# Patient Record
Sex: Male | Born: 1940
Health system: Southern US, Community
[De-identification: ages and names within clinical notes are randomized; demographics above are authoritative.]

## PROBLEM LIST (undated history)

## (undated) DIAGNOSIS — N2889 Other specified disorders of kidney and ureter: Secondary | ICD-10-CM

## (undated) DIAGNOSIS — E669 Obesity, unspecified: Secondary | ICD-10-CM

## (undated) DIAGNOSIS — N289 Disorder of kidney and ureter, unspecified: Secondary | ICD-10-CM

## (undated) DIAGNOSIS — M1990 Unspecified osteoarthritis, unspecified site: Secondary | ICD-10-CM

## (undated) DIAGNOSIS — I10 Essential (primary) hypertension: Secondary | ICD-10-CM

## (undated) DIAGNOSIS — R519 Headache, unspecified: Secondary | ICD-10-CM

## (undated) DIAGNOSIS — M359 Systemic involvement of connective tissue, unspecified: Secondary | ICD-10-CM

## (undated) DIAGNOSIS — R972 Elevated prostate specific antigen [PSA]: Secondary | ICD-10-CM

## (undated) DIAGNOSIS — G629 Polyneuropathy, unspecified: Secondary | ICD-10-CM

## (undated) DIAGNOSIS — C649 Malignant neoplasm of unspecified kidney, except renal pelvis: Secondary | ICD-10-CM

## (undated) DIAGNOSIS — E119 Type 2 diabetes mellitus without complications: Secondary | ICD-10-CM

## (undated) DIAGNOSIS — Z9889 Other specified postprocedural states: Secondary | ICD-10-CM

## (undated) DIAGNOSIS — E785 Hyperlipidemia, unspecified: Secondary | ICD-10-CM

## (undated) DIAGNOSIS — R319 Hematuria, unspecified: Secondary | ICD-10-CM

## (undated) DIAGNOSIS — R112 Nausea with vomiting, unspecified: Secondary | ICD-10-CM

## (undated) DIAGNOSIS — R51 Headache: Secondary | ICD-10-CM

## (undated) HISTORY — DX: Essential (primary) hypertension: I10

## (undated) HISTORY — PX: APPENDECTOMY: SHX54

## (undated) HISTORY — DX: Obesity, unspecified: E66.9

## (undated) HISTORY — DX: Unspecified osteoarthritis, unspecified site: M19.90

## (undated) HISTORY — DX: Elevated prostate specific antigen (PSA): R97.20

## (undated) HISTORY — DX: Malignant neoplasm of unspecified kidney, except renal pelvis: C64.9

## (undated) HISTORY — DX: Hematuria, unspecified: R31.9

## (undated) HISTORY — DX: Hyperlipidemia, unspecified: E78.5

## (undated) HISTORY — DX: Type 2 diabetes mellitus without complications: E11.9

---

## 2008-04-20 ENCOUNTER — Ambulatory Visit: Payer: Self-pay | Admitting: Gastroenterology

## 2013-12-20 ENCOUNTER — Ambulatory Visit: Payer: Self-pay | Admitting: Obstetrics and Gynecology

## 2014-06-23 ENCOUNTER — Ambulatory Visit: Payer: Self-pay | Admitting: Urology

## 2014-06-23 DIAGNOSIS — N2889 Other specified disorders of kidney and ureter: Secondary | ICD-10-CM | POA: Diagnosis not present

## 2014-06-23 DIAGNOSIS — K5730 Diverticulosis of large intestine without perforation or abscess without bleeding: Secondary | ICD-10-CM | POA: Diagnosis not present

## 2014-06-23 DIAGNOSIS — I700 Atherosclerosis of aorta: Secondary | ICD-10-CM | POA: Diagnosis not present

## 2014-06-28 DIAGNOSIS — N400 Enlarged prostate without lower urinary tract symptoms: Secondary | ICD-10-CM | POA: Diagnosis not present

## 2014-06-28 DIAGNOSIS — R972 Elevated prostate specific antigen [PSA]: Secondary | ICD-10-CM | POA: Diagnosis not present

## 2014-06-28 DIAGNOSIS — E669 Obesity, unspecified: Secondary | ICD-10-CM | POA: Diagnosis not present

## 2014-06-28 DIAGNOSIS — N281 Cyst of kidney, acquired: Secondary | ICD-10-CM | POA: Diagnosis not present

## 2014-08-18 DIAGNOSIS — I10 Essential (primary) hypertension: Secondary | ICD-10-CM | POA: Diagnosis not present

## 2014-08-18 DIAGNOSIS — E785 Hyperlipidemia, unspecified: Secondary | ICD-10-CM | POA: Diagnosis not present

## 2014-08-18 DIAGNOSIS — E1140 Type 2 diabetes mellitus with diabetic neuropathy, unspecified: Secondary | ICD-10-CM | POA: Diagnosis not present

## 2014-10-24 ENCOUNTER — Other Ambulatory Visit: Payer: Self-pay | Admitting: Family Medicine

## 2014-10-24 DIAGNOSIS — N2889 Other specified disorders of kidney and ureter: Secondary | ICD-10-CM

## 2014-11-18 DIAGNOSIS — E1159 Type 2 diabetes mellitus with other circulatory complications: Secondary | ICD-10-CM | POA: Insufficient documentation

## 2014-11-18 DIAGNOSIS — E0821 Diabetes mellitus due to underlying condition with diabetic nephropathy: Secondary | ICD-10-CM | POA: Insufficient documentation

## 2014-11-18 DIAGNOSIS — E1140 Type 2 diabetes mellitus with diabetic neuropathy, unspecified: Secondary | ICD-10-CM | POA: Insufficient documentation

## 2014-11-18 DIAGNOSIS — E1169 Type 2 diabetes mellitus with other specified complication: Secondary | ICD-10-CM | POA: Insufficient documentation

## 2014-11-18 DIAGNOSIS — E785 Hyperlipidemia, unspecified: Secondary | ICD-10-CM | POA: Insufficient documentation

## 2014-11-18 DIAGNOSIS — I10 Essential (primary) hypertension: Secondary | ICD-10-CM | POA: Insufficient documentation

## 2014-11-21 ENCOUNTER — Encounter: Payer: Self-pay | Admitting: Family Medicine

## 2014-11-21 ENCOUNTER — Ambulatory Visit (INDEPENDENT_AMBULATORY_CARE_PROVIDER_SITE_OTHER): Payer: Medicare Other | Admitting: Family Medicine

## 2014-11-21 VITALS — BP 157/77 | HR 64 | Temp 98.6°F | Ht 68.0 in | Wt 230.0 lb

## 2014-11-21 DIAGNOSIS — E119 Type 2 diabetes mellitus without complications: Secondary | ICD-10-CM

## 2014-11-21 DIAGNOSIS — I10 Essential (primary) hypertension: Secondary | ICD-10-CM

## 2014-11-21 DIAGNOSIS — E785 Hyperlipidemia, unspecified: Secondary | ICD-10-CM | POA: Diagnosis not present

## 2014-11-21 DIAGNOSIS — Z0000 Encounter for general adult medical examination without abnormal findings: Secondary | ICD-10-CM | POA: Diagnosis not present

## 2014-11-21 MED ORDER — AMLODIPINE BESYLATE 5 MG PO TABS: 5.0000 mg | ORAL_TABLET | Freq: Every day | ORAL | Status: DC

## 2014-11-21 NOTE — Assessment & Plan Note (Signed)
The current medical regimen is effective;  continue present plan and medications.  

## 2014-11-21 NOTE — Progress Notes (Signed)
BP 157/77 mmHg  Pulse 64  Temp(Src) 98.6 F (37 C)  Ht 5\' 8"  (1.727 m)  Wt 230 lb (104.327 kg)  BMI 34.98 kg/m2  SpO2 97%   Subjective:    Patient ID: Jason Contreras, male    DOB: 01-Apr-1941, 74 y.o.   MRN: [id number]  HPI: Jason Contreras is a 74 y.o. male  Chief Complaint  Patient presents with  . Annual Exam   Patient taking medications daily with no side effects and low blood sugar spells. Lipitor with no side effects or problems. Has been battling with poison ivy on his legs for the last 3 weeks may be getting better. Been taking blood pressure medicine every day on has not been checking at home. Gave up soft drinks and is feeling much better with more energy. Diabetic peripheral neuropathy well controlled with gabapentin.  Relevant past medical, surgical, family and social history reviewed and updated as indicated. Interim medical history since our last visit reviewed. Allergies and medications reviewed and updated.  Review of Systems  Constitutional: Negative.   HENT: Negative.   Eyes: Negative.   Respiratory: Negative.   Cardiovascular: Negative.   Endocrine: Negative.   Musculoskeletal: Negative.   Skin: Negative.   Allergic/Immunologic: Negative.   Neurological: Negative.   Hematological: Negative.   Psychiatric/Behavioral: Negative.     Per HPI unless specifically indicated above     Objective:    BP 157/77 mmHg  Pulse 64  Temp(Src) 98.6 F (37 C)  Ht 5\' 8"  (1.727 m)  Wt 230 lb (104.327 kg)  BMI 34.98 kg/m2  SpO2 97%  Wt Readings from Last 3 Encounters:  11/21/14 230 lb (104.327 kg)  08/18/14 236 lb (107.049 kg)    Physical Exam  Constitutional: He is oriented to person, place, and time. He appears well-developed and well-nourished.  HENT:  Head: Normocephalic.  Right Ear: External ear normal.  Left Ear: External ear normal.  Nose: Nose normal.  Eyes: Conjunctivae and EOM are normal. Pupils are equal, round, and reactive to light.  Neck:  Normal range of motion. Neck supple. No thyromegaly present.  Cardiovascular: Normal rate, regular rhythm, normal heart sounds and intact distal pulses.   Pulmonary/Chest: Effort normal and breath sounds normal.  Abdominal: Soft. Bowel sounds are normal. There is no splenomegaly or hepatomegaly.  Genitourinary:  Patient going to urology and has had urology workup. Still has ongoing workup for kidney tumor.  Musculoskeletal: Normal range of motion.  Lymphadenopathy:    He has no cervical adenopathy.  Neurological: He is alert and oriented to person, place, and time. He has normal reflexes.  Skin: Skin is warm and dry.  Changes on legs chest arms from poison ivy  Psychiatric: He has a normal mood and affect. His behavior is normal. Judgment and thought content normal.    No results found for this or any previous visit.    Assessment & Plan:   Problem List Items Addressed This Visit      Cardiovascular and Mediastinum   Hypertension - Primary    Discuss elevated blood pressure for control both here and at home. Will add amlodipine 5 mg discussed potential side effects with leg edema recheck blood pressure 1 month or so      Relevant Medications   amLODipine (NORVASC) 5 MG tablet   Other Relevant Orders   Basic metabolic panel     Endocrine   Diabetes mellitus without complication    The current medical regimen is effective;  continue present plan and medications.       Relevant Orders   Hemoglobin A1c   Bayer DCA Hb A1c Waived     Other   Hyperlipidemia    ...........Marland KitchenMarland KitchenThe current medical regimen is effective;  continue present plan and medications.       Relevant Medications   amLODipine (NORVASC) 5 MG tablet    Other Visit Diagnoses    PE (physical exam), annual        Relevant Orders    Comprehensive metabolic panel    CBC with Differential/Platelet    Urinalysis, Routine w reflex microscopic (not at [facility])    TSH    Lipid panel        Follow up  plan: Return in about 3 months (around 02/21/2015), or if symptoms worsen or fail to improve, for med check.

## 2014-11-21 NOTE — Assessment & Plan Note (Signed)
Discuss elevated blood pressure for control both here and at home. Will add amlodipine 5 mg discussed potential side effects with leg edema recheck blood pressure 1 month or so

## 2014-11-21 NOTE — Addendum Note (Signed)
Addended byGolden Pop on: 11/21/2014 04:39 PM   Modules accepted: Level of Service, SmartSet

## 2014-11-22 LAB — CBC WITH DIFFERENTIAL/PLATELET
BASOS ABS: 0 10*3/uL (ref 0.0–0.2)
Basos: 0 %
EOS (ABSOLUTE): 0.5 10*3/uL — ABNORMAL HIGH (ref 0.0–0.4)
Eos: 7 %
Hematocrit: 45.2 % (ref 37.5–51.0)
Hemoglobin: 14.9 g/dL (ref 12.6–17.7)
Immature Grans (Abs): 0 10*3/uL (ref 0.0–0.1)
Immature Granulocytes: 0 %
LYMPHS ABS: 1.9 10*3/uL (ref 0.7–3.1)
Lymphs: 28 %
MCH: 31.3 pg (ref 26.6–33.0)
MCHC: 33 g/dL (ref 31.5–35.7)
MCV: 95 fL (ref 79–97)
MONOCYTES: 9 %
Monocytes Absolute: 0.6 10*3/uL (ref 0.1–0.9)
Neutrophils Absolute: 3.8 10*3/uL (ref 1.4–7.0)
Neutrophils: 56 %
Platelets: 247 10*3/uL (ref 150–379)
RBC: 4.76 x10E6/uL (ref 4.14–5.80)
RDW: 13.8 % (ref 12.3–15.4)
WBC: 6.8 10*3/uL (ref 3.4–10.8)

## 2014-11-22 LAB — COMPREHENSIVE METABOLIC PANEL
ALK PHOS: 63 IU/L (ref 39–117)
ALT: 11 IU/L (ref 0–44)
AST: 15 IU/L (ref 0–40)
Albumin/Globulin Ratio: 1.7 (ref 1.1–2.5)
Albumin: 4.1 g/dL (ref 3.5–4.8)
BUN/Creatinine Ratio: 18 (ref 10–22)
BUN: 16 mg/dL (ref 8–27)
Bilirubin Total: 0.6 mg/dL (ref 0.0–1.2)
CALCIUM: 9.3 mg/dL (ref 8.6–10.2)
CO2: 23 mmol/L (ref 18–29)
Chloride: 100 mmol/L (ref 97–108)
Creatinine, Ser: 0.89 mg/dL (ref 0.76–1.27)
GFR calc Af Amer: 99 mL/min/{1.73_m2} (ref >59)
GFR calc non Af Amer: 86 mL/min/{1.73_m2} (ref >59)
Globulin, Total: 2.4 g/dL (ref 1.5–4.5)
Glucose: 100 mg/dL — ABNORMAL HIGH (ref 65–99)
POTASSIUM: 4.7 mmol/L (ref 3.5–5.2)
SODIUM: 144 mmol/L (ref 134–144)
TOTAL PROTEIN: 6.5 g/dL (ref 6.0–8.5)

## 2014-11-22 LAB — MICROSCOPIC EXAMINATION: RBC MICROSCOPIC, UA: NONE SEEN /HPF (ref 0–?)

## 2014-11-22 LAB — LIPID PANEL
Chol/HDL Ratio: 3.6 ratio units (ref 0.0–5.0)
Cholesterol, Total: 130 mg/dL (ref 100–199)
HDL: 36 mg/dL — AB (ref >39)
LDL Calculated: 57 mg/dL (ref 0–99)
Triglycerides: 183 mg/dL — ABNORMAL HIGH (ref 0–149)
VLDL Cholesterol Cal: 37 mg/dL (ref 5–40)

## 2014-11-22 LAB — URINALYSIS, ROUTINE W REFLEX MICROSCOPIC
BILIRUBIN UA: NEGATIVE
GLUCOSE, UA: NEGATIVE
Ketones, UA: NEGATIVE
NITRITE UA: NEGATIVE
Protein, UA: NEGATIVE
RBC, UA: NEGATIVE
Specific Gravity, UA: 1.015 (ref 1.005–1.030)
Urobilinogen, Ur: 1 mg/dL (ref 0.2–1.0)
pH, UA: 5.5 (ref 5.0–7.5)

## 2014-11-22 LAB — TSH: TSH: 3.79 u[IU]/mL (ref 0.450–4.500)

## 2014-11-22 LAB — BAYER DCA HB A1C WAIVED: HB A1C (BAYER DCA - WAIVED): 6.2 % (ref <7.0)

## 2014-12-21 ENCOUNTER — Encounter: Payer: Self-pay | Admitting: *Deleted

## 2014-12-26 ENCOUNTER — Ambulatory Visit
Admission: RE | Admit: 2014-12-26 | Discharge: 2014-12-26 | Disposition: A | Discharge status: Home / Self Care | Payer: Medicare Other | Source: Ambulatory Visit | Attending: Urology | Admitting: Urology

## 2014-12-26 DIAGNOSIS — N400 Enlarged prostate without lower urinary tract symptoms: Secondary | ICD-10-CM | POA: Insufficient documentation

## 2014-12-26 DIAGNOSIS — N2889 Other specified disorders of kidney and ureter: Secondary | ICD-10-CM

## 2014-12-26 DIAGNOSIS — N289 Disorder of kidney and ureter, unspecified: Secondary | ICD-10-CM | POA: Diagnosis not present

## 2014-12-26 MED ORDER — IOHEXOL 350 MG/ML SOLN
100.0000 mL | Freq: Once | INTRAVENOUS | Status: AC | PRN
  Administered 2014-12-26: 100 mL via INTRAVENOUS

## 2014-12-29 ENCOUNTER — Encounter: Payer: Self-pay | Admitting: Urology

## 2014-12-29 ENCOUNTER — Ambulatory Visit (INDEPENDENT_AMBULATORY_CARE_PROVIDER_SITE_OTHER): Payer: Medicare Other | Admitting: Urology

## 2014-12-29 VITALS — BP 144/75 | HR 60 | Ht 69.0 in | Wt 230.5 lb

## 2014-12-29 DIAGNOSIS — R972 Elevated prostate specific antigen [PSA]: Secondary | ICD-10-CM

## 2014-12-29 DIAGNOSIS — N401 Enlarged prostate with lower urinary tract symptoms: Secondary | ICD-10-CM | POA: Diagnosis not present

## 2014-12-29 DIAGNOSIS — N2889 Other specified disorders of kidney and ureter: Secondary | ICD-10-CM

## 2014-12-29 DIAGNOSIS — N138 Other obstructive and reflux uropathy: Secondary | ICD-10-CM

## 2014-12-29 DIAGNOSIS — N400 Enlarged prostate without lower urinary tract symptoms: Secondary | ICD-10-CM | POA: Diagnosis not present

## 2014-12-29 LAB — BLADDER SCAN AMB NON-IMAGING: Scan Result: 82

## 2014-12-29 NOTE — Progress Notes (Signed)
12/29/2014 4:28 PM   Jason Contreras 10-05-1940 [id number]  Referring provider: [name], MD [address] [place], [place] [address]  Chief Complaint  Patient presents with  . Elevated PSA     month follow up and CT results  . Benign Prostatic Hypertrophy    HPI: Jason Contreras is a 74 year old white male with a history of elevated PSA, a right renal mass and BPH with LUTS who presents today for a follow up visit.     Elevated PSA Patient was found to have a PSA of 4.5 ng/mL on 10/19/2013. A 4K score conducted on 12/15/2013 noted a 22% probability of having a Gleason's 7 or higher prostate cancer. His most current PSA was 3.9 ng/mL on 06/28/2014.  He is found to have an enlarged prostate on DRE, but no nodules have been palpated.  Right renal mass Patient has a slow growing right anterior interpolar lesion that most likely is representing an indolent growth of renal cell carcinoma. It is less than 7 cm in size.  He has not had any flank pain or gross hematuria. He has not had any weight loss or appetite loss.    BPH WITH LUTS His IPSS score today is 4, which is mild lower urinary tract symptomatology. He is mostly satisfied with his quality life due to his urinary symptoms. His PVR is 82 mL.  His previous PVR is 21 mL.    His major complaint today frequent urination and nocturia 1.  He has had these symptoms for many years.  He denies any dysuria, hematuria or suprapubic pain.      He also denies any recent fevers, chills, nausea or vomiting.  He has a family history of PCa, with father having been diagnosed with PCa.       IPSS      12/29/14 1600       International Prostate Symptom Score   How often have you had the sensation of not emptying your bladder? Not at All     How often have you had to urinate less than every two hours? Less than 1 in 5 times     How often have you found you stopped and started again several times when you urinated? Not at All     How  often have you found it difficult to postpone urination? Not at All     How often have you had a weak urinary stream? Less than 1 in 5 times     How often have you had to strain to start urination? Not at All     How many times did you typically get up at night to urinate? 2 Times     Total IPSS Score 4     Quality of Life due to urinary symptoms   If you were to spend the rest of your life with your urinary condition just the way it is now how would you feel about that? Mostly Satisfied        Score:  1-7 Mild 8-19 Moderate 20-35 Severe     PMH: Past Medical History  Diagnosis Date  . Hypertension   . Hyperlipidemia   . Adenocarcinoma, renal cell   . Arthritis   . Obesity   . BPH (benign prostatic hyperplasia)   . Elevated PSA   . Hematuria   . Diabetes mellitus without complication     Takes Metformin.    Surgical History: Past Surgical History  Procedure Laterality  Date  . Appendectomy      Home Medications:    Medication List       This list is accurate as of: 12/29/14  4:28 PM.  Always use your most recent med list.               amLODipine 5 MG tablet  Commonly known as:  NORVASC  Take 1 tablet (5 mg total) by mouth daily.     aspirin EC 81 MG tablet  Take 81 mg by mouth daily.     atorvastatin 20 MG tablet  Commonly known as:  LIPITOR  Take 20 mg by mouth daily.     carvedilol 12.5 MG tablet  Commonly known as:  COREG  Take 12.5 mg by mouth 2 (two) times daily with a meal.     gabapentin 300 MG capsule  Commonly known as:  NEURONTIN  Take 300 mg by mouth 4 (four) times daily.     glimepiride 4 MG tablet  Commonly known as:  AMARYL  Take 2 mg by mouth daily with breakfast.     losartan 100 MG tablet  Commonly known as:  COZAAR  Take 100 mg by mouth daily.     metFORMIN 500 MG tablet  Commonly known as:  GLUCOPHAGE  Take 1,000 mg by mouth 2 (two) times daily with a meal.     mometasone 0.1 % cream  Commonly known as:  ELOCON    Apply 1 application topically daily.     pioglitazone 45 MG tablet  Commonly known as:  ACTOS  Take 45 mg by mouth daily.        Allergies: No Known Allergies  Family History: Family History  Problem Relation Age of Onset  . Cancer Mother     BONE  . Cancer Father     PROSTATE  . Stroke Brother   . Kidney disease Neg Hx     Social History:  reports that he has never smoked. He has never used smokeless tobacco. He reports that he does not drink alcohol or use illicit drugs.  ROS: UROLOGY Frequent Urination?: Yes Hard to postpone urination?: No Burning/pain with urination?: No Get up at night to urinate?: Yes Leakage of urine?: No Urine stream starts and stops?: No Trouble starting stream?: No Do you have to strain to urinate?: No Blood in urine?: No Urinary tract infection?: No Sexually transmitted disease?: No Injury to kidneys or bladder?: No Painful intercourse?: No Weak stream?: No Erection problems?: Yes Penile pain?: No  Gastrointestinal Nausea?: No Vomiting?: No Indigestion/heartburn?: No Diarrhea?: No Constipation?: No  Constitutional Fever: No Night sweats?: No Weight loss?: No Fatigue?: No  Skin Skin rash/lesions?: Yes Itching?: No  Eyes Blurred vision?: No Double vision?: No  Ears/Nose/Throat Sore throat?: No Sinus problems?: No  Hematologic/Lymphatic Swollen glands?: No Easy bruising?: Yes  Cardiovascular Leg swelling?: No Chest pain?: No  Respiratory Cough?: No Shortness of breath?: No  Endocrine Excessive thirst?: No  Musculoskeletal Back pain?: Yes Joint pain?: No  Neurological Headaches?: No Dizziness?: No  Psychologic Depression?: No Anxiety?: No  Physical Exam: BP 144/75 mmHg  Pulse 60  Ht 5\' 9"  (1.753 m)  Wt 230 lb 8 oz (104.554 kg)  BMI 34.02 kg/m2  GU: Patient with uncircumcised phallus.  Foreskin easily retracted  Urethral meatus is patent.  No penile discharge. No penile lesions or rashes.  Scrotum without lesions, cysts, rashes and/or edema.  Testicles are located scrotally bilaterally. No masses are appreciated in the testicles.  Left and right epididymis are normal. Rectal: Patient with  normal sphincter tone. Perineum without scarring or rashes. No rectal masses are appreciated. Prostate is approximately 70 grams, no nodules are appreciated. Seminal vesicles are normal.   Laboratory Data: Lab Results  Component Value Date   WBC 6.8 11/21/2014   HCT 45.2 11/21/2014    Lab Results  Component Value Date   CREATININE 0.89 11/21/2014    PSA history:      4.5 ng/dL on 10/19/2013.      4K score of 22% on 12/15/2013      3.9 ng/mL on 06/28/2014  Urinalysis    Component Value Date/Time   GLUCOSEU Negative 11/21/2014 1523   BILIRUBINUR Negative 11/21/2014 1523   NITRITE Negative 11/21/2014 1523   LEUKOCYTESUR Trace* 11/21/2014 1523    Pertinent Imaging: CLINICAL DATA: Intermittent right flank discomfort for 6 months. Follow-up right renal mass.  EXAM: CT ABDOMEN AND PELVIS WITHOUT AND WITH CONTRAST  TECHNIQUE: Multidetector CT imaging of the abdomen and pelvis was performed following the standard protocol before and following the bolus administration of intravenous contrast.  CONTRAST: 133mL OMNIPAQUE IOHEXOL 350 MG/ML SOLN  COMPARISON: 06/23/2014 and 12/20/2013.  FINDINGS: Lower chest: Lung bases show no acute findings. Heart size normal. Coronary artery calcification. No pericardial or pleural effusion.  Hepatobiliary: Liver and gallbladder are unremarkable. No biliary ductal dilatation.  Pancreas: Negative.  Spleen: Splenule, negative.  Adrenals/Urinary Tract: Adrenal glands are unremarkable. No urinary stones. An enhancing lesion in the anterior interpolar right kidney measures approximately 1.8 cm (previously 1.6 x 1.6 cm on 12/20/2013). Kidneys are otherwise unremarkable. Ureters are decompressed. Bladder is  unremarkable.  Stomach/Bowel: Stomach, small bowel, appendix and colon are unremarkable.  Vascular/Lymphatic: Atherosclerotic calcification of the arterial vasculature without abdominal aortic aneurysm. No pathologically enlarged lymph nodes.  Reproductive: Prostate is mildly enlarged.  Other: No free fluid. Mesenteries and peritoneum are unremarkable.  Musculoskeletal: No worrisome lytic or sclerotic lesions. Probable dominant in the right iliac wing. Degenerative changes are seen in the spine.  IMPRESSION: 1. No findings to explain the patient's right flank discomfort. 2. Likely indolent growth of an enhancing right renal mass, most indicative of renal cell carcinoma. 3. Mild prostate enlargement.   Electronically Signed  By: Lorin Picket M.D.  On: 12/26/2014 11:18   Assessment & Plan:    1. Right renal mass:    Patient was found to have a 1.8 cm enhancing lesion in the anterior interpolar right kidney on the CT scan completed on 12/26/2014. It had previously been 1.61.6 cm on the CT scan completed 12/20/2013.   CT scan reviewed with patient today in clinic.  I reminded him of his options concerning the mass, as discussed with Dr. Erlene Quan at his visit August 2015,  that generally a lesion of this size with these characteristics has ~80% chance of representing a renal cell carcinoma.   He could continue surveillance of small lesion with serial imaging or undergo biopsy vs. surgical resection. vs. RFA/cyro. Generally these tumors due grow slowly and rarely metastasize when <3 cm, however, this cannot be guaranteed.  Given his multiple medical issues as well as person preference, he would prefer to repeat CT abd/ pelvis in 6 months to assess for growth/ change.  We will repeat his CT scan of the abdomen and pelvis with and without to assess for growth and change in 6 months.  2. Elevated PSA:    Patient was found to have a PSA of 4.5 ng/mL on 10/19/2013. A  4K  score conducted on 12/15/2013 noted a 22% probability of having a Gleason's 7 or higher prostate cancer. His most current PSA was 3.9 ng/mL on 06/28/2014.  He is found to have an enlarged prostate on DRE, but no nodules have been palpated.    - PSA  3. BPH (benign prostatic hyperplasia) with LUTS:   IPSS 4/2.  PVR is 82 mL.  We will continue to monitor the patient. He will return in 6 months for IPSS score and symptom recheck.   No Follow-up on file. Zara Council, [facility] [address] [place], [place] [address] [phone]

## 2014-12-30 LAB — PSA: Prostate Specific Ag, Serum: 3.9 ng/mL (ref 0.0–4.0)

## 2015-01-07 DIAGNOSIS — N138 Other obstructive and reflux uropathy: Secondary | ICD-10-CM | POA: Insufficient documentation

## 2015-01-07 DIAGNOSIS — N401 Enlarged prostate with lower urinary tract symptoms: Secondary | ICD-10-CM

## 2015-01-07 DIAGNOSIS — N2889 Other specified disorders of kidney and ureter: Secondary | ICD-10-CM | POA: Insufficient documentation

## 2015-01-07 DIAGNOSIS — R972 Elevated prostate specific antigen [PSA]: Secondary | ICD-10-CM | POA: Insufficient documentation

## 2015-01-10 ENCOUNTER — Telehealth: Payer: Self-pay | Admitting: *Deleted

## 2015-01-10 NOTE — Telephone Encounter (Signed)
-----   Message from Nori Riis, PA-C sent at 01/09/2015  7:41 PM EDT ----- Patient's PSA is stable.  We will see him in 6 months.  PSA to be drawn before his next appointment.

## 2015-01-10 NOTE — Telephone Encounter (Signed)
I spoke w/the patient and relayed their lab results.  The pt indicated understanding and had no questions. 

## 2015-02-22 ENCOUNTER — Ambulatory Visit: Payer: Medicare Other | Admitting: Family Medicine

## 2015-03-13 ENCOUNTER — Encounter: Payer: Self-pay | Admitting: Family Medicine

## 2015-03-13 ENCOUNTER — Ambulatory Visit (INDEPENDENT_AMBULATORY_CARE_PROVIDER_SITE_OTHER): Payer: Medicare Other | Admitting: Family Medicine

## 2015-03-13 VITALS — BP 116/72 | HR 60 | Temp 98.6°F | Ht 69.0 in | Wt 232.0 lb

## 2015-03-13 DIAGNOSIS — Z23 Encounter for immunization: Secondary | ICD-10-CM | POA: Diagnosis not present

## 2015-03-13 DIAGNOSIS — E119 Type 2 diabetes mellitus without complications: Secondary | ICD-10-CM

## 2015-03-13 DIAGNOSIS — I10 Essential (primary) hypertension: Secondary | ICD-10-CM

## 2015-03-13 DIAGNOSIS — E785 Hyperlipidemia, unspecified: Secondary | ICD-10-CM | POA: Diagnosis not present

## 2015-03-13 LAB — MICROALBUMIN, URINE WAIVED
Creatinine, Urine Waived: 100 mg/dL (ref 10–300)
Microalb, Ur Waived: 30 mg/L — ABNORMAL HIGH (ref 0–19)
Microalb/Creat Ratio: <30 mg/g (ref <30)

## 2015-03-13 LAB — BAYER DCA HB A1C WAIVED: HB A1C (BAYER DCA - WAIVED): 6.6 % (ref <7.0)

## 2015-03-13 NOTE — Progress Notes (Signed)
BP 116/72 mmHg  Pulse 60  Temp(Src) 98.6 F (37 C)  Ht 5\' 9"  (1.753 m)  Wt 232 lb (105.235 kg)  BMI 34.24 kg/m2  SpO2 99%   Subjective:    Patient ID: Jason Contreras, male    DOB: Jan 08, 1941, 74 y.o.   MRN: [id number]  HPI: Jason Contreras is a 74 y.o. male  Chief Complaint  Patient presents with  . Diabetes  . Hypertension   patient for follow-up diabetes hypertension hypercholesterol doing well with medications no side effects noted low blood sugar spells taking medications faithfully has gained a few pounds over this last summer and fall. 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 Tylenol helps some. Also reviewed renal mass and plan from nephrology and urology to repeat imaging in February   Relevant past medical, surgical, family and social history reviewed and updated as indicated. Interim medical history since our last visit reviewed. Allergies and medications reviewed and updated.  Review of Systems  Constitutional: Negative.   Respiratory: Negative.   Cardiovascular: Negative.     Per HPI unless specifically indicated above     Objective:    BP 116/72 mmHg  Pulse 60  Temp(Src) 98.6 F (37 C)  Ht 5\' 9"  (1.753 m)  Wt 232 lb (105.235 kg)  BMI 34.24 kg/m2  SpO2 99%  Wt Readings from Last 3 Encounters:  03/13/15 232 lb (105.235 kg)  12/29/14 230 lb 8 oz (104.554 kg)  11/21/14 230 lb (104.327 kg)    Physical Exam  Constitutional: He is oriented to person, place, and time. He appears well-developed and well-nourished. No distress.  HENT:  Head: Normocephalic and atraumatic.  Right Ear: Hearing normal.  Left Ear: Hearing normal.  Nose: Nose normal.  Eyes: Conjunctivae and lids are normal. Right eye exhibits no discharge. Left eye exhibits no discharge. No scleral icterus.  Cardiovascular: Normal rate, regular rhythm and normal  heart sounds.   Pulmonary/Chest: Effort normal and breath sounds normal. No respiratory distress.  Musculoskeletal: Normal range of motion.  Neurological: He is alert and oriented to person, place, and time.  Skin: Skin is intact. No rash noted.  Psychiatric: He has a normal mood and affect. His speech is normal and behavior is normal. Judgment and thought content normal. Cognition and memory are normal.    Results for orders placed or performed in visit on 12/29/14  PSA  Result Value Ref Range   Prostate Specific Ag, Serum 3.9 0.0 - 4.0 ng/mL  BLADDER SCAN AMB NON-IMAGING  Result Value Ref Range   Scan Result 82       Assessment & Plan:   Problem List Items Addressed This Visit      Cardiovascular and Mediastinum   Hypertension    The current medical regimen is effective;  continue present plan and medications.       Relevant Orders   Bayer DCA Hb A1c Waived   Microalbumin, Urine Waived     Endocrine   Diabetes mellitus without complication ([place]) - Primary    The current medical regimen is effective;  continue present plan and medications.       Relevant Orders   Bayer DCA Hb A1c Waived   Microalbumin, Urine Waived     Other   Hyperlipidemia    The current medical regimen is effective;  continue present plan and medications.  Other Visit Diagnoses    Immunization due        Relevant Orders    Flu Vaccine QUAD 36+ mos PF IM (Fluarix & Fluzone Quad PF) (Completed)        Follow up plan: Return in about 3 months (around 06/13/2015), or if symptoms worsen or fail to improve, for a1c, lipids, alt, ast, bmp.

## 2015-03-13 NOTE — Assessment & Plan Note (Signed)
The current medical regimen is effective;  continue present plan and medications.  

## 2015-03-14 ENCOUNTER — Encounter: Payer: Self-pay | Admitting: Family Medicine

## 2015-03-14 LAB — BASIC METABOLIC PANEL
BUN / CREAT RATIO: 20 (ref 10–22)
BUN: 17 mg/dL (ref 8–27)
CALCIUM: 9.1 mg/dL (ref 8.6–10.2)
CO2: 25 mmol/L (ref 18–29)
Chloride: 102 mmol/L (ref 97–106)
Creatinine, Ser: 0.87 mg/dL (ref 0.76–1.27)
GFR, EST AFRICAN AMERICAN: 100 mL/min/{1.73_m2} (ref >59)
GFR, EST NON AFRICAN AMERICAN: 86 mL/min/{1.73_m2} (ref >59)
Glucose: 140 mg/dL — ABNORMAL HIGH (ref 65–99)
POTASSIUM: 4.9 mmol/L (ref 3.5–5.2)
Sodium: 141 mmol/L (ref 136–144)

## 2015-04-23 ENCOUNTER — Other Ambulatory Visit: Payer: Self-pay | Admitting: Family Medicine

## 2015-05-16 ENCOUNTER — Other Ambulatory Visit: Payer: Self-pay | Admitting: Family Medicine

## 2015-05-28 ENCOUNTER — Other Ambulatory Visit: Payer: Self-pay | Admitting: Family Medicine

## 2015-05-28 NOTE — Telephone Encounter (Signed)
Last LDL and ALT November 21, 2014 reviewed; Rx approved

## 2015-06-22 ENCOUNTER — Ambulatory Visit
Admission: RE | Admit: 2015-06-22 | Discharge: 2015-06-22 | Disposition: A | Discharge status: Home / Self Care | Payer: Medicare Other | Source: Ambulatory Visit | Attending: Urology | Admitting: Urology

## 2015-06-22 DIAGNOSIS — K5730 Diverticulosis of large intestine without perforation or abscess without bleeding: Secondary | ICD-10-CM | POA: Diagnosis not present

## 2015-06-22 DIAGNOSIS — K760 Fatty (change of) liver, not elsewhere classified: Secondary | ICD-10-CM | POA: Diagnosis not present

## 2015-06-22 DIAGNOSIS — N400 Enlarged prostate without lower urinary tract symptoms: Secondary | ICD-10-CM | POA: Insufficient documentation

## 2015-06-22 DIAGNOSIS — N2889 Other specified disorders of kidney and ureter: Secondary | ICD-10-CM | POA: Insufficient documentation

## 2015-06-22 LAB — POCT I-STAT CREATININE: Creatinine, Ser: 0.9 mg/dL (ref 0.61–1.24)

## 2015-06-22 MED ORDER — IOHEXOL 350 MG/ML SOLN
100.0000 mL | Freq: Once | INTRAVENOUS | Status: AC | PRN
Start: 2015-06-22 — End: 2015-06-22
  Administered 2015-06-22: 100 mL via INTRAVENOUS

## 2015-06-26 ENCOUNTER — Encounter: Payer: Self-pay | Admitting: Urology

## 2015-06-26 ENCOUNTER — Ambulatory Visit (INDEPENDENT_AMBULATORY_CARE_PROVIDER_SITE_OTHER): Payer: Medicare Other | Admitting: Urology

## 2015-06-26 VITALS — BP 178/78 | HR 61 | Ht 69.5 in | Wt 231.5 lb

## 2015-06-26 DIAGNOSIS — N138 Other obstructive and reflux uropathy: Secondary | ICD-10-CM

## 2015-06-26 DIAGNOSIS — R972 Elevated prostate specific antigen [PSA]: Secondary | ICD-10-CM

## 2015-06-26 DIAGNOSIS — N2889 Other specified disorders of kidney and ureter: Secondary | ICD-10-CM | POA: Diagnosis not present

## 2015-06-26 DIAGNOSIS — N400 Enlarged prostate without lower urinary tract symptoms: Secondary | ICD-10-CM | POA: Diagnosis not present

## 2015-06-26 DIAGNOSIS — N401 Enlarged prostate with lower urinary tract symptoms: Secondary | ICD-10-CM | POA: Diagnosis not present

## 2015-06-26 LAB — URINALYSIS, COMPLETE
Bilirubin, UA: NEGATIVE
Glucose, UA: NEGATIVE
Ketones, UA: NEGATIVE
LEUKOCYTES UA: NEGATIVE
Nitrite, UA: NEGATIVE
PH UA: 6 (ref 5.0–7.5)
PROTEIN UA: NEGATIVE
RBC UA: NEGATIVE
Specific Gravity, UA: 1.025 (ref 1.005–1.030)
UUROB: 2 mg/dL — AB (ref 0.2–1.0)

## 2015-06-26 LAB — MICROSCOPIC EXAMINATION: RBC, UA: NONE SEEN /hpf (ref 0–?)

## 2015-06-26 NOTE — Progress Notes (Signed)
12:28 PM   Jason Contreras [birth year] [id number]  Referring provider: [name], MD [address] [place], [place] [address]  Chief Complaint  Patient presents with  . Renal mass    Follow up CT results    HPI: Jason Contreras is a 75 year old white male with a history of elevated PSA, a right renal mass and BPH with LUTS who presents today for a follow up visit.     Elevated PSA Patient was found to have a PSA of 4.5 ng/mL on 10/19/2013. A 4K score conducted on 12/15/2013 noted a 22% probability of having a Gleason's 7 or higher prostate cancer. His most current PSA was 3.9 ng/mL on 12/29/2014.  He is found to have an enlarged prostate on DRE, but no nodules have been palpated.  Right renal mass Patient has a slow growing right anterior interpolar lesion that most likely is representing an indolent growth of renal cell carcinoma. It has now increased from 1.6 cm x 1.6 cm to 2.2 cm x 2.1 cm in size.     He has not had any flank pain or gross hematuria. He has not had any weight loss or appetite loss.  We had been following the renal mass due to patient's medical issues and preference.    BPH WITH LUTS His IPSS score today is 7, which is mild lower urinary tract symptomatology. He is mostly satisfied with his quality life due to his urinary symptoms.  His major complaint today frequent urination and nocturia 1.  He has had these symptoms for many years.  He denies any dysuria, hematuria or suprapubic pain.  He also denies any recent fevers, chills, nausea or vomiting.  He has a family history of PCa, with father having been diagnosed with PCa.       IPSS      06/26/15 1300       International Prostate Symptom Score   How often have you had the sensation of not emptying your bladder? Not at All     How often have you had to urinate less than every two hours? Less than half the time     How often have you found you stopped and started again several times when you urinated? Less  than half the time     How often have you found it difficult to postpone urination? Not at All     How often have you had a weak urinary stream? Less than 1 in 5 times     How often have you had to strain to start urination? Not at All     How many times did you typically get up at night to urinate? 2 Times     Total IPSS Score 7     Quality of Life due to urinary symptoms   If you were to spend the rest of your life with your urinary condition just the way it is now how would you feel about that? Mostly Satisfied        Score:  1-7 Mild 8-19 Moderate 20-35 Severe     PMH: Past Medical History  Diagnosis Date  . Hypertension   . Hyperlipidemia   . Arthritis   . Obesity   . Elevated PSA   . Hematuria   . Adenocarcinoma, renal cell [facility])     Surgical History: Past Surgical History  Procedure Laterality Date  . Appendectomy      Home Medications:    Medication  List       This list is accurate as of: 06/26/15 11:59 PM.  Always use your most recent med list.               amLODipine 5 MG tablet  Commonly known as:  NORVASC  Take 1 tablet (5 mg total) by mouth daily.     atorvastatin 20 MG tablet  Commonly known as:  LIPITOR  TAKE ONE TABLET BY MOUTH ONCE DAILY     carvedilol 12.5 MG tablet  Commonly known as:  COREG  TAKE ONE TABLET BY MOUTH TWICE DAILY     gabapentin 300 MG capsule  Commonly known as:  NEURONTIN  Take 300 mg by mouth 4 (four) times daily.     glimepiride 4 MG tablet  Commonly known as:  AMARYL  TAKE ONE-HALF TABLET BY MOUTH ONCE DAILY     losartan 100 MG tablet  Commonly known as:  COZAAR  TAKE ONE TABLET BY MOUTH ONCE DAILY     metFORMIN 500 MG tablet  Commonly known as:  GLUCOPHAGE  TAKE TWO TABLETS BY MOUTH TWICE DAILY     mometasone 0.1 % cream  Commonly known as:  ELOCON  Apply 1 application topically daily.     pioglitazone 45 MG tablet  Commonly known as:  ACTOS  Take 45 mg by mouth daily.        Allergies: No  Known Allergies  Family History: Family History  Problem Relation Age of Onset  . Cancer Mother     BONE  . Cancer Father     PROSTATE  . Stroke Brother   . Kidney disease Neg Hx     Social History:  reports that he has never smoked. He has never used smokeless tobacco. He reports that he does not drink alcohol or use illicit drugs.  ROS: UROLOGY Frequent Urination?: No Hard to postpone urination?: No Burning/pain with urination?: No Get up at night to urinate?: No Leakage of urine?: No Urine stream starts and stops?: No Trouble starting stream?: No Do you have to strain to urinate?: No Blood in urine?: No Urinary tract infection?: No Sexually transmitted disease?: No Injury to kidneys or bladder?: No Painful intercourse?: No Weak stream?: No Erection problems?: No Penile pain?: No  Gastrointestinal Nausea?: No Vomiting?: No Indigestion/heartburn?: No Diarrhea?: No Constipation?: No  Constitutional Fever: No Night sweats?: No Weight loss?: No Fatigue?: No  Skin Skin rash/lesions?: Yes Itching?: No  Eyes Blurred vision?: No Double vision?: No  Ears/Nose/Throat Sore throat?: No Sinus problems?: No  Hematologic/Lymphatic Swollen glands?: No Easy bruising?: No  Cardiovascular Leg swelling?: No Chest pain?: No  Respiratory Cough?: No Shortness of breath?: No  Endocrine Excessive thirst?: No  Musculoskeletal Back pain?: Yes Joint pain?: No  Neurological Headaches?: No Dizziness?: No  Psychologic Depression?: No Anxiety?: No  Physical Exam: BP 178/78 mmHg  Pulse 61  Ht 5' 9.5" (1.765 m)  Wt 231 lb 8 oz (105.008 kg)  BMI 33.71 kg/m2  GU: Patient with uncircumcised phallus.  Foreskin easily retracted  Urethral meatus is patent.  No penile discharge. No penile lesions or rashes. Scrotum without lesions, cysts, rashes and/or edema.  Testicles are located scrotally bilaterally. No masses are appreciated in the testicles. Left and right  epididymis are normal. Rectal: Patient with  normal sphincter tone. Perineum without scarring or rashes. No rectal masses are appreciated. Prostate is approximately 70 grams, no nodules are appreciated. Seminal vesicles are normal.   Laboratory Data: Lab Results  Component Value Date   WBC 6.8 11/21/2014   HCT 45.2 11/21/2014   MCV 95 11/21/2014   PLT 247 11/21/2014    Lab Results  Component Value Date   CREATININE 0.91 06/27/2015    PSA history:      4.5 ng/dL on 10/19/2013.      4K score of 22% on 12/15/2013      3.9 ng/mL on 06/28/2014      3.9 ng/mL on 12/29/2014  Pertinent imaging CLINICAL DATA: Followup right renal mass.  EXAM: CT ABDOMEN AND PELVIS WITHOUT AND WITH CONTRAST  TECHNIQUE: Multidetector CT imaging of the abdomen and pelvis was performed following the standard protocol before and following the bolus administration of intravenous contrast.  CONTRAST: 170mL OMNIPAQUE IOHEXOL 350 MG/ML SOLN  COMPARISON: 12/26/2014 and 06/23/2014  FINDINGS: Lower chest: No acute findings.  Hepatobiliary: Mild diffuse hepatic steatosis noted. No liver masses are identified. Gallbladder is unremarkable.  Pancreas: No mass, inflammatory changes, or other significant abnormality.  Spleen: Within normal limits in size and appearance.  Adrenals/Urinary Tract: No adrenal masses identified. Small mass in the anterior midpole the right kidney which shows evidence of contrast enhancement is seen on image 71/series 5. This shows mild increase in size, currently measuring 2.2 x 2.1 cm compared to 1.6 x 1.6 cm previously. This is suspicious for a small renal cell carcinoma. No other renal masses identified. No evidence hydronephrosis. Lower urinary tract including urinary bladder are unremarkable in appearance.  Stomach/Bowel: No evidence of obstruction, inflammatory process, or abnormal fluid collections. Colonic diverticulosis is noted, however there is  no evidence of diverticulitis.  Vascular/Lymphatic: No pathologically enlarged lymph nodes. No evidence of abdominal aortic aneurysm.  Reproductive: Mildly enlarged prostate remains stable.  Other: None.  Musculoskeletal: No suspicious bone lesions identified.  IMPRESSION: Mild enlargement of 2.2 cm enhancing mass in anterior midpole of right kidney, highly suspicious for renal cell carcinoma. Consider abdomen MRI without and with contrast for further characterization.  No evidence of abdominal or pelvic metastatic disease.  Incidental findings including mildly enlarged prostate, colonic diverticulosis, and mild hepatic steatosis.   Electronically Signed  By: Earle Gell M.D.  On: 06/22/2015 16:13   Urinalysis Results for orders placed or performed in visit on 06/26/15  Microscopic Examination  Result Value Ref Range   WBC, UA 0-5 0 -  5 /hpf   RBC, UA None seen 0 -  2 /hpf   Epithelial Cells (non renal) 0-10 0 - 10 /hpf   Mucus, UA Present (A) Not Estab.   Bacteria, UA Few (A) None seen/Few  Urinalysis, Complete  Result Value Ref Range   Specific Gravity, UA 1.025 1.005 - 1.030   pH, UA 6.0 5.0 - 7.5   Color, UA Yellow Yellow   Appearance Ur Clear Clear   Leukocytes, UA Negative Negative   Protein, UA Negative Negative/Trace   Glucose, UA Negative Negative   Ketones, UA Negative Negative   RBC, UA Negative Negative   Bilirubin, UA Negative Negative   Urobilinogen, Ur 2.0 (H) 0.2 - 1.0 mg/dL   Nitrite, UA Negative Negative   Microscopic Examination See below:      Assessment & Plan:    1. Right renal mass:    Patient's renal mass has increased from 1.61.6 cm to 2.22.1 cm in the last 6 months.  This is a significant increase in size.  He will have an appointment with Dr. Erlene Quan to discuss further management of the renal mass.  2. Elevated PSA:  Patient was found to have a PSA of 4.5 ng/mL on 10/19/2013. A 4K score conducted on 12/15/2013  noted a 22% probability of having a Gleason's 7 or higher prostate cancer. His most current PSA was 3.9 ng/mL on 12/29/2014.  He is found to have an enlarged prostate on DRE, but no nodules have been palpated.  As long as patient's PSA remains at baseline, he would like to continue close monitoring on a six-month basis.  - PSA  3. BPH (benign prostatic hyperplasia) with LUTS:   IPSS 7/2.   We will continue to monitor the patient. He will return in 6 months for IPSS score and symptom recheck.   Return for appointment with Dr. Erlene Quan to discuss his renal mass. Royden Purl  [facility] [address], [place], [place] [address] [phone]  Addendum:   PSA has risen from 3.9 six months ago to 5.8 on 06/26/2015.  He will need his PSA repeated when he returns for his office visit with Dr. Erlene Quan to confirm this is a true elevation.

## 2015-06-27 ENCOUNTER — Encounter: Payer: Self-pay | Admitting: Family Medicine

## 2015-06-27 ENCOUNTER — Ambulatory Visit (INDEPENDENT_AMBULATORY_CARE_PROVIDER_SITE_OTHER): Payer: Medicare Other | Admitting: Family Medicine

## 2015-06-27 VITALS — BP 138/77 | HR 54 | Temp 97.6°F | Ht 68.6 in | Wt 228.0 lb

## 2015-06-27 DIAGNOSIS — E119 Type 2 diabetes mellitus without complications: Secondary | ICD-10-CM

## 2015-06-27 DIAGNOSIS — I10 Essential (primary) hypertension: Secondary | ICD-10-CM

## 2015-06-27 DIAGNOSIS — E785 Hyperlipidemia, unspecified: Secondary | ICD-10-CM

## 2015-06-27 LAB — LP+ALT+AST PICCOLO, WAIVED
ALT (SGPT) Piccolo, Waived: 19 U/L (ref 10–47)
AST (SGOT) Piccolo, Waived: 21 U/L (ref 11–38)
CHOLESTEROL PICCOLO, WAIVED: 185 mg/dL (ref <200)
Chol/HDL Ratio Piccolo,Waive: 3.6 mg/dL
HDL CHOL PICCOLO, WAIVED: 52 mg/dL — AB (ref >59)
LDL Chol Calc Piccolo Waived: 101 mg/dL — ABNORMAL HIGH (ref <100)
TRIGLYCERIDES PICCOLO,WAIVED: 163 mg/dL — AB (ref <150)
VLDL Chol Calc Piccolo,Waive: 33 mg/dL — ABNORMAL HIGH (ref <30)

## 2015-06-27 LAB — BAYER DCA HB A1C WAIVED: HB A1C: 6.4 % (ref <7.0)

## 2015-06-27 LAB — PSA: Prostate Specific Ag, Serum: 5.8 ng/mL — ABNORMAL HIGH (ref 0.0–4.0)

## 2015-06-27 MED ORDER — GLIMEPIRIDE 4 MG PO TABS: 2.0000 mg | ORAL_TABLET | Freq: Every day | ORAL | Status: DC

## 2015-06-27 MED ORDER — CARVEDILOL 12.5 MG PO TABS
12.5000 mg | ORAL_TABLET | Freq: Two times a day (BID) | ORAL | Status: DC
Start: 2015-06-27 — End: 2015-09-07

## 2015-06-27 MED ORDER — GABAPENTIN 300 MG PO CAPS: 300.0000 mg | ORAL_CAPSULE | Freq: Four times a day (QID) | ORAL | Status: DC

## 2015-06-27 MED ORDER — ATORVASTATIN CALCIUM 40 MG PO TABS: 40.0000 mg | ORAL_TABLET | Freq: Every day | ORAL | Status: DC

## 2015-06-27 MED ORDER — LOSARTAN POTASSIUM 100 MG PO TABS: 100.0000 mg | ORAL_TABLET | Freq: Every day | ORAL | Status: DC

## 2015-06-27 MED ORDER — PIOGLITAZONE HCL 45 MG PO TABS: 45.0000 mg | ORAL_TABLET | Freq: Every day | ORAL | Status: DC

## 2015-06-27 MED ORDER — METFORMIN HCL 500 MG PO TABS: 1000.0000 mg | ORAL_TABLET | Freq: Two times a day (BID) | ORAL | Status: DC

## 2015-06-27 NOTE — Assessment & Plan Note (Signed)
Discussed cholesterol poor control will increase Lipitor

## 2015-06-27 NOTE — Assessment & Plan Note (Signed)
Good control patient will stop double Actos and continue other medications

## 2015-06-27 NOTE — Assessment & Plan Note (Signed)
Doing stable off of amlodipine will continue off amlodipine

## 2015-06-27 NOTE — Progress Notes (Signed)
BP 138/77 mmHg  Pulse 54  Temp(Src) 97.6 F (36.4 C)  Ht 5' 8.6" (1.742 m)  Wt 228 lb (103.42 kg)  BMI 34.08 kg/m2  SpO2 98%   Subjective:    Patient ID: Jason Contreras, male    DOB: 01/20/1941, 75 y.o.   MRN: [id number]  HPI: Jason Contreras is a 75 y.o. male  Chief Complaint  Patient presents with  . Diabetes    patient has been taking double Actos  . Hypertension    patient has not been taking Amlodipine (does not remember it)  . Hyperlipidemia   patient follow-up doing well taking double Actos due to mistakes but not having any side effects fluid retention etc. no low blood sugar spells Blood pressures been doing well not taking amlodipine Otherwise taking medications without side effects  Relevant past medical, surgical, family and social history reviewed and updated as indicated. Interim medical history since our last visit reviewed. Allergies and medications reviewed and updated.  Review of Systems  Constitutional: Negative.   Respiratory: Negative.   Cardiovascular: Negative.     Per HPI unless specifically indicated above     Objective:    BP 138/77 mmHg  Pulse 54  Temp(Src) 97.6 F (36.4 C)  Ht 5' 8.6" (1.742 m)  Wt 228 lb (103.42 kg)  BMI 34.08 kg/m2  SpO2 98%  Wt Readings from Last 3 Encounters:  06/27/15 228 lb (103.42 kg)  06/26/15 231 lb 8 oz (105.008 kg)  03/13/15 232 lb (105.235 kg)    Physical Exam  Constitutional: He is oriented to person, place, and time. He appears well-developed and well-nourished. No distress.  HENT:  Head: Normocephalic and atraumatic.  Right Ear: Hearing normal.  Left Ear: Hearing normal.  Nose: Nose normal.  Eyes: Conjunctivae and lids are normal. Right eye exhibits no discharge. Left eye exhibits no discharge. No scleral icterus.  Cardiovascular: Normal rate, regular rhythm and normal heart sounds.   Pulmonary/Chest: Effort normal and breath sounds normal. No respiratory distress.  Musculoskeletal: Normal  range of motion.  Neurological: He is alert and oriented to person, place, and time.  Skin: Skin is intact. No rash noted.  Psychiatric: He has a normal mood and affect. His speech is normal and behavior is normal. Judgment and thought content normal. Cognition and memory are normal.    Results for orders placed or performed in visit on 06/26/15  Microscopic Examination  Result Value Ref Range   WBC, UA 0-5 0 -  5 /hpf   RBC, UA None seen 0 -  2 /hpf   Epithelial Cells (non renal) 0-10 0 - 10 /hpf   Mucus, UA Present (A) Not Estab.   Bacteria, UA Few (A) None seen/Few  Urinalysis, Complete  Result Value Ref Range   Specific Gravity, UA 1.025 1.005 - 1.030   pH, UA 6.0 5.0 - 7.5   Color, UA Yellow Yellow   Appearance Ur Clear Clear   Leukocytes, UA Negative Negative   Protein, UA Negative Negative/Trace   Glucose, UA Negative Negative   Ketones, UA Negative Negative   RBC, UA Negative Negative   Bilirubin, UA Negative Negative   Urobilinogen, Ur 2.0 (H) 0.2 - 1.0 mg/dL   Nitrite, UA Negative Negative   Microscopic Examination See below:   PSA  Result Value Ref Range   Prostate Specific Ag, Serum 5.8 (H) 0.0 - 4.0 ng/mL      Assessment & Plan:   Problem List Items Addressed This  Visit      Cardiovascular and Mediastinum   Hypertension    Doing stable off of amlodipine will continue off amlodipine      Relevant Medications   losartan (COZAAR) 100 MG tablet   carvedilol (COREG) 12.5 MG tablet   atorvastatin (LIPITOR) 40 MG tablet   Other Relevant Orders   Bayer DCA Hb A1c Waived   LP+ALT+AST Piccolo, Waived   Basic metabolic panel     Endocrine   Diabetes mellitus without complication (HCC) - Primary    Good control patient will stop double Actos and continue other medications      Relevant Medications   metFORMIN (GLUCOPHAGE) 500 MG tablet   pioglitazone (ACTOS) 45 MG tablet   losartan (COZAAR) 100 MG tablet   glimepiride (AMARYL) 4 MG tablet   atorvastatin  (LIPITOR) 40 MG tablet   Other Relevant Orders   Bayer DCA Hb A1c Waived   LP+ALT+AST Piccolo, Waived   Basic metabolic panel     Other   Hyperlipidemia    Discussed cholesterol poor control will increase Lipitor      Relevant Medications   losartan (COZAAR) 100 MG tablet   carvedilol (COREG) 12.5 MG tablet   atorvastatin (LIPITOR) 40 MG tablet   Other Relevant Orders   Bayer DCA Hb A1c Waived   LP+ALT+AST Piccolo, Waived   Basic metabolic panel       Follow up plan: Return in about 3 months (around 09/24/2015) for a1c lipids alt, ast.

## 2015-06-28 ENCOUNTER — Encounter: Payer: Self-pay | Admitting: Family Medicine

## 2015-06-28 ENCOUNTER — Telehealth: Payer: Self-pay

## 2015-06-28 DIAGNOSIS — R972 Elevated prostate specific antigen [PSA]: Secondary | ICD-10-CM

## 2015-06-28 LAB — BASIC METABOLIC PANEL
BUN / CREAT RATIO: 14 (ref 10–22)
BUN: 13 mg/dL (ref 8–27)
CO2: 25 mmol/L (ref 18–29)
CREATININE: 0.91 mg/dL (ref 0.76–1.27)
Calcium: 9.4 mg/dL (ref 8.6–10.2)
Chloride: 100 mmol/L (ref 96–106)
GFR calc non Af Amer: 84 mL/min/{1.73_m2} (ref >59)
GFR, EST AFRICAN AMERICAN: 97 mL/min/{1.73_m2} (ref >59)
GLUCOSE: 133 mg/dL — AB (ref 65–99)
Potassium: 4.5 mmol/L (ref 3.5–5.2)
SODIUM: 140 mmol/L (ref 134–144)

## 2015-06-28 NOTE — Telephone Encounter (Signed)
-----   Message from Nori Riis, PA-C sent at 06/28/2015  3:59 PM EST ----- Patient's PSA has risen significantly.  He is having an upcoming appointment with Dr. Erlene Quan to discuss his renal mass. He should have his PSA repeated at that appointment to ensure this is a true elevation and not laboratory error.

## 2015-06-28 NOTE — Telephone Encounter (Signed)
Spoke with pt in reference to PSA. Pt voiced understanding. Orders placed.

## 2015-06-30 ENCOUNTER — Encounter: Payer: Self-pay | Admitting: Urology

## 2015-06-30 ENCOUNTER — Telehealth: Payer: Self-pay | Admitting: Family Medicine

## 2015-06-30 ENCOUNTER — Ambulatory Visit (INDEPENDENT_AMBULATORY_CARE_PROVIDER_SITE_OTHER): Payer: Medicare Other | Admitting: Urology

## 2015-06-30 VITALS — BP 149/89 | HR 74 | Ht 68.0 in | Wt 226.9 lb

## 2015-06-30 DIAGNOSIS — N2889 Other specified disorders of kidney and ureter: Secondary | ICD-10-CM | POA: Diagnosis not present

## 2015-06-30 DIAGNOSIS — R972 Elevated prostate specific antigen [PSA]: Secondary | ICD-10-CM

## 2015-06-30 DIAGNOSIS — N138 Other obstructive and reflux uropathy: Secondary | ICD-10-CM

## 2015-06-30 DIAGNOSIS — N401 Enlarged prostate with lower urinary tract symptoms: Secondary | ICD-10-CM | POA: Diagnosis not present

## 2015-06-30 NOTE — Telephone Encounter (Signed)
Patient daughter called and stated that patient got bad news about his kidneys and is concern about his reaction and wants to known if Dr. Jeananne Rama can call him in something to help him with anxiety, nerviness and scared. Pharmacy is [facility] on [address].

## 2015-07-01 LAB — PSA: Prostate Specific Ag, Serum: 6 ng/mL — ABNORMAL HIGH (ref 0.0–4.0)

## 2015-07-02 ENCOUNTER — Encounter: Payer: Self-pay | Admitting: Urology

## 2015-07-02 ENCOUNTER — Telehealth: Payer: Self-pay | Admitting: Urology

## 2015-07-02 NOTE — Progress Notes (Signed)
3:25 PM  06/20/15  Jason Contreras [birth year] [id number]  Referring provider: [name], MD [address] [place], [place] [address]  Chief Complaint  Patient presents with  . Mass    Renal mass, follow up    HPI: Mr. Speyrer is a 75 year old male with a history of elevated PSA, a right renal mass and BPH with LUTS who presents to review renal mass and discuss options.  He presents to the office today accompanied by his wife.    Elevated PSA Patient was found to have a PSA of 4.5 ng/mL on 10/19/2013. A 4K score conducted on 12/15/2013 noted a 22% probability of having a Gleason's 7 or higher prostate cancer. PSA was 3.9 ng/mL on 12/29/2014, 5.8 on 06/26/15 and repeat today pending .  He is found to have an enlarged prostate on DRE, but no nodules have been palpated.    He has a family history of PCa, with father having been diagnosed with PCa.   Right renal mass Patient has a slow growing right anterior interpolar lesion that most likely is representing an indolent growth of renal cell carcinoma. It has now increased from 1.6 cm x 1.6 cm to 2.2 cm x 2.1 cm in size on the anterior right midpole, enhancing on CT scan.    He has not had any flank pain or gross hematuria. He has not had any weight loss or appetite loss.  Previous abdominal incision include open appendectomy.  BPH WITH LUTS Most recent IPSS score 7, which is mild lower urinary tract symptomatology. He is mostly satisfied with his quality life due to his urinary symptoms.  His major complaint today frequent urination and nocturia 1.  He has had these symptoms for many years.  He denies any dysuria, hematuria or suprapubic pain.  He also denies any recent fevers, chills, nausea or vomiting.   PMH: Past Medical History  Diagnosis Date  . Hypertension   . Hyperlipidemia   . Arthritis   . Obesity   . Elevated PSA   . Hematuria   . Adenocarcinoma, renal cell [facility])     Surgical History: Past Surgical History    Procedure Laterality Date  . Appendectomy      Home Medications:    Medication List       This list is accurate as of: 06/30/15 11:59 PM.  Always use your most recent med list.               amLODipine 5 MG tablet  Commonly known as:  NORVASC  Take 1 tablet (5 mg total) by mouth daily.     atorvastatin 40 MG tablet  Commonly known as:  LIPITOR  Take 1 tablet (40 mg total) by mouth daily.     carvedilol 12.5 MG tablet  Commonly known as:  COREG  Take 1 tablet (12.5 mg total) by mouth 2 (two) times daily.     gabapentin 300 MG capsule  Commonly known as:  NEURONTIN  Take 1 capsule (300 mg total) by mouth 4 (four) times daily.     glimepiride 4 MG tablet  Commonly known as:  AMARYL  Take 0.5 tablets (2 mg total) by mouth daily.     losartan 100 MG tablet  Commonly known as:  COZAAR  Take 1 tablet (100 mg total) by mouth daily.     metFORMIN 500 MG tablet  Commonly known as:  GLUCOPHAGE  Take 2 tablets (1,000 mg total) by mouth  2 (two) times daily.     mometasone 0.1 % cream  Commonly known as:  ELOCON  Apply 1 application topically daily.     pioglitazone 45 MG tablet  Commonly known as:  ACTOS  Take 1 tablet (45 mg total) by mouth daily.        Allergies: No Known Allergies  Family History: Family History  Problem Relation Age of Onset  . Cancer Mother     BONE  . Cancer Father     PROSTATE  . Stroke Brother   . Kidney disease Neg Hx     Social History:  reports that he has never smoked. He has never used smokeless tobacco. He reports that he does not drink alcohol or use illicit drugs.   Physical Exam: BP 149/89 mmHg  Pulse 74  Ht 5\' 8"  (1.727 m)  Wt 226 lb 14.4 oz (102.921 kg)  BMI 34.51 kg/m2  NAD.  A&O x 3.  Anxious.  Wife present today.  Abd soft, ND, NT.  Well healed scar in RLQ.  Obese.     Laboratory Data: Lab Results  Component Value Date   WBC 6.8 11/21/2014   HCT 45.2 11/21/2014   MCV 95 11/21/2014   PLT 247 11/21/2014     Lab Results  Component Value Date   CREATININE 0.91 06/27/2015    PSA history:      4.5 ng/dL on 10/19/2013.      4K score of 22% on 12/15/2013      3.9 ng/mL on 06/28/2014      3.9 ng/mL on 12/29/2014      4.8 ng/Dl on 06/26/15        Pertinent imaging CLINICAL DATA: Followup right renal mass.  EXAM: CT ABDOMEN AND PELVIS WITHOUT AND WITH CONTRAST  TECHNIQUE: Multidetector CT imaging of the abdomen and pelvis was performed following the standard protocol before and following the bolus administration of intravenous contrast.  CONTRAST: 122mL OMNIPAQUE IOHEXOL 350 MG/ML SOLN  COMPARISON: 12/26/2014 and 06/23/2014  FINDINGS: Lower chest: No acute findings.  Hepatobiliary: Mild diffuse hepatic steatosis noted. No liver masses are identified. Gallbladder is unremarkable.  Pancreas: No mass, inflammatory changes, or other significant abnormality.  Spleen: Within normal limits in size and appearance.  Adrenals/Urinary Tract: No adrenal masses identified. Small mass in the anterior midpole the right kidney which shows evidence of contrast enhancement is seen on image 71/series 5. This shows mild increase in size, currently measuring 2.2 x 2.1 cm compared to 1.6 x 1.6 cm previously. This is suspicious for a small renal cell carcinoma. No other renal masses identified. No evidence hydronephrosis. Lower urinary tract including urinary bladder are unremarkable in appearance.  Stomach/Bowel: No evidence of obstruction, inflammatory process, or abnormal fluid collections. Colonic diverticulosis is noted, however there is no evidence of diverticulitis.  Vascular/Lymphatic: No pathologically enlarged lymph nodes. No evidence of abdominal aortic aneurysm.  Reproductive: Mildly enlarged prostate remains stable.  Other: None.  Musculoskeletal: No suspicious bone lesions identified.  IMPRESSION: Mild enlargement of 2.2 cm enhancing mass in anterior  midpole of right kidney, highly suspicious for renal cell carcinoma. Consider abdomen MRI without and with contrast for further characterization.  No evidence of abdominal or pelvic metastatic disease.  Incidental findings including mildly enlarged prostate, colonic diverticulosis, and mild hepatic steatosis.   Electronically Signed  By: Earle Gell M.D.  On: 06/22/2015 16:13   CT scan personally reviewed today and with the patient.   Assessment & Plan:    1.  Right renal mass:    Patient's renal mass has increased from 1.61.6 cm to 2.22.1 cm, enhancing right anterior midpole mass concerning for renal cell carcinoma. Given the 0.5 cm increase in size of the past 6 months, I do feel that its now prudent to proceed with some sort of intervention. We discussed various options today including right robotic partial nephrectomy, open partial nephrectomy, radical nephrectomy, RFA versus cryotherapy of the lesion along with continued surveillance. We also briefly discussed biopsy of the renal mass today. Given his history of diabetes, I would recommend a nephron sparing surgery in order to maximize his renal function. I do feel that this mass is amenable to robotic partial nephrectomy. We also briefly discussed ablative therapy stay as well infeasibility is to be determined. We discussed the preoperative, operative, and postoperative experience. Risks of partial nephrectomy were discussed in detail including risk of significant and potentially life-threatening bleeding, infection, damage to surrounding structures, AV malformation with delayed bleeding, and urinary leak. We also discussed injury to surrounding structures as well.  He is most likely interested in proceeding with right robotic partial nephrectomy. He was offered a referral to interventional radiology to discuss ablative therapy but declined. He is quite anxious about the whole situation and recorded our conversation today to  discuss with his daughter who lives out of town. He is very anxious because he has booked [facility] which has been planned for more than 10 years and also have a graduation in May that like to attend out of town.    He would like to consider his options further over the course of the next few weeks. He would like to return in 2 weeks to continue this discussion after reading a bit more and discussing with his daughter.  2. Elevated PSA:    History of elevated PSA. PSA repeated today. We'll discuss his results at his next follow-up visit in 2 weeks. - PSA  3. BPH (benign prostatic hyperplasia) with LUTS:   IPSS 7/2.   We will continue to monitor the patient. He will return in 6 months for IPSS score and symptom recheck.   Return in about 2 weeks (around 07/14/2015) for see Dr. Erlene Quan. [name], MD  [facility] [address], [place], [place] [address] [phone]  I spent 30 min with this patient of which greater than 50% was spent in counseling and coordination of care with the patient.

## 2015-07-03 ENCOUNTER — Telehealth: Payer: Self-pay

## 2015-07-03 NOTE — Telephone Encounter (Signed)
Call pt 

## 2015-07-03 NOTE — Telephone Encounter (Signed)
-----   Message from [name], MD sent at 07/02/2015  3:59 PM EST ----- Regarding: PSA Please let Jason Contreras know that his most recent PSA was 6.0 which is slightly more elevated than before.  He is very anxious so please reassure him and tell him that we will discuss this further at his next visit.  More than likely, I will recommend either repeating this in 3 months or proceeding with prostate biopsy.  [name], MD

## 2015-07-03 NOTE — Telephone Encounter (Signed)
Spoke with pt in reference to PSA results. Reassured pt Dr. Erlene Quan will discuss further at f/u visit. Pt voiced understanding.

## 2015-07-04 ENCOUNTER — Telehealth: Payer: Self-pay

## 2015-07-04 NOTE — Telephone Encounter (Signed)
See previous note

## 2015-07-04 NOTE — Telephone Encounter (Signed)
-----   Message from Nori Riis, PA-C sent at 07/02/2015 10:56 PM EST ----- Patient is returning in 2 weeks for follow-up appointment with Dr. Erlene Quan to discuss his renal mass and elevated PSA.

## 2015-07-19 ENCOUNTER — Ambulatory Visit (INDEPENDENT_AMBULATORY_CARE_PROVIDER_SITE_OTHER): Payer: Medicare Other | Admitting: Urology

## 2015-07-19 VITALS — BP 152/74 | HR 66 | Ht 70.0 in | Wt 221.0 lb

## 2015-07-19 DIAGNOSIS — R972 Elevated prostate specific antigen [PSA]: Secondary | ICD-10-CM

## 2015-07-19 DIAGNOSIS — N2889 Other specified disorders of kidney and ureter: Secondary | ICD-10-CM | POA: Diagnosis not present

## 2015-07-19 DIAGNOSIS — N401 Enlarged prostate with lower urinary tract symptoms: Secondary | ICD-10-CM

## 2015-07-19 DIAGNOSIS — N138 Other obstructive and reflux uropathy: Secondary | ICD-10-CM

## 2015-07-24 ENCOUNTER — Encounter: Payer: Self-pay | Admitting: Urology

## 2015-07-24 NOTE — Progress Notes (Signed)
07/20/15  Jason Contreras [birth year] [id number]  Referring provider: [name], MD [address] [place], [place] [address]  Chief Complaint  Patient presents with  . Elevated PSA    HPI: Jason Contreras is a 75 year old male with a history of elevated PSA, a right renal mass and BPH with LUTS.  He returns to the office today to revisit how to proceed with his renal mass. This has been previously discussed but the patient was unable to decide how to proceed and became overwhelmed.  Elevated PSA Patient was found to have a PSA of 4.5 ng/mL on 10/19/2013. A 4K score conducted on 12/15/2013 noted a 22% probability of having a Gleason's 7 or higher prostate cancer. PSA was 3.9 ng/mL on 12/29/2014, 5.8 on 06/26/15 and 6.0 on 06/30/15.  He is found to have an enlarged prostate on DRE, but no nodules have been palpated.    He has a family history of PCa, with father having been diagnosed with PCa.   Right renal mass Patient has a slow growing right anterior interpolar lesion that most likely is representing an indolent growth of renal cell carcinoma. It has now increased from 1.6 cm x 1.6 cm to 2.2 cm x 2.1 cm in size on the anterior right midpole, enhancing on CT scan.    He has not had any flank pain or gross hematuria. He has not had any weight loss or appetite loss.  Previous abdominal incision include open appendectomy.  BPH WITH LUTS Most recent IPSS score 7, which is mild lower urinary tract symptomatology. He is mostly satisfied with his quality life due to his urinary symptoms.  His major complaint today frequent urination and nocturia 1.  He has had these symptoms for many years.  He denies any dysuria, hematuria or suprapubic pain.  He also denies any recent fevers, chills, nausea or vomiting.   PMH: Past Medical History  Diagnosis Date  . Hypertension   . Hyperlipidemia   . Arthritis   . Obesity   . Elevated PSA   . Hematuria   . Adenocarcinoma, renal cell [facility])      Surgical History: Past Surgical History  Procedure Laterality Date  . Appendectomy      Home Medications:    Medication List       This list is accurate as of: 07/19/15 11:59 PM.  Always use your most recent med list.               amLODipine 5 MG tablet  Commonly known as:  NORVASC  Take 1 tablet (5 mg total) by mouth daily.     atorvastatin 20 MG tablet  Commonly known as:  LIPITOR     carvedilol 12.5 MG tablet  Commonly known as:  COREG  Take 1 tablet (12.5 mg total) by mouth 2 (two) times daily.     gabapentin 300 MG capsule  Commonly known as:  NEURONTIN  Take 1 capsule (300 mg total) by mouth 4 (four) times daily.     glimepiride 4 MG tablet  Commonly known as:  AMARYL  Take 0.5 tablets (2 mg total) by mouth daily.     losartan 100 MG tablet  Commonly known as:  COZAAR  Take 1 tablet (100 mg total) by mouth daily.     metFORMIN 500 MG tablet  Commonly known as:  GLUCOPHAGE  Take 2 tablets (1,000 mg total) by mouth 2 (two) times daily.     mometasone  0.1 % cream  Commonly known as:  ELOCON  Apply 1 application topically daily.     pioglitazone 45 MG tablet  Commonly known as:  ACTOS  Take 1 tablet (45 mg total) by mouth daily.        Allergies: No Known Allergies  Family History: Family History  Problem Relation Age of Onset  . Cancer Mother     BONE  . Cancer Father     PROSTATE  . Stroke Brother   . Kidney disease Neg Hx     Social History:  reports that he has never smoked. He has never used smokeless tobacco. He reports that he does not drink alcohol or use illicit drugs.   Physical Exam: BP 152/74 mmHg  Pulse 66  Ht 5\' 10"  (1.778 m)  Wt 221 lb (100.245 kg)  BMI 31.71 kg/m2  NAD.  A&O x 3.  Anxious.  Wife present today.  Abd soft, ND, NT.  Obese.     Laboratory Data: Lab Results  Component Value Date   WBC 6.8 11/21/2014   HCT 45.2 11/21/2014   MCV 95 11/21/2014   PLT 247 11/21/2014    Lab Results  Component  Value Date   CREATININE 0.91 06/27/2015    PSA history:      4.5 ng/dL on 10/19/2013.      4K score of 22% on 12/15/2013      3.9 ng/mL on 06/28/2014      3.9 ng/mL on 12/29/2014      5.8 ng/Dl on 06/26/15       6.0 ng/dL on 06/30/15        Pertinent imaging CLINICAL DATA: Followup right renal mass.  EXAM: CT ABDOMEN AND PELVIS WITHOUT AND WITH CONTRAST  TECHNIQUE: Multidetector CT imaging of the abdomen and pelvis was performed following the standard protocol before and following the bolus administration of intravenous contrast.  CONTRAST: 132mL OMNIPAQUE IOHEXOL 350 MG/ML SOLN  COMPARISON: 12/26/2014 and 06/23/2014  FINDINGS: Lower chest: No acute findings.  Hepatobiliary: Mild diffuse hepatic steatosis noted. No liver masses are identified. Gallbladder is unremarkable.  Pancreas: No mass, inflammatory changes, or other significant abnormality.  Spleen: Within normal limits in size and appearance.  Adrenals/Urinary Tract: No adrenal masses identified. Small mass in the anterior midpole the right kidney which shows evidence of contrast enhancement is seen on image 71/series 5. This shows mild increase in size, currently measuring 2.2 x 2.1 cm compared to 1.6 x 1.6 cm previously. This is suspicious for a small renal cell carcinoma. No other renal masses identified. No evidence hydronephrosis. Lower urinary tract including urinary bladder are unremarkable in appearance.  Stomach/Bowel: No evidence of obstruction, inflammatory process, or abnormal fluid collections. Colonic diverticulosis is noted, however there is no evidence of diverticulitis.  Vascular/Lymphatic: No pathologically enlarged lymph nodes. No evidence of abdominal aortic aneurysm.  Reproductive: Mildly enlarged prostate remains stable.  Other: None.  Musculoskeletal: No suspicious bone lesions identified.  IMPRESSION: Mild enlargement of 2.2 cm enhancing mass in anterior  midpole of right kidney, highly suspicious for renal cell carcinoma. Consider abdomen MRI without and with contrast for further characterization.  No evidence of abdominal or pelvic metastatic disease.  Incidental findings including mildly enlarged prostate, colonic diverticulosis, and mild hepatic steatosis.   Electronically Signed  By: Earle Gell M.D.  On: 06/22/2015 16:13   CT scan personally reviewed today and with the patient.   Assessment & Plan:    1. Right renal mass:    Patient's  renal mass has increased from 1.61.6 cm to 2.22.1 cm, enhancing right anterior midpole mass concerning for renal cell carcinoma. we revisit his options today including continued surveillance, ablative therapy, and robotic partial nephrectomy. We again reviewed the risks and benefits of each modality. He has decided to proceed with robotic partial nephrectomy but will return in approximately 2 months to look his surgery. He would like to take his trip to [place] first which seems reasonable given the relatively slow growth rate. Importance of close follow-up was discussed.   2. Elevated PSA:     Most recent PSA 6.0. Overall, over the past few years since been steadily rising. I do recommend proceeding with repeat prostate biopsy.  Again, he does not want to do anything until he returns from his trip which is implanted for over a year. Plan to repeat his PSA in May and if remain stably elevated or higher, we will proceed with prostate biopsy prior to treating right renal mass. Patient is agreeable with this plan.  - PSA  3. BPH (benign prostatic hyperplasia) with LUTS:   IPSS 7/2.   We will continue to monitor the patient. He will return in 6 months for IPSS score and symptom recheck.   Return in about 2 months (around 09/18/2015) for PSA, discuss renal mass. [name], MD  [facility] [address] [place], [place] [address] [phone]  I spent 15  min with this patient of which greater than 50% was spent in counseling and coordination of care with the patient.

## 2015-07-27 NOTE — Telephone Encounter (Signed)
Note opened in error.

## 2015-09-07 ENCOUNTER — Ambulatory Visit (INDEPENDENT_AMBULATORY_CARE_PROVIDER_SITE_OTHER): Payer: Medicare Other | Admitting: Family Medicine

## 2015-09-07 ENCOUNTER — Encounter: Payer: Self-pay | Admitting: Family Medicine

## 2015-09-07 VITALS — BP 131/78 | HR 58 | Temp 98.1°F | Ht 70.0 in | Wt 225.0 lb

## 2015-09-07 DIAGNOSIS — I10 Essential (primary) hypertension: Secondary | ICD-10-CM

## 2015-09-07 DIAGNOSIS — E119 Type 2 diabetes mellitus without complications: Secondary | ICD-10-CM | POA: Diagnosis not present

## 2015-09-07 DIAGNOSIS — E785 Hyperlipidemia, unspecified: Secondary | ICD-10-CM | POA: Diagnosis not present

## 2015-09-07 LAB — LP+ALT+AST PICCOLO, WAIVED
ALT (SGPT) PICCOLO, WAIVED: 14 U/L (ref 10–47)
AST (SGOT) PICCOLO, WAIVED: 19 U/L (ref 11–38)
Chol/HDL Ratio Piccolo,Waive: 4.4 mg/dL
Cholesterol Piccolo, Waived: 185 mg/dL (ref <200)
HDL Chol Piccolo, Waived: 42 mg/dL — ABNORMAL LOW (ref >59)
LDL Chol Calc Piccolo Waived: 107 mg/dL — ABNORMAL HIGH (ref <100)
Triglycerides Piccolo,Waived: 181 mg/dL — ABNORMAL HIGH (ref <150)
VLDL Chol Calc Piccolo,Waive: 36 mg/dL — ABNORMAL HIGH (ref <30)

## 2015-09-07 LAB — BAYER DCA HB A1C WAIVED: HB A1C (BAYER DCA - WAIVED): 6 % (ref <7.0)

## 2015-09-07 MED ORDER — GABAPENTIN 300 MG PO CAPS: 300.0000 mg | ORAL_CAPSULE | Freq: Four times a day (QID) | ORAL | Status: DC

## 2015-09-07 MED ORDER — GLIMEPIRIDE 4 MG PO TABS: 2.0000 mg | ORAL_TABLET | Freq: Every day | ORAL | Status: DC

## 2015-09-07 MED ORDER — ATORVASTATIN CALCIUM 20 MG PO TABS: 20.0000 mg | ORAL_TABLET | Freq: Every day | ORAL | Status: DC

## 2015-09-07 MED ORDER — CARVEDILOL 12.5 MG PO TABS: 12.5000 mg | ORAL_TABLET | Freq: Two times a day (BID) | ORAL | Status: DC

## 2015-09-07 MED ORDER — PIOGLITAZONE HCL 45 MG PO TABS: 45.0000 mg | ORAL_TABLET | Freq: Every day | ORAL | Status: DC

## 2015-09-07 MED ORDER — AMLODIPINE BESYLATE 5 MG PO TABS: 5.0000 mg | ORAL_TABLET | Freq: Every day | ORAL | Status: DC

## 2015-09-07 MED ORDER — LOSARTAN POTASSIUM 100 MG PO TABS: 100.0000 mg | ORAL_TABLET | Freq: Every day | ORAL | Status: DC

## 2015-09-07 MED ORDER — METFORMIN HCL 500 MG PO TABS: 1000.0000 mg | ORAL_TABLET | Freq: Two times a day (BID) | ORAL | Status: DC

## 2015-09-07 NOTE — Progress Notes (Signed)
BP 131/78 mmHg  Pulse 58  Temp(Src) 98.1 F (36.7 C)  Ht 5\' 10"  (1.778 m)  Wt 225 lb (102.059 kg)  BMI 32.28 kg/m2  SpO2 99%   Subjective:    Patient ID: Jason Contreras, male    DOB: 12-13-1940, 75 y.o.   MRN: [id number]  HPI: Jason Contreras is a 75 y.o. male  Chief Complaint  Patient presents with  . Diabetes  . Hyperlipidemia  Recheck diabetes doing well noted low blood sugar spells no issues with medications taken faithfully. Cholesterol not sure is taking medications doesn't remember atorvastatin Otherwise doing well with medications blood pressure good control  Relevant past medical, surgical, family and social history reviewed and updated as indicated. Interim medical history since our last visit reviewed. Allergies and medications reviewed and updated.  Review of Systems  Constitutional: Negative.   Respiratory: Negative.   Cardiovascular: Negative.     Per HPI unless specifically indicated above     Objective:    BP 131/78 mmHg  Pulse 58  Temp(Src) 98.1 F (36.7 C)  Ht 5\' 10"  (1.778 m)  Wt 225 lb (102.059 kg)  BMI 32.28 kg/m2  SpO2 99%  Wt Readings from Last 3 Encounters:  09/07/15 225 lb (102.059 kg)  07/19/15 221 lb (100.245 kg)  06/30/15 226 lb 14.4 oz (102.921 kg)    Physical Exam  Constitutional: He is oriented to person, place, and time. He appears well-developed and well-nourished. No distress.  HENT:  Head: Normocephalic and atraumatic.  Right Ear: Hearing normal.  Left Ear: Hearing normal.  Nose: Nose normal.  Eyes: Conjunctivae and lids are normal. Right eye exhibits no discharge. Left eye exhibits no discharge. No scleral icterus.  Cardiovascular: Normal rate, regular rhythm and normal heart sounds.   Pulmonary/Chest: Effort normal and breath sounds normal. No respiratory distress.  Musculoskeletal: Normal range of motion.  Neurological: He is alert and oriented to person, place, and time.  Skin: Skin is intact. No rash noted.   Psychiatric: He has a normal mood and affect. His speech is normal and behavior is normal. Judgment and thought content normal. Cognition and memory are normal.    Results for orders placed or performed in visit on 06/30/15  PSA  Result Value Ref Range   Prostate Specific Ag, Serum 6.0 (H) 0.0 - 4.0 ng/mL      Assessment & Plan:   Problem List Items Addressed This Visit      Cardiovascular and Mediastinum   Hypertension    The current medical regimen is effective;  continue present plan and medications.       Relevant Medications   atorvastatin (LIPITOR) 20 MG tablet   amLODipine (NORVASC) 5 MG tablet   carvedilol (COREG) 12.5 MG tablet   losartan (COZAAR) 100 MG tablet     Endocrine   Diabetes mellitus without complication ([place]) - Primary    The current medical regimen is effective;  continue present plan and medications.       Relevant Medications   atorvastatin (LIPITOR) 20 MG tablet   glimepiride (AMARYL) 4 MG tablet   losartan (COZAAR) 100 MG tablet   metFORMIN (GLUCOPHAGE) 500 MG tablet   pioglitazone (ACTOS) 45 MG tablet   Other Relevant Orders   Bayer DCA Hb A1c Waived     Other   Hyperlipidemia    Discussed cholesterol patient's not taking atorvastatin thinks the prescription ran out we'll restart atorvastatin recheck lipids next visit.      Relevant Medications  atorvastatin (LIPITOR) 20 MG tablet   amLODipine (NORVASC) 5 MG tablet   carvedilol (COREG) 12.5 MG tablet   losartan (COZAAR) 100 MG tablet   Other Relevant Orders   LP+ALT+AST [place], Waived       Follow up plan: Return in about 3 months (around 12/08/2015) for Physical Exam.

## 2015-09-07 NOTE — Assessment & Plan Note (Signed)
The current medical regimen is effective;  continue present plan and medications.  

## 2015-09-07 NOTE — Assessment & Plan Note (Signed)
Discussed cholesterol patient's not taking atorvastatin thinks the prescription ran out we'll restart atorvastatin recheck lipids next visit.

## 2015-09-29 ENCOUNTER — Other Ambulatory Visit: Payer: Medicare Other

## 2015-09-29 DIAGNOSIS — R972 Elevated prostate specific antigen [PSA]: Secondary | ICD-10-CM

## 2015-09-30 LAB — PSA: PROSTATE SPECIFIC AG, SERUM: 5.6 ng/mL — AB (ref 0.0–4.0)

## 2015-10-03 ENCOUNTER — Ambulatory Visit: Payer: Medicare Other | Admitting: Urology

## 2015-10-04 ENCOUNTER — Ambulatory Visit (INDEPENDENT_AMBULATORY_CARE_PROVIDER_SITE_OTHER): Payer: Medicare Other | Admitting: Urology

## 2015-10-04 ENCOUNTER — Encounter: Payer: Self-pay | Admitting: Urology

## 2015-10-04 VITALS — BP 158/70 | HR 65 | Ht 69.0 in | Wt 223.0 lb

## 2015-10-04 DIAGNOSIS — N2889 Other specified disorders of kidney and ureter: Secondary | ICD-10-CM

## 2015-10-04 DIAGNOSIS — N401 Enlarged prostate with lower urinary tract symptoms: Secondary | ICD-10-CM | POA: Diagnosis not present

## 2015-10-04 DIAGNOSIS — R972 Elevated prostate specific antigen [PSA]: Secondary | ICD-10-CM

## 2015-10-04 DIAGNOSIS — N138 Other obstructive and reflux uropathy: Secondary | ICD-10-CM

## 2015-10-04 NOTE — Patient Instructions (Signed)

## 2015-10-04 NOTE — Progress Notes (Signed)
10/04/2015   Jason Contreras [birth year] [id number]  Referring provider: [name], MD [address] [place], [place] [address]  Chief Complaint  Patient presents with  . Renal Mass    43month    HPI: Jason Contreras is a 75 year old male with a history of elevated PSA, a right renal mass and BPH with LUTS.  He returns to the office today to revisit how to proceed with his renal mass and address elevated PSA now that he has returned from his [place].    Elevated PSA Patient was found to have a PSA of 4.5 ng/mL on 10/19/2013. A 4K score conducted on 12/15/2013 noted a 22% probability of having a Gleason's 7 or higher prostate cancer. PSA was 3.9 ng/mL on 12/29/2014, 5.8 on 06/26/15 and 6.0 on 06/30/15.  Repeat PSA on 09/29/15 5.6. He is found to have an enlarged prostate on DRE, but no nodules have been palpated.    He has a family history of PCa, with father having been diagnosed with PCa with metastatic diease.    Right renal mass Patient has a slow growing right anterior interpolar lesion that most likely is representing an indolent growth of renal cell carcinoma. It has now increased from 1.6 cm x 1.6 cm to 2.2 cm x 2.1 cm in size on the anterior right midpole, enhancing on CT scan.    He has not had any flank pain or gross hematuria. He has not had any weight loss or appetite loss.  Previous abdominal incision include open appendectomy.  He is now ready to have robotic partial nephrectomy.    BPH WITH LUTS Most recent IPSS score 7, which is mild lower urinary tract symptomatology. He is mostly satisfied with his quality life due to his urinary symptoms.  His major complaint today frequent urination and nocturia 1.  He has had these symptoms for many years.  He denies any dysuria, hematuria or suprapubic pain.  He also denies any recent fevers, chills, nausea or vomiting.  No change today.     PMH: Past Medical History  Diagnosis Date  . Hypertension   . Hyperlipidemia   .  Arthritis   . Obesity   . Elevated PSA   . Hematuria   . Adenocarcinoma, renal cell [facility])     Surgical History: Past Surgical History  Procedure Laterality Date  . Appendectomy      Home Medications:    Medication List       This list is accurate as of: 10/04/15  4:26 PM.  Always use your most recent med list.               amLODipine 5 MG tablet  Commonly known as:  NORVASC  Take 1 tablet (5 mg total) by mouth daily.     atorvastatin 20 MG tablet  Commonly known as:  LIPITOR  Take 1 tablet (20 mg total) by mouth daily at 6 PM.     carvedilol 12.5 MG tablet  Commonly known as:  COREG  Take 1 tablet (12.5 mg total) by mouth 2 (two) times daily.     gabapentin 300 MG capsule  Commonly known as:  NEURONTIN  Take 1 capsule (300 mg total) by mouth 4 (four) times daily.     glimepiride 4 MG tablet  Commonly known as:  AMARYL  Take 0.5 tablets (2 mg total) by mouth daily.     losartan 100 MG tablet  Commonly known as:  COZAAR  Take 1 tablet (100 mg total) by mouth daily.     metFORMIN 500 MG tablet  Commonly known as:  GLUCOPHAGE  Take 2 tablets (1,000 mg total) by mouth 2 (two) times daily.     mometasone 0.1 % cream  Commonly known as:  ELOCON  Apply 1 application topically daily.     pioglitazone 45 MG tablet  Commonly known as:  ACTOS  Take 1 tablet (45 mg total) by mouth daily.     vitamin B-12 100 MCG tablet  Commonly known as:  CYANOCOBALAMIN  Take 100 mcg by mouth daily.        Allergies: No Known Allergies  Family History: Family History  Problem Relation Age of Onset  . Cancer Mother     BONE  . Cancer Father     PROSTATE  . Stroke Brother   . Kidney disease Neg Hx     Social History:  reports that he has never smoked. He has never used smokeless tobacco. He reports that he does not drink alcohol or use illicit drugs.   Physical Exam: BP 158/70 mmHg  Pulse 65  Ht 5\' 9"  (1.753 m)  Wt 223 lb (101.152 kg)  BMI 32.92 kg/m2  NAD.   A&O x 3.  Anxious.  Wife present today.  Abd soft, ND, NT.  Obese.     Laboratory Data: Lab Results  Component Value Date   WBC 6.8 11/21/2014   HCT 45.2 11/21/2014   MCV 95 11/21/2014   PLT 247 11/21/2014    Lab Results  Component Value Date   CREATININE 0.91 06/27/2015    PSA history:      4.5 ng/dL on 10/19/2013.      4K score of 22% on 12/15/2013      3.9 ng/mL on 06/28/2014      3.9 ng/mL on 12/29/2014      5.8 ng/Dl on 06/26/15       6.0 ng/dL on 06/30/15       5.6 ng/dL on 09/29/15        Pertinent imaging CLINICAL DATA: Followup right renal mass.  EXAM: CT ABDOMEN AND PELVIS WITHOUT AND WITH CONTRAST  TECHNIQUE: Multidetector CT imaging of the abdomen and pelvis was performed following the standard protocol before and following the bolus administration of intravenous contrast.  CONTRAST: 146mL OMNIPAQUE IOHEXOL 350 MG/ML SOLN  COMPARISON: 12/26/2014 and 06/23/2014  FINDINGS: Lower chest: No acute findings.  Hepatobiliary: Mild diffuse hepatic steatosis noted. No liver masses are identified. Gallbladder is unremarkable.  Pancreas: No mass, inflammatory changes, or other significant abnormality.  Spleen: Within normal limits in size and appearance.  Adrenals/Urinary Tract: No adrenal masses identified. Small mass in the anterior midpole the right kidney which shows evidence of contrast enhancement is seen on image 71/series 5. This shows mild increase in size, currently measuring 2.2 x 2.1 cm compared to 1.6 x 1.6 cm previously. This is suspicious for a small renal cell carcinoma. No other renal masses identified. No evidence hydronephrosis. Lower urinary tract including urinary bladder are unremarkable in appearance.  Stomach/Bowel: No evidence of obstruction, inflammatory process, or abnormal fluid collections. Colonic diverticulosis is noted, however there is no evidence of diverticulitis.  Vascular/Lymphatic: No pathologically  enlarged lymph nodes. No evidence of abdominal aortic aneurysm.  Reproductive: Mildly enlarged prostate remains stable.  Other: None.  Musculoskeletal: No suspicious bone lesions identified.  IMPRESSION: Mild enlargement of 2.2 cm enhancing mass in anterior midpole of right kidney, highly suspicious for  renal cell carcinoma. Consider abdomen MRI without and with contrast for further characterization.  No evidence of abdominal or pelvic metastatic disease.  Incidental findings including mildly enlarged prostate, colonic diverticulosis, and mild hepatic steatosis.   Electronically Signed  By: Earle Gell M.D.  On: 06/22/2015 16:13    Assessment & Plan:    1. Right renal mass:    Patient's renal mass has increased from 1.61.6 cm to 2.22.1 cm, enhancing right anterior midpole mass concerning for renal cell carcinoma. we revisit his options today including continued surveillance, ablative therapy, and robotic partial nephrectomy. We again reviewed the risks and benefits of each modality. He has decided to proceed with robotic partial nephrectomy.  We again today reviewed the preop, intraop, and post op course.  All risks reviewed including risk of bleeding, urine leak, infection damage to surrounding structure, renal dysfunction, need for conversion to radical vs. Open nephrectomy, pain, and other known surgical complications for major surgical procedures.    2. Elevated PSA:     Most recent PSA 5.6. Overall, over the past few years since been steadily rising. I do recommend proceeding with repeat prostate biopsy, especially given his family history.  We discussed prostate biopsy in detail including the procedure itself, the risks of blood in the urine, stool, and ejaculate, serious infection, and discomfort. He is willing to proceed with this as discussed.  Will schedule for next week  3. BPH (benign prostatic hyperplasia) with LUTS:   IPSS 7/2. Stable.    Return in  about 2 weeks (around 10/18/2015) for prostate biopsy.   [name], MD  [facility] [address], [place], [place] [address] [phone]  I spent 25 min with this patient of which greater than 50% was spent in counseling and coordination of care with the patient.

## 2015-10-09 ENCOUNTER — Ambulatory Visit (INDEPENDENT_AMBULATORY_CARE_PROVIDER_SITE_OTHER): Payer: Medicare Other | Admitting: Urology

## 2015-10-09 ENCOUNTER — Other Ambulatory Visit: Payer: Self-pay | Admitting: Urology

## 2015-10-09 ENCOUNTER — Encounter: Payer: Self-pay | Admitting: Urology

## 2015-10-09 VITALS — BP 146/80 | HR 63 | Ht 70.0 in | Wt 222.0 lb

## 2015-10-09 DIAGNOSIS — N4231 Prostatic intraepithelial neoplasia: Secondary | ICD-10-CM | POA: Diagnosis not present

## 2015-10-09 DIAGNOSIS — R972 Elevated prostate specific antigen [PSA]: Secondary | ICD-10-CM | POA: Diagnosis not present

## 2015-10-09 MED ORDER — LEVOFLOXACIN 500 MG PO TABS
500.0000 mg | ORAL_TABLET | Freq: Once | ORAL | Status: AC
  Administered 2015-10-09: 500 mg via ORAL

## 2015-10-09 MED ORDER — GENTAMICIN SULFATE 40 MG/ML IJ SOLN
80.0000 mg | Freq: Once | INTRAMUSCULAR | Status: AC
  Administered 2015-10-09: 80 mg via INTRAMUSCULAR

## 2015-10-09 NOTE — Progress Notes (Signed)
10/09/2015 2:03 PM   Jason Contreras 28-Oct-1940 [id number]  Referring provider: [name], MD [address] [place],  [address]  Chief Complaint  Patient presents with  . Prostate Biopsy    HPI: Jason Contreras is a 75 year old male with a history of elevated PSA, a right renal mass and BPH with LUTS. He returns to the office today for prostate biopsy  Elevated PSA Patient was found to have a PSA of 4.5 ng/mL on 10/19/2013. A 4K score conducted on 12/15/2013 noted a 22% probability of having a Gleason's 7 or higher prostate cancer. PSA was 3.9 ng/mL on 12/29/2014, 5.8 on 06/26/15 and 6.0 on 06/30/15. Repeat PSA on 09/29/15 5.6. He is found to have an enlarged prostate on DRE, but no nodules have been palpated. He has a family history of PCa, with father having been diagnosed with PCa with metastatic diease.    PMH: Past Medical History  Diagnosis Date  . Hypertension   . Hyperlipidemia   . Arthritis   . Obesity   . Elevated PSA   . Hematuria   . Adenocarcinoma, renal cell [facility])     Surgical History: Past Surgical History  Procedure Laterality Date  . Appendectomy      Home Medications:    Medication List       This list is accurate as of: 10/09/15  2:03 PM.  Always use your most recent med list.               amLODipine 5 MG tablet  Commonly known as:  NORVASC  Take 1 tablet (5 mg total) by mouth daily.     atorvastatin 20 MG tablet  Commonly known as:  LIPITOR  Take 1 tablet (20 mg total) by mouth daily at 6 PM.     carvedilol 12.5 MG tablet  Commonly known as:  COREG  Take 1 tablet (12.5 mg total) by mouth 2 (two) times daily.     gabapentin 300 MG capsule  Commonly known as:  NEURONTIN  Take 1 capsule (300 mg total) by mouth 4 (four) times daily.     glimepiride 4 MG tablet  Commonly known as:  AMARYL  Take 0.5 tablets (2 mg total) by mouth daily.     losartan 100 MG tablet  Commonly known as:  COZAAR  Take 1 tablet (100 mg total) by  mouth daily.     metFORMIN 500 MG tablet  Commonly known as:  GLUCOPHAGE  Take 2 tablets (1,000 mg total) by mouth 2 (two) times daily.     mometasone 0.1 % cream  Commonly known as:  ELOCON  Apply 1 application topically daily.     pioglitazone 45 MG tablet  Commonly known as:  ACTOS  Take 1 tablet (45 mg total) by mouth daily.     vitamin B-12 100 MCG tablet  Commonly known as:  CYANOCOBALAMIN  Take 100 mcg by mouth daily.        Allergies: No Known Allergies  Family History: Family History  Problem Relation Age of Onset  . Cancer Mother     BONE  . Cancer Father     PROSTATE  . Stroke Brother   . Kidney disease Neg Hx     Social History:  reports that he has never smoked. He has never used smokeless tobacco. He reports that he does not drink alcohol or use illicit drugs.  Physical Exam: BP 146/80 mmHg  Pulse 63  Ht 5'  10" (1.778 m)  Wt 222 lb (100.699 kg)  BMI 31.85 kg/m2  Constitutional:  Alert and oriented, No acute distress. HEENT: [place] AT, moist mucus membranes.  Trachea midline, no masses. Cardiovascular: No clubbing, cyanosis, or edema. Respiratory: Normal respiratory effort, no increased work of breathing. Neurologic: Grossly intact, no focal deficits, moving all 4 extremities. Psychiatric: Normal mood and affect.  Laboratory Data: Lab Results  Component Value Date   WBC 6.8 11/21/2014   HCT 45.2 11/21/2014   MCV 95 11/21/2014   PLT 247 11/21/2014    Lab Results  Component Value Date   CREATININE 0.91 06/27/2015    Prostate Biopsy Procedure   Informed consent was obtained after discussing risks/benefits of the procedure.  A time out was performed to ensure correct patient identity.  Pre-Procedure: - Last PSA Level: 6.0 - Gentamicin given prophylactically - Levaquin 500 mg administered PO -Transrectal Ultrasound performed revealing a 41 gm prostate -No significant hypoechoic.   -Very small intravesical median lobe present -Small  well-circumscribed nodule at the right mid gland noted  Procedure: - Prostate block performed using 10 cc 1% lidocaine and biopsies taken from sextant areas, a total of 12 under ultrasound guidance.  Post-Procedure: - Patient tolerated the procedure well - He was counseled to seek immediate medical attention if experiences any severe pain, significant bleeding, or fevers - Will call with biopsy results, arrange appt if positive  Assessment & Plan:    1. Elevated PSA S/p uncomplicated biopsy today Will call with results (he will be out of town until June 13th) Warning symptoms reviewed - levofloxacin (LEVAQUIN) tablet 500 mg; Take 1 tablet (500 mg total) by mouth once. - gentamicin (GARAMYCIN) injection 80 mg; Inject 2 mLs (80 mg total) into the muscle once.  [name], MD  [facility] [address], [place], [place] [address] [phone]

## 2015-10-10 ENCOUNTER — Telehealth: Payer: Self-pay | Admitting: Radiology

## 2015-10-10 NOTE — Telephone Encounter (Signed)
LMOM. Need to notify pt of surgery information. 

## 2015-10-10 NOTE — Telephone Encounter (Signed)
Notified pt of surgery scheduled 11/21/15, pre-admit appt on 11/06/15 @8 :00 and to call day prior to surgery for arrival time to [facility]. Pt voices understanding.

## 2015-10-13 LAB — PATHOLOGY REPORT

## 2015-10-16 ENCOUNTER — Telehealth: Payer: Self-pay | Admitting: Urology

## 2015-10-16 ENCOUNTER — Other Ambulatory Visit: Payer: Self-pay | Admitting: Urology

## 2015-10-16 NOTE — Telephone Encounter (Signed)
Yes you can.  I called him on Friday but they did not answer.  He is scheduled for renal surgery so no need for office visit prior to surgery.    [name], MD

## 2015-10-16 NOTE — Telephone Encounter (Signed)
Patient notified per Dr. Erlene Quan that prostate biopsy results were negative.

## 2015-10-16 NOTE — Telephone Encounter (Signed)
Patient and daughter are calling to see if prostate biopsy results are back.  I sent them to your inbox to review.   Can we give him the results?  Do you want him to follow up?

## 2015-10-31 ENCOUNTER — Other Ambulatory Visit: Payer: Self-pay

## 2015-11-06 ENCOUNTER — Encounter
Admission: RE | Admit: 2015-11-06 | Discharge: 2015-11-06 | Disposition: A | Discharge status: Home / Self Care | Payer: Medicare Other | Source: Ambulatory Visit | Attending: Urology | Admitting: Urology

## 2015-11-06 DIAGNOSIS — Z01810 Encounter for preprocedural cardiovascular examination: Secondary | ICD-10-CM | POA: Diagnosis not present

## 2015-11-06 DIAGNOSIS — I10 Essential (primary) hypertension: Secondary | ICD-10-CM | POA: Diagnosis not present

## 2015-11-06 DIAGNOSIS — Z01812 Encounter for preprocedural laboratory examination: Secondary | ICD-10-CM | POA: Insufficient documentation

## 2015-11-06 HISTORY — DX: Headache: R51

## 2015-11-06 HISTORY — DX: Headache, unspecified: R51.9

## 2015-11-06 HISTORY — DX: Polyneuropathy, unspecified: G62.9

## 2015-11-06 LAB — TYPE AND SCREEN
ABO/RH(D): A POS
ANTIBODY SCREEN: NEGATIVE

## 2015-11-06 LAB — URINALYSIS COMPLETE WITH MICROSCOPIC (ARMC ONLY)
BILIRUBIN URINE: NEGATIVE
GLUCOSE, UA: NEGATIVE mg/dL
Hgb urine dipstick: NEGATIVE
KETONES UR: NEGATIVE mg/dL
Leukocytes, UA: NEGATIVE
NITRITE: NEGATIVE
Protein, ur: NEGATIVE mg/dL
Specific Gravity, Urine: 1.016 (ref 1.005–1.030)
Squamous Epithelial / LPF: NONE SEEN
pH: 6 (ref 5.0–8.0)

## 2015-11-06 LAB — BASIC METABOLIC PANEL
ANION GAP: 6 (ref 5–15)
BUN: 14 mg/dL (ref 6–20)
CHLORIDE: 105 mmol/L (ref 101–111)
CO2: 29 mmol/L (ref 22–32)
Calcium: 8.5 mg/dL — ABNORMAL LOW (ref 8.9–10.3)
Creatinine, Ser: 0.83 mg/dL (ref 0.61–1.24)
GFR calc Af Amer: >60 mL/min (ref >60)
Glucose, Bld: 107 mg/dL — ABNORMAL HIGH (ref 65–99)
POTASSIUM: 4 mmol/L (ref 3.5–5.1)
SODIUM: 140 mmol/L (ref 135–145)

## 2015-11-06 LAB — CBC
HCT: 39.2 % — ABNORMAL LOW (ref 40.0–52.0)
HEMOGLOBIN: 13.7 g/dL (ref 13.0–18.0)
MCH: 33 pg (ref 26.0–34.0)
MCHC: 35.1 g/dL (ref 32.0–36.0)
MCV: 93.9 fL (ref 80.0–100.0)
PLATELETS: 200 10*3/uL (ref 150–440)
RBC: 4.17 MIL/uL — AB (ref 4.40–5.90)
RDW: 13 % (ref 11.5–14.5)
WBC: 6 10*3/uL (ref 3.8–10.6)

## 2015-11-06 LAB — PROTIME-INR
INR: 1.1
PROTHROMBIN TIME: 14.4 s (ref 11.4–15.0)

## 2015-11-06 LAB — APTT: APTT: 33 s (ref 24–36)

## 2015-11-06 NOTE — Patient Instructions (Signed)
  Your procedure is scheduled [id number] 18, 2017 (Tuesday) Report [facility]nd [facility] To find out your arrival time please call [phone] between 1PM - 3PM on November 20, 2015 (Monday)  Remember: Instructions that are not followed completely may result in serious medical risk, up to and including death, or upon the discretion of your surgeon and anesthesiologist your surgery may need to be rescheduled.    _x___ 1. Do not eat food or drink liquids after midnight. No gum chewing or hard candies.     __x__ 2. No Alcohol for 24 hours before or after surgery.   __x__3. No Smoking for 24 prior to surgery.   ____  4. Bring all medications with you on the day of surgery if instructed.    __x__ 5. Notify your doctor if there is any change in your medical condition     (cold, fever, infections).     Do not wear jewelry, make-up, hairpins, clips or nail polish.  Do not wear lotions, powders, or perfumes. You may wear deodorant.  Do not shave 48 hours prior to surgery. Men may shave face and neck.  Do not bring valuables to the hospital.    [facility] is not responsible for any belongings or valuables.               Contacts, dentures or bridgework may not be worn into surgery.  Leave your suitcase in the car. After surgery it may be brought to your room.  For patients admitted to the hospital, discharge time is determined by your treatment team.   Patients discharged the day of surgery will not be allowed to drive home.    Please read over the following fact sheets that you were given:   [facility] Preparing for Surgery and or MRSA Information   _x___ Take these medicines the morning of surgery with A SIP OF WATER:    1. Carvedilol  2. Gabapentin  3. Losartan  4.  5.  6.  ____ Fleet Enema (as directed)   ___ Use CHG Soap or sage wipes as directed on instruction sheet   ____ Use inhalers on the day of surgery and bring to hospital day of surgery  _x___  Stop metformin 2 days prior to surgery (STOP METFORMIN ON JULY 16)  ____ Take 1/2 of usual insulin dose the night before surgery and none on the morning of surgery           _x___ Stop aspirin or coumadin, or plavix (NO ASPIRIN)  _x__ Stop Anti-inflammatories such as Advil, Aleve, Ibuprofen, Motrin, Naproxen,          Naprosyn, Goodies powders or aspirin products. Ok to take Tylenol.   __x __ Stop supplements until after surgery. (STOP VITAMIN B-12 NOW)  ____ Bring C-Pap to the hospital.

## 2015-11-07 LAB — URINE CULTURE: CULTURE: NO GROWTH

## 2015-11-27 NOTE — Telephone Encounter (Signed)
Notified pt of surgery r/s to 12/12/15, to go to pre-admit testing office to repeat labs any day week of 12/04/15 & to call day prior to surgery for arrival time to [facility]. Pt voices understanding.

## 2015-11-29 ENCOUNTER — Encounter (INDEPENDENT_AMBULATORY_CARE_PROVIDER_SITE_OTHER): Payer: Self-pay

## 2015-12-06 ENCOUNTER — Ambulatory Visit
Admission: RE | Admit: 2015-12-06 | Discharge: 2015-12-06 | Disposition: A | Discharge status: Home / Self Care | Payer: Medicare Other | Source: Ambulatory Visit | Attending: Urology | Admitting: Urology

## 2015-12-06 DIAGNOSIS — Z01812 Encounter for preprocedural laboratory examination: Secondary | ICD-10-CM | POA: Insufficient documentation

## 2015-12-06 DIAGNOSIS — Z8042 Family history of malignant neoplasm of prostate: Secondary | ICD-10-CM | POA: Diagnosis not present

## 2015-12-06 DIAGNOSIS — Z823 Family history of stroke: Secondary | ICD-10-CM | POA: Diagnosis not present

## 2015-12-06 DIAGNOSIS — E119 Type 2 diabetes mellitus without complications: Secondary | ICD-10-CM | POA: Diagnosis present

## 2015-12-06 DIAGNOSIS — N289 Disorder of kidney and ureter, unspecified: Secondary | ICD-10-CM | POA: Diagnosis not present

## 2015-12-06 DIAGNOSIS — Z7984 Long term (current) use of oral hypoglycemic drugs: Secondary | ICD-10-CM | POA: Diagnosis not present

## 2015-12-06 DIAGNOSIS — Z79899 Other long term (current) drug therapy: Secondary | ICD-10-CM | POA: Diagnosis not present

## 2015-12-06 DIAGNOSIS — E785 Hyperlipidemia, unspecified: Secondary | ICD-10-CM | POA: Diagnosis not present

## 2015-12-06 DIAGNOSIS — N2889 Other specified disorders of kidney and ureter: Secondary | ICD-10-CM | POA: Diagnosis not present

## 2015-12-06 DIAGNOSIS — N401 Enlarged prostate with lower urinary tract symptoms: Secondary | ICD-10-CM | POA: Diagnosis not present

## 2015-12-06 DIAGNOSIS — Z6832 Body mass index (BMI) 32.0-32.9, adult: Secondary | ICD-10-CM | POA: Diagnosis not present

## 2015-12-06 DIAGNOSIS — I10 Essential (primary) hypertension: Secondary | ICD-10-CM | POA: Diagnosis present

## 2015-12-06 DIAGNOSIS — C641 Malignant neoplasm of right kidney, except renal pelvis: Secondary | ICD-10-CM | POA: Diagnosis not present

## 2015-12-06 DIAGNOSIS — E669 Obesity, unspecified: Secondary | ICD-10-CM | POA: Diagnosis present

## 2015-12-07 LAB — URINE CULTURE
CULTURE: NO GROWTH
Special Requests: NORMAL

## 2015-12-12 ENCOUNTER — Inpatient Hospital Stay: Payer: Medicare Other | Admitting: Anesthesiology

## 2015-12-12 ENCOUNTER — Inpatient Hospital Stay
Admission: RE | Admit: 2015-12-12 | Discharge: 2015-12-14 | DRG: 658 | Disposition: A | Discharge status: Home / Self Care | Payer: Medicare Other | Source: Ambulatory Visit | Attending: Urology | Admitting: Urology

## 2015-12-12 ENCOUNTER — Encounter: Payer: Self-pay | Admitting: *Deleted

## 2015-12-12 ENCOUNTER — Encounter
Admission: RE | Disposition: A | Discharge status: Home / Self Care | Payer: Self-pay | Source: Ambulatory Visit | Attending: Urology

## 2015-12-12 DIAGNOSIS — I10 Essential (primary) hypertension: Secondary | ICD-10-CM | POA: Diagnosis present

## 2015-12-12 DIAGNOSIS — Z79899 Other long term (current) drug therapy: Secondary | ICD-10-CM

## 2015-12-12 DIAGNOSIS — E785 Hyperlipidemia, unspecified: Secondary | ICD-10-CM | POA: Diagnosis present

## 2015-12-12 DIAGNOSIS — Z8042 Family history of malignant neoplasm of prostate: Secondary | ICD-10-CM | POA: Diagnosis not present

## 2015-12-12 DIAGNOSIS — E119 Type 2 diabetes mellitus without complications: Secondary | ICD-10-CM | POA: Diagnosis present

## 2015-12-12 DIAGNOSIS — Z823 Family history of stroke: Secondary | ICD-10-CM | POA: Diagnosis not present

## 2015-12-12 DIAGNOSIS — C641 Malignant neoplasm of right kidney, except renal pelvis: Principal | ICD-10-CM | POA: Diagnosis present

## 2015-12-12 DIAGNOSIS — Z7984 Long term (current) use of oral hypoglycemic drugs: Secondary | ICD-10-CM

## 2015-12-12 DIAGNOSIS — E669 Obesity, unspecified: Secondary | ICD-10-CM | POA: Diagnosis present

## 2015-12-12 DIAGNOSIS — N401 Enlarged prostate with lower urinary tract symptoms: Secondary | ICD-10-CM | POA: Diagnosis present

## 2015-12-12 DIAGNOSIS — N2889 Other specified disorders of kidney and ureter: Secondary | ICD-10-CM | POA: Diagnosis present

## 2015-12-12 DIAGNOSIS — Z6832 Body mass index (BMI) 32.0-32.9, adult: Secondary | ICD-10-CM

## 2015-12-12 HISTORY — PX: ROBOTIC ASSITED PARTIAL NEPHRECTOMY: SHX6087

## 2015-12-12 LAB — CBC
HCT: 41.3 % (ref 40.0–52.0)
HEMOGLOBIN: 14.5 g/dL (ref 13.0–18.0)
MCH: 33 pg (ref 26.0–34.0)
MCHC: 35 g/dL (ref 32.0–36.0)
MCV: 94.3 fL (ref 80.0–100.0)
Platelets: 174 10*3/uL (ref 150–440)
RBC: 4.38 MIL/uL — ABNORMAL LOW (ref 4.40–5.90)
RDW: 13.2 % (ref 11.5–14.5)
WBC: 9.4 10*3/uL (ref 3.8–10.6)

## 2015-12-12 LAB — BASIC METABOLIC PANEL
Anion gap: 6 (ref 5–15)
BUN: 13 mg/dL (ref 6–20)
CHLORIDE: 102 mmol/L (ref 101–111)
CO2: 28 mmol/L (ref 22–32)
CREATININE: 0.88 mg/dL (ref 0.61–1.24)
Calcium: 8.4 mg/dL — ABNORMAL LOW (ref 8.9–10.3)
GFR calc Af Amer: >60 mL/min (ref >60)
GFR calc non Af Amer: >60 mL/min (ref >60)
Glucose, Bld: 250 mg/dL — ABNORMAL HIGH (ref 65–99)
Potassium: 4 mmol/L (ref 3.5–5.1)
SODIUM: 136 mmol/L (ref 135–145)

## 2015-12-12 LAB — GLUCOSE, CAPILLARY
GLUCOSE-CAPILLARY: 198 mg/dL — AB (ref 65–99)
GLUCOSE-CAPILLARY: 272 mg/dL — AB (ref 65–99)
GLUCOSE-CAPILLARY: 290 mg/dL — AB (ref 65–99)
GLUCOSE-CAPILLARY: 217 mg/dL — AB (ref 65–99)
Glucose-Capillary: 266 mg/dL — ABNORMAL HIGH (ref 65–99)
Glucose-Capillary: 154 mg/dL — ABNORMAL HIGH (ref 65–99)

## 2015-12-12 SURGERY — ROBOTIC ASSITED PARTIAL NEPHRECTOMY
Anesthesia: General | Laterality: Right | Wound class: Clean Contaminated

## 2015-12-12 MED ORDER — CEFAZOLIN IN D5W 1 GM/50ML IV SOLN
1.0000 g | Freq: Three times a day (TID) | INTRAVENOUS | Status: AC
  Administered 2015-12-12 – 2015-12-13 (×2): 1 g via INTRAVENOUS
  Filled 2015-12-12 (×2): qty 50

## 2015-12-12 MED ORDER — INSULIN ASPART 100 UNIT/ML ~~LOC~~ SOLN
SUBCUTANEOUS | Status: AC
  Filled 2015-12-12: qty 6

## 2015-12-12 MED ORDER — INSULIN ASPART 100 UNIT/ML ~~LOC~~ SOLN: 0.0000 [IU] | Freq: Every day | SUBCUTANEOUS | Status: DC

## 2015-12-12 MED ORDER — MORPHINE SULFATE (PF) 2 MG/ML IV SOLN
2.0000 mg | INTRAVENOUS | Status: DC | PRN
  Administered 2015-12-12 – 2015-12-13 (×2): 2 mg via INTRAVENOUS
  Filled 2015-12-12 (×4): qty 1

## 2015-12-12 MED ORDER — ONDANSETRON HCL 4 MG/2ML IJ SOLN
4.0000 mg | Freq: Once | INTRAMUSCULAR | Status: AC | PRN
  Administered 2015-12-12: 4 mg via INTRAVENOUS

## 2015-12-12 MED ORDER — PROMETHAZINE HCL 25 MG/ML IJ SOLN
INTRAMUSCULAR | Status: AC
  Administered 2015-12-12: 6.25 mg via INTRAVENOUS
  Filled 2015-12-12: qty 1

## 2015-12-12 MED ORDER — DOCUSATE SODIUM 100 MG PO CAPS
100.0000 mg | ORAL_CAPSULE | Freq: Two times a day (BID) | ORAL | Status: DC
  Administered 2015-12-12 – 2015-12-14 (×3): 100 mg via ORAL
  Filled 2015-12-12 (×4): qty 1

## 2015-12-12 MED ORDER — BELLADONNA ALKALOIDS-OPIUM 16.2-60 MG RE SUPP: 1.0000 | Freq: Four times a day (QID) | RECTAL | Status: DC | PRN

## 2015-12-12 MED ORDER — OXYBUTYNIN CHLORIDE 5 MG PO TABS: 5.0000 mg | ORAL_TABLET | Freq: Three times a day (TID) | ORAL | Status: DC | PRN

## 2015-12-12 MED ORDER — FENTANYL CITRATE (PF) 100 MCG/2ML IJ SOLN
INTRAMUSCULAR | Status: DC | PRN
  Administered 2015-12-12: 75 ug via INTRAVENOUS
  Administered 2015-12-12: 250 ug via INTRAVENOUS
  Administered 2015-12-12: 75 ug via INTRAVENOUS
  Administered 2015-12-12: 100 ug via INTRAVENOUS
  Administered 2015-12-12: 250 ug via INTRAVENOUS

## 2015-12-12 MED ORDER — INSULIN ASPART 100 UNIT/ML ~~LOC~~ SOLN
5.0000 [IU] | Freq: Once | SUBCUTANEOUS | Status: AC
  Administered 2015-12-12: 5 [IU] via SUBCUTANEOUS

## 2015-12-12 MED ORDER — ONDANSETRON HCL 4 MG/2ML IJ SOLN
INTRAMUSCULAR | Status: DC | PRN
Start: 2015-12-12 — End: 2015-12-12
  Administered 2015-12-12: 4 mg via INTRAVENOUS

## 2015-12-12 MED ORDER — ATORVASTATIN CALCIUM 20 MG PO TABS
20.0000 mg | ORAL_TABLET | Freq: Every day | ORAL | Status: DC
  Administered 2015-12-12: 20 mg via ORAL
  Filled 2015-12-12: qty 1

## 2015-12-12 MED ORDER — SODIUM CHLORIDE 0.9 % IV SOLN
INTRAVENOUS | Status: DC
  Administered 2015-12-12 – 2015-12-14 (×5): via INTRAVENOUS

## 2015-12-12 MED ORDER — CARVEDILOL 12.5 MG PO TABS
12.5000 mg | ORAL_TABLET | Freq: Two times a day (BID) | ORAL | Status: DC
  Administered 2015-12-12 – 2015-12-14 (×3): 12.5 mg via ORAL
  Filled 2015-12-12 (×4): qty 1

## 2015-12-12 MED ORDER — AMLODIPINE BESYLATE 5 MG PO TABS
5.0000 mg | ORAL_TABLET | Freq: Every day | ORAL | Status: DC
  Administered 2015-12-14: 5 mg via ORAL
  Filled 2015-12-12 (×2): qty 1

## 2015-12-12 MED ORDER — LACTATED RINGERS IV SOLN
INTRAVENOUS | Status: DC | PRN
  Administered 2015-12-12 (×2): via INTRAVENOUS

## 2015-12-12 MED ORDER — CEFAZOLIN SODIUM-DEXTROSE 2-4 GM/100ML-% IV SOLN
INTRAVENOUS | Status: AC
  Filled 2015-12-12: qty 100

## 2015-12-12 MED ORDER — OXYCODONE-ACETAMINOPHEN 5-325 MG PO TABS
1.0000 | ORAL_TABLET | ORAL | Status: DC | PRN
  Administered 2015-12-13 – 2015-12-14 (×4): 2 via ORAL
  Filled 2015-12-12 (×4): qty 2

## 2015-12-12 MED ORDER — ACETAMINOPHEN 325 MG PO TABS
650.0000 mg | ORAL_TABLET | ORAL | Status: DC | PRN
Start: 2015-12-12 — End: 2015-12-14

## 2015-12-12 MED ORDER — FENTANYL CITRATE (PF) 100 MCG/2ML IJ SOLN
25.0000 ug | INTRAMUSCULAR | Status: DC | PRN
  Administered 2015-12-12 (×5): 25 ug via INTRAVENOUS

## 2015-12-12 MED ORDER — FENTANYL CITRATE (PF) 100 MCG/2ML IJ SOLN
INTRAMUSCULAR | Status: AC
  Filled 2015-12-12: qty 2

## 2015-12-12 MED ORDER — DEXMEDETOMIDINE HCL IN NACL 200 MCG/50ML IV SOLN
INTRAVENOUS | Status: DC | PRN
  Administered 2015-12-12: .45 ug/kg/h via INTRAVENOUS

## 2015-12-12 MED ORDER — HYDROMORPHONE HCL 1 MG/ML IJ SOLN
INTRAMUSCULAR | Status: DC | PRN
  Administered 2015-12-12: 2 mg via INTRAVENOUS

## 2015-12-12 MED ORDER — GABAPENTIN 300 MG PO CAPS
300.0000 mg | ORAL_CAPSULE | Freq: Three times a day (TID) | ORAL | Status: DC
  Administered 2015-12-12 – 2015-12-14 (×3): 300 mg via ORAL
  Filled 2015-12-12 (×4): qty 1

## 2015-12-12 MED ORDER — CEFAZOLIN SODIUM-DEXTROSE 2-4 GM/100ML-% IV SOLN
2.0000 g | Freq: Once | INTRAVENOUS | Status: AC
  Administered 2015-12-12: 2 g via INTRAVENOUS

## 2015-12-12 MED ORDER — BUPIVACAINE HCL (PF) 0.5 % IJ SOLN
INTRAMUSCULAR | Status: AC
  Filled 2015-12-12: qty 30

## 2015-12-12 MED ORDER — PROPOFOL 10 MG/ML IV BOLUS
INTRAVENOUS | Status: DC | PRN
  Administered 2015-12-12: 50 mg via INTRAVENOUS
  Administered 2015-12-12: 150 mg via INTRAVENOUS

## 2015-12-12 MED ORDER — MANNITOL 25 % IV SOLN
INTRAVENOUS | Status: AC
  Filled 2015-12-12: qty 100

## 2015-12-12 MED ORDER — HYDRALAZINE HCL 20 MG/ML IJ SOLN
INTRAMUSCULAR | Status: DC | PRN
  Administered 2015-12-12: 10 mg via INTRAVENOUS

## 2015-12-12 MED ORDER — SODIUM CHLORIDE 0.9 % IV SOLN
INTRAVENOUS | Status: DC
  Administered 2015-12-12: 08:00:00 via INTRAVENOUS

## 2015-12-12 MED ORDER — LIDOCAINE HCL (CARDIAC) 20 MG/ML IV SOLN
INTRAVENOUS | Status: DC | PRN
  Administered 2015-12-12: 100 mg via INTRAVENOUS

## 2015-12-12 MED ORDER — INSULIN ASPART 100 UNIT/ML ~~LOC~~ SOLN
6.0000 [IU] | Freq: Once | SUBCUTANEOUS | Status: AC
  Administered 2015-12-12: 6 [IU] via SUBCUTANEOUS

## 2015-12-12 MED ORDER — PHENYLEPHRINE HCL 10 MG/ML IJ SOLN
INTRAVENOUS | Status: DC | PRN
  Administered 2015-12-12: 30 ug/min via INTRAVENOUS

## 2015-12-12 MED ORDER — INSULIN ASPART 100 UNIT/ML ~~LOC~~ SOLN
4.0000 [IU] | Freq: Three times a day (TID) | SUBCUTANEOUS | Status: DC
  Administered 2015-12-12 – 2015-12-14 (×6): 4 [IU] via SUBCUTANEOUS
  Filled 2015-12-12 (×6): qty 4

## 2015-12-12 MED ORDER — LOSARTAN POTASSIUM 50 MG PO TABS
100.0000 mg | ORAL_TABLET | Freq: Every day | ORAL | Status: DC
  Administered 2015-12-14: 100 mg via ORAL
  Filled 2015-12-12 (×2): qty 2

## 2015-12-12 MED ORDER — PHENYLEPHRINE HCL 10 MG/ML IJ SOLN
INTRAMUSCULAR | Status: DC | PRN
  Administered 2015-12-12: 100 ug via INTRAVENOUS
  Administered 2015-12-12: 200 ug via INTRAVENOUS

## 2015-12-12 MED ORDER — FAMOTIDINE 20 MG PO TABS
20.0000 mg | ORAL_TABLET | Freq: Once | ORAL | Status: AC
  Administered 2015-12-12: 20 mg via ORAL

## 2015-12-12 MED ORDER — BUPIVACAINE HCL 0.5 % IJ SOLN
INTRAMUSCULAR | Status: DC | PRN
  Administered 2015-12-12: 20 mL

## 2015-12-12 MED ORDER — FENTANYL CITRATE (PF) 100 MCG/2ML IJ SOLN
INTRAMUSCULAR | Status: AC
  Administered 2015-12-12: 25 ug via INTRAVENOUS
  Filled 2015-12-12: qty 2

## 2015-12-12 MED ORDER — THROMBIN 5000 UNITS EX SOLR
CUTANEOUS | Status: AC
  Filled 2015-12-12: qty 5000

## 2015-12-12 MED ORDER — DIPHENHYDRAMINE HCL 12.5 MG/5ML PO ELIX
12.5000 mg | ORAL_SOLUTION | Freq: Four times a day (QID) | ORAL | Status: DC | PRN
  Filled 2015-12-12: qty 5

## 2015-12-12 MED ORDER — DIPHENHYDRAMINE HCL 50 MG/ML IJ SOLN: 12.5000 mg | Freq: Four times a day (QID) | INTRAMUSCULAR | Status: DC | PRN

## 2015-12-12 MED ORDER — SODIUM CHLORIDE 0.9 % IJ SOLN
INTRAMUSCULAR | Status: AC
  Filled 2015-12-12: qty 10

## 2015-12-12 MED ORDER — ACETAMINOPHEN 10 MG/ML IV SOLN
INTRAVENOUS | Status: AC
  Filled 2015-12-12: qty 100

## 2015-12-12 MED ORDER — EVICEL 5 ML EX KIT
PACK | CUTANEOUS | Status: AC
  Filled 2015-12-12: qty 1

## 2015-12-12 MED ORDER — INSULIN ASPART 100 UNIT/ML ~~LOC~~ SOLN
SUBCUTANEOUS | Status: AC
  Administered 2015-12-12: 5 [IU] via SUBCUTANEOUS
  Filled 2015-12-12: qty 5

## 2015-12-12 MED ORDER — SUGAMMADEX SODIUM 200 MG/2ML IV SOLN
INTRAVENOUS | Status: DC | PRN
  Administered 2015-12-12: 200 mg via INTRAVENOUS

## 2015-12-12 MED ORDER — ROCURONIUM BROMIDE 100 MG/10ML IV SOLN
INTRAVENOUS | Status: DC | PRN
  Administered 2015-12-12: 10 mg via INTRAVENOUS
  Administered 2015-12-12 (×2): 50 mg via INTRAVENOUS
  Administered 2015-12-12: 20 mg via INTRAVENOUS

## 2015-12-12 MED ORDER — PROMETHAZINE HCL 25 MG/ML IJ SOLN
6.2500 mg | Freq: Once | INTRAMUSCULAR | Status: AC
  Administered 2015-12-12: 6.25 mg via INTRAVENOUS

## 2015-12-12 MED ORDER — EPHEDRINE SULFATE 50 MG/ML IJ SOLN
INTRAMUSCULAR | Status: DC | PRN
  Administered 2015-12-12: 15 mg via INTRAVENOUS

## 2015-12-12 MED ORDER — MANNITOL 25 % IV SOLN
INTRAVENOUS | Status: DC | PRN
  Administered 2015-12-12 (×2): 12.5 g via INTRAVENOUS

## 2015-12-12 MED ORDER — INSULIN ASPART 100 UNIT/ML ~~LOC~~ SOLN
0.0000 [IU] | Freq: Three times a day (TID) | SUBCUTANEOUS | Status: DC
  Administered 2015-12-12: 5 [IU] via SUBCUTANEOUS
  Administered 2015-12-13: 3 [IU] via SUBCUTANEOUS
  Administered 2015-12-13 (×2): 2 [IU] via SUBCUTANEOUS
  Administered 2015-12-14 (×2): 3 [IU] via SUBCUTANEOUS
  Filled 2015-12-12: qty 3
  Filled 2015-12-12: qty 2
  Filled 2015-12-12: qty 5
  Filled 2015-12-12: qty 2
  Filled 2015-12-12 (×2): qty 3

## 2015-12-12 MED ORDER — ONDANSETRON HCL 4 MG/2ML IJ SOLN
INTRAMUSCULAR | Status: AC
  Administered 2015-12-12: 4 mg via INTRAVENOUS
  Filled 2015-12-12: qty 2

## 2015-12-12 MED ORDER — EVICEL 5 ML EX KIT
PACK | CUTANEOUS | Status: DC | PRN
  Administered 2015-12-12: 5 mL

## 2015-12-12 MED ORDER — ACETAMINOPHEN 10 MG/ML IV SOLN
INTRAVENOUS | Status: DC | PRN
  Administered 2015-12-12: 1000 mg via INTRAVENOUS

## 2015-12-12 MED ORDER — THROMBIN 5000 UNITS EX SOLR
CUTANEOUS | Status: DC | PRN
  Administered 2015-12-12: 5000 [IU] via TOPICAL

## 2015-12-12 MED ORDER — ONDANSETRON HCL 4 MG/2ML IJ SOLN
4.0000 mg | INTRAMUSCULAR | Status: DC | PRN
Start: 2015-12-12 — End: 2015-12-14
  Administered 2015-12-12 – 2015-12-13 (×4): 4 mg via INTRAVENOUS
  Filled 2015-12-12 (×4): qty 2

## 2015-12-12 MED ORDER — MIDAZOLAM HCL 2 MG/2ML IJ SOLN
INTRAMUSCULAR | Status: DC | PRN
  Administered 2015-12-12: 2 mg via INTRAVENOUS

## 2015-12-12 MED ORDER — FUROSEMIDE 10 MG/ML IJ SOLN
INTRAMUSCULAR | Status: DC | PRN
  Administered 2015-12-12: 10 mg via INTRAMUSCULAR

## 2015-12-12 SURGICAL SUPPLY — 92 items
ANCHOR TIS RET SYS 235ML (MISCELLANEOUS) ×3 IMPLANT
APPLICATOR SURGIFLO (MISCELLANEOUS) ×3 IMPLANT
BULB RESERV EVAC DRAIN JP 100C (MISCELLANEOUS) ×3 IMPLANT
CANISTER SUCT 1200ML W/VALVE (MISCELLANEOUS) ×3 IMPLANT
CANNULA SEAL DVNC (CANNULA) ×1 IMPLANT
CANNULA SEALS DA VINCI (CANNULA) ×2
CHLORAPREP W/TINT 26ML (MISCELLANEOUS) ×3 IMPLANT
CLIP LIGATING HEM O LOK PURPLE (MISCELLANEOUS) ×9 IMPLANT
CLIP LIGATING HEMO O LOK GREEN (MISCELLANEOUS) IMPLANT
CLIP SUT LAPRA TY ABSORB (SUTURE) ×12 IMPLANT
CORD BIP STRL DISP 12FT (MISCELLANEOUS) ×3 IMPLANT
COVER TIP SHEARS 8 DVNC (MISCELLANEOUS) ×1 IMPLANT
COVER TIP SHEARS 8MM DA VINCI (MISCELLANEOUS) ×2
DEFOGGER SCOPE WARMER CLEARIFY (MISCELLANEOUS) ×3 IMPLANT
DEVICE PMI PUNCTURE CLOSURE (MISCELLANEOUS) ×3 IMPLANT
DRAIN CHANNEL JP 19F (MISCELLANEOUS) IMPLANT
DRAPE SHEET LG 3/4 BI-LAMINATE (DRAPES) ×3 IMPLANT
DRAPE SURG 17X11 SM STRL (DRAPES) ×12 IMPLANT
DRIVER LRG NEEDLE DA VINCI (INSTRUMENTS) ×2
DRIVER NDLE LRG DVNC (INSTRUMENTS) ×1 IMPLANT
DRSG TEGADERM 4X4.75 (GAUZE/BANDAGES/DRESSINGS) ×3 IMPLANT
ELECT PAD DSPR THERM+ ADLT (MISCELLANEOUS) IMPLANT
ELECT REM PT RETURN 9FT ADLT (ELECTROSURGICAL) ×3
ELECTRODE REM PT RTRN 9FT ADLT (ELECTROSURGICAL) ×1 IMPLANT
FILTER LAP SMOKE EVAC STRL (MISCELLANEOUS) ×3 IMPLANT
GLOVE BIO SURGEON STRL SZ 6.5 (GLOVE) ×6 IMPLANT
GLOVE BIO SURGEONS STRL SZ 6.5 (GLOVE) ×3
GLOVE BIOGEL PI IND STRL 6.5 (GLOVE) ×2 IMPLANT
GLOVE BIOGEL PI INDICATOR 6.5 (GLOVE) ×4
GOWN STRL REUS W/ TWL LRG LVL3 (GOWN DISPOSABLE) ×6 IMPLANT
GOWN STRL REUS W/TWL LRG LVL3 (GOWN DISPOSABLE) ×12
GRASPER DBL FENESTRATED (INSTRUMENTS) ×2
GRASPER DBL FENESTRATED DVNC (INSTRUMENTS) ×1 IMPLANT
HEMOSTAT SURGICEL 2X14 (HEMOSTASIS) ×6 IMPLANT
IRRIGATION STRYKERFLOW (MISCELLANEOUS) ×1 IMPLANT
IRRIGATOR STRYKERFLOW (MISCELLANEOUS) ×3
IV LACTATED RINGERS 1000ML (IV SOLUTION) ×3 IMPLANT
IV NS 1000ML (IV SOLUTION) ×2
IV NS 1000ML BAXH (IV SOLUTION) ×1 IMPLANT
KIT ACCESSORY DA VINCI DISP (KITS) ×2
KIT ACCESSORY DVNC DISP (KITS) ×1 IMPLANT
KIT PINK PAD W/HEAD ARE REST (MISCELLANEOUS) ×6
KIT PINK PAD W/HEAD ARM REST (MISCELLANEOUS) ×2 IMPLANT
KIT RM TURNOVER STRD PROC AR (KITS) ×3 IMPLANT
LABEL OR SOLS (LABEL) ×3 IMPLANT
LIQUID BAND (GAUZE/BANDAGES/DRESSINGS) ×3 IMPLANT
LOOP RED MAXI  1X406MM (MISCELLANEOUS) ×2
LOOP VESSEL MAXI 1X406 RED (MISCELLANEOUS) ×1 IMPLANT
NDL SAFETY 18GX1.5 (NEEDLE) ×3 IMPLANT
NEEDLE HYPO 25X1 1.5 SAFETY (NEEDLE) ×3 IMPLANT
NEEDLE INSUFFLATION 14GA 120MM (NEEDLE) ×3 IMPLANT
NS IRRIG 500ML POUR BTL (IV SOLUTION) ×3 IMPLANT
PACK LAP CHOLECYSTECTOMY (MISCELLANEOUS) ×3 IMPLANT
PENCIL ELECTRO HAND CTR (MISCELLANEOUS) ×3 IMPLANT
PROBE ULTRASOUND PROART (MISCELLANEOUS) ×3 IMPLANT
PROGRASP ENDOWRIST DA VINCI (INSTRUMENTS) ×2
PROGRASP ENDOWRIST DVNC (INSTRUMENTS) ×1 IMPLANT
SCISSORS METZENBAUM CVD 33 (INSTRUMENTS) IMPLANT
SLEEVE ENDOPATH XCEL 5M (ENDOMECHANICALS) ×6 IMPLANT
SOLUTION ELECTROLUBE (MISCELLANEOUS) ×3 IMPLANT
SPOGE SURGIFLO 8M (HEMOSTASIS) ×2
SPONGE EXCIL AMD DRAIN 4X4 6P (MISCELLANEOUS) ×3 IMPLANT
SPONGE SURGIFLO 8M (HEMOSTASIS) ×1 IMPLANT
STAPLE RELOAD 2.5MM WHITE (STAPLE) IMPLANT
STAPLER SKIN PROX 35W (STAPLE) ×3 IMPLANT
STAPLER VASCULAR ECHELON 35 (CUTTER) IMPLANT
STRAP SAFETY BODY (MISCELLANEOUS) ×9 IMPLANT
SUT DVC VLOC 90 3-0 CV23 UNDY (SUTURE) ×6 IMPLANT
SUT ETHILON 3-0 FS-10 30 BLK (SUTURE) ×3
SUT MNCRL AB 4-0 PS2 18 (SUTURE) ×6 IMPLANT
SUT PDS PLUS 0 (SUTURE)
SUT PDS PLUS AB 0 CT-2 (SUTURE) IMPLANT
SUT PROLENE 4 0 RB 1 (SUTURE) ×4
SUT PROLENE 4-0 RB1 .5 CRCL 36 (SUTURE) ×2 IMPLANT
SUT VIC AB 0 CT1 36 (SUTURE) ×6 IMPLANT
SUT VIC AB 0 CT2 27 (SUTURE) ×3 IMPLANT
SUT VIC AB 2-0 SH 27 (SUTURE) ×12
SUT VIC AB 2-0 SH 27XBRD (SUTURE) ×6 IMPLANT
SUT VICRYL 0 AB UR-6 (SUTURE) IMPLANT
SUT VICRYL PLUS ABS 0 54 (SUTURE) IMPLANT
SUTURE EHLN 3-0 FS-10 30 BLK (SUTURE) ×1 IMPLANT
SYR 3ML LL SCALE MARK (SYRINGE) ×3 IMPLANT
TAPE CLOTH 10X20 WHT NS LF (TAPE) ×2 IMPLANT
TAPE CLOTH 2X10 WHT NS LF (TAPE) ×4
TRAY FOLEY W/METER SILVER 16FR (SET/KITS/TRAYS/PACK) ×3 IMPLANT
TROCAR 12M 150ML BLUNT (TROCAR) ×3 IMPLANT
TROCAR DISP BLADELESS 8 DVNC (TROCAR) ×1 IMPLANT
TROCAR DISP BLADELESS 8MM (TROCAR) ×2
TROCAR ENDOPATH XCEL 12X100 BL (ENDOMECHANICALS) ×3 IMPLANT
TROCAR XCEL 12X100 BLDLESS (ENDOMECHANICALS) ×3 IMPLANT
TROCAR XCEL NON-BLD 5MMX100MML (ENDOMECHANICALS) ×3 IMPLANT
TUBING INSUFFLATOR HI FLOW (MISCELLANEOUS) ×3 IMPLANT

## 2015-12-12 NOTE — H&P (Signed)
12/12/15   Jason Contreras 07-Aug-1940 [id number]  Referring provider: [name], MD [address] [place], [place] [address]      Chief Complaint  Patient presents with  . Renal Mass    69month    HPI: Jason Contreras is a 74 year old male with a history of elevated PSA, a right renal mass and BPH with LUTS.    Elevated PSA Patient was found to have a PSA of 4.5 ng/mL on 10/19/2013. A 4K score conducted on 12/15/2013 noted a 22% probability of having a Gleason's 7 or higher prostate cancer. PSA was 3.9 ng/mL on 12/29/2014, 5.8 on 06/26/15 and 6.0 on 06/30/15.  Repeat PSA on 09/29/15 5.6. He is found to have an enlarged prostate on DRE, but no nodules have been palpated. Prostate biopsy negative on 10/09/15.   He has a family history of PCa, with father having been diagnosed with PCa with metastatic diease.    Right renal mass Patient has a slow growing right anterior interpolar lesion that most likely is representing an indolent growth of renal cell carcinoma. It has now increased from 1.6 cm x 1.6 cm to 2.2 cm x 2.1 cm in size on the anterior right midpole, enhancing on CT scan.    He has not had any flank pain or gross hematuria. He has not had any weight loss or appetite loss.  Previous abdominal incision include open appendectomy.  He is now ready to have robotic partial nephrectomy.    BPH WITH LUTS Most recent IPSS score 7, which is mild lower urinary tract symptomatology. He is mostly satisfied with his quality life due to his urinary symptoms.  His major complaint today frequent urination and nocturia 1.  He has had these symptoms for many years.  He denies any dysuria, hematuria or suprapubic pain.  He also denies any recent fevers, chills, nausea or vomiting.  No change today.     PMH:     Past Medical History  Diagnosis Date  . Hypertension   . Hyperlipidemia   . Arthritis   . Obesity   . Elevated PSA   . Hematuria   . Adenocarcinoma, renal cell  [facility])     Surgical History:      Past Surgical History  Procedure Laterality Date  . Appendectomy      Home Medications:        Medication List       This list is accurate as of: 10/04/15  4:26 PM.  Always use your most recent med list.                amLODipine 5 MG tablet  Commonly known as:  NORVASC  Take 1 tablet (5 mg total) by mouth daily.     atorvastatin 20 MG tablet  Commonly known as:  LIPITOR  Take 1 tablet (20 mg total) by mouth daily at 6 PM.     carvedilol 12.5 MG tablet  Commonly known as:  COREG  Take 1 tablet (12.5 mg total) by mouth 2 (two) times daily.     gabapentin 300 MG capsule  Commonly known as:  NEURONTIN  Take 1 capsule (300 mg total) by mouth 4 (four) times daily.     glimepiride 4 MG tablet  Commonly known as:  AMARYL  Take 0.5 tablets (2 mg total) by mouth daily.     losartan 100 MG tablet  Commonly known as:  COZAAR  Take 1 tablet (100  mg total) by mouth daily.     metFORMIN 500 MG tablet  Commonly known as:  GLUCOPHAGE  Take 2 tablets (1,000 mg total) by mouth 2 (two) times daily.     mometasone 0.1 % cream  Commonly known as:  ELOCON  Apply 1 application topically daily.     pioglitazone 45 MG tablet  Commonly known as:  ACTOS  Take 1 tablet (45 mg total) by mouth daily.     vitamin B-12 100 MCG tablet  Commonly known as:  CYANOCOBALAMIN  Take 100 mcg by mouth daily.        Allergies: No Known Allergies  Family History:       Family History  Problem Relation Age of Onset  . Cancer Mother     BONE  . Cancer Father     PROSTATE  . Stroke Brother   . Kidney disease Neg Hx     Social History:  reports that he has never smoked. He has never used smokeless tobacco. He reports that he does not drink alcohol or use illicit drugs.   Physical Exam: BP 158/70 mmHg  Pulse 65  Ht 5\' 9"  (1.753 m)  Wt 223 lb (101.152 kg)  BMI 32.92 kg/m2  NAD.  A&O x 3.   CTAB RRR Anxious.  Wife present today.  Abd soft, ND, NT.  Obese.     Laboratory Data: RecentLabs       Lab Results  Component Value Date   WBC 6.8 11/21/2014   HCT 45.2 11/21/2014   MCV 95 11/21/2014   PLT 247 11/21/2014      RecentLabs       Lab Results  Component Value Date   CREATININE 0.91 06/27/2015      PSA history:      4.5 ng/dL on 10/19/2013.      4K score of 22% on 12/15/2013      3.9 ng/mL on 06/28/2014      3.9 ng/mL on 12/29/2014      5.8 ng/Dl on 06/26/15       6.0 ng/dL on 06/30/15       5.6 ng/dL on 09/29/15        Pertinent imaging CLINICAL DATA: Followup right renal mass.  EXAM: CT ABDOMEN AND PELVIS WITHOUT AND WITH CONTRAST  TECHNIQUE: Multidetector CT imaging of the abdomen and pelvis was performed following the standard protocol before and following the bolus administration of intravenous contrast.  CONTRAST: 168mL OMNIPAQUE IOHEXOL 350 MG/ML SOLN  COMPARISON: 12/26/2014 and 06/23/2014  FINDINGS: Lower chest: No acute findings.  Hepatobiliary: Mild diffuse hepatic steatosis noted. No liver masses are identified. Gallbladder is unremarkable.  Pancreas: No mass, inflammatory changes, or other significant abnormality.  Spleen: Within normal limits in size and appearance.  Adrenals/Urinary Tract: No adrenal masses identified. Small mass in the anterior midpole the right kidney which shows evidence of contrast enhancement is seen on image 71/series 5. This shows mild increase in size, currently measuring 2.2 x 2.1 cm compared to 1.6 x 1.6 cm previously. This is suspicious for a small renal cell carcinoma. No other renal masses identified. No evidence hydronephrosis. Lower urinary tract including urinary bladder are unremarkable in appearance.  Stomach/Bowel: No evidence of obstruction, inflammatory process, or abnormal fluid collections. Colonic diverticulosis is noted, however there is no evidence  of diverticulitis.  Vascular/Lymphatic: No pathologically enlarged lymph nodes. No evidence of abdominal aortic aneurysm.  Reproductive: Mildly enlarged prostate remains stable.  Other: None.  Musculoskeletal: No suspicious bone  lesions identified.  IMPRESSION: Mild enlargement of 2.2 cm enhancing mass in anterior midpole of right kidney, highly suspicious for renal cell carcinoma. Consider abdomen MRI without and with contrast for further characterization.  No evidence of abdominal or pelvic metastatic disease.  Incidental findings including mildly enlarged prostate, colonic diverticulosis, and mild hepatic steatosis.   Electronically Signed  By: Earle Gell M.D.  On: 06/22/2015 16:13    Assessment & Plan:    1. Right renal mass:    Patient's renal mass has increased from 1.61.6 cm to 2.22.1 cm, enhancing right anterior midpole mass concerning for renal cell carcinoma. we revisit his options today including continued surveillance, ablative therapy, and robotic partial nephrectomy. We again reviewed the risks and benefits of each modality. He has decided to proceed with robotic partial nephrectomy.  We again today reviewed the preop, intraop, and post op course.  All risks reviewed including risk of bleeding, urine leak, infection damage to surrounding structure, renal dysfunction, need for conversion to radical vs. Open nephrectomy, pain, and other known surgical complications for major surgical procedures.    2. Elevated PSA:     Most recent PSA 5.6. Overall, over the past few years since been steadily rising. S/p negative bx 10/09/15.  3. BPH (benign prostatic hyperplasia) with LUTS:   IPSS 7/2. Stable.     [name], MD  [facility] [address] [place], [place] [address] [phone]

## 2015-12-12 NOTE — Op Note (Signed)
PREOPERATIVE DIAGNOSIS: Right renal mass  POSTOPERATIVE DIAGNOSIS:  1. Right renal mass.  OPERATION PERFORMED: 1. Robotic assisted laparoscopic right partial nephrectomy. 2. Intraoperative ultrasound with interpretation.  SURGEON: [name], MD   Assistant: Link Snuffer, PA  FINDINGS:  Hilar anatomy: 1 artery  1 vein  Warm ischemia time: 14 min.  SPECIMENS: 1. Renal mass.  2. Fat overlying tumor  BLOOD LOSS: 100 cc  Drains: 1. JP drain 2. 16 Fr Foley   INDICATIONS FOR OPERATION: Right renal mass   DESCRIPTION OF OPERATION: Informed consent was obtained. The patient was marked on the right side and then taken to the operating room, placed supine on the operating table. General anesthesia was provided. SCDs were provided for DVT prophylaxis and IV antibiotics for bacterial prophylaxis. Foley catheter was placed to drain the bladder. OG tube was placed by anesthesia. The patient was then positioned in left lateral decubitus position with the right flank elevated about 70 degrees. All pressure points were carefully padded. The table was flexed slightly. The right arm was placed in a padded support. The patient was secured to the table with soft chest, hip, and lower extremity straps. The patient was prepped and draped in the usual sterile fashion. We had a time-out confirming the patient identification, the planned procedure, the surgical site, and all present were in agreement.   A Veress needle was placed just lateral to the right rectus belly at the level of the umbilicus. Aspiration, irrigation, and saline drop test confirmed good position. Opening pressure was less than 9 mmHg. The abdomen was insufflated to 15 mmHg. Following this, trocar sites were mapped out with two robotic trocars triangulating the expected location of the tumor, a 12 mm trocar between, at about the midclavicular line, for the camera, a 12 mm midline trocar for the assistant just  above the umbilicus as well as a 5 mm just below the xiphoid, and an 28mm trocar in the right lower quadrant for the 4th arm.  I used a visual obturator to place a 5 mm trocar at one of the robotic port sites, and the peritoneal cavity was surveyed. Remaining trocars were placed under laparoscopic visualization. I used a Minette Headland device to place 0 Vicryl sutures at the 12 mm trocar sites for closure at the end of the case. The robot was docked. I placed monopolar shears in the right arm, bipolar in the left, and a double fenestrated grasper in the 4th arm.   The white line of Toldt was incised and the right colon was Reflected.  The tail of Gerota's fascia was lifted up and the ureter and gonadal vein were identified. The gonadal vein was left medial and the ureter was lifted up. The psoas muscle was identified under the ureter and I followed this plane towards the renal hilum. The hilar vessels were identified and exposed circumferentially.     At this point, I incised Gerota's fascia and defatted the kidney in the region of the tumor. Once the tumor was exposed, intraoperative ultrasound was performed. The mass was imaged and measured, and then excision markings were made. 12.5 gm of mannitol and 10 IV lasix were given, and then bulldog clamps were placed on the renal artery. Vein was left unclamped. Warm ischemia time was clocked. The renal mass was sharply excised and immediately placed in a 10 mm endocatch bag.  The first layer of the renorrhaphy was closed with running V-Loc Suture x1, anchored at each end with a Lapra-Ty. A  sliding clip renorrhaphy was performed to close the outer layer, using interrupted 2-0 vicryl suture, Weck clips, and Lapra-Ty's. The capsule edges were nicely approximated.  The bulldog clamps were removed. Warm ischemia time was 14 minutes. Additional 12.5 gm of mannitol was given. All needles were extracted. The kidney was well perfused and the  renorrhaphy was hemostatic. I reduced the pneumoperitoneal pressure to 7 mmHg and confirmed hemostasis throughout the surgical field. Tisseel was applied over the kidney repair and floseal at the hilum. Gerota's fascia was closed with running 3-0 Vicryl suture.   A 19 round JP drain was placed lateral to the kidney. Liver was inspected to rule out injury. Omentum was inspected to make sure no bleeding from extensive lysis of adhesions. Trocars were removed under direct vision. The tumor was extracted and sent for pathology. At the 12 mm trocar sites, the 0 Vicryl sutures that had been placed at the start of the case were tied down securely. The extraction site was closed with running #0 Vicryl sutures. All the wounds were copiously irrigated and then infiltrated with local anesthetic. The skin edges were re approximated with 4-0 Monocryl. Dermabond was applied.   All sponge, needle, and instrument counts were reported correct. The patient was awakened from anesthesia and transferred to recovery in stable condition. There were no complications and the patient tolerated the procedure well. Operative events were discussed with the patient's family.   [name], MD

## 2015-12-12 NOTE — Anesthesia Preprocedure Evaluation (Incomplete)
Anesthesia Evaluation  ° ° °Airway ° ° ° ° ° ° ° Dental °  °Pulmonary ° °  ° ° ° ° ° ° ° Cardiovascular ° ° ° °  °Neuro/Psych °  ° GI/Hepatic °  °Endo/Other  °diabetes ° Renal/GU °  ° °  °Musculoskeletal ° ° Abdominal °  °Peds ° Hematology °  °Anesthesia Other Findings ° ° Reproductive/Obstetrics ° °  ° ° ° ° ° ° ° ° ° ° ° ° ° °  °  ° ° ° ° ° ° ° ° °Anesthesia Physical °Anesthesia Plan °Anesthesia Quick Evaluation ° °

## 2015-12-12 NOTE — Addendum Note (Signed)
Addendum  created 12/12/15 1524 by Alvin Critchley, MD   Anesthesia Event edited

## 2015-12-12 NOTE — Anesthesia Procedure Notes (Signed)
Procedure Name: Intubation Date/Time: 12/12/2015 8:58 AM Performed by: Rosaria Ferries, Lorece Keach Pre-anesthesia Checklist: Patient identified, Emergency Drugs available, Suction available and Patient being monitored Patient Re-evaluated:Patient Re-evaluated prior to inductionOxygen Delivery Method: Circle system utilized Preoxygenation: Pre-oxygenation with 100% oxygen Intubation Type: IV induction Laryngoscope Size: Mac and 3 Grade View: Grade I Tube size: 7.0 mm Number of attempts: 1 Placement Confirmation: ETT inserted through vocal cords under direct vision,  positive ETCO2 and breath sounds checked- equal and bilateral Secured at: 22 cm Tube secured with: Tape Dental Injury: Teeth and Oropharynx as per pre-operative assessment

## 2015-12-12 NOTE — Progress Notes (Signed)
Blood sugar 266 from 290  Dr Kayleen Memos called before taking pt to floor   Pt feeling much better

## 2015-12-12 NOTE — Progress Notes (Signed)
Patient has had insulin twice   First 5 units then 6 units novolog   Dr Kayleen Memos  States to recheck fingerstick in one hour

## 2015-12-12 NOTE — Progress Notes (Signed)
Remains nauseated  Phenergan 6.25 given

## 2015-12-12 NOTE — Anesthesia Preprocedure Evaluation (Signed)
Anesthesia Evaluation  Patient identified by MRN, date of birth, ID band Patient awake    Reviewed: Allergy & Precautions, NPO status , Patient's Chart, lab work & pertinent test results, reviewed documented beta blocker date and time   Airway Mallampati: II  TM Distance: >3 FB     Dental no notable dental hx. (+) Chipped   Pulmonary neg pulmonary ROS,    Pulmonary exam normal        Cardiovascular hypertension, Pt. on medications and Pt. on home beta blockers Normal cardiovascular exam     Neuro/Psych  Headaches, negative psych ROS   GI/Hepatic negative GI ROS, Neg liver ROS,   Endo/Other  diabetes, Well Controlled, Type 2, Oral Hypoglycemic Agents  Renal/GU Renal diseaseRenal cell carcinoma  negative genitourinary   Musculoskeletal  (+) Arthritis , Osteoarthritis,    Abdominal Normal abdominal exam  (+)   Peds negative pediatric ROS (+)  Hematology negative hematology ROS (+)   Anesthesia Other Findings   Reproductive/Obstetrics                             Anesthesia Physical Anesthesia Plan  ASA: III  Anesthesia Plan: General   Post-op Pain Management:    Induction: Intravenous  Airway Management Planned: Oral ETT  Additional Equipment:   Intra-op Plan:   Post-operative Plan: Extubation in OR  Informed Consent: I have reviewed the patients History and Physical, chart, labs and discussed the procedure including the risks, benefits and alternatives for the proposed anesthesia with the patient or authorized representative who has indicated his/her understanding and acceptance.   Dental advisory given  Plan Discussed with: CRNA and Surgeon  Anesthesia Plan Comments:         Anesthesia Quick Evaluation

## 2015-12-12 NOTE — Transfer of Care (Signed)
Immediate Anesthesia Transfer of Care Note  Patient: Jason Contreras  Procedure(s) Performed: Procedure(s): ROBOTIC ASSITED PARTIAL NEPHRECTOMY (Right)  Patient Location: PACU  Anesthesia Type:General  Level of Consciousness: sedated  Airway & Oxygen Therapy: Patient Spontanous Breathing and Patient connected to nasal cannula oxygen  Post-op Assessment: Report given to RN and Post -op Vital signs reviewed and stable  Post vital signs: Reviewed and stable  Last Vitals:  Vitals:   12/12/15 0756  BP: 134/62  Pulse: 61  Resp: 16  Temp: 36.7 C    Last Pain:  Vitals:   12/12/15 0756  TempSrc: Oral         Complications: No apparent anesthesia complications

## 2015-12-12 NOTE — Anesthesia Postprocedure Evaluation (Signed)
Anesthesia Post Note  Patient: Jason Contreras  Procedure(s) Performed: Procedure(s) (LRB): ROBOTIC ASSITED PARTIAL NEPHRECTOMY (Right)  Patient location during evaluation: PACU Anesthesia Type: General Level of consciousness: awake and alert and oriented Pain management: pain level controlled Vital Signs Assessment: post-procedure vital signs reviewed and stable Respiratory status: spontaneous breathing Cardiovascular status: blood pressure returned to baseline Anesthetic complications: no    Last Vitals:  Vitals:   12/12/15 0756  BP: 134/62  Pulse: 61  Resp: 16  Temp: 36.7 C    Last Pain:  Vitals:   12/12/15 0756  TempSrc: Oral                 Anyla Israelson

## 2015-12-13 ENCOUNTER — Encounter: Payer: Self-pay | Admitting: Urology

## 2015-12-13 LAB — GLUCOSE, CAPILLARY
GLUCOSE-CAPILLARY: 136 mg/dL — AB (ref 65–99)
GLUCOSE-CAPILLARY: 146 mg/dL — AB (ref 65–99)
Glucose-Capillary: 161 mg/dL — ABNORMAL HIGH (ref 65–99)
Glucose-Capillary: 129 mg/dL — ABNORMAL HIGH (ref 65–99)

## 2015-12-13 LAB — CBC
HEMATOCRIT: 39.2 % — AB (ref 40.0–52.0)
Hemoglobin: 13.8 g/dL (ref 13.0–18.0)
MCH: 33.3 pg (ref 26.0–34.0)
MCHC: 35.3 g/dL (ref 32.0–36.0)
MCV: 94.5 fL (ref 80.0–100.0)
PLATELETS: 172 10*3/uL (ref 150–440)
RBC: 4.15 MIL/uL — ABNORMAL LOW (ref 4.40–5.90)
RDW: 13.3 % (ref 11.5–14.5)
WBC: 10.1 10*3/uL (ref 3.8–10.6)

## 2015-12-13 LAB — TYPE AND SCREEN
ABO/RH(D): A POS
Antibody Screen: NEGATIVE
Unit division: 0
Unit division: 0

## 2015-12-13 LAB — BASIC METABOLIC PANEL
ANION GAP: 8 (ref 5–15)
BUN: 13 mg/dL (ref 6–20)
CALCIUM: 7.9 mg/dL — AB (ref 8.9–10.3)
CO2: 27 mmol/L (ref 22–32)
CREATININE: 0.87 mg/dL (ref 0.61–1.24)
Chloride: 103 mmol/L (ref 101–111)
Glucose, Bld: 161 mg/dL — ABNORMAL HIGH (ref 65–99)
Potassium: 3.6 mmol/L (ref 3.5–5.1)
SODIUM: 138 mmol/L (ref 135–145)

## 2015-12-13 LAB — CREATININE, FLUID (PLEURAL, PERITONEAL, JP DRAINAGE): Creat, Fluid: 0.8 mg/dL

## 2015-12-13 LAB — PREPARE RBC (CROSSMATCH)

## 2015-12-13 MED ORDER — HEPARIN SODIUM (PORCINE) 5000 UNIT/ML IJ SOLN
5000.0000 [IU] | Freq: Three times a day (TID) | INTRAMUSCULAR | Status: DC
  Administered 2015-12-13 – 2015-12-14 (×3): 5000 [IU] via SUBCUTANEOUS
  Filled 2015-12-13 (×3): qty 1

## 2015-12-13 MED ORDER — PROMETHAZINE HCL 25 MG/ML IJ SOLN
25.0000 mg | Freq: Three times a day (TID) | INTRAMUSCULAR | Status: DC | PRN
  Administered 2015-12-13 (×2): 25 mg via INTRAVENOUS
  Filled 2015-12-13 (×2): qty 1

## 2015-12-13 MED ORDER — ACETAMINOPHEN 10 MG/ML IV SOLN
1000.0000 mg | Freq: Four times a day (QID) | INTRAVENOUS | Status: AC
  Administered 2015-12-13 – 2015-12-14 (×3): 1000 mg via INTRAVENOUS
  Filled 2015-12-13 (×4): qty 100

## 2015-12-13 NOTE — Progress Notes (Signed)
Dr. Erlene Quan notified of pt complaint of chest, shoulder, and neck pain after getting up to chair for first time since surgery. BP (!) 132/57   Pulse 64   Temp 97.2 F (36.2 C) (Tympanic)   Resp 18   Ht 5\' 10"  (1.778 m)   Wt 100.7 kg (222 lb)   SpO2 97%   BMI 31.85 kg/m . MD order for IV tylenol for 24 hours. MD also notified of urine output of 250 ml for shift and dark colored urine. Continue to monitor.

## 2015-12-13 NOTE — Progress Notes (Signed)
Called Dr. Matilde Sprang regarding additional nausea medication.  Appropriate orders were placed.  Phoebe Sharps N  12/13/2015  2:01 AM

## 2015-12-13 NOTE — Progress Notes (Signed)
1 Day Post-Op Subjective: Significant nausea without vomiting overnight which persists this morning. Has not been able to eat today. Pain at incision sites.  Objective: Vital signs in last 24 hours: Temp:  [97.4 F (36.3 C)-98.8 F (37.1 C)] 98.8 F (37.1 C) (08/09 0457) Pulse Rate:  [61-98] 95 (08/09 0457) Resp:  [12-18] 18 (08/09 0457) BP: (121-148)/(58-85) 144/73 (08/09 0457) SpO2:  [94 %-100 %] 99 % (08/09 0457) Arterial Line BP: (144-163)/(74-115) 156/81 (08/08 1400) FiO2 (%):  [2 %] 2 % (08/08 1400)  Intake/Output from previous day: 08/08 0701 - 08/09 0700 In: 3300 [I.V.:3300] Out: 4120 [Urine:3800; Emesis/NG output:70; Drains:150; Blood:100] Intake/Output this shift: No intake/output data recorded.  Physical Exam:  General: Alert and oriented. CV: RRR Lungs: Clear bilaterally. GI: Soft, Nondistended. Incisions: Clean and dry. Urine: Clear, Foley in place Extremities: Nontender, no erythema, no edema.  Lab Results:  Recent Labs  12/12/15 1617 12/13/15 0555  HGB 14.5 13.8  HCT 41.3 39.2*          Recent Labs  12/12/15 1617 12/13/15 0555  CREATININE 0.88 0.87           Assessment/Plan: POD# 1 s/p robotic partial nephrectomy.  1) Ambulate, Incentive spirometry 2) Advance diet as tolerated 3) Transition to oral pain medication 4) D/C Foley tomorrow 5) Drain Cr today   [name], MD   LOS: 1 day   Yovanni Mcglinchey 12/13/2015, 1:02 PM

## 2015-12-14 LAB — BASIC METABOLIC PANEL
Anion gap: 3 — ABNORMAL LOW (ref 5–15)
BUN: 13 mg/dL (ref 6–20)
CHLORIDE: 108 mmol/L (ref 101–111)
CO2: 28 mmol/L (ref 22–32)
CREATININE: 0.88 mg/dL (ref 0.61–1.24)
Calcium: 7.7 mg/dL — ABNORMAL LOW (ref 8.9–10.3)
Glucose, Bld: 143 mg/dL — ABNORMAL HIGH (ref 65–99)
POTASSIUM: 3.9 mmol/L (ref 3.5–5.1)
SODIUM: 139 mmol/L (ref 135–145)

## 2015-12-14 LAB — CBC
HCT: 35.6 % — ABNORMAL LOW (ref 40.0–52.0)
HEMOGLOBIN: 12.6 g/dL — AB (ref 13.0–18.0)
MCH: 33.7 pg (ref 26.0–34.0)
MCHC: 35.5 g/dL (ref 32.0–36.0)
MCV: 94.9 fL (ref 80.0–100.0)
PLATELETS: 138 10*3/uL — AB (ref 150–440)
RBC: 3.75 MIL/uL — AB (ref 4.40–5.90)
RDW: 13 % (ref 11.5–14.5)
WBC: 6.2 10*3/uL (ref 3.8–10.6)

## 2015-12-14 LAB — GLUCOSE, CAPILLARY
GLUCOSE-CAPILLARY: 151 mg/dL — AB (ref 65–99)
GLUCOSE-CAPILLARY: 171 mg/dL — AB (ref 65–99)

## 2015-12-14 MED ORDER — OXYCODONE-ACETAMINOPHEN 5-325 MG PO TABS: 1.0000 | ORAL_TABLET | ORAL | 0 refills | Status: DC | PRN

## 2015-12-14 MED ORDER — DOCUSATE SODIUM 100 MG PO CAPS: 100.0000 mg | ORAL_CAPSULE | Freq: Two times a day (BID) | ORAL | 0 refills | Status: DC

## 2015-12-14 NOTE — Discharge Summary (Signed)
Date of admission: 12/12/2015  Date of discharge: 12/14/2015  Admission diagnosis: right renal mass  Discharge diagnosis: same  Secondary diagnoses:  Patient Active Problem List   Diagnosis Date Noted  . Right renal mass 12/12/2015  . Elevated PSA 01/07/2015  . BPH with obstruction/lower urinary tract symptoms 01/07/2015  . Renal mass 01/07/2015  . Diabetes mellitus without complication ([place])   . Hypertension   . Hyperlipidemia     History and Physical: For full details, please see admission history and physical. Briefly, Jason Contreras is a 75 y.o. year old patient with right renal mass admitted following robotic right partial nephrectomy.   Hospital Course: Patient tolerated the procedure well.  He was then transferred to the floor after an uneventful PACU stay.  His hospital course was uncomplicated.  On POD#2 he had met discharge criteria: was eating a regular diet, passing flatus, was up and ambulating independently,  pain was well controlled, was voiding without a catheter, and was ready to for discharge.   Laboratory values:   Physical Exam  Constitutional: He is oriented to person, place, and time. He appears well-developed and well-nourished.  HENT:  Head: Normocephalic.  Cardiovascular: Normal rate.   Pulmonary/Chest: Effort normal and breath sounds normal.  Abdominal: Soft.  Appropriately tender, nondistended, wounds c/d/i.    Genitourinary: Penis normal.  Musculoskeletal: He exhibits no edema.  Neurological: He is alert and oriented to person, place, and time.  Skin: Skin is warm and dry.  Psychiatric: He has a normal mood and affect.     Recent Labs  12/12/15 1617 12/13/15 0555 12/14/15 0425  WBC 9.4 10.1 6.2  HGB 14.5 13.8 12.6*  HCT 41.3 39.2* 35.6*    Recent Labs  12/12/15 1617 12/13/15 0555 12/14/15 0425  NA 136 138 139  K 4.0 3.6 3.9  CL 102 103 108  CO2 _0 GLUCOSE 250* 161* 143*  BUN _1 CREATININE 0.88 0.87 0.88  CALCIUM  8.4* 7.9* 7.7*   No results for input(s): LABPT, INR in the last 72 hours. No results for input(s): LABURIN in the last 72 hours. Results for orders placed or performed during the hospital encounter of 12/12/15  Urine culture     Status: None   Collection Time: 12/06/15 11:48 AM  Result Value Ref Range Status   Specimen Description URINE, CLEAN CATCH  Final   Special Requests Normal  Final   Culture NO GROWTH Performed at [facility]   Final   Report Status 12/07/2015 FINAL  Final    Disposition: Home  Discharge instruction: The patient was instructed to be ambulatory but told to refrain from heavy lifting, strenuous activity, or driving while taking narcotics.    Discharge medications:    Medication List    TAKE these medications   amLODipine 5 MG tablet Commonly known as:  NORVASC Take 1 tablet (5 mg total) by mouth daily.   atorvastatin 20 MG tablet Commonly known as:  LIPITOR Take 1 tablet (20 mg total) by mouth daily at 6 PM.   carvedilol 12.5 MG tablet Commonly known as:  COREG Take 1 tablet (12.5 mg total) by mouth 2 (two) times daily.   docusate sodium 100 MG capsule Commonly known as:  COLACE Take 1 capsule (100 mg total) by mouth 2 (two) times daily.   gabapentin 300 MG capsule Commonly known as:  NEURONTIN Take 1 capsule (300 mg total) by mouth 4 (four) times daily. What changed:  when  to take this   glimepiride 4 MG tablet Commonly known as:  AMARYL Take 0.5 tablets (2 mg total) by mouth daily.   losartan 100 MG tablet Commonly known as:  COZAAR Take 1 tablet (100 mg total) by mouth daily.   metFORMIN 500 MG tablet Commonly known as:  GLUCOPHAGE Take 2 tablets (1,000 mg total) by mouth 2 (two) times daily.   oxyCODONE-acetaminophen 5-325 MG tablet Commonly known as:  PERCOCET Take 1-2 tablets by mouth every 4 (four) hours as needed for moderate pain or severe pain.   pioglitazone 45 MG tablet Commonly known as:  ACTOS Take 1 tablet  (45 mg total) by mouth daily.   vitamin B-12 100 MCG tablet Commonly known as:  CYANOCOBALAMIN Take 100 mcg by mouth daily.       Followup:  Follow-up Information    [name], MD.   Specialty:  Urology Why:  post op Contact information: [address] [place] [address] [phone]

## 2015-12-14 NOTE — Progress Notes (Signed)
Alert and oriented. Vital signs stable . No signs of acute distress. Discharge instructions given. Patient verbalizes understanding. No other issues noted at this time.   

## 2015-12-14 NOTE — Care Management Important Message (Signed)
Important Message  Patient Details  Name: UKIAH SUIRE MRN: [id number] Date of Birth: Dec 27, 1940   Medicare Important Message Given:  N/A - LOS <3 / Initial given by admissions    Beverly Sessions, RN 12/14/2015, 3:52 PM

## 2015-12-14 NOTE — Discharge Instructions (Signed)
·   Activity:  You are encouraged to ambulate frequently (about every hour during waking hours) to help prevent blood clots from forming in your legs or lungs.  However, you should not engage in any heavy lifting (> 5-10 lbs), strenuous activity, or straining. ° °· Diet: You should advance your diet as instructed by your physician.  It will be normal to have some bloating, nausea, and abdominal discomfort intermittently. ° °· Prescriptions:  You will be provided a prescription for pain medication to take as needed.  If your pain is not severe enough to require the prescription pain medication, you may take extra strength Tylenol instead which will have less side effects.  You should also take a prescribed stool softener to avoid straining with bowel movements as the prescription pain medication may constipate you. ° °· Incisions: You may remove your dressing bandages 48 hours after surgery if not removed in the hospital.  You will either have some small staples or special tissue glue at each of the incision sites. Once the bandages are removed (if present), the incisions may stay open to air.  You may start showering (but not soaking or bathing in water) the 2nd day after surgery and the incisions simply need to be patted dry after the shower.  No additional care is needed. ° °What to call us about: You should call the office if you develop fever > 101 or develop persistent vomiting, redness or draining around your incision, or any other concerning symptoms.   ° °[facility] °[address], [address] °, [place] [address] °[phone] ° ° °

## 2015-12-19 ENCOUNTER — Telehealth: Payer: Self-pay | Admitting: Urology

## 2015-12-19 LAB — SURGICAL PATHOLOGY

## 2015-12-19 NOTE — Telephone Encounter (Signed)
Pathology reviewed with the patient. Consistent with RCC, Fuhrman grade 2, margins negative, pT1.  [name], MD

## 2015-12-27 ENCOUNTER — Encounter: Payer: Self-pay | Admitting: Family Medicine

## 2015-12-27 ENCOUNTER — Other Ambulatory Visit: Payer: Self-pay

## 2015-12-27 ENCOUNTER — Ambulatory Visit (INDEPENDENT_AMBULATORY_CARE_PROVIDER_SITE_OTHER): Payer: Medicare Other | Admitting: Family Medicine

## 2015-12-27 VITALS — BP 152/81 | HR 60 | Temp 98.0°F | Ht 69.0 in | Wt 220.0 lb

## 2015-12-27 DIAGNOSIS — N401 Enlarged prostate with lower urinary tract symptoms: Secondary | ICD-10-CM

## 2015-12-27 DIAGNOSIS — N138 Other obstructive and reflux uropathy: Secondary | ICD-10-CM

## 2015-12-27 DIAGNOSIS — E119 Type 2 diabetes mellitus without complications: Secondary | ICD-10-CM | POA: Diagnosis not present

## 2015-12-27 DIAGNOSIS — I10 Essential (primary) hypertension: Secondary | ICD-10-CM | POA: Diagnosis not present

## 2015-12-27 DIAGNOSIS — C641 Malignant neoplasm of right kidney, except renal pelvis: Secondary | ICD-10-CM

## 2015-12-27 DIAGNOSIS — C649 Malignant neoplasm of unspecified kidney, except renal pelvis: Secondary | ICD-10-CM | POA: Insufficient documentation

## 2015-12-27 LAB — HEMOGLOBIN A1C: Hemoglobin A1C: 6.3

## 2015-12-27 LAB — BAYER DCA HB A1C WAIVED: HB A1C (BAYER DCA - WAIVED): 6.3 % (ref <7.0)

## 2015-12-27 NOTE — Assessment & Plan Note (Signed)
Healing well from surgery

## 2015-12-27 NOTE — Progress Notes (Signed)
   BP (!) 152/81 (BP Location: Left Arm, Patient Position: Sitting, Cuff Size: Normal)   Pulse 60   Temp 98 F (36.7 C)   Ht 5\' 9"  (1.753 m)   Wt 220 lb (99.8 kg) Comment: with shoes  SpO2 98%   BMI 32.49 kg/m    Subjective:    Patient ID: Jason Contreras, male    DOB: 1941/03/31, 75 y.o.   MRN: [id number]  HPI: Jason Contreras is a 75 y.o. male  Patient just out of hospital for right nephrectomy due to renal cancer has done well recovering well from surgery. His had full follow-up with physicals etc. reviewed blood work from hospital and all essentially normal just needs hemoglobin A1c. Elevated PSA and prostate exams being done by urology. Patient with no low blood sugar spells or issues   Relevant past medical, surgical, family and social history reviewed and updated as indicated. Interim medical history since our last visit reviewed. Allergies and medications reviewed and updated.  Review of Systems  Constitutional: Negative.   Respiratory: Negative.   Cardiovascular: Negative.     Per HPI unless specifically indicated above     Objective:    BP (!) 152/81 (BP Location: Left Arm, Patient Position: Sitting, Cuff Size: Normal)   Pulse 60   Temp 98 F (36.7 C)   Ht 5\' 9"  (1.753 m)   Wt 220 lb (99.8 kg) Comment: with shoes  SpO2 98%   BMI 32.49 kg/m   Wt Readings from Last 3 Encounters:  12/27/15 220 lb (99.8 kg)  12/12/15 222 lb (100.7 kg)  11/06/15 222 lb (100.7 kg)    Physical Exam  Constitutional: He is oriented to person, place, and time. He appears well-developed and well-nourished. No distress.  HENT:  Head: Normocephalic and atraumatic.  Right Ear: Hearing normal.  Left Ear: Hearing normal.  Nose: Nose normal.  Eyes: Conjunctivae and lids are normal. Right eye exhibits no discharge. Left eye exhibits no discharge. No scleral icterus.  Cardiovascular: Normal rate, regular rhythm and normal heart sounds.   Pulmonary/Chest: Effort normal and breath sounds  normal. No respiratory distress.  Abdominal:  Surgical wounds healing well.  Musculoskeletal: Normal range of motion.  Neurological: He is alert and oriented to person, place, and time.  Skin: Skin is intact. No rash noted.  Psychiatric: He has a normal mood and affect. His speech is normal and behavior is normal. Judgment and thought content normal. Cognition and memory are normal.        Assessment & Plan:   Problem List Items Addressed This Visit      Cardiovascular and Mediastinum   Hypertension    The current medical regimen is effective;  continue present plan and medications.         Endocrine   Diabetes mellitus without complication ([place]) - Primary    Diabetes good control with surgical changes probably influencing good control discuss continue weight loss diet exercise nutrition changes.      Relevant Orders   Bayer DCA Hb A1c Waived     Genitourinary   BPH with obstruction/lower urinary tract symptoms    Followed by urology      Adenocarcinoma, renal cell ([place])    Healing well from surgery       Other Visit Diagnoses   None.      Follow up plan: Return in about 3 months (around 03/28/2016) for Hemoglobin A1c.

## 2015-12-27 NOTE — Assessment & Plan Note (Signed)
Followed by urology.   

## 2015-12-27 NOTE — Assessment & Plan Note (Signed)
The current medical regimen is effective;  continue present plan and medications.  

## 2015-12-27 NOTE — Assessment & Plan Note (Signed)
Diabetes good control with surgical changes probably influencing good control discuss continue weight loss diet exercise nutrition changes.

## 2016-01-12 ENCOUNTER — Ambulatory Visit (INDEPENDENT_AMBULATORY_CARE_PROVIDER_SITE_OTHER): Payer: Medicare Other | Admitting: Urology

## 2016-01-12 ENCOUNTER — Encounter: Payer: Self-pay | Admitting: Urology

## 2016-01-12 VITALS — BP 119/69 | HR 84 | Ht 69.0 in | Wt 217.3 lb

## 2016-01-12 DIAGNOSIS — N138 Other obstructive and reflux uropathy: Secondary | ICD-10-CM

## 2016-01-12 DIAGNOSIS — N401 Enlarged prostate with lower urinary tract symptoms: Secondary | ICD-10-CM

## 2016-01-12 DIAGNOSIS — D3001 Benign neoplasm of right kidney: Secondary | ICD-10-CM | POA: Diagnosis not present

## 2016-01-12 DIAGNOSIS — R972 Elevated prostate specific antigen [PSA]: Secondary | ICD-10-CM

## 2016-01-12 NOTE — Progress Notes (Signed)
01/12/16   Jason Contreras 23-Dec-1940 [id number]  Referring provider: [name], MD [address] [place], [place] [address]  Chief Complaint  Patient presents with  . Follow-up    HPI: [name] is a 75 year old male with a history of elevated PSA s/p recent negative prostate biopsy, a right renal mass s/p right partial nephrectomy ~4 weeks ago and BPH with LUTS.   Elevated PSA Patient was found to have a PSA of 4.5 ng/mL on 10/19/2013. A 4K score conducted on 12/15/2013 noted a 22% probability of having a Gleason's 7 or higher prostate cancer. PSA was 3.9 ng/mL on 12/29/2014, 5.8 on 06/26/15 and 6.0 on 06/30/15.  Repeat PSA on 09/29/15 5.6. He is found to have an enlarged prostate on DRE, but no nodules have been palpated.    He has a family history of PCa, with father having been diagnosed with PCa with metastatic diease.    S/p negative biopsy on 10/09/15 which had 2 foci of HG PIN, otherwise no malignancy.  TRUS volume 41 cc.    Right renal mass Patient has a slow growing right anterior interpolar lesion that most likely is representing an indolent growth of renal cell carcinoma. It has now increased from 1.6 cm x 1.6 cm to 2.2 cm x 2.1 cm in size on the anterior right midpole, enhancing on CT scan.      He underwent robotic partial nephrectomy on 12/12/2015 which showed a 1.8 cm papillary renal cell carcinoma, margins negative, Fuhrman grade 2 (pT1a).    Postoperatively, he has no issues. His incisions have healed well.  BPH WITH LUTS Most recent IPSS score 7, which is mild lower urinary tract symptomatology. He is mostly satisfied with his quality life due to his urinary symptoms.  His major complaint today frequent urination and nocturia 1.  He has had these symptoms for many years.  He denies any dysuria, hematuria or suprapubic pain.  He also denies any recent fevers, chills, nausea or vomiting.  No change today.     PMH: Past Medical History:  Diagnosis Date  .  Adenocarcinoma, renal cell ([place])   . Arthritis   . Diabetes mellitus without complication ([place])   . Elevated PSA   . Headache   . Hematuria   . Hyperlipidemia   . Hypertension   . Neuropathy ([place])   . Obesity     Surgical History: Past Surgical History:  Procedure Laterality Date  . APPENDECTOMY    . ROBOTIC ASSITED PARTIAL NEPHRECTOMY Right 12/12/2015   Procedure: ROBOTIC ASSITED PARTIAL NEPHRECTOMY;  Surgeon: Jason Espy, MD;  Location: [facility] ORS;  Service: Urology;  Laterality: Right;    Home Medications:    Medication List       Accurate as of 01/12/16  1:48 PM. Always use your most recent med list.          amLODipine 5 MG tablet Commonly known as:  NORVASC Take 1 tablet (5 mg total) by mouth daily.   atorvastatin 20 MG tablet Commonly known as:  LIPITOR Take 1 tablet (20 mg total) by mouth daily at 6 PM.   carvedilol 12.5 MG tablet Commonly known as:  COREG Take 1 tablet (12.5 mg total) by mouth 2 (two) times daily.   gabapentin 300 MG capsule Commonly known as:  NEURONTIN Take 1 capsule (300 mg total) by mouth 4 (four) times daily.   glimepiride 4 MG tablet Commonly known as:  AMARYL Take 0.5 tablets (2  mg total) by mouth daily.   losartan 100 MG tablet Commonly known as:  COZAAR Take 1 tablet (100 mg total) by mouth daily.   metFORMIN 500 MG tablet Commonly known as:  GLUCOPHAGE Take 2 tablets (1,000 mg total) by mouth 2 (two) times daily.   pioglitazone 45 MG tablet Commonly known as:  ACTOS Take 1 tablet (45 mg total) by mouth daily.   vitamin B-12 100 MCG tablet Commonly known as:  CYANOCOBALAMIN Take 100 mcg by mouth daily.       Allergies:  Allergies  Allergen Reactions  . Morphine And Related Nausea And Vomiting    Family History: Family History  Problem Relation Age of Onset  . Cancer Mother     BONE  . Stroke Brother   . Kidney disease Neg Hx     Social History:  reports that he has never smoked. He has never used  smokeless tobacco. He reports that he does not drink alcohol or use drugs.   Physical Exam: BP 119/69 (BP Location: Left Arm, Patient Position: Sitting, Cuff Size: Large)   Pulse 84   Ht 5\' 9"  (1.753 m)   Wt 217 lb 4.8 oz (98.6 kg)   BMI 32.09 kg/m   NAD.  A&O x 3.  No increased work of breathing, rest for distress.  Abd soft, ND, NT.  Incisions healing well, without erythema or drainage. No lower extra minute edema.   Laboratory Data: Lab Results  Component Value Date   WBC 6.2 12/14/2015   HGB 12.6 (L) 12/14/2015   HCT 35.6 (L) 12/14/2015   MCV 94.9 12/14/2015   PLT 138 (L) 12/14/2015    Lab Results  Component Value Date   CREATININE 0.88 12/14/2015    PSA history:      4.5 ng/dL on 10/19/2013.      4K score of 22% on 12/15/2013      3.9 ng/mL on 06/28/2014      3.9 ng/mL on 12/29/2014      5.8 ng/Dl on 06/26/15       6.0 ng/dL on 06/30/15       5.6 ng/dL on 09/29/15        Pertinent imaging n/a  Assessment & Plan:    1. Right renal mass:     pT1a RCC s/p right robotic partial nephrectomy on 12/12/15 No post op issues Plan for surveillance chest x-ray/CT scan in 6 months  2. Elevated PSA:     Most recent PSA 5.6 s/p negative biopsy 10/2015 (HgPIN) Will continue to follow with PSA/ DRE in 6 months- if stable, will decrease interval  3. BPH (benign prostatic hyperplasia) with LUTS:    Stable  Return in about 6 months (around 07/11/2016) for PSA/ DRE, f/u CT/ CXR.   Jason Espy, MD  [facility] [address] [place], [place] [address] [phone]

## 2016-01-13 LAB — CBC
HEMATOCRIT: 44.1 % (ref 37.5–51.0)
HEMOGLOBIN: 14.7 g/dL (ref 12.6–17.7)
MCH: 31.7 pg (ref 26.6–33.0)
MCHC: 33.3 g/dL (ref 31.5–35.7)
MCV: 95 fL (ref 79–97)
Platelets: 254 10*3/uL (ref 150–379)
RBC: 4.64 x10E6/uL (ref 4.14–5.80)
RDW: 13.6 % (ref 12.3–15.4)
WBC: 6.4 10*3/uL (ref 3.4–10.8)

## 2016-01-13 LAB — BASIC METABOLIC PANEL
BUN/Creatinine Ratio: 13 (ref 10–24)
BUN: 12 mg/dL (ref 8–27)
CALCIUM: 9.7 mg/dL (ref 8.6–10.2)
CO2: 25 mmol/L (ref 18–29)
CREATININE: 0.9 mg/dL (ref 0.76–1.27)
Chloride: 99 mmol/L (ref 96–106)
GFR calc Af Amer: 98 mL/min/{1.73_m2} (ref >59)
GFR calc non Af Amer: 85 mL/min/{1.73_m2} (ref >59)
GLUCOSE: 204 mg/dL — AB (ref 65–99)
Potassium: 5.3 mmol/L — ABNORMAL HIGH (ref 3.5–5.2)
SODIUM: 141 mmol/L (ref 134–144)

## 2016-02-26 ENCOUNTER — Other Ambulatory Visit: Payer: Self-pay | Admitting: Family Medicine

## 2016-03-25 ENCOUNTER — Ambulatory Visit (INDEPENDENT_AMBULATORY_CARE_PROVIDER_SITE_OTHER): Payer: Medicare Other | Admitting: Family Medicine

## 2016-03-25 ENCOUNTER — Encounter: Payer: Self-pay | Admitting: Family Medicine

## 2016-03-25 VITALS — BP 135/80 | HR 75 | Temp 97.8°F | Wt 218.0 lb

## 2016-03-25 DIAGNOSIS — Z23 Encounter for immunization: Secondary | ICD-10-CM

## 2016-03-25 DIAGNOSIS — E119 Type 2 diabetes mellitus without complications: Secondary | ICD-10-CM | POA: Diagnosis not present

## 2016-03-25 DIAGNOSIS — C641 Malignant neoplasm of right kidney, except renal pelvis: Secondary | ICD-10-CM

## 2016-03-25 DIAGNOSIS — I10 Essential (primary) hypertension: Secondary | ICD-10-CM | POA: Diagnosis not present

## 2016-03-25 LAB — BAYER DCA HB A1C WAIVED: HB A1C: 7.4 % — AB (ref <7.0)

## 2016-03-25 LAB — MICROALBUMIN, URINE WAIVED
CREATININE, URINE WAIVED: 100 mg/dL (ref 10–300)
MICROALB, UR WAIVED: 10 mg/L (ref 0–19)

## 2016-03-25 MED ORDER — MOMETASONE FUROATE 0.1 % EX CREA: 1.0000 "application " | TOPICAL_CREAM | Freq: Every day | CUTANEOUS | 0 refills | Status: DC

## 2016-03-25 NOTE — Assessment & Plan Note (Signed)
The current medical regimen is effective;  continue present plan and medications.  

## 2016-03-25 NOTE — Assessment & Plan Note (Signed)
Recovering from that nephrectomy and healing well.

## 2016-03-25 NOTE — Progress Notes (Signed)
BP 135/80   Pulse 75   Temp 97.8 F (36.6 C)   Wt 218 lb (98.9 kg)   SpO2 98%   BMI 32.19 kg/m    Subjective:    Patient ID: Jason Contreras, male    DOB: [birth year], 75 y.o.   MRN: [id number]  HPI: Jason Contreras is a 75 y.o. male  Chief Complaint  Patient presents with  . Diabetes    patient to call and sch dm eye exam    Relevant past medical, surgical, family and social history reviewed and updated as indicated. Interim medical history since our last visit reviewed. Allergies and medications reviewed and updated.  Review of Systems  Constitutional: Negative.   Respiratory: Negative.   Cardiovascular: Negative.     Per HPI unless specifically indicated above     Objective:    BP 135/80   Pulse 75   Temp 97.8 F (36.6 C)   Wt 218 lb (98.9 kg)   SpO2 98%   BMI 32.19 kg/m   Wt Readings from Last 3 Encounters:  03/25/16 218 lb (98.9 kg)  01/12/16 217 lb 4.8 oz (98.6 kg)  12/27/15 220 lb (99.8 kg)    Physical Exam  Constitutional: He is oriented to person, place, and time. He appears well-developed and well-nourished. No distress.  HENT:  Head: Normocephalic and atraumatic.  Right Ear: Hearing normal.  Left Ear: Hearing normal.  Nose: Nose normal.  Eyes: Conjunctivae and lids are normal. Right eye exhibits no discharge. Left eye exhibits no discharge. No scleral icterus.  Cardiovascular: Normal rate, regular rhythm and normal heart sounds.   Pulmonary/Chest: Effort normal and breath sounds normal. No respiratory distress.  Musculoskeletal: Normal range of motion.  Neurological: He is alert and oriented to person, place, and time.  Skin: Skin is intact. No rash noted.  Psychiatric: He has a normal mood and affect. His speech is normal and behavior is normal. Judgment and thought content normal. Cognition and memory are normal.    Results for orders placed or performed in visit on [id number]  Basic metabolic panel  Result Value Ref Range   Glucose 204 (H)  65 - 99 mg/dL   BUN 12 8 - 27 mg/dL   Creatinine, Ser 0.90 0.76 - 1.27 mg/dL   GFR calc non Af Amer 85 >59 mL/min/1.73   GFR calc Af Amer 98 >59 mL/min/1.73   BUN/Creatinine Ratio 13 10 - 24   Sodium 141 134 - 144 mmol/L   Potassium 5.3 (H) 3.5 - 5.2 mmol/L   Chloride 99 96 - 106 mmol/L   CO2 25 18 - 29 mmol/L   Calcium 9.7 8.6 - 10.2 mg/dL  CBC  Result Value Ref Range   WBC 6.4 3.4 - 10.8 x10E3/uL   RBC 4.64 4.14 - 5.80 x10E6/uL   Hemoglobin 14.7 12.6 - 17.7 g/dL   Hematocrit 44.1 37.5 - 51.0 %   MCV 95 79 - 97 fL   MCH 31.7 26.6 - 33.0 pg   MCHC 33.3 31.5 - 35.7 g/dL   RDW 13.6 12.3 - 15.4 %   Platelets 254 150 - 379 x10E3/uL      Assessment & Plan:   Problem List Items Addressed This Visit      Cardiovascular and Mediastinum   Hypertension    The current medical regimen is effective;  continue present plan and medications.         Endocrine   DM due to underlying condition with diabetic nephropathy (  [place]) - Primary    The current medical regimen is effective;  continue present plan and medications.         Genitourinary   Adenocarcinoma, renal cell ([place])    Recovering from that nephrectomy and healing well.       Other Visit Diagnoses    Needs flu shot       Encounter for immunization       Relevant Orders   Flu vaccine HIGH DOSE PF (Completed)       Follow up plan: Return in about 3 months (around 06/25/2016) for Hemoglobin A1c.

## 2016-05-03 DIAGNOSIS — H2511 Age-related nuclear cataract, right eye: Secondary | ICD-10-CM | POA: Diagnosis not present

## 2016-05-03 LAB — HM DIABETES EYE EXAM

## 2016-06-03 ENCOUNTER — Other Ambulatory Visit: Payer: Self-pay | Admitting: Family Medicine

## 2016-06-25 ENCOUNTER — Encounter: Payer: Self-pay | Admitting: Family Medicine

## 2016-06-25 ENCOUNTER — Ambulatory Visit (INDEPENDENT_AMBULATORY_CARE_PROVIDER_SITE_OTHER): Payer: Medicare Other | Admitting: Family Medicine

## 2016-06-25 VITALS — BP 126/70 | HR 78 | Ht 69.0 in | Wt 224.0 lb

## 2016-06-25 DIAGNOSIS — E0821 Diabetes mellitus due to underlying condition with diabetic nephropathy: Secondary | ICD-10-CM

## 2016-06-25 DIAGNOSIS — I10 Essential (primary) hypertension: Secondary | ICD-10-CM | POA: Diagnosis not present

## 2016-06-25 DIAGNOSIS — E785 Hyperlipidemia, unspecified: Secondary | ICD-10-CM | POA: Diagnosis not present

## 2016-06-25 LAB — BAYER DCA HB A1C WAIVED: HB A1C: 6.8 % (ref <7.0)

## 2016-06-25 MED ORDER — GLIMEPIRIDE 4 MG PO TABS
2.0000 mg | ORAL_TABLET | Freq: Every day | ORAL | 1 refills | Status: DC
Start: 2016-06-25 — End: 2017-01-13

## 2016-06-25 MED ORDER — PIOGLITAZONE HCL 45 MG PO TABS: 45.0000 mg | ORAL_TABLET | Freq: Every day | ORAL | 1 refills | Status: DC

## 2016-06-25 MED ORDER — MOMETASONE FUROATE 0.1 % EX CREA: 1.0000 "application " | TOPICAL_CREAM | Freq: Every day | CUTANEOUS | 0 refills | Status: DC

## 2016-06-25 MED ORDER — LOSARTAN POTASSIUM 100 MG PO TABS: 100.0000 mg | ORAL_TABLET | Freq: Every day | ORAL | 1 refills | Status: DC

## 2016-06-25 MED ORDER — ATORVASTATIN CALCIUM 20 MG PO TABS: 20.0000 mg | ORAL_TABLET | Freq: Every day | ORAL | 1 refills | Status: DC

## 2016-06-25 MED ORDER — GABAPENTIN 300 MG PO CAPS: 300.0000 mg | ORAL_CAPSULE | Freq: Four times a day (QID) | ORAL | 1 refills | Status: DC

## 2016-06-25 MED ORDER — METFORMIN HCL 500 MG PO TABS: 1000.0000 mg | ORAL_TABLET | Freq: Two times a day (BID) | ORAL | 1 refills | Status: DC

## 2016-06-25 MED ORDER — CARVEDILOL 12.5 MG PO TABS: 12.5000 mg | ORAL_TABLET | Freq: Two times a day (BID) | ORAL | 1 refills | Status: DC

## 2016-06-25 NOTE — Progress Notes (Signed)
BP 126/70 (BP Location: Left Arm)   Pulse 78   Ht 5\' 9"  (1.753 m)   Wt 224 lb (101.6 kg)   SpO2 98%   BMI 33.08 kg/m    Subjective:    Patient ID: Jason Contreras, male    DOB: 1940/09/14, 76 y.o.   MRN: [id number]  HPI: Jason Contreras is a 76 y.o. male  Chief Complaint  Patient presents with  . Follow-up  . Diabetes  Patient follow-up diabetes doing okay except not dieting blood sugars doing okay Getting good reports from kidney and prostate issues with with follow-up appointments coming up next month. Cholesterol doing well with medications no issues Blood pressure doing well Relevant past medical, surgical, family and social history reviewed and updated as indicated. Interim medical history since our last visit reviewed. Allergies and medications reviewed and updated.  Review of Systems  Constitutional: Negative.   Respiratory: Negative.   Cardiovascular: Negative.     Per HPI unless specifically indicated above     Objective:    BP 126/70 (BP Location: Left Arm)   Pulse 78   Ht 5\' 9"  (1.753 m)   Wt 224 lb (101.6 kg)   SpO2 98%   BMI 33.08 kg/m   Wt Readings from Last 3 Encounters:  06/25/16 224 lb (101.6 kg)  03/25/16 218 lb (98.9 kg)  01/12/16 217 lb 4.8 oz (98.6 kg)    Physical Exam  Constitutional: He is oriented to person, place, and time. He appears well-developed and well-nourished. No distress.  HENT:  Head: Normocephalic and atraumatic.  Right Ear: Hearing normal.  Left Ear: Hearing normal.  Nose: Nose normal.  Eyes: Conjunctivae and lids are normal. Right eye exhibits no discharge. Left eye exhibits no discharge. No scleral icterus.  Cardiovascular: Normal rate, regular rhythm and normal heart sounds.   Pulmonary/Chest: Effort normal and breath sounds normal. No respiratory distress.  Musculoskeletal: Normal range of motion.  Neurological: He is alert and oriented to person, place, and time.  Skin: Skin is intact. No rash noted.  Psychiatric:  He has a normal mood and affect. His speech is normal and behavior is normal. Judgment and thought content normal. Cognition and memory are normal.    Results for orders placed or performed in visit on 05/25/16  HM DIABETES EYE EXAM  Result Value Ref Range   HM Diabetic Eye Exam No Retinopathy No Retinopathy      Assessment & Plan:   Problem List Items Addressed This Visit      Cardiovascular and Mediastinum   Hypertension - Primary   Relevant Medications   atorvastatin (LIPITOR) 20 MG tablet   carvedilol (COREG) 12.5 MG tablet   losartan (COZAAR) 100 MG tablet   Other Relevant Orders   Bayer DCA Hb A1c Waived     Endocrine   DM due to underlying condition with diabetic nephropathy ([facility])   Relevant Medications   atorvastatin (LIPITOR) 20 MG tablet   gabapentin (NEURONTIN) 300 MG capsule   glimepiride (AMARYL) 4 MG tablet   losartan (COZAAR) 100 MG tablet   metFORMIN (GLUCOPHAGE) 500 MG tablet   pioglitazone (ACTOS) 45 MG tablet   Other Relevant Orders   Bayer DCA Hb A1c Waived     Other   Hyperlipidemia   Relevant Medications   atorvastatin (LIPITOR) 20 MG tablet   carvedilol (COREG) 12.5 MG tablet   losartan (COZAAR) 100 MG tablet   Other Relevant Orders   Bayer DCA Hb A1c Waived  Follow up plan: Return in about 3 months (around 09/22/2016) for BMP,  Lipids, ALT, AST, Hemoglobin A1c.

## 2016-07-11 ENCOUNTER — Ambulatory Visit
Admission: RE | Admit: 2016-07-11 | Discharge: 2016-07-11 | Disposition: A | Discharge status: Home / Self Care | Payer: Medicare Other | Source: Ambulatory Visit | Attending: Urology | Admitting: Urology

## 2016-07-11 DIAGNOSIS — K760 Fatty (change of) liver, not elsewhere classified: Secondary | ICD-10-CM | POA: Insufficient documentation

## 2016-07-11 DIAGNOSIS — I2510 Atherosclerotic heart disease of native coronary artery without angina pectoris: Secondary | ICD-10-CM | POA: Insufficient documentation

## 2016-07-11 DIAGNOSIS — Z905 Acquired absence of kidney: Secondary | ICD-10-CM | POA: Diagnosis not present

## 2016-07-11 DIAGNOSIS — Z9889 Other specified postprocedural states: Secondary | ICD-10-CM | POA: Insufficient documentation

## 2016-07-11 DIAGNOSIS — D3001 Benign neoplasm of right kidney: Secondary | ICD-10-CM

## 2016-07-11 DIAGNOSIS — K5730 Diverticulosis of large intestine without perforation or abscess without bleeding: Secondary | ICD-10-CM | POA: Insufficient documentation

## 2016-07-11 DIAGNOSIS — I700 Atherosclerosis of aorta: Secondary | ICD-10-CM | POA: Diagnosis not present

## 2016-07-11 LAB — POCT I-STAT CREATININE: CREATININE: 0.8 mg/dL (ref 0.61–1.24)

## 2016-07-11 MED ORDER — IOPAMIDOL (ISOVUE-370) INJECTION 76%
100.0000 mL | Freq: Once | INTRAVENOUS | Status: AC | PRN
Start: 2016-07-11 — End: 2016-07-11
  Administered 2016-07-11: 100 mL via INTRAVENOUS

## 2016-07-12 ENCOUNTER — Encounter: Payer: Self-pay | Admitting: Urology

## 2016-07-12 ENCOUNTER — Ambulatory Visit: Payer: Medicare Other | Admitting: Urology

## 2016-07-12 VITALS — BP 149/68 | HR 65 | Ht 70.0 in | Wt 222.0 lb

## 2016-07-12 DIAGNOSIS — R972 Elevated prostate specific antigen [PSA]: Secondary | ICD-10-CM | POA: Diagnosis not present

## 2016-07-12 DIAGNOSIS — N138 Other obstructive and reflux uropathy: Secondary | ICD-10-CM

## 2016-07-12 DIAGNOSIS — N401 Enlarged prostate with lower urinary tract symptoms: Secondary | ICD-10-CM | POA: Diagnosis not present

## 2016-07-12 DIAGNOSIS — D3001 Benign neoplasm of right kidney: Secondary | ICD-10-CM | POA: Diagnosis not present

## 2016-07-12 MED ORDER — TAMSULOSIN HCL 0.4 MG PO CAPS: 0.4000 mg | ORAL_CAPSULE | Freq: Every day | ORAL | 11 refills | Status: DC

## 2016-07-12 NOTE — Progress Notes (Signed)
07/12/16   Jason Contreras 11-Oct-1940 [id number]  Referring provider: [name], MD [address] [place], [place] [address]  Chief Complaint  Patient presents with  . Elevated PSA    49month RCC follow up    HPI: Mr. Careaga is a 76 year old male with a history of elevated PSA s/p recent negative prostate biopsy, a right renal mass s/p right partial nephrectomy 12/2015 Who returns today for 6 month follow-up.  Elevated PSA Patient was found to have a PSA of 4.5 ng/mL on 10/19/2013. A 4K score conducted on 12/15/2013 noted a 22% probability of having a Gleason's 7 or higher prostate cancer. PSA was 3.9 ng/mL on 12/29/2014, 5.8 on 06/26/15 and 6.0 on 06/30/15.  Repeat PSA on 09/29/15 5.6. He is found to have an enlarged prostate on DRE, but no nodules have been palpated.    He has a family history of PCa, with father having been diagnosed with PCa with metastatic diease in his 14s.    S/p negative biopsy on 10/09/15 which had 2 foci of HG PIN, otherwise no malignancy.  TRUS volume 41 cc.    PSA repeated today, rectal exam deferred  Right renal mass Underwent robotic partial nephrectomy on 12/12/2015 which showed a 1.8 cm papillary renal cell carcinoma, margins negative, Fuhrman grade 2 (pT1a).    Follow-up CT abdomen and pelvis as well as chest x-ray negative today at 6 month interval.  No postoperative issues other than occasional rare mild right flank pain which he associates with drinking too much fluid.  BPH WITH LUTS History of lower urinary tract symptoms, IPSS mild, majority complaint today frequent urination and nocturia 1.  He has had these symptoms for many years.  He denies any dysuria, hematuria or suprapubic pain.  He also denies any recent fevers, chills, nausea or vomiting.  No change today.      He was given flomax by his PCP which helped him start his stream.  He would like to resume this medication.     PMH: Past Medical History:  Diagnosis Date  .  Adenocarcinoma, renal cell ([place])   . Arthritis   . Diabetes mellitus without complication ([place])   . Elevated PSA   . Headache   . Hematuria   . Hyperlipidemia   . Hypertension   . Neuropathy ([place])   . Obesity     Surgical History: Past Surgical History:  Procedure Laterality Date  . APPENDECTOMY    . ROBOTIC ASSITED PARTIAL NEPHRECTOMY Right 12/12/2015   Procedure: ROBOTIC ASSITED PARTIAL NEPHRECTOMY;  Surgeon: [name], MD;  Location: [facility] ORS;  Service: Urology;  Laterality: Right;    Home Medications:  Allergies as of 07/12/2016      Reactions   Morphine And Related Nausea And Vomiting      Medication List       Accurate as of 07/12/16 12:56 PM. Always use your most recent med list.          atorvastatin 20 MG tablet Commonly known as:  LIPITOR Take 1 tablet (20 mg total) by mouth daily at 6 PM.   carvedilol 12.5 MG tablet Commonly known as:  COREG Take 1 tablet (12.5 mg total) by mouth 2 (two) times daily.   gabapentin 300 MG capsule Commonly known as:  NEURONTIN Take 1 capsule (300 mg total) by mouth 4 (four) times daily.   glimepiride 4 MG tablet Commonly known as:  AMARYL Take 0.5 tablets (2 mg total) by  mouth daily.   losartan 100 MG tablet Commonly known as:  COZAAR Take 1 tablet (100 mg total) by mouth daily.   metFORMIN 500 MG tablet Commonly known as:  GLUCOPHAGE Take 2 tablets (1,000 mg total) by mouth 2 (two) times daily.   mometasone 0.1 % cream Commonly known as:  ELOCON Apply 1 application topically daily.   pioglitazone 45 MG tablet Commonly known as:  ACTOS Take 1 tablet (45 mg total) by mouth daily.   tamsulosin 0.4 MG Caps capsule Commonly known as:  FLOMAX Take 1 capsule (0.4 mg total) by mouth daily.   vitamin B-12 100 MCG tablet Commonly known as:  CYANOCOBALAMIN Take 100 mcg by mouth daily.       Allergies:  Allergies  Allergen Reactions  . Morphine And Related Nausea And Vomiting    Family History: Family  History  Problem Relation Age of Onset  . Cancer Mother     BONE  . Stroke Brother   . Kidney disease Neg Hx     Social History:  reports that he has never smoked. He has never used smokeless tobacco. He reports that he does not drink alcohol or use drugs.   Physical Exam: BP (!) 149/68   Pulse 65   Ht 5\' 10"  (1.778 m)   Wt 222 lb (100.7 kg)   BMI 31.85 kg/m   NAD.  A&O x 3.  Focally intact, no neurological deficits. No increased work of breathing, respiratory distress.  Abd soft, ND, NT.  Incisions healede healed scar.   Obese. No CVA tenderness. No LE edema.   Laboratory Data: Lab Results  Component Value Date   WBC 6.4 01/12/2016   HGB 12.6 (L) 12/14/2015   HCT 44.1 01/12/2016   MCV 95 01/12/2016   PLT 254 01/12/2016    Lab Results  Component Value Date   CREATININE 0.80 07/11/2016    PSA history:      4.5 ng/dL on 10/19/2013.      4K score of 22% on 12/15/2013      3.9 ng/mL on 06/28/2014      3.9 ng/mL on 12/29/2014      5.8 ng/Dl on 06/26/15       6.0 ng/dL on 06/30/15       5.6 ng/dL on 09/29/15      Pertinent imaging CLINICAL DATA:  76 year old male with history of renal cell adenoma on the right diagnosed 3 years ago, presenting with occasional right-sided abdominal pain and frequent urination since right-sided partial nephrectomy. Followup study.  EXAM: CT ABDOMEN AND PELVIS WITHOUT AND WITH CONTRAST  TECHNIQUE: Multidetector CT imaging of the abdomen and pelvis was performed following the standard protocol before and following the bolus administration of intravenous contrast.  CONTRAST:  100 mL of Isovue 370.  COMPARISON:  CT the abdomen and pelvis 06/22/2015.  FINDINGS: Lower chest: Atherosclerotic calcifications in the left anterior descending, left circumflex and right coronary arteries.  Hepatobiliary: Mild diffuse low attenuation throughout the hepatic parenchyma, compatible with a background of hepatic steatosis. No cystic  or solid hepatic lesions. No intra or extrahepatic biliary ductal dilatation. Gallbladder is normal in appearance.  Pancreas: No pancreatic mass. No pancreatic ductal dilatation. No pancreatic or peripancreatic fluid or inflammatory changes.  Spleen: Unremarkable.  Adrenals/Urinary Tract: There is a focal area of architectural distortion and small postoperative fluid collection anterior to the lower pole of the left kidney at the site of prior partial nephrectomy. This collection measures 1.7 cm (axial image 68  of series 6). There is no enhancing soft tissue in this region to suggest residual or local recurrence of disease on today's examination. Remaining portions of the right kidney are normal in appearance. Left kidney and bilateral adrenal glands are normal in appearance. There is no hydroureteronephrosis. Urinary bladder is normal in appearance.  Stomach/Bowel: The appearance of the stomach is normal. There is no pathologic dilatation of small bowel or colon. Numerous colonic diverticulae are noted, most severe in the sigmoid colon, without surrounding inflammatory changes to suggest an acute diverticulitis at this time. Normal appendix.  Vascular/Lymphatic: Aortic atherosclerosis, without evidence of aneurysm or dissection in the abdominal or pelvic vasculature. No lymphadenopathy noted in the abdomen or pelvis.  Reproductive: Prostate gland and seminal vesicles are unremarkable in appearance.  Other: No significant volume of ascites.  No pneumoperitoneum.  Musculoskeletal: There are no aggressive appearing lytic or blastic lesions noted in the visualized portions of the skeleton.  IMPRESSION: 1. Postoperative changes of prior right-sided partial nephrectomy, with small amount of postoperative fluid adjacent to the nephrectomy bad, but no definite findings to suggest residual/recurrent disease on today's examination. Today's study will serve as a new  baseline for future followup studies. 2. Mild hepatic steatosis. 3. Aortic atherosclerosis, in addition to at least 3 vessel coronary artery disease. Assessment for potential risk factor modification, dietary therapy or pharmacologic therapy may be warranted, if clinically indicated. 4. Colonic diverticulosis without evidence of acute diverticulitis at this time.   Electronically Signed   By: Vinnie Langton M.D.   On: 07/11/2016 14:05  CLINICAL DATA:  History of renal cell carcinoma resected 12/12/2015.  EXAM: CHEST 1 VIEW  COMPARISON:  None.  FINDINGS: The lungs are clear. Heart size is normal. No pneumothorax or pleural fluid. Aortic atherosclerosis is noted. No bony abnormality.  IMPRESSION: No acute abnormality.  Negative for metastatic disease.  Aortic atherosclerosis.   Electronically Signed   By: Inge Rise M.D.   On: 07/11/2016 14:35  CT scan and chest x-ray are personally reviewed today with the patient  Assessment & Plan:    1. Right renal mass:     pT1a RCC s/p right robotic partial nephrectomy on 12/12/15 NED today on CT abd/ pelvis and CXR Plan for surveillance chest x-ray/CT scan in 12 months- will follow annually for a total of 3 years  2. Elevated PSA:      Most recent PSA 5.6 s/p negative biopsy 10/2015 (HgPIN) PSA repeated today We discussed the appropriate age for cessation of PSA screening, given his family history of prostate cancer, may consider screening until age 41  3. BPH (benign prostatic hyperplasia) with LUTS:    Resume flomax- script sent to pharmacy  Return in about 1 year (around 07/12/2017) for CT abd/ pelvis, CXR.   [name], MD  [facility] [address] [place], [place] [address] [phone]

## 2016-07-13 LAB — PSA: Prostate Specific Ag, Serum: 5.1 ng/mL — ABNORMAL HIGH (ref 0.0–4.0)

## 2016-07-15 ENCOUNTER — Telehealth: Payer: Self-pay

## 2016-07-15 NOTE — Telephone Encounter (Signed)
-----   Message from [name], MD sent at 07/15/2016  9:52 AM EDT ----- Please call patient with PSA results.  Looks stable/ slightly lower than previous values.  Great news.  [name], MD

## 2016-07-15 NOTE — Telephone Encounter (Signed)
Spoke with pt in reference to PSA results. Pt voiced understanding.  

## 2016-09-26 ENCOUNTER — Ambulatory Visit (INDEPENDENT_AMBULATORY_CARE_PROVIDER_SITE_OTHER): Payer: Medicare Other | Admitting: Family Medicine

## 2016-09-26 ENCOUNTER — Encounter: Payer: Self-pay | Admitting: Family Medicine

## 2016-09-26 VITALS — BP 119/74 | HR 78 | Wt 223.0 lb

## 2016-09-26 DIAGNOSIS — D692 Other nonthrombocytopenic purpura: Secondary | ICD-10-CM | POA: Diagnosis not present

## 2016-09-26 DIAGNOSIS — E0821 Diabetes mellitus due to underlying condition with diabetic nephropathy: Secondary | ICD-10-CM | POA: Diagnosis not present

## 2016-09-26 DIAGNOSIS — E785 Hyperlipidemia, unspecified: Secondary | ICD-10-CM

## 2016-09-26 DIAGNOSIS — R5383 Other fatigue: Secondary | ICD-10-CM | POA: Diagnosis not present

## 2016-09-26 DIAGNOSIS — I10 Essential (primary) hypertension: Secondary | ICD-10-CM

## 2016-09-26 LAB — LP+ALT+AST PICCOLO, WAIVED
ALT (SGPT) PICCOLO, WAIVED: 18 U/L (ref 10–47)
AST (SGOT) PICCOLO, WAIVED: 19 U/L (ref 11–38)
CHOL/HDL RATIO PICCOLO,WAIVE: 2.7 mg/dL
Cholesterol Piccolo, Waived: 110 mg/dL (ref <200)
HDL CHOL PICCOLO, WAIVED: 41 mg/dL — AB (ref >59)
LDL Chol Calc Piccolo Waived: 47 mg/dL (ref <100)
TRIGLYCERIDES PICCOLO,WAIVED: 112 mg/dL (ref <150)
VLDL Chol Calc Piccolo,Waive: 22 mg/dL (ref <30)

## 2016-09-26 LAB — CBC WITH DIFFERENTIAL/PLATELET
Hematocrit: 42.4 % (ref 37.5–51.0)
Hemoglobin: 14.3 g/dL (ref 13.0–17.7)
LYMPHS ABS: 1.3 10*3/uL (ref 0.7–3.1)
Lymphs: 28 %
MCH: 32.4 pg (ref 26.6–33.0)
MCHC: 33.7 g/dL (ref 31.5–35.7)
MCV: 96 fL (ref 79–97)
MID (ABSOLUTE): 0.4 10*3/uL (ref 0.1–1.6)
MID: 9 %
NEUTROS ABS: 2.8 10*3/uL (ref 1.4–7.0)
Neutrophils: 63 %
PLATELETS: 209 10*3/uL (ref 150–379)
RBC: 4.41 x10E6/uL (ref 4.14–5.80)
RDW: 14 % (ref 12.3–15.4)
WBC: 4.5 10*3/uL (ref 3.4–10.8)

## 2016-09-26 LAB — BAYER DCA HB A1C WAIVED: HB A1C: 5.9 % (ref <7.0)

## 2016-09-26 NOTE — Progress Notes (Signed)
BP 119/74   Pulse 78   Wt 223 lb (101.2 kg)   SpO2 98%   BMI 32.00 kg/m    Subjective:    Patient ID: Jason Contreras, male    DOB: [birth year], 76 y.o.   MRN: [id number]  HPI: [name] is a 76 y.o. male  Chief Complaint  Patient presents with  . Follow-up  . Hypertension  . Diabetes  . Hyperlipidemia  Patient's biggest concern is fatigue with minimal exertion. Patient gets tired and draggy with just going out to dinner are going to the grocery store. Can barely make it through the grocery store has develops marked fatigue. Patient feels just tired no chest symptoms there are no other known diaphoresis nausea radiation or other cardiac-type symptoms. Patient states she just needs some energy. No noticed bleeding does have some intermittent bruising on his arms with minor trauma. Symptoms seem worse since partial nephrectomy. Otherwise diabetes blood pressure cholesterol seems to be doing okay  Relevant past medical, surgical, family and social history reviewed and updated as indicated. Interim medical history since our last visit reviewed. Allergies and medications reviewed and updated.  Review of Systems  Constitutional: Positive for activity change and fatigue. Negative for fever and unexpected weight change.  Respiratory: Negative.   Cardiovascular: Negative.     Per HPI unless specifically indicated above     Objective:    BP 119/74   Pulse 78   Wt 223 lb (101.2 kg)   SpO2 98%   BMI 32.00 kg/m   Wt Readings from Last 3 Encounters:  09/26/16 223 lb (101.2 kg)  07/12/16 222 lb (100.7 kg)  06/25/16 224 lb (101.6 kg)    Physical Exam  Constitutional: He is oriented to person, place, and time. He appears well-developed and well-nourished.  HENT:  Head: Normocephalic and atraumatic.  Eyes: Conjunctivae and EOM are normal.  Neck: Normal range of motion.  Cardiovascular: Normal rate, regular rhythm and normal heart sounds.   Pulmonary/Chest: Effort normal and  breath sounds normal.  Musculoskeletal: Normal range of motion.  Neurological: He is alert and oriented to person, place, and time.  Skin: No erythema.  Psychiatric: He has a normal mood and affect. His behavior is normal. Judgment and thought content normal.    Results for orders placed or performed in visit on 07/12/16  PSA  Result Value Ref Range   Prostate Specific Ag, Serum 5.1 (H) 0.0 - 4.0 ng/mL      Assessment & Plan:   Problem List Items Addressed This Visit      Cardiovascular and Mediastinum   Hypertension - Primary    The current medical regimen is effective;  continue present plan and medications.       Relevant Orders   LP+ALT+AST Piccolo, Waived   Bayer DCA Hb A1c Waived   Basic metabolic panel   Senile purpura (HCC)    Comes and goes        Endocrine   DM due to underlying condition with diabetic nephropathy ([place])    The current medical regimen is effective;  continue present plan and medications.       Relevant Orders   LP+ALT+AST Piccolo, Waived   Bayer DCA Hb A1c Waived   Basic metabolic panel     Other   Hyperlipidemia    Discussed with patient after patient left the office that fatigue may possibly be related to cholesterol medication will hold for a week or 2 and observe symptoms. Then  restart medication and observe symptoms.       Relevant Orders   LP+ALT+AST Piccolo, Waived   Bayer DCA Hb A1c Waived   Basic metabolic panel   Fatigue    Patient's fatigue on review CBC normal with no anemia. Hemoglobin A1c and cholesterol all doing well. With patient's history have to be concerned about coronary artery disease and angina equivalents. Patient has a cardiologist relationship through his daughter in [place]. He will explore that cardiology referral and if not will make referral here promptly.      Relevant Orders   CBC With Differential/Platelet       Follow up plan: Return in about 3 months (around 12/27/2016) for Physical Exam,  Hemoglobin A1c.

## 2016-09-26 NOTE — Assessment & Plan Note (Signed)
Comes and goes 

## 2016-09-26 NOTE — Assessment & Plan Note (Signed)
The current medical regimen is effective;  continue present plan and medications.  

## 2016-09-26 NOTE — Assessment & Plan Note (Addendum)
Discussed with patient after patient left the office that fatigue may possibly be related to cholesterol medication will hold for a week or 2 and observe symptoms. Then restart medication and observe symptoms.

## 2016-09-26 NOTE — Assessment & Plan Note (Signed)
Patient's fatigue on review CBC normal with no anemia. Hemoglobin A1c and cholesterol all doing well. With patient's history have to be concerned about coronary artery disease and angina equivalents. Patient has a cardiologist relationship through his daughter in [place]. He will explore that cardiology referral and if not will make referral here promptly.

## 2016-09-27 LAB — BASIC METABOLIC PANEL
BUN/Creatinine Ratio: 21 (ref 10–24)
BUN: 17 mg/dL (ref 8–27)
CALCIUM: 9.3 mg/dL (ref 8.6–10.2)
CO2: 25 mmol/L (ref 18–29)
Chloride: 102 mmol/L (ref 96–106)
Creatinine, Ser: 0.81 mg/dL (ref 0.76–1.27)
GFR calc Af Amer: 102 mL/min/{1.73_m2} (ref >59)
GFR, EST NON AFRICAN AMERICAN: 88 mL/min/{1.73_m2} (ref >59)
Glucose: 123 mg/dL — ABNORMAL HIGH (ref 65–99)
POTASSIUM: 4.8 mmol/L (ref 3.5–5.2)
Sodium: 141 mmol/L (ref 134–144)

## 2016-10-01 ENCOUNTER — Encounter: Payer: Self-pay | Admitting: Family Medicine

## 2016-10-07 ENCOUNTER — Telehealth: Payer: Self-pay | Admitting: Family Medicine

## 2016-10-07 NOTE — Telephone Encounter (Signed)
Patients daughter [name] called to see if patient needs a referral for cardiology out of state with [facility]  Can you advise.    Thanks  Mio

## 2016-10-08 NOTE — Telephone Encounter (Signed)
As far as I can tell by insurance, no.  Patient would have to by $50 for specialty co-pay for out of network.

## 2016-10-08 NOTE — Telephone Encounter (Signed)
LVM for daughter to return my cal.

## 2016-10-10 NOTE — Telephone Encounter (Signed)
Faxed last OV to [facility] with Dr Clydene Pugh office   Fax number [phone]

## 2016-10-14 DIAGNOSIS — I10 Essential (primary) hypertension: Secondary | ICD-10-CM | POA: Diagnosis not present

## 2016-10-14 DIAGNOSIS — E782 Mixed hyperlipidemia: Secondary | ICD-10-CM | POA: Diagnosis not present

## 2016-10-14 DIAGNOSIS — R5383 Other fatigue: Secondary | ICD-10-CM | POA: Diagnosis not present

## 2016-12-07 ENCOUNTER — Other Ambulatory Visit: Payer: Self-pay | Admitting: Family Medicine

## 2016-12-07 DIAGNOSIS — E0821 Diabetes mellitus due to underlying condition with diabetic nephropathy: Secondary | ICD-10-CM

## 2016-12-09 NOTE — Telephone Encounter (Signed)
Last OV: 09/26/16 Next OV: 01/13/17   Lab Results  Component Value Date   HGBA1C 6.3 12/27/2015

## 2017-01-01 ENCOUNTER — Ambulatory Visit (INDEPENDENT_AMBULATORY_CARE_PROVIDER_SITE_OTHER): Payer: Medicare Other

## 2017-01-01 VITALS — BP 145/77 | HR 60 | Temp 98.3°F | Resp 16 | Ht 68.0 in | Wt 227.9 lb

## 2017-01-01 DIAGNOSIS — Z0000 Encounter for general adult medical examination without abnormal findings: Secondary | ICD-10-CM

## 2017-01-01 NOTE — Patient Instructions (Addendum)
Jason Contreras , Thank you for taking time to come for your Medicare Wellness Visit. I appreciate your ongoing commitment to your health goals. Please review the following plan we discussed and let me know if I can assist you in the future.   Screening recommendations/referrals: Colonoscopy: completed 04/28/2008 Recommended yearly ophthalmology/optometry visit for glaucoma screening and checkup Recommended yearly dental visit for hygiene and checkup  Vaccinations: Influenza vaccine: up to date, due 01/2017 Pneumococcal vaccine: up to date Tdap vaccine: up to date Shingles vaccine: due, check with your insurance company for coverage  Advanced directives:Please bring a copy of your health care power of attorney and living will to the office at your convenience.  Conditions/risks identified: Recommend drinking at least 4-5 glasses of water a day  Next appointment: Follow up on 01/13/2017 at 2:00pm with Dr.Crissman. Follow up in one year for your annual wellness exam  Preventive Care 65 Years and Older, Male Preventive care refers to lifestyle choices and visits with your health care provider that can promote health and wellness. What does preventive care include?  A yearly physical exam. This is also called an annual well check.  Dental exams once or twice a year.  Routine eye exams. Ask your health care provider how often you should have your eyes checked.  Personal lifestyle choices, including:  Daily care of your teeth and gums.  Regular physical activity.  Eating a healthy diet.  Avoiding tobacco and drug use.  Limiting alcohol use.  Practicing safe sex.  Taking low doses of aspirin every day.  Taking vitamin and mineral supplements as recommended by your health care provider. What happens during an annual well check? The services and screenings done by your health care provider during your annual well check will depend on your age, overall health, lifestyle risk factors,  and family history of disease. Counseling  Your health care provider may ask you questions about your:  Alcohol use.  Tobacco use.  Drug use.  Emotional well-being.  Home and relationship well-being.  Sexual activity.  Eating habits.  History of falls.  Memory and ability to understand (cognition).  Work and work Statistician. Screening  You may have the following tests or measurements:  Height, weight, and BMI.  Blood pressure.  Lipid and cholesterol levels. These may be checked every 5 years, or more frequently if you are over 74 years old.  Skin check.  Lung cancer screening. You may have this screening every year starting at age 74 if you have a 30-pack-year history of smoking and currently smoke or have quit within the past 15 years.  Fecal occult blood test (FOBT) of the stool. You may have this test every year starting at age 74.  Flexible sigmoidoscopy or colonoscopy. You may have a sigmoidoscopy every 5 years or a colonoscopy every 10 years starting at age 74.  Prostate cancer screening. Recommendations will vary depending on your family history and other risks.  Hepatitis C blood test.  Hepatitis B blood test.  Sexually transmitted disease (STD) testing.  Diabetes screening. This is done by checking your blood sugar (glucose) after you have not eaten for a while (fasting). You may have this done every 1-3 years.  Abdominal aortic aneurysm (AAA) screening. You may need this if you are a current or former smoker.  Osteoporosis. You may be screened starting at age 74 if you are at high risk. Talk with your health care provider about your test results, treatment options, and if necessary, the need for  more tests. Vaccines  Your health care provider may recommend certain vaccines, such as:  Influenza vaccine. This is recommended every year.  Tetanus, diphtheria, and acellular pertussis (Tdap, Td) vaccine. You may need a Td booster every 10  years.  Zoster vaccine. You may need this after age 74.  Pneumococcal 13-valent conjugate (PCV13) vaccine. One dose is recommended after age 1.  Pneumococcal polysaccharide (PPSV23) vaccine. One dose is recommended after age 11. Talk to your health care provider about which screenings and vaccines you need and how often you need them. This information is not intended to replace advice given to you by your health care provider. Make sure you discuss any questions you have with your health care provider. Document Released: 05/19/2015 Document Revised: 01/10/2016 Document Reviewed: 02/21/2015 Elsevier Interactive Patient Education  2017 [employer] Prevention in the Home Falls can cause injuries. They can happen to people of all ages. There are many things you can do to make your home safe and to help prevent falls. What can I do on the outside of my home?  Regularly fix the edges of walkways and driveways and fix any cracks.  Remove anything that might make you trip as you walk through a door, such as a raised step or threshold.  Trim any bushes or trees on the path to your home.  Use bright outdoor lighting.  Clear any walking paths of anything that might make someone trip, such as rocks or tools.  Regularly check to see if handrails are loose or broken. Make sure that both sides of any steps have handrails.  Any raised decks and porches should have guardrails on the edges.  Have any leaves, snow, or ice cleared regularly.  Use sand or salt on walking paths during winter.  Clean up any spills in your garage right away. This includes oil or grease spills. What can I do in the bathroom?  Use night lights.  Install grab bars by the toilet and in the tub and shower. Do not use towel bars as grab bars.  Use non-skid mats or decals in the tub or shower.  If you need to sit down in the shower, use a plastic, non-slip stool.  Keep the floor dry. Clean up any water that  spills on the floor as soon as it happens.  Remove soap buildup in the tub or shower regularly.  Attach bath mats securely with double-sided non-slip rug tape.  Do not have throw rugs and other things on the floor that can make you trip. What can I do in the bedroom?  Use night lights.  Make sure that you have a light by your bed that is easy to reach.  Do not use any sheets or blankets that are too big for your bed. They should not hang down onto the floor.  Have a firm chair that has side arms. You can use this for support while you get dressed.  Do not have throw rugs and other things on the floor that can make you trip. What can I do in the kitchen?  Clean up any spills right away.  Avoid walking on wet floors.  Keep items that you use a lot in easy-to-reach places.  If you need to reach something above you, use a strong step stool that has a grab bar.  Keep electrical cords out of the way.  Do not use floor polish or wax that makes floors slippery. If you must use wax, use non-skid  floor wax.  Do not have throw rugs and other things on the floor that can make you trip. What can I do with my stairs?  Do not leave any items on the stairs.  Make sure that there are handrails on both sides of the stairs and use them. Fix handrails that are broken or loose. Make sure that handrails are as long as the stairways.  Check any carpeting to make sure that it is firmly attached to the stairs. Fix any carpet that is loose or worn.  Avoid having throw rugs at the top or bottom of the stairs. If you do have throw rugs, attach them to the floor with carpet tape.  Make sure that you have a light switch at the top of the stairs and the bottom of the stairs. If you do not have them, ask someone to add them for you. What else can I do to help prevent falls?  Wear shoes that:  Do not have high heels.  Have rubber bottoms.  Are comfortable and fit you well.  Are closed at the  toe. Do not wear sandals.  If you use a stepladder:  Make sure that it is fully opened. Do not climb a closed stepladder.  Make sure that both sides of the stepladder are locked into place.  Ask someone to hold it for you, if possible.  Clearly mark and make sure that you can see:  Any grab bars or handrails.  First and last steps.  Where the edge of each step is.  Use tools that help you move around (mobility aids) if they are needed. These include:  Canes.  Walkers.  Scooters.  Crutches.  Turn on the lights when you go into a dark area. Replace any light bulbs as soon as they burn out.  Set up your furniture so you have a clear path. Avoid moving your furniture around.  If any of your floors are uneven, fix them.  If there are any pets around you, be aware of where they are.  Review your medicines with your doctor. Some medicines can make you feel dizzy. This can increase your chance of falling. Ask your doctor what other things that you can do to help prevent falls. This information is not intended to replace advice given to you by your health care provider. Make sure you discuss any questions you have with your health care provider. Document Released: 02/16/2009 Document Revised: 09/28/2015 Document Reviewed: 05/27/2014 Elsevier Interactive Patient Education  2017 [employer].

## 2017-01-01 NOTE — Progress Notes (Signed)
Subjective:   Jason Contreras is a 76 y.o. male who presents for Medicare Annual/Subsequent preventive examination.  Review of Systems:  Cardiac Risk Factors include: male gender;advanced age (>22men, >36 women);obesity (BMI >30kg/m2);diabetes mellitus;hypertension;dyslipidemia     Objective:    Vitals: BP (!) 145/77 (BP Location: Right Arm, Patient Position: Sitting)   Pulse 60   Temp 98.3 F (36.8 C)   Resp 16   Ht 5\' 8"  (1.727 m)   Wt 227 lb 14.4 oz (103.4 kg)   BMI 34.65 kg/m   Body mass index is 34.65 kg/m.  Tobacco History  Smoking Status  . Never Smoker  Smokeless Tobacco  . Never Used     Counseling given: Not Answered   Past Medical History:  Diagnosis Date  . Adenocarcinoma, renal cell ([place])   . Arthritis   . Diabetes mellitus without complication ([place])   . Elevated PSA   . Headache   . Hematuria   . Hyperlipidemia   . Hypertension   . Neuropathy   . Obesity    Past Surgical History:  Procedure Laterality Date  . APPENDECTOMY    . ROBOTIC ASSITED PARTIAL NEPHRECTOMY Right 12/12/2015   Procedure: ROBOTIC ASSITED PARTIAL NEPHRECTOMY;  Surgeon: [name], MD;  Location: [facility] ORS;  Service: Urology;  Laterality: Right;   Family History  Problem Relation Age of Onset  . Cancer Mother        BONE  . Stroke Brother   . Kidney disease Neg Hx    History  Sexual Activity  . Sexual activity: Not on file    Outpatient Encounter Prescriptions as of 01/01/2017  Medication Sig  . atorvastatin (LIPITOR) 20 MG tablet Take 1 tablet (20 mg total) by mouth daily at 6 PM.  . carvedilol (COREG) 12.5 MG tablet Take 1 tablet (12.5 mg total) by mouth 2 (two) times daily.  [name] gabapentin (NEURONTIN) 300 MG capsule Take 1 capsule (300 mg total) by mouth 4 (four) times daily.  [name] glimepiride (AMARYL) 4 MG tablet Take 0.5 tablets (2 mg total) by mouth daily.  [name] losartan (COZAAR) 100 MG tablet Take 1 tablet (100 mg total) by mouth daily.  . metFORMIN (GLUCOPHAGE) 500  MG tablet Take 2 tablets (1,000 mg total) by mouth 2 (two) times daily.  . mometasone (ELOCON) 0.1 % cream Apply 1 application topically daily.  . pioglitazone (ACTOS) 45 MG tablet TAKE 1 TABLET BY MOUTH ONCE DAILY  . tamsulosin (FLOMAX) 0.4 MG CAPS capsule Take 1 capsule (0.4 mg total) by mouth daily.  . vitamin B-12 (CYANOCOBALAMIN) 100 MCG tablet Take 100 mcg by mouth daily.   No facility-administered encounter medications on file as of 01/01/2017.     Activities of Daily Living In your present state of health, do you have any difficulty performing the following activities: 01/01/2017  Hearing? N  Vision? N  Difficulty concentrating or making decisions? N  Walking or climbing stairs? N  Dressing or bathing? N  Doing errands, shopping? N  Preparing Food and eating ? N  Using the Toilet? N  In the past six months, have you accidently leaked urine? N  Do you have problems with loss of bowel control? N  Managing your Medications? N  Managing your Finances? N  Housekeeping or managing your Housekeeping? N  Some recent data might be hidden    Patient Care Team: [name], MD as PCP - General (Family Medicine) [name], MD as Consulting Physician (Urology)   Assessment:  Exercise Activities and Dietary recommendations Current Exercise Habits: The patient has a physically strenous job, but has no regular exercise apart from work., Exercise limited by: None identified  Goals    . Increase water intake          Recommend drinking at least 4-5 glasses of water a day      Fall Risk Fall Risk  01/01/2017 09/26/2016 06/25/2016 12/27/2015 11/21/2014  Falls in the past year? No No No Yes No  Number falls in past yr: - - - 1 -  Injury with Fall? - - - No -   Depression Screen PHQ 2/9 Scores 01/01/2017 12/27/2015 11/21/2014  PHQ - 2 Score 0 0 0    Cognitive Function     6CIT Screen 01/01/2017  What Year? 0 points  What month? 0 points  What time? 0 points  Count  back from 20 0 points  Months in reverse 0 points  Repeat phrase 0 points  Total Score 0    Immunization History  Administered Date(s) Administered  . Influenza, High Dose Seasonal PF 03/25/2016  . Influenza,inj,Quad PF,6+ Mos 03/13/2015  . Influenza-Unspecified 04/18/2014  . Pneumococcal Conjugate-13 10/19/2013  . Pneumococcal Polysaccharide-23 09/03/1997, 02/26/2005  . Td 02/17/2008   Screening Tests Health Maintenance  Topic Date Due  . INFLUENZA VACCINE  12/04/2016  . FOOT EXAM  01/13/2017 (Originally 06/26/2016)  . HEMOGLOBIN A1C  03/29/2017  . OPHTHALMOLOGY EXAM  05/03/2017  . TETANUS/TDAP  02/16/2018  . COLONOSCOPY  04/20/2018      Plan:    I have personally reviewed and addressed the Medicare Annual Wellness questionnaire and have noted the following in the patient's chart:  A. Medical and social history B. Use of alcohol, tobacco or illicit drugs  C. Current medications and supplements D. Functional ability and status E.  Nutritional status F.  Physical activity G. Advance directives H. List of other physicians I.  Hospitalizations, surgeries, and ER visits in previous 12 months J.  [place] such as hearing and vision if needed, cognitive and depression L. Referrals and appointments   In addition, I have reviewed and discussed with patient certain preventive protocols, quality metrics, and best practice recommendations. A written personalized care plan for preventive services as well as general preventive health recommendations were provided to patient.   Signed,  Tyler Aas, LPN Nurse Health Advisor   MD Recommendations: due for diabetic foot exam at next visit.

## 2017-01-13 ENCOUNTER — Encounter: Payer: Self-pay | Admitting: Family Medicine

## 2017-01-13 ENCOUNTER — Ambulatory Visit (INDEPENDENT_AMBULATORY_CARE_PROVIDER_SITE_OTHER): Payer: Medicare Other | Admitting: Family Medicine

## 2017-01-13 VITALS — BP 138/74 | HR 60 | Wt 212.0 lb

## 2017-01-13 DIAGNOSIS — N401 Enlarged prostate with lower urinary tract symptoms: Secondary | ICD-10-CM

## 2017-01-13 DIAGNOSIS — Z0000 Encounter for general adult medical examination without abnormal findings: Secondary | ICD-10-CM | POA: Diagnosis not present

## 2017-01-13 DIAGNOSIS — Z7189 Other specified counseling: Secondary | ICD-10-CM | POA: Diagnosis not present

## 2017-01-13 DIAGNOSIS — R972 Elevated prostate specific antigen [PSA]: Secondary | ICD-10-CM | POA: Diagnosis not present

## 2017-01-13 DIAGNOSIS — E0821 Diabetes mellitus due to underlying condition with diabetic nephropathy: Secondary | ICD-10-CM | POA: Diagnosis not present

## 2017-01-13 DIAGNOSIS — C641 Malignant neoplasm of right kidney, except renal pelvis: Secondary | ICD-10-CM | POA: Diagnosis not present

## 2017-01-13 DIAGNOSIS — M1990 Unspecified osteoarthritis, unspecified site: Secondary | ICD-10-CM | POA: Diagnosis not present

## 2017-01-13 DIAGNOSIS — E785 Hyperlipidemia, unspecified: Secondary | ICD-10-CM | POA: Diagnosis not present

## 2017-01-13 DIAGNOSIS — Z23 Encounter for immunization: Secondary | ICD-10-CM

## 2017-01-13 DIAGNOSIS — N138 Other obstructive and reflux uropathy: Secondary | ICD-10-CM | POA: Diagnosis not present

## 2017-01-13 DIAGNOSIS — D692 Other nonthrombocytopenic purpura: Secondary | ICD-10-CM

## 2017-01-13 DIAGNOSIS — I10 Essential (primary) hypertension: Secondary | ICD-10-CM

## 2017-01-13 LAB — URINALYSIS, ROUTINE W REFLEX MICROSCOPIC
Bilirubin, UA: NEGATIVE
Glucose, UA: NEGATIVE
Ketones, UA: NEGATIVE
Leukocytes, UA: NEGATIVE
Nitrite, UA: NEGATIVE
Protein, UA: NEGATIVE
RBC, UA: NEGATIVE
Specific Gravity, UA: 1.02 (ref 1.005–1.030)
UUROB: 0.2 mg/dL (ref 0.2–1.0)
pH, UA: 6 (ref 5.0–7.5)

## 2017-01-13 LAB — BAYER DCA HB A1C WAIVED: HB A1C (BAYER DCA - WAIVED): 5.9 % (ref <7.0)

## 2017-01-13 MED ORDER — MOMETASONE FUROATE 0.1 % EX CREA: 1.0000 "application " | TOPICAL_CREAM | Freq: Every day | CUTANEOUS | 0 refills | Status: DC

## 2017-01-13 MED ORDER — ATORVASTATIN CALCIUM 20 MG PO TABS: 20.0000 mg | ORAL_TABLET | Freq: Every day | ORAL | 4 refills | Status: DC

## 2017-01-13 MED ORDER — METFORMIN HCL 500 MG PO TABS: 1000.0000 mg | ORAL_TABLET | Freq: Two times a day (BID) | ORAL | 4 refills | Status: DC

## 2017-01-13 MED ORDER — PIOGLITAZONE HCL 45 MG PO TABS: 45.0000 mg | ORAL_TABLET | Freq: Every day | ORAL | 4 refills | Status: DC

## 2017-01-13 MED ORDER — LIDOCAINE 5 % EX PTCH: 1.0000 | MEDICATED_PATCH | CUTANEOUS | 12 refills | Status: DC

## 2017-01-13 MED ORDER — LOSARTAN POTASSIUM 100 MG PO TABS: 100.0000 mg | ORAL_TABLET | Freq: Every day | ORAL | 4 refills | Status: DC

## 2017-01-13 MED ORDER — GLIMEPIRIDE 4 MG PO TABS: 2.0000 mg | ORAL_TABLET | Freq: Every day | ORAL | 4 refills | Status: DC

## 2017-01-13 MED ORDER — GABAPENTIN 300 MG PO CAPS: 300.0000 mg | ORAL_CAPSULE | Freq: Four times a day (QID) | ORAL | 4 refills | Status: DC

## 2017-01-13 MED ORDER — CARVEDILOL 12.5 MG PO TABS: 12.5000 mg | ORAL_TABLET | Freq: Two times a day (BID) | ORAL | 4 refills | Status: DC

## 2017-01-13 NOTE — Assessment & Plan Note (Signed)
The current medical regimen is effective;  continue present plan and medications.  

## 2017-01-13 NOTE — Assessment & Plan Note (Signed)
Stable followed by urology 

## 2017-01-13 NOTE — Patient Instructions (Signed)
Influenza (Flu) Vaccine (Live, Intranasal): What You Need to Know 1. Why get vaccinated? Influenza ("flu") is a contagious disease that spreads around the [place] every year, usually between October and May. Flu is caused by influenza viruses, and is spread mainly by coughing, sneezing, and close contact. Anyone can get flu. Flu strikes suddenly and can last several days. Symptoms vary by age, but can include:  fever/chills  sore throat  muscle aches  fatigue  cough  headache  runny or stuffy nose  Flu can also lead to pneumonia and blood infections, and cause diarrhea and seizures in children. If you have a medical condition, such as heart or lung disease, flu can make it worse. Flu is more dangerous for some people. Infants and young children, people 65 years of age and older, pregnant women, and people with certain health conditions or a weakened immune system are at greatest risk. Each year thousands of people in the [place] die from flu, and many more are hospitalized. Flu vaccine can:  keep you from getting flu,  make flu less severe if you do get it, and  keep you from spreading flu to your family and other people.  2. Live, attenuated flu vaccine-LAIV, Nasal Spray A dose of flu vaccine is recommended every flu season. Children younger than 9 years of age may need two doses during the same flu season. Everyone else needs only one dose each flu season. The live, attenuated influenza vaccine (called LAIV) may be given to healthy, non-pregnant people 2 through 74 years of age. It may safely be given at the same time as other vaccines. LAIV is sprayed into the nose. LAIV does not contain thimerosal or other preservatives. It is made from weakened flu virus and does not cause flu. There are many flu viruses, and they are always changing. Each year LAIV is made to protect against four viruses that are likely to cause disease in the upcoming flu season. But even when  the vaccine doesn't exactly match these viruses, it may still provide some protection. Flu vaccine cannot prevent:  flu that is caused by a virus not covered by the vaccine, or  illnesses that look like flu but are not.  It takes about 2 weeks for protection to develop after vaccination, and protection lasts through the flu season. 3. Some people should not get this vaccine Some people should not get LAIV because of age, health conditions, or other reasons. Most of these people should get an injected flu vaccine instead. Your healthcare provider can help you decide. Tell the provider if you or the person being vaccinated:  have any allergies, including an allergy to eggs, or have ever had an allergic reaction to an influenza vaccine.  have ever had Guillain-Barr Syndrome (also called GBS).  have any long-term heart, breathing, kidney, liver, or nervous system problems.  have asthma or breathing problems, or are a child who has had wheezing episodes.  are pregnant.  are a child or adolescent who is receiving aspirin or aspirin-containing products.  have a weakened immune system.  will be visiting or taking care of someone, within the next 7 days, who requires a protected environment (for example, following a bone marrow transplant)  Sometimes LAIV should be delayed. Tell the provider if you or the person being vaccinated:  are not feeling well. The vaccine could be delayed until you feel better.  have gotten any other vaccines in the past 4 weeks. Live vaccines given too close   together might not work as well.  have taken influenza antiviral medication in the past 48 hours.  have a very stuffy nose.  4. Risks of a vaccine reaction With any medicine, including vaccines, there is a chance of reactions. These are usually mild and go away on their own, but serious reactions are also possible. Most people who get LAIV do not have any problems with it. Reactions to LAIV may resemble  a very mild case of flu. Problems that have been reported following LAIV:  Children and adolescents 2-17 years of age: ? runny nose/nasal congestion ? cough ? fever ? headache and muscle aches ? wheezing ? abdominal pain, vomiting, or diarrhea  Adults 18-49 years of age: ? runny nose/nasal congestion ? sore throat ? cough ? chills ? tiredness/weakness ? headache  Problems that could happen after any vaccine:  Any medication can cause a severe allergic reaction. Such reactions from a vaccine are very rare, estimated at about 1 in a million doses, and would happen within a few minutes to a few hours after the vaccination. As with any medicine, there is a very small chance of a vaccine causing a serious injury or death. The safety of vaccines is always being monitored. For more information, visit: www.cdc.gov/vaccinesafety/ 5. What if there is a serious reaction? What should I look for? Look for anything that concerns you, such as signs of a severe allergic reaction, very high fever, or unusual behavior. Signs of a severe allergic reaction can include hives, swelling of the face and throat, difficulty breathing, a fast heartbeat, dizziness, and weakness. These would start a few minutes to a few hours after the vaccination. What should I do?  If you think it is a severe allergic reaction or other emergency that can't wait, call 9-1-1 and get the person to the nearest hospital. Otherwise, call your doctor.  Reactions should be reported to the Vaccine Adverse Event Reporting System (VAERS). Your doctor should file this report, or you can do it yourself through the VAERS web site at www.vaers.hhs.gov, or by calling 1-800-822-7967. ? VAERS does not give medical advice. 6. The National Vaccine Injury Compensation Program The National Vaccine Injury Compensation Program (VICP) is a federal program that was created to compensate people who may have been injured by certain vaccines. Persons  who believe they may have been injured by a vaccine can learn about the program and about filing a claim by calling 1-800-338-2382 or visiting the VICP website at www.hrsa.gov/vaccinecompensation. There is a time limit to file a claim for compensation. 7. How can I learn more?  Ask your healthcare provider. He or she can give you the vaccine package insert or suggest other sources of information.  Call your local or state health department.  Contact the Centers for Disease Control and Prevention (CDC): ? Call 1-800-232-4636 (1-800-CDC-INFO) or ? Visit CDC's website at www.cdc.gov/flu Vaccine Information Statement, Live Attenuated Influenza Vaccine (12/10/2013) This information is not intended to replace advice given to you by your health care provider. Make sure you discuss any questions you have with your health care provider. Document Released: 05/25/2010 Document Revised: 01/11/2016 Document Reviewed: 01/11/2016 Elsevier Interactive Patient Education  2017 [employer] Inc.  

## 2017-01-13 NOTE — Assessment & Plan Note (Signed)
A voluntary discussion about advance care planning including the explanation and discussion of advance directives was extensively discussed  with the patient.  Explanation about the health care proxy and Living will was reviewed and packet with forms with explanation of how to fill them out was given.    

## 2017-01-13 NOTE — Progress Notes (Signed)
BP 138/74   Pulse 60   Wt 212 lb (96.2 kg)   SpO2 98%   BMI 32.23 kg/m    Subjective:    Patient ID: Jason Contreras, male    DOB: 04-13-1941, 76 y.o.   MRN: [id number]  HPI: Jason Contreras is a 76 y.o. male  Chief Complaint  Patient presents with  . Annual Exam  . Back Pain  Patient doing well with medications diabetes hypertension hypercholesterol with good control of diabetes noted low blood sugar spells. Gabapentin helps with neuropathy. BPH symptoms stable Patient with some chronic back pain intermittently has used lidocaine patches in the past once to use those again as they have been effective Fatigue is gotten better with decreasing carvedilol from 12.5 twice a day to 12.5 once a day. Will try further reducing carvedilol from 12.5 once a day to 6.25 once a day and observe blood pressure. Maybe helped to stop medication completely. Relevant past medical, surgical, family and social history reviewed and updated as indicated. Interim medical history since our last visit reviewed. Allergies and medications reviewed and updated.  Review of Systems  Constitutional: Negative.   HENT: Negative.   Eyes: Negative.   Respiratory: Negative.   Cardiovascular: Negative.   Gastrointestinal: Negative.   Endocrine: Negative.   Genitourinary: Negative.   Musculoskeletal: Negative.   Skin: Negative.   Allergic/Immunologic: Negative.   Neurological: Negative.   Hematological: Negative.   Psychiatric/Behavioral: Negative.     Per HPI unless specifically indicated above     Objective:    BP 138/74   Pulse 60   Wt 212 lb (96.2 kg)   SpO2 98%   BMI 32.23 kg/m   Wt Readings from Last 3 Encounters:  01/13/17 212 lb (96.2 kg)  01/01/17 227 lb 14.4 oz (103.4 kg)  09/26/16 223 lb (101.2 kg)    Physical Exam  Constitutional: He is oriented to person, place, and time. He appears well-developed and well-nourished.  HENT:  Head: Normocephalic and atraumatic.  Right Ear: External  ear normal.  Left Ear: External ear normal.  Eyes: Pupils are equal, round, and reactive to light. Conjunctivae and EOM are normal.  Neck: Normal range of motion. Neck supple.  Cardiovascular: Normal rate, regular rhythm, normal heart sounds and intact distal pulses.   Pulmonary/Chest: Effort normal and breath sounds normal.  Abdominal: Soft. Bowel sounds are normal. There is no splenomegaly or hepatomegaly.  Genitourinary:  Genitourinary Comments: Done at urology  Musculoskeletal: Normal range of motion.  Neurological: He is alert and oriented to person, place, and time. He has normal reflexes.  Skin: No rash noted. No erythema.  Psychiatric: He has a normal mood and affect. His behavior is normal. Judgment and thought content normal.    Results for orders placed or performed in visit on 09/26/16  LP+ALT+AST Piccolo, [employer]  Result Value Ref Range   ALT (SGPT) Piccolo, Waived 18 10 - 47 U/L   AST (SGOT) Piccolo, Waived 19 11 - 38 U/L   Cholesterol Piccolo, Waived 110 <200 mg/dL   HDL Chol Piccolo, Waived 41 (L) >59 mg/dL   Triglycerides Piccolo,Waived 112 <150 mg/dL   Chol/HDL Ratio Piccolo,Waive 2.7 mg/dL   LDL Chol Calc Piccolo Waived 47 <100 mg/dL   VLDL Chol Calc Piccolo,Waive 22 <30 mg/dL  Bayer DCA Hb A1c Waived  Result Value Ref Range   Bayer DCA Hb A1c Waived 5.9 <2.9 %  Basic metabolic panel  Result Value Ref Range   Glucose 123 (  H) 65 - 99 mg/dL   BUN 17 8 - 27 mg/dL   Creatinine, Ser 0.81 0.76 - 1.27 mg/dL   GFR calc non Af Amer 88 >59 mL/min/1.73   GFR calc Af Amer 102 >59 mL/min/1.73   BUN/Creatinine Ratio 21 10 - 24   Sodium 141 134 - 144 mmol/L   Potassium 4.8 3.5 - 5.2 mmol/L   Chloride 102 96 - 106 mmol/L   CO2 25 18 - 29 mmol/L   Calcium 9.3 8.6 - 10.2 mg/dL  CBC With Differential/Platelet  Result Value Ref Range   WBC 4.5 3.4 - 10.8 x10E3/uL   RBC 4.41 4.14 - 5.80 x10E6/uL   Hemoglobin 14.3 13.0 - 17.7 g/dL   Hematocrit 42.4 37.5 - 51.0 %   MCV 96  79 - 97 fL   MCH 32.4 26.6 - 33.0 pg   MCHC 33.7 31.5 - 35.7 g/dL   RDW 14.0 12.3 - 15.4 %   Platelets 209 150 - 379 x10E3/uL   Neutrophils 63 Not Estab. %   Lymphs 28 Not Estab. %   MID 9 Not Estab. %   Neutrophils Absolute 2.8 1.4 - 7.0 x10E3/uL   Lymphocytes Absolute 1.3 0.7 - 3.1 x10E3/uL   MID (Absolute) 0.4 0.1 - 1.6 X10E3/uL      Assessment & Plan:   Problem List Items Addressed This Visit      Cardiovascular and Mediastinum   Hypertension - Primary   Relevant Medications   atorvastatin (LIPITOR) 20 MG tablet   carvedilol (COREG) 12.5 MG tablet   losartan (COZAAR) 100 MG tablet   Other Relevant Orders   CBC with Differential/Platelet   Senile purpura (HCC)    stable      Relevant Medications   atorvastatin (LIPITOR) 20 MG tablet   carvedilol (COREG) 12.5 MG tablet   losartan (COZAAR) 100 MG tablet     Endocrine   DM due to underlying condition with diabetic nephropathy (HCC)    The current medical regimen is effective;  continue present plan and medications.       Relevant Medications   atorvastatin (LIPITOR) 20 MG tablet   glimepiride (AMARYL) 4 MG tablet   losartan (COZAAR) 100 MG tablet   metFORMIN (GLUCOPHAGE) 500 MG tablet   pioglitazone (ACTOS) 45 MG tablet   Other Relevant Orders   Comprehensive metabolic panel   Urinalysis, Routine w reflex microscopic   Bayer DCA Hb A1c Waived     Musculoskeletal and Integument   Arthritis    Back pain from arthritis light in terms helped in the past will give prescription for same        Genitourinary   BPH with obstruction/lower urinary tract symptoms    The current medical regimen is effective;  continue present plan and medications.       Relevant Orders   TSH   Adenocarcinoma, renal cell ([place])    Stable followed by urology        Other   Hyperlipidemia    The current medical regimen is effective;  continue present plan and medications.       Relevant Medications   atorvastatin (LIPITOR)  20 MG tablet   carvedilol (COREG) 12.5 MG tablet   losartan (COZAAR) 100 MG tablet   Other Relevant Orders   Lipid panel   Elevated PSA   Relevant Orders   PSA   TSH   Advanced care planning/counseling discussion    A voluntary discussion about advance care planning including the explanation  and discussion of advance directives was extensively discussed  with the patient.  Explanation about the health care proxy and Living will was reviewed and packet with forms with explanation of how to fill them out was given.        PE (physical exam), annual    Other Visit Diagnoses    Needs flu shot       Relevant Orders   Flu vaccine HIGH DOSE PF (Completed)       Follow up plan: Return for Hemoglobin A1c.

## 2017-01-13 NOTE — Assessment & Plan Note (Signed)
stable °

## 2017-01-13 NOTE — Assessment & Plan Note (Signed)
Back pain from arthritis light in terms helped in the past will give prescription for same

## 2017-01-14 ENCOUNTER — Encounter: Payer: Self-pay | Admitting: Family Medicine

## 2017-01-14 LAB — COMPREHENSIVE METABOLIC PANEL
A/G RATIO: 1.7 (ref 1.2–2.2)
ALBUMIN: 4.1 g/dL (ref 3.5–4.8)
ALT: 11 IU/L (ref 0–44)
AST: 16 IU/L (ref 0–40)
Alkaline Phosphatase: 55 IU/L (ref 39–117)
BILIRUBIN TOTAL: 0.6 mg/dL (ref 0.0–1.2)
BUN / CREAT RATIO: 15 (ref 10–24)
BUN: 17 mg/dL (ref 8–27)
CALCIUM: 9.5 mg/dL (ref 8.6–10.2)
CO2: 27 mmol/L (ref 20–29)
Chloride: 103 mmol/L (ref 96–106)
Creatinine, Ser: 1.16 mg/dL (ref 0.76–1.27)
GFR, EST AFRICAN AMERICAN: 72 mL/min/{1.73_m2} (ref >59)
GFR, EST NON AFRICAN AMERICAN: 62 mL/min/{1.73_m2} (ref >59)
Globulin, Total: 2.4 g/dL (ref 1.5–4.5)
Glucose: 82 mg/dL (ref 65–99)
POTASSIUM: 5.2 mmol/L (ref 3.5–5.2)
Sodium: 141 mmol/L (ref 134–144)
TOTAL PROTEIN: 6.5 g/dL (ref 6.0–8.5)

## 2017-01-14 LAB — CBC WITH DIFFERENTIAL/PLATELET
Basophils Absolute: 0 10*3/uL (ref 0.0–0.2)
Basos: 0 %
EOS (ABSOLUTE): 0.1 10*3/uL (ref 0.0–0.4)
Eos: 2 %
Hematocrit: 41.3 % (ref 37.5–51.0)
Hemoglobin: 13.7 g/dL (ref 13.0–17.7)
Immature Grans (Abs): 0 10*3/uL (ref 0.0–0.1)
Immature Granulocytes: 0 %
Lymphocytes Absolute: 1.6 10*3/uL (ref 0.7–3.1)
Lymphs: 29 %
MCH: 31.6 pg (ref 26.6–33.0)
MCHC: 33.2 g/dL (ref 31.5–35.7)
MCV: 95 fL (ref 79–97)
Monocytes Absolute: 0.5 10*3/uL (ref 0.1–0.9)
Monocytes: 9 %
Neutrophils Absolute: 3.3 10*3/uL (ref 1.4–7.0)
Neutrophils: 60 %
Platelets: 248 10*3/uL (ref 150–379)
RBC: 4.34 x10E6/uL (ref 4.14–5.80)
RDW: 13.8 % (ref 12.3–15.4)
WBC: 5.5 10*3/uL (ref 3.4–10.8)

## 2017-01-14 LAB — LIPID PANEL
CHOL/HDL RATIO: 2.7 ratio (ref 0.0–5.0)
Cholesterol, Total: 108 mg/dL (ref 100–199)
HDL: 40 mg/dL (ref >39)
LDL Calculated: 45 mg/dL (ref 0–99)
Triglycerides: 117 mg/dL (ref 0–149)
VLDL CHOLESTEROL CAL: 23 mg/dL (ref 5–40)

## 2017-01-14 LAB — PSA: PROSTATE SPECIFIC AG, SERUM: 4.8 ng/mL — AB (ref 0.0–4.0)

## 2017-01-14 LAB — TSH: TSH: 3.89 u[IU]/mL (ref 0.450–4.500)

## 2017-04-22 ENCOUNTER — Ambulatory Visit (INDEPENDENT_AMBULATORY_CARE_PROVIDER_SITE_OTHER): Payer: Medicare Other | Admitting: Family Medicine

## 2017-04-22 ENCOUNTER — Encounter: Payer: Self-pay | Admitting: Family Medicine

## 2017-04-22 VITALS — BP 117/70 | HR 64 | Temp 98.2°F | Wt 230.0 lb

## 2017-04-22 DIAGNOSIS — E785 Hyperlipidemia, unspecified: Secondary | ICD-10-CM

## 2017-04-22 DIAGNOSIS — E0821 Diabetes mellitus due to underlying condition with diabetic nephropathy: Secondary | ICD-10-CM | POA: Diagnosis not present

## 2017-04-22 DIAGNOSIS — I10 Essential (primary) hypertension: Secondary | ICD-10-CM

## 2017-04-22 LAB — BAYER DCA HB A1C WAIVED: HB A1C (BAYER DCA - WAIVED): 6 % (ref <7.0)

## 2017-04-22 MED ORDER — CARVEDILOL 6.25 MG PO TABS: 6.2500 mg | ORAL_TABLET | Freq: Two times a day (BID) | ORAL | 3 refills | Status: DC

## 2017-04-22 MED ORDER — TAMSULOSIN HCL 0.4 MG PO CAPS: 0.4000 mg | ORAL_CAPSULE | Freq: Every day | ORAL | 2 refills | Status: DC

## 2017-04-22 NOTE — Progress Notes (Signed)
   BP 117/70 (BP Location: Right Arm, Patient Position: Sitting, Cuff Size: Normal)   Pulse 64   Temp 98.2 F (36.8 C) (Temporal)   Wt 230 lb (104.3 kg)   SpO2 99%   BMI 34.97 kg/m    Subjective:    Patient ID: Jason Contreras, male    DOB: 1941-02-11, 76 y.o.   MRN: [id number]  HPI: Jason Contreras is a 76 y.o. male  Chief Complaint  Patient presents with  . Diabetes  patient follow-up diabetes doing better. No complaints low blood sugar spells issues.Blood pressure also doing well with no complaints. Cholesterol also doing well without any issues or concerns. Patient with blood pressure had decreased carvedilol from 12.5 mg to 6.25  By cutting pills in half to see if that helped with fatigue. Blood pressures remained good fatigue a been a difference.  Will continue lower dose carvedilol.  Relevant past medical, surgical, family and social history reviewed and updated as indicated. Interim medical history since our last visit reviewed. Allergies and medications reviewed and updated.  Review of Systems  Constitutional: Negative.   Respiratory: Negative.   Cardiovascular: Negative.     Per HPI unless specifically indicated above     Objective:    BP 117/70 (BP Location: Right Arm, Patient Position: Sitting, Cuff Size: Normal)   Pulse 64   Temp 98.2 F (36.8 C) (Temporal)   Wt 230 lb (104.3 kg)   SpO2 99%   BMI 34.97 kg/m   Wt Readings from Last 3 Encounters:  04/22/17 230 lb (104.3 kg)  01/13/17 212 lb (96.2 kg)  01/01/17 227 lb 14.4 oz (103.4 kg)    Physical Exam  Constitutional: He is oriented to person, place, and time. He appears well-developed and well-nourished.  HENT:  Head: Normocephalic and atraumatic.  Eyes: Conjunctivae and EOM are normal.  Neck: Normal range of motion.  Cardiovascular: Normal rate, regular rhythm and normal heart sounds.  Pulmonary/Chest: Effort normal and breath sounds normal.  Musculoskeletal: Normal range of motion.  Neurological:  He is alert and oriented to person, place, and time.  Skin: No erythema.  Psychiatric: He has a normal mood and affect. His behavior is normal. Judgment and thought content normal.        Assessment & Plan:   Problem List Items Addressed This Visit      Cardiovascular and Mediastinum   Hypertension    The current medical regimen is effective;  continue present plan and medications.       Relevant Medications   carvedilol (COREG) 6.25 MG tablet     Endocrine   DM due to underlying condition with diabetic nephropathy ([place]) - Primary    The current medical regimen is effective;  continue present plan and medications.       Relevant Orders   Bayer DCA Hb A1c Waived     Other   Hyperlipidemia    The current medical regimen is effective;  continue present plan and medications.       Relevant Medications   carvedilol (COREG) 6.25 MG tablet       Follow up plan: Return in about 3 months (around 07/21/2017) for Hemoglobin A1c, BMP,  Lipids, ALT, AST.

## 2017-04-22 NOTE — Assessment & Plan Note (Signed)
The current medical regimen is effective;  continue present plan and medications.  

## 2017-06-19 ENCOUNTER — Ambulatory Visit
Admission: RE | Admit: 2017-06-19 | Discharge: 2017-06-19 | Disposition: A | Discharge status: Home / Self Care | Payer: Medicare Other | Source: Ambulatory Visit | Attending: Urology | Admitting: Urology

## 2017-06-19 ENCOUNTER — Inpatient Hospital Stay: Admit: 2017-06-19 | Payer: Self-pay

## 2017-06-19 DIAGNOSIS — N400 Enlarged prostate without lower urinary tract symptoms: Secondary | ICD-10-CM | POA: Insufficient documentation

## 2017-06-19 DIAGNOSIS — I2510 Atherosclerotic heart disease of native coronary artery without angina pectoris: Secondary | ICD-10-CM | POA: Diagnosis not present

## 2017-06-19 DIAGNOSIS — N2889 Other specified disorders of kidney and ureter: Secondary | ICD-10-CM | POA: Insufficient documentation

## 2017-06-19 DIAGNOSIS — I700 Atherosclerosis of aorta: Secondary | ICD-10-CM | POA: Diagnosis not present

## 2017-06-19 DIAGNOSIS — R972 Elevated prostate specific antigen [PSA]: Secondary | ICD-10-CM

## 2017-06-19 DIAGNOSIS — Z85528 Personal history of other malignant neoplasm of kidney: Secondary | ICD-10-CM | POA: Insufficient documentation

## 2017-06-19 DIAGNOSIS — Z905 Acquired absence of kidney: Secondary | ICD-10-CM | POA: Insufficient documentation

## 2017-06-19 DIAGNOSIS — K5730 Diverticulosis of large intestine without perforation or abscess without bleeding: Secondary | ICD-10-CM | POA: Insufficient documentation

## 2017-06-19 DIAGNOSIS — C649 Malignant neoplasm of unspecified kidney, except renal pelvis: Secondary | ICD-10-CM | POA: Diagnosis not present

## 2017-06-19 DIAGNOSIS — C641 Malignant neoplasm of right kidney, except renal pelvis: Secondary | ICD-10-CM | POA: Diagnosis not present

## 2017-06-19 HISTORY — DX: Disorder of kidney and ureter, unspecified: N28.9

## 2017-06-19 LAB — POCT I-STAT CREATININE: Creatinine, Ser: 1 mg/dL (ref 0.61–1.24)

## 2017-06-19 MED ORDER — IOPAMIDOL (ISOVUE-300) INJECTION 61%
100.0000 mL | Freq: Once | INTRAVENOUS | Status: AC | PRN
  Administered 2017-06-19: 100 mL via INTRAVENOUS

## 2017-07-10 ENCOUNTER — Encounter: Payer: Self-pay | Admitting: Urology

## 2017-07-10 ENCOUNTER — Ambulatory Visit: Payer: Medicare Other | Admitting: Urology

## 2017-07-10 VITALS — BP 138/84 | HR 116 | Resp 16 | Ht 69.5 in | Wt 227.2 lb

## 2017-07-10 DIAGNOSIS — N138 Other obstructive and reflux uropathy: Secondary | ICD-10-CM | POA: Diagnosis not present

## 2017-07-10 DIAGNOSIS — N2889 Other specified disorders of kidney and ureter: Secondary | ICD-10-CM | POA: Diagnosis not present

## 2017-07-10 DIAGNOSIS — N401 Enlarged prostate with lower urinary tract symptoms: Secondary | ICD-10-CM | POA: Diagnosis not present

## 2017-07-10 DIAGNOSIS — R972 Elevated prostate specific antigen [PSA]: Secondary | ICD-10-CM

## 2017-07-10 NOTE — Progress Notes (Signed)
07/10/2017   Jason Contreras Jan 17, 1941 [id number]  Referring provider: [name], MD [address] [place], [place] [address]  Chief Complaint  Patient presents with  . Other    renal mass    HPI: Jason Contreras is a 77 year old male with a history of elevated PSA s/p recent negative prostate biopsy, a right renal mass s/p right partial nephrectomy 12/2015 who returns today for routine annual follow up.   Elevated PSA Patient was found to have a PSA of 4.5 ng/mL on 10/19/2013. A 4K score conducted on 12/15/2013 noted a 22% probability of having a Gleason's 7 or higher prostate cancer. PSA was 3.9 ng/mL on 12/29/2014, 5.8 on 06/26/15 and 6.0 on 06/30/15.  Repeat PSA on 09/29/15 5.6. He is found to have an enlarged prostate on DRE, but no nodules have been palpated.    He has a family history of PCa, with father having been diagnosed with PCa with metastatic diease in his 67s.    S/p negative biopsy on 10/09/15 which had 2 foci of HG PIN, otherwise no malignancy.  TRUS volume 41 cc.    Most recent PSA 4.8 on 01/13/2017.    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    Follow-up CT abdomen and pelvis as well as chest x-ray negative today at 6 month interval.  Most recent imaging on 06/19/2017 shows no local recurrence of the bed of his previous renal mass on the rise.  There is however, a new lesion measuring 2.0 x 1.7 cm which in retrospect it was in fact present from a year ago previously measuring 1.4 x 1.2.  It is quite occult in nature.  It was negative for any evidence of metastatic disease.  No gross hematuria.  He occasionally has some dull right flank pain but this is not bothersome to him.  He relates this to drinking too much water.  BPH WITH LUTS No significant rinary complaints today.  Doing well on Flomax.   PMH: Past Medical History:  Diagnosis Date    . Adenocarcinoma, renal cell ([place])   . Arthritis   . Diabetes mellitus without complication ([place])   . Elevated PSA   . Headache   . Hematuria   . Hyperlipidemia   . Hypertension   . Neuropathy   . Obesity   . Renal insufficiency   . Right renal mass     Surgical History: Past Surgical History:  Procedure Laterality Date  . APPENDECTOMY    . IR RADIOLOGIST EVAL & MGMT  07/17/2017  . ROBOTIC ASSITED PARTIAL NEPHRECTOMY Right 12/12/2015   Procedure: ROBOTIC ASSITED PARTIAL NEPHRECTOMY;  Surgeon: Jason Espy, MD;  Location: [facility] ORS;  Service: Urology;  Laterality: Right;    Home Medications:  Allergies as of 07/10/2017      Reactions   Morphine And Related Nausea And Vomiting      Medication List        Accurate as of 07/10/17 11:59 PM. Always use your most recent med list.          aspirin-acetaminophen-caffeine 250-250-65 MG tablet Commonly known as:  EXCEDRIN MIGRAINE Take 1 tablet by mouth every 6 (six) hours as needed for headache.   atorvastatin 20 MG tablet Commonly known as:  LIPITOR Take 1 tablet (20 mg total) by mouth daily at 6 PM.   carvedilol 6.25 MG tablet Commonly known  as:  COREG Take 1 tablet (6.25 mg total) by mouth 2 (two) times daily.   gabapentin 300 MG capsule Commonly known as:  NEURONTIN Take 1 capsule (300 mg total) by mouth 4 (four) times daily.   glimepiride 4 MG tablet Commonly known as:  AMARYL Take 0.5 tablets (2 mg total) by mouth daily.   lidocaine 5 % Commonly known as:  LIDODERM Place 1 patch onto the skin daily. Remove & Discard patch within 12 hours or as directed by MD   losartan 100 MG tablet Commonly known as:  COZAAR Take 1 tablet (100 mg total) by mouth daily.   metFORMIN 500 MG tablet Commonly known as:  GLUCOPHAGE Take 2 tablets (1,000 mg total) by mouth 2 (two) times daily.   mometasone 0.1 % cream Commonly known as:  ELOCON Apply 1 application topically daily.   pioglitazone 45 MG tablet Commonly known  as:  ACTOS Take 1 tablet (45 mg total) by mouth daily.   tamsulosin 0.4 MG Caps capsule Commonly known as:  FLOMAX Take 1 capsule (0.4 mg total) by mouth daily.   vitamin B-12 100 MCG tablet Commonly known as:  CYANOCOBALAMIN Take 100 mcg by mouth daily.       Allergies:  Allergies  Allergen Reactions  . Morphine And Related Nausea And Vomiting    Family History: Family History  Problem Relation Age of Onset  . Cancer Mother        BONE  . Stroke Brother   . Kidney disease Neg Hx     Social History:  reports that  has never smoked. he has never used smokeless tobacco. He reports that he does not drink alcohol or use drugs.   Physical Exam: BP 138/84   Pulse (!) 116   Resp 16   Ht 5' 9.5" (1.765 m)   Wt 227 lb 3.2 oz (103.1 kg)   SpO2 98%   BMI 33.07 kg/m   NAD.  A&O x 3.  Focally intact, no neurological deficits. No increased work of breathing, respiratory distress.  No CVA tenderness. No LE edema.   Laboratory Data: Lab Results  Component Value Date   WBC CANCELED 01/13/2017   HGB CANCELED 01/13/2017   HCT CANCELED 01/13/2017   MCV 95 01/13/2017   PLT CANCELED 01/13/2017    Lab Results  Component Value Date   CREATININE 1.00 06/19/2017    PSA history:      4.5 ng/dL on 10/19/2013.      4K score of 22% on 12/15/2013      3.9 ng/mL on 06/28/2014      3.9 ng/mL on 12/29/2014      5.8 ng/Dl on 06/26/15       6.0 ng/dL on 06/30/15       5.6 ng/dL on 09/29/15      5.1 on 07/2016      4.8 on 01/2017      Pertinent imaging CLINICAL DATA:  Status post partial right nephrectomy for papillary renal cell carcinoma 12/12/2015. Patient presents for routine follow-up.  EXAM: CT ABDOMEN AND PELVIS WITHOUT AND WITH CONTRAST  TECHNIQUE: Multidetector CT imaging of the abdomen and pelvis was performed following the standard protocol before and following the bolus administration of intravenous contrast.  CONTRAST:  128mL ISOVUE-300 IOPAMIDOL  (ISOVUE-300) INJECTION 61%  COMPARISON:  07/11/2016 CT abdomen/pelvis.  FINDINGS: Lower chest: No significant pulmonary nodules or acute consolidative airspace disease. Three-vessel coronary atherosclerosis.  Hepatobiliary: Normal liver size. No liver mass. Normal gallbladder with  no radiopaque cholelithiasis. No biliary ductal dilatation.  Pancreas: Normal, with no mass or duct dilation.  Spleen: Normal size. No mass.  Adrenals/Urinary Tract: Normal adrenals. No renal stones. No hydronephrosis. Normal caliber ureters, with no ureteral stones. There is a new focal contour bulge in the posterior interpolar right kidney, which appears to correlate with a nearly occult enhancing 2.0 x 1.7 cm renal cortical mass (series 20/image 25), which appears to have increased in size from 1.4 x 1.2 cm on 07/11/2016 and 1.0 x 1.0 cm on 06/22/2015 CT studies. Minimal residual fat stranding at the partial nephrectomy site in the anterior interpolar right kidney, with no evidence of an enhancing mass to suggest local recurrence. Normal bladder.  Stomach/Bowel: Normal non-distended stomach. Normal caliber small bowel with no small bowel wall thickening. Normal appendix. Mild-to-moderate diffuse colonic diverticulosis, most prominent in the sigmoid colon, with no definite large bowel wall thickening or pericolonic fat stranding.  Vascular/Lymphatic: Atherosclerotic nonaneurysmal abdominal aorta. Patent portal, splenic, hepatic and renal veins. No pathologically enlarged lymph nodes in the abdomen or pelvis.  Reproductive: Mildly enlarged prostate.  Other: No pneumoperitoneum, ascites or focal fluid collection.  Musculoskeletal: No aggressive appearing focal osseous lesions. Marked thoracolumbar spondylosis.  IMPRESSION: 1. New focal contour bulge in the posterior interpolar right kidney, which appears to correlate with a nearly occult enhancing 2.0 cm renal cortical mass,  suspicious for renal cell carcinoma. MRI abdomen without and with IV contrast recommended for further characterization. 2. No evidence of local tumor recurrence at the partial nephrectomy site in the anterior interpolar right kidney. 3. No evidence of metastatic disease in the abdomen. 4. Chronic findings include: Aortic Atherosclerosis (ICD10-I70.0). Three-vessel coronary atherosclerosis. Colonic diverticulosis. Mild prostatomegaly.  These results will be called to the ordering clinician or representative by the Radiologist Assistant, and communication documented in the PACS or zVision Dashboard.   Electronically Signed   By: Ilona Sorrel M.D.   On: 06/19/2017 15:11  CLINICAL DATA:  Elevated PSA.  Follow-up of renal cancer.  EXAM: CHEST 1 VIEW  COMPARISON:  07/11/2016  FINDINGS: Cardiomediastinal silhouette is normal. Mediastinal contours appear intact. Calcific atherosclerotic disease and tortuosity of the aorta  There is no evidence of focal airspace consolidation, pleural effusion or pneumothorax.  Osseous structures are without acute abnormality. Soft tissues are grossly normal.  IMPRESSION: No active disease.  Calcific atherosclerotic disease and tortuosity of the aorta.  Please note that pulmonary nodules less than 2 cm could be easily overlooked radiographically.   Electronically Signed   By: Fidela Salisbury M.D.   On: 06/19/2017 16:29   CT scan and chest x-ray are personally reviewed today with the patient.  These were also diretly compared to CT from 07/2016.  Assessment & Plan:    1. Right renal mass:     pT1a RCC s/p right robotic partial nephrectomy on 12/12/15 Subtle enlarging 2 cm right posterior renal mass, near a cold appearance on previous scans We discussed options moving forward including radical nephrectomy, repeat partial nephrectomy versus ablative therapy Given that this lesion is enlarging with personal history of  papillary renal cell carcinoma, I would more strongly recommend intervention as opposed to surveillance He is most interested in cryotherapy-lesion appears to be amenable to this via posterior approach Will refer to radiology for consult Risk and benefits are briefly reviewed today and all questions answered  2. Elevated PSA:      History of elevated up to PSA 5.6 s/p negative biopsy 10/2015 (HgPIN) Most recent PSA  4.8 9 /2018 Will repeat in 1 year and then likely stop further PSA screnning -- > defer rectal exam to next visit in light of #1  3. BPH (benign prostatic hyperplasia) with LUTS:    Continue flomax  Return in about 6 months (around 01/10/2018) for recheck PSA, renal  (referal to [facility] radiology) .   Jason Espy, MD  [facility] [address] [place], [place] [address] [phone]  I spent 25 min with this patient of which greater than 50% was spent in counseling and coordination of care with the patient.

## 2017-07-11 ENCOUNTER — Ambulatory Visit: Payer: Medicare Other | Admitting: Urology

## 2017-07-11 ENCOUNTER — Other Ambulatory Visit: Payer: Self-pay | Admitting: Radiology

## 2017-07-11 DIAGNOSIS — N2889 Other specified disorders of kidney and ureter: Secondary | ICD-10-CM

## 2017-07-17 ENCOUNTER — Other Ambulatory Visit (HOSPITAL_COMMUNITY): Payer: Self-pay | Admitting: Interventional Radiology

## 2017-07-17 ENCOUNTER — Ambulatory Visit
Admission: RE | Admit: 2017-07-17 | Discharge: 2017-07-17 | Disposition: A | Discharge status: Home / Self Care | Payer: Medicare Other | Source: Ambulatory Visit | Attending: Urology | Admitting: Urology

## 2017-07-17 DIAGNOSIS — N2889 Other specified disorders of kidney and ureter: Secondary | ICD-10-CM

## 2017-07-17 HISTORY — DX: Other specified disorders of kidney and ureter: N28.89

## 2017-07-17 HISTORY — PX: IR RADIOLOGIST EVAL & MGMT: IMG5224

## 2017-07-17 NOTE — Consult Note (Signed)
Chief Complaint: New indeterminate right renal mass concerning for renal cell carcinoma. History of papillary carcinoma, status post previous partial nephrectomy.   Referring Physician(s): Brandon,Ashley  History of Present Illness: Jason Contreras is a 76 y.o. male with a prior history of elevated PSA but negative prostate biopsy. He also has a history of a small right renal mass status post right partial nephrectomy August 2017. This lesion was a papillary renal cell carcinoma. Surveillance imaging by CT demonstrates a new posterior medial right renal cortical lesion measuring up to 2 cm suspicious for a second developing renal cell carcinoma in the right kidney. He presents today to evaluate for cryoablation. No current symptoms including flank or abdominal pain. No dysuria or hematuria. No fevers. Stable weight and appetite. Overall he feels healthy without significant complaints.  Past Medical History:  Diagnosis Date  . Adenocarcinoma, renal cell ([place])   . Arthritis   . Diabetes mellitus without complication ([place])   . Elevated PSA   . Headache   . Hematuria   . Hyperlipidemia   . Hypertension   . Neuropathy   . Obesity   . Renal insufficiency   . Right renal mass     Past Surgical History:  Procedure Laterality Date  . APPENDECTOMY    . IR RADIOLOGIST EVAL & MGMT  07/17/2017  . ROBOTIC ASSITED PARTIAL NEPHRECTOMY Right 12/12/2015   Procedure: ROBOTIC ASSITED PARTIAL NEPHRECTOMY;  Surgeon: [name], MD;  Location: [facility] ORS;  Service: Urology;  Laterality: Right;    Allergies: Morphine and related  Medications: Prior to Admission medications   Medication Sig Start Date End Date Taking? Authorizing Provider  aspirin-acetaminophen-caffeine (EXCEDRIN MIGRAINE) 213 061 5087 MG tablet Take 1 tablet by mouth every 6 (six) hours as needed for headache.   Yes [provider]  atorvastatin (LIPITOR) 20 MG tablet Take 1 tablet (20 mg total) by mouth daily at 6  PM. 01/13/17  Yes Crissman, Jeannette How, MD  gabapentin (NEURONTIN) 300 MG capsule Take 1 capsule (300 mg total) by mouth 4 (four) times daily. 01/13/17  Yes Crissman, Jeannette How, MD  glimepiride (AMARYL) 4 MG tablet Take 0.5 tablets (2 mg total) by mouth daily. 01/13/17  Yes Crissman, Jeannette How, MD  lidocaine (LIDODERM) 5 % Place 1 patch onto the skin daily. Remove & Discard patch within 12 hours or as directed by MD 01/13/17  Yes Crissman, Jeannette How, MD  losartan (COZAAR) 100 MG tablet Take 1 tablet (100 mg total) by mouth daily. 01/13/17  Yes Crissman, Jeannette How, MD  metFORMIN (GLUCOPHAGE) 500 MG tablet Take 2 tablets (1,000 mg total) by mouth 2 (two) times daily. 01/13/17  Yes Crissman, Jeannette How, MD  mometasone (ELOCON) 0.1 % cream Apply 1 application topically daily. 01/13/17  Yes Crissman, Jeannette How, MD  pioglitazone (ACTOS) 45 MG tablet Take 1 tablet (45 mg total) by mouth daily. 01/13/17  Yes Crissman, Jeannette How, MD  tamsulosin (FLOMAX) 0.4 MG CAPS capsule Take 1 capsule (0.4 mg total) by mouth daily. 04/22/17  Yes Crissman, Jeannette How, MD  vitamin B-12 (CYANOCOBALAMIN) 100 MCG tablet Take 100 mcg by mouth daily.   Yes [provider]  carvedilol (COREG) 6.25 MG tablet Take 1 tablet (6.25 mg total) by mouth 2 (two) times daily. Patient not taking: Reported on 07/10/2017 04/22/17   [name], MD     Family History  Problem Relation Age of Onset  . Cancer Mother  BONE  . Stroke Brother   . Kidney disease Neg Hx     Social History   Socioeconomic History  . Marital status: Married    Spouse name: Not on file  . Number of children: Not on file  . Years of education: Not on file  . Highest education level: Not on file  Social Needs  . Financial resource strain: Not on file  . Food insecurity - worry: Not on file  . Food insecurity - inability: Not on file  . Transportation needs - medical: Not on file  . Transportation needs - non-medical: Not on file  Occupational History  . Not on file    Tobacco Use  . Smoking status: Never Smoker  . Smokeless tobacco: Never Used  Substance and Sexual Activity  . Alcohol use: No  . Drug use: No  . Sexual activity: Not on file  Other Topics Concern  . Not on file  Social History Narrative  . Not on file     Review of Systems: A 12 point ROS discussed and pertinent positives are indicated in the HPI above.  All other systems are negative.  Review of Systems  Vital Signs: BP (!) 148/62   Pulse 92   Temp 98.2 F (36.8 C) (Oral)   Resp 16   Ht 5' 9.5" (1.765 m)   Wt 227 lb (103 kg)   SpO2 96%   BMI 33.04 kg/m   Physical Exam  Constitutional: He is oriented to person, place, and time. He appears well-developed and well-nourished. No distress.  Eyes: Conjunctivae are normal.  Cardiovascular: Normal rate, regular rhythm and normal heart sounds.  Pulmonary/Chest: Effort normal and breath sounds normal.  Abdominal: Soft. Bowel sounds are normal. He exhibits no distension. There is no tenderness.  No flank pain or CVA tenderness.  Neurological: He is alert and oriented to person, place, and time.  Skin: No rash noted. He is not diaphoretic. No erythema.  Psychiatric: He has a normal mood and affect.     Imaging: Ct Abdomen Pelvis W Wo Contrast  Result Date: 06/19/2017 CLINICAL DATA:  Status post partial right nephrectomy for papillary renal cell carcinoma 12/12/2015. Patient presents for routine follow-up. EXAM: CT ABDOMEN AND PELVIS WITHOUT AND WITH CONTRAST TECHNIQUE: Multidetector CT imaging of the abdomen and pelvis was performed following the standard protocol before and following the bolus administration of intravenous contrast. CONTRAST:  184mL ISOVUE-300 IOPAMIDOL (ISOVUE-300) INJECTION 61% COMPARISON:  07/11/2016 CT abdomen/pelvis. FINDINGS: Lower chest: No significant pulmonary nodules or acute consolidative airspace disease. Three-vessel coronary atherosclerosis. Hepatobiliary: Normal liver size. No liver mass. Normal  gallbladder with no radiopaque cholelithiasis. No biliary ductal dilatation. Pancreas: Normal, with no mass or duct dilation. Spleen: Normal size. No mass. Adrenals/Urinary Tract: Normal adrenals. No renal stones. No hydronephrosis. Normal caliber ureters, with no ureteral stones. There is a new focal contour bulge in the posterior interpolar right kidney, which appears to correlate with a nearly occult enhancing 2.0 x 1.7 cm renal cortical mass (series 20/image 25), which appears to have increased in size from 1.4 x 1.2 cm on 07/11/2016 and 1.0 x 1.0 cm on 06/22/2015 CT studies. Minimal residual fat stranding at the partial nephrectomy site in the anterior interpolar right kidney, with no evidence of an enhancing mass to suggest local recurrence. Normal bladder. Stomach/Bowel: Normal non-distended stomach. Normal caliber small bowel with no small bowel wall thickening. Normal appendix. Mild-to-moderate diffuse colonic diverticulosis, most prominent in the sigmoid colon, with no definite large  bowel wall thickening or pericolonic fat stranding. Vascular/Lymphatic: Atherosclerotic nonaneurysmal abdominal aorta. Patent portal, splenic, hepatic and renal veins. No pathologically enlarged lymph nodes in the abdomen or pelvis. Reproductive: Mildly enlarged prostate. Other: No pneumoperitoneum, ascites or focal fluid collection. Musculoskeletal: No aggressive appearing focal osseous lesions. Marked thoracolumbar spondylosis. IMPRESSION: 1. New focal contour bulge in the posterior interpolar right kidney, which appears to correlate with a nearly occult enhancing 2.0 cm renal cortical mass, suspicious for renal cell carcinoma. MRI abdomen without and with IV contrast recommended for further characterization. 2. No evidence of local tumor recurrence at the partial nephrectomy site in the anterior interpolar right kidney. 3. No evidence of metastatic disease in the abdomen. 4. Chronic findings include: Aortic Atherosclerosis  (ICD10-I70.0). Three-vessel coronary atherosclerosis. Colonic diverticulosis. Mild prostatomegaly. These results will be called to the ordering clinician or representative by the Radiologist Assistant, and communication documented in the PACS or zVision Dashboard. Electronically Signed   By: Ilona Sorrel M.D.   On: 06/19/2017 15:11   Chest 1 View  Result Date: 06/19/2017 CLINICAL DATA:  Elevated PSA.  Follow-up of renal cancer. EXAM: CHEST 1 VIEW COMPARISON:  07/11/2016 FINDINGS: Cardiomediastinal silhouette is normal. Mediastinal contours appear intact. Calcific atherosclerotic disease and tortuosity of the aorta There is no evidence of focal airspace consolidation, pleural effusion or pneumothorax. Osseous structures are without acute abnormality. Soft tissues are grossly normal. IMPRESSION: No active disease. Calcific atherosclerotic disease and tortuosity of the aorta. Please note that pulmonary nodules less than 2 cm could be easily overlooked radiographically. Electronically Signed   By: Fidela Salisbury M.D.   On: 06/19/2017 16:29   Ir Radiologist Eval & Mgmt  Result Date: 07/17/2017 Please refer to notes tab for details about interventional procedure. (Op Note)   Labs:  CBC: Recent Labs    09/26/16 0902 01/13/17 1358 01/13/17 1406  WBC 4.5 5.5 CANCELED  HGB 14.3 13.7 CANCELED  HCT 42.4 41.3 CANCELED  PLT 209 248 CANCELED    COAGS: No results for input(s): INR, APTT in the last 8760 hours.  BMP: Recent Labs    09/26/16 0902 01/13/17 1358 01/13/17 1406 06/19/17 1419  NA 141 141 CANCELED  --   K 4.8 5.2 CANCELED  --   CL 102 103 CANCELED  --   CO2 25 27 CANCELED  --   GLUCOSE 123* 82 CANCELED  --   BUN 17 17 CANCELED  --   CALCIUM 9.3 9.5 CANCELED  --   CREATININE 0.81 1.16 CANCELED 1.00  GFRNONAA 88 62  --   --   GFRAA 102 72  --   --     LIVER FUNCTION TESTS: Recent Labs    09/26/16 0902 01/13/17 1358 01/13/17 1406  BILITOT  --  0.6 CANCELED  AST 19 16  CANCELED  ALT 18 11 CANCELED  ALKPHOS  --  55 CANCELED  PROT  --  6.5 CANCELED  ALBUMIN  --  4.1 CANCELED    TUMOR MARKERS: No results for input(s): AFPTM, CEA, CA199, CHROMGRNA in the last 8760 hours.  Assessment and Plan:  New subtle 2 cm right kidney cortical lesion by surveillance CT suspicious for a developing renal carcinoma. Patient has a prior history of right partial nephrectomy for papillary renal cell carcinoma.  The image guided cryoablation procedure was reviewed in detail including the procedure, recovery, expected goals and outcomes. Also, the risks, complications, and continue surveillance were reviewed. All questions were addressed. Patient has a clear understanding of the procedure.  Plan: For further characterization of the indeterminate right renal lesion, I would recommend abdominal MRI without and with contrast.  He will return as an outpatient to review the MRI scan and make a decision to proceed with cryoablation versus continue close surveillance.  Thank you for this interesting consult.  I greatly enjoyed meeting Jason Contreras and look forward to participating in their care.  A copy of this report was sent to the requesting provider on this date.  Electronically Signed: [name] 07/17/2017, 4:05 PM   I spent a total of  40 Minutes   in face to face in clinical consultation, greater than 50% of which was counseling/coordinating care for this patient with a new 2 cm right renal mass.

## 2017-07-26 ENCOUNTER — Ambulatory Visit
Admission: RE | Admit: 2017-07-26 | Discharge: 2017-07-26 | Disposition: A | Discharge status: Home / Self Care | Payer: Medicare Other | Source: Ambulatory Visit | Attending: Interventional Radiology | Admitting: Interventional Radiology

## 2017-07-26 DIAGNOSIS — K862 Cyst of pancreas: Secondary | ICD-10-CM | POA: Diagnosis not present

## 2017-07-26 DIAGNOSIS — Z85528 Personal history of other malignant neoplasm of kidney: Secondary | ICD-10-CM | POA: Insufficient documentation

## 2017-07-26 DIAGNOSIS — N2889 Other specified disorders of kidney and ureter: Secondary | ICD-10-CM | POA: Diagnosis not present

## 2017-07-26 MED ORDER — GADOBENATE DIMEGLUMINE 529 MG/ML IV SOLN
20.0000 mL | Freq: Once | INTRAVENOUS | Status: AC | PRN
  Administered 2017-07-26: 20 mL via INTRAVENOUS

## 2017-07-29 ENCOUNTER — Ambulatory Visit (INDEPENDENT_AMBULATORY_CARE_PROVIDER_SITE_OTHER): Payer: Medicare Other | Admitting: Family Medicine

## 2017-07-29 ENCOUNTER — Encounter: Payer: Self-pay | Admitting: Family Medicine

## 2017-07-29 VITALS — BP 135/81 | HR 108 | Temp 97.9°F | Ht 70.0 in | Wt 228.2 lb

## 2017-07-29 DIAGNOSIS — I10 Essential (primary) hypertension: Secondary | ICD-10-CM

## 2017-07-29 DIAGNOSIS — E0821 Diabetes mellitus due to underlying condition with diabetic nephropathy: Secondary | ICD-10-CM

## 2017-07-29 DIAGNOSIS — D692 Other nonthrombocytopenic purpura: Secondary | ICD-10-CM | POA: Diagnosis not present

## 2017-07-29 DIAGNOSIS — R972 Elevated prostate specific antigen [PSA]: Secondary | ICD-10-CM

## 2017-07-29 DIAGNOSIS — C641 Malignant neoplasm of right kidney, except renal pelvis: Secondary | ICD-10-CM

## 2017-07-29 DIAGNOSIS — E782 Mixed hyperlipidemia: Secondary | ICD-10-CM | POA: Diagnosis not present

## 2017-07-29 LAB — LP+ALT+AST PICCOLO, WAIVED
ALT (SGPT) Piccolo, Waived: 22 U/L (ref 10–47)
AST (SGOT) Piccolo, Waived: 21 U/L (ref 11–38)
CHOL/HDL RATIO PICCOLO,WAIVE: 2.5 mg/dL
Cholesterol Piccolo, Waived: 116 mg/dL (ref <200)
HDL Chol Piccolo, Waived: 46 mg/dL — ABNORMAL LOW (ref >59)
LDL CHOL CALC PICCOLO WAIVED: 52 mg/dL (ref <100)
TRIGLYCERIDES PICCOLO,WAIVED: 91 mg/dL (ref <150)
VLDL CHOL CALC PICCOLO,WAIVE: 18 mg/dL (ref <30)

## 2017-07-29 LAB — BAYER DCA HB A1C WAIVED: HB A1C: 5.9 % (ref <7.0)

## 2017-07-29 NOTE — Assessment & Plan Note (Signed)
The current medical regimen is effective;  continue present plan and medications.  

## 2017-07-29 NOTE — Assessment & Plan Note (Signed)
Recurrent pending follow-up surgery probably next month April 2019

## 2017-07-29 NOTE — Assessment & Plan Note (Addendum)
The current medical regimen is effective;  continue present plan and medications. Better with gabapentin 600 BID

## 2017-07-29 NOTE — Progress Notes (Signed)
BP 135/81 (BP Location: Left Arm, Patient Position: Sitting, Cuff Size: Normal)   Pulse (!) 108   Temp 97.9 F (36.6 C) (Oral)   Ht 5\' 10"  (1.778 m)   Wt 228 lb 3 oz (103.5 kg)   SpO2 97%   BMI 32.74 kg/m    Subjective:    Patient ID: Jason Contreras, male    DOB: [birth year], 77 y.o.   MRN: [id number]  HPI: Jason Contreras is a 77 y.o. male  DM and med check Diabetes doing well no complaints. Unfortunately has had recurrence of renal carcinoma same area.  Pending surgery coming up most likely next month. Otherwise blood pressure diabetes doing well with no low blood sugar spells or complaints.   Relevant past medical, surgical, family and social history reviewed and updated as indicated. Interim medical history since our last visit reviewed. Allergies and medications reviewed and updated.  Review of Systems  Constitutional: Negative.   Respiratory: Negative.   Cardiovascular: Negative.     Per HPI unless specifically indicated above     Objective:    BP 135/81 (BP Location: Left Arm, Patient Position: Sitting, Cuff Size: Normal)   Pulse (!) 108   Temp 97.9 F (36.6 C) (Oral)   Ht 5\' 10"  (1.778 m)   Wt 228 lb 3 oz (103.5 kg)   SpO2 97%   BMI 32.74 kg/m   Wt Readings from Last 3 Encounters:  07/29/17 228 lb 3 oz (103.5 kg)  07/17/17 227 lb (103 kg)  07/10/17 227 lb 3.2 oz (103.1 kg)    Physical Exam  Constitutional: He is oriented to person, place, and time. He appears well-developed and well-nourished.  HENT:  Head: Normocephalic and atraumatic.  Eyes: Conjunctivae and EOM are normal.  Neck: Normal range of motion.  Cardiovascular: Normal rate, regular rhythm and normal heart sounds.  Pulmonary/Chest: Effort normal and breath sounds normal.  Musculoskeletal: Normal range of motion.  Neurological: He is alert and oriented to person, place, and time.  Skin: No erythema.  Psychiatric: He has a normal mood and affect. His behavior is normal. Judgment and  thought content normal.    Results for orders placed or performed during the hospital encounter of 06/19/17  I-STAT creatinine  Result Value Ref Range   Creatinine, Ser 1.00 0.61 - 1.24 mg/dL      Assessment & Plan:   Problem List Items Addressed This Visit      Cardiovascular and Mediastinum   Hypertension - Primary    The current medical regimen is effective;  continue present plan and medications.       Relevant Orders   Bayer DCA Hb A1c Waived   Basic metabolic panel   LP+ALT+AST Piccolo, Waived   Senile purpura ([facility])    stable        Endocrine   DM due to underlying condition with diabetic nephropathy ([place])    The current medical regimen is effective;  continue present plan and medications. Better with gabapentin 600 BID      Relevant Orders   Bayer DCA Hb A1c Waived   Basic metabolic panel   LP+ALT+AST Piccolo, Waived     Genitourinary   Adenocarcinoma, renal cell ([place])    Recurrent pending follow-up surgery probably next month April 2019        Other   Hyperlipidemia    The current medical regimen is effective;  continue present plan and medications.       Relevant Orders  Bayer DCA Hb A1c Waived   Basic metabolic panel   LP+ALT+AST Piccolo, Waived   Elevated PSA   Relevant Orders   PSA       Follow up plan: Return in about 3 months (around 10/29/2017) for Hemoglobin A1c.

## 2017-07-29 NOTE — Assessment & Plan Note (Signed)
stable °

## 2017-07-30 ENCOUNTER — Other Ambulatory Visit: Payer: Self-pay | Admitting: Family Medicine

## 2017-07-30 ENCOUNTER — Encounter: Payer: Self-pay | Admitting: Family Medicine

## 2017-07-30 LAB — BASIC METABOLIC PANEL
BUN/Creatinine Ratio: 14 (ref 10–24)
BUN: 13 mg/dL (ref 8–27)
CO2: 25 mmol/L (ref 20–29)
Calcium: 9.6 mg/dL (ref 8.6–10.2)
Chloride: 102 mmol/L (ref 96–106)
Creatinine, Ser: 0.92 mg/dL (ref 0.76–1.27)
GFR calc Af Amer: 94 mL/min/{1.73_m2} (ref >59)
GFR calc non Af Amer: 82 mL/min/{1.73_m2} (ref >59)
GLUCOSE: 109 mg/dL — AB (ref 65–99)
POTASSIUM: 4.9 mmol/L (ref 3.5–5.2)
SODIUM: 143 mmol/L (ref 134–144)

## 2017-07-30 LAB — PSA: PROSTATE SPECIFIC AG, SERUM: 6.8 ng/mL — AB (ref 0.0–4.0)

## 2017-07-31 NOTE — Telephone Encounter (Signed)
LOV  07/29/17 Dr. Jeananne Rama

## 2017-08-13 ENCOUNTER — Ambulatory Visit
Admission: RE | Admit: 2017-08-13 | Discharge: 2017-08-13 | Disposition: A | Discharge status: Home / Self Care | Payer: Medicare Other | Source: Ambulatory Visit | Attending: Interventional Radiology | Admitting: Interventional Radiology

## 2017-08-13 DIAGNOSIS — C641 Malignant neoplasm of right kidney, except renal pelvis: Secondary | ICD-10-CM | POA: Diagnosis not present

## 2017-08-13 DIAGNOSIS — N2889 Other specified disorders of kidney and ureter: Secondary | ICD-10-CM

## 2017-08-13 HISTORY — PX: IR RADIOLOGIST EVAL & MGMT: IMG5224

## 2017-08-13 NOTE — Progress Notes (Signed)
Patient ID: Jason Contreras, male   DOB: [birth year], 77 y.o.   MRN: [id number]       Chief Complaint:   New right renal mass concerning for renal cell carcinoma.  Outpatient follow-up.  Referring Physician(s): Tresean Mattix  History of Present Illness: Jason Contreras is a 77 y.o. male with a prior history of elevated PSA but negative prostate biopsy.  He has a history of right papillary renal cell carcinoma status post right partial nephrectomy August 2017.  Surveillance imaging demonstrated a new posterior medial right renal cortical lesion measuring up to 2 cm suspicious for a second developing renal cell carcinoma in the right kidney.  This was evaluated by MRI confirming a solid enhancing cortical lesion measuring 1.9 cm consistent with a renal cell carcinoma.  He remains asymptomatic.  No current flank abdominal pain.  No dysuria, hematuria or fevers.  Stable weight and appetite.  He continues to have an excellent functional status and remains active.  He returns to discuss his MRI and cryoablation.  Past Medical History:  Diagnosis Date  . Adenocarcinoma, renal cell ([place])   . Arthritis   . Diabetes mellitus without complication ([place])   . Elevated PSA   . Headache   . Hematuria   . Hyperlipidemia   . Hypertension   . Neuropathy   . Obesity   . Renal insufficiency   . Right renal mass     Past Surgical History:  Procedure Laterality Date  . APPENDECTOMY    . IR RADIOLOGIST EVAL & MGMT  07/17/2017  . IR RADIOLOGIST EVAL & MGMT  08/13/2017  . ROBOTIC ASSITED PARTIAL NEPHRECTOMY Right 12/12/2015   Procedure: ROBOTIC ASSITED PARTIAL NEPHRECTOMY;  Surgeon: [name], MD;  Location: [facility] ORS;  Service: Urology;  Laterality: Right;    Allergies: Morphine and related  Medications: Prior to Admission medications   Medication Sig Start Date End Date Taking? Authorizing Provider  aspirin-acetaminophen-caffeine (EXCEDRIN MIGRAINE) [id number] MG tablet Take 1 tablet by mouth every 6  (six) hours as needed for headache.    [provider]  atorvastatin (LIPITOR) 20 MG tablet Take 1 tablet (20 mg total) by mouth daily at 6 PM. 01/13/17   Crissman, Jeannette How, MD  carvedilol (COREG) 6.25 MG tablet Take 1 tablet (6.25 mg total) by mouth 2 (two) times daily. Patient not taking: Reported on 07/10/2017 04/22/17   [name], MD  gabapentin (NEURONTIN) 300 MG capsule Take 1 capsule (300 mg total) by mouth 4 (four) times daily. 01/13/17   [name], MD  glimepiride (AMARYL) 4 MG tablet Take 0.5 tablets (2 mg total) by mouth daily. 01/13/17   [name], MD  lidocaine (LIDODERM) 5 % Place 1 patch onto the skin daily. Remove & Discard patch within 12 hours or as directed by MD 01/13/17   [name], MD  losartan (COZAAR) 100 MG tablet Take 1 tablet (100 mg total) by mouth daily. 01/13/17   [name], MD  metFORMIN (GLUCOPHAGE) 500 MG tablet Take 2 tablets (1,000 mg total) by mouth 2 (two) times daily. 01/13/17   [name], MD  mometasone (ELOCON) 0.1 % cream APPLY  CREAM TOPICALLY ONCE DAILY 07/31/17   [name], MD  pioglitazone (ACTOS) 45 MG tablet Take 1 tablet (45 mg total) by mouth daily. 01/13/17   [name], MD  tamsulosin (FLOMAX) 0.4 MG CAPS capsule Take 1 capsule (0.4 mg total) by mouth daily. 04/22/17   [name],  MD  vitamin B-12 (CYANOCOBALAMIN) 100 MCG tablet Take 100 mcg by mouth daily.    [provider]     Family History  Problem Relation Age of Onset  . Cancer Mother        BONE  . Stroke Brother   . Kidney disease Neg Hx     Social History   Socioeconomic History  . Marital status: Married    Spouse name: Not on file  . Number of children: Not on file  . Years of education: Not on file  . Highest education level: Not on file  Occupational History  . Not on file  Social Needs  . Financial resource strain: Not on file  . Food insecurity:    Worry: Not on file    Inability: Not on file  .  Transportation needs:    Medical: Not on file    Non-medical: Not on file  Tobacco Use  . Smoking status: Never Smoker  . Smokeless tobacco: Never Used  Substance and Sexual Activity  . Alcohol use: No  . Drug use: No  . Sexual activity: Not on file  Lifestyle  . Physical activity:    Days per week: Not on file    Minutes per session: Not on file  . Stress: Not on file  Relationships  . Social connections:    Talks on phone: Not on file    Gets together: Not on file    Attends religious service: Not on file    Active member of club or organization: Not on file    Attends meetings of clubs or organizations: Not on file    Relationship status: Not on file  Other Topics Concern  . Not on file  Social History Narrative  . Not on file    ECOG Status: 0 - Asymptomatic  Review of Systems: A 12 point ROS discussed and pertinent positives are indicated in the HPI above.  All other systems are negative.  Review of Systems  Vital Signs: BP (!) 166/86 (BP Location: Right Arm, Patient Position: Sitting, Cuff Size: Normal)   Pulse 95   Temp 97.8 F (36.6 C)   Resp 18   SpO2 95%   Physical Exam  Constitutional: He is oriented to person, place, and time. He appears well-developed and well-nourished. No distress.  Eyes: Conjunctivae are normal. No scleral icterus.  Cardiovascular: Normal rate and regular rhythm.  Pulmonary/Chest: Effort normal and breath sounds normal.  Abdominal: Soft. Bowel sounds are normal.  Neurological: He is alert and oriented to person, place, and time.  Skin: He is not diaphoretic.     Imaging: Mr Abdomen Wwo Contrast  Result Date: 07/28/2017 CLINICAL DATA:  Status post partial right nephrectomy 12/12/2015 for papillary renal cell carcinoma. Suspicion of a new right renal mass on recent CT. EXAM: MRI ABDOMEN WITHOUT AND WITH CONTRAST TECHNIQUE: Multiplanar multisequence MR imaging of the abdomen was performed both before and after the administration  of intravenous contrast. CONTRAST:  33mL MULTIHANCE GADOBENATE DIMEGLUMINE 529 MG/ML IV SOLN COMPARISON:  06/19/2017 CT abdomen/pelvis. FINDINGS: Lower chest: No acute abnormality at the lung bases. Hepatobiliary: Normal liver size and configuration. No hepatic steatosis. There is a tiny T2 hyperintense 0.3 cm liver lesion at the posterior left liver lobe capsule (series 19/image 15), too small to accurately characterize, without appreciable enhancement, probably a benign cyst. No additional liver lesions. Normal gallbladder with no cholelithiasis. No biliary ductal dilatation. Common bile duct diameter 4 mm. No choledocholithiasis. Pancreas: There  is a tiny unilocular 0.5 cm pancreatic tail cystic lesion (series 3/image 21) without appreciable enhancement, wall thickening or internal septations. No additional pancreatic lesions. No pancreatic duct dilation. No pancreas divisum. Spleen: Normal size. No mass. Adrenals/Urinary Tract: Normal adrenals. No hydronephrosis. Stable postsurgical changes from partial nephrectomy in the anterior interpolar right kidney, with no evidence of a recurrent mass in the partial nephrectomy bed. There is a 1.9 x 1.7 cm avidly enhancing renal cortical mass in the medial posterior interpolar right kidney (series 3/image 34), which demonstrates heterogeneous T2 hyperintensity, most compatible with clear cell renal cell carcinoma. No additional renal masses. Stomach/Bowel: Grossly normal stomach. Visualized small and large bowel is normal caliber, with no bowel wall thickening. Scattered right colonic diverticulosis. Vascular/Lymphatic: Atherosclerotic nonaneurysmal abdominal aorta. Patent portal, splenic, hepatic and renal veins. No pathologically enlarged lymph nodes in the abdomen. Other: No abdominal ascites or focal fluid collection. Musculoskeletal: No aggressive appearing focal osseous lesions. IMPRESSION: 1. Avidly enhancing 1.9 cm renal cortical mass in the medial posterior  interpolar right kidney, most compatible with clear cell renal cell carcinoma. 2. Patent renal veins with no tumor thrombus. No evidence of metastatic disease in the abdomen. 3. No evidence of recurrent tumor in the partial nephrectomy bed in the anterior interpolar right kidney. 4. Subcentimeter nonaggressive nonenhancing pancreatic tail cystic lesion. Follow-up MRI abdomen without and with IV contrast recommended in 2 years. This recommendation follows ACR consensus guidelines: Management of Incidental Pancreatic Cysts: A White Paper of the ACR Incidental Findings Committee. [place] 1324;40:[id number]. Electronically Signed   By: Ilona Sorrel M.D.   On: 07/28/2017 08:31   Ir Radiologist Eval & Mgmt  Result Date: 08/13/2017 Please refer to notes tab for details about interventional procedure. (Op Note)  Ir Radiologist Eval & Mgmt  Result Date: 07/17/2017 Please refer to notes tab for details about interventional procedure. (Op Note)   Labs:  CBC: Recent Labs    09/26/16 0902 01/13/17 1358 01/13/17 1406  WBC 4.5 5.5 CANCELED  HGB 14.3 13.7 CANCELED  HCT 42.4 41.3 CANCELED  PLT 209 248 CANCELED    COAGS: No results for input(s): INR, APTT in the last 8760 hours.  BMP: Recent Labs    09/26/16 0902 01/13/17 1358 01/13/17 1406 06/19/17 1419 07/29/17 1406  NA 141 141 CANCELED  --  143  K 4.8 5.2 CANCELED  --  4.9  CL 102 103 CANCELED  --  102  CO2 25 27 CANCELED  --  25  GLUCOSE 123* 82 CANCELED  --  109*  BUN 17 17 CANCELED  --  13  CALCIUM 9.3 9.5 CANCELED  --  9.6  CREATININE 0.81 1.16 CANCELED 1.00 0.92  GFRNONAA 88 62  --   --  82  GFRAA 102 72  --   --  94    LIVER FUNCTION TESTS: Recent Labs    09/26/16 0902 01/13/17 1358 01/13/17 1406 07/29/17 1354  BILITOT  --  0.6 CANCELED  --   AST 19 16 CANCELED 21  ALT 18 11 CANCELED 22  ALKPHOS  --  55 CANCELED  --   PROT  --  6.5 CANCELED  --   ALBUMIN  --  4.1 CANCELED  --     TUMOR MARKERS: No results  for input(s): AFPTM, CEA, CA199, CHROMGRNA in the last 8760 hours.  Assessment and Plan:  MRI confirms a 1.9 cm right renal cortical enhancing solid mass compatible with renal cell carcinoma.  This will be the  second renal cancer in the right kidney.  Lesion size and location is amenable to cryoablation.  The CT-guided cryoablation procedure was reviewed in detail including the use of general anesthesia, technical aspect of the procedure, recovery, expected goals and outcomes.  All risks, complications and continued surveillance were reviewed.  All questions were addressed.  MRI was reviewed today with the patient and his family.  He has a clear understanding of the procedure.  At this time, he would like to schedule this electively in the next few weeks.  Plan: Scheduled for CT-guided right renal cell carcinoma cry[facility]ablation at [facility] in the next few weeks.    Electronically Signed: [name] 08/13/2017, 2:08 PM   I spent a total of    25 Minutes in face to face in clinical consultation, greater than 50% of which was counseling/coordinating care for this patient with a 1.9 cm right renal cell carcinoma.

## 2017-08-28 ENCOUNTER — Other Ambulatory Visit (HOSPITAL_COMMUNITY): Payer: Self-pay | Admitting: Interventional Radiology

## 2017-08-28 DIAGNOSIS — C641 Malignant neoplasm of right kidney, except renal pelvis: Secondary | ICD-10-CM

## 2017-10-10 ENCOUNTER — Other Ambulatory Visit: Payer: Self-pay | Admitting: Radiology

## 2017-10-10 NOTE — Progress Notes (Signed)
Need epci orders for dr schick procedure 6-13 pre op is 6-11 thanks

## 2017-10-13 NOTE — Patient Instructions (Addendum)
Jason Contreras  10/13/2017   Your procedure is scheduled on: 10-17-17   Report to [facility] Main  Entrance    Report to Admitting at 6:30 AM    Call this number if you have problems the morning of surgery [phone]   Remember: Do not eat food or drink liquids :After Midnight.     Take these medicines the morning of surgery with A SIP OF WATER: Gabapentin (Neurontin)                                You may not have any metal on your body including hair pins and              piercings  Do not wear jewelry, lotions, powders or deodorant             Men may shave face and neck.   Do not bring valuables to the hospital. [place].  Contacts, dentures or bridgework may not be worn into surgery.  Leave suitcase in the car. After surgery it may be brought to your room.      Special Instructions: N/A              Please read over the following fact sheets you were given: _____________________________________________________________________  How to Manage Your Diabetes Before and After Surgery  Why is it important to control my blood sugar before and after surgery? . Improving blood sugar levels before and after surgery helps healing and can limit problems. . A way of improving blood sugar control is eating a healthy diet by: o  Eating less sugar and carbohydrates o  Increasing activity/exercise o  Talking with your doctor about reaching your blood sugar goals . High blood sugars (greater than 180 mg/dL) can raise your risk of infections and slow your recovery, so you will need to focus on controlling your diabetes during the weeks before surgery. . Make sure that the doctor who takes care of your diabetes knows about your planned surgery including the date and location.  How do I manage my blood sugar before surgery? . Check your blood sugar at least 4 times a day, starting 2 days before surgery, to make  sure that the level is not too high or low. o Check your blood sugar the morning of your surgery when you wake up and every 2 hours until you get to the Short Stay unit. . If your blood sugar is less than 70 mg/dL, you will need to treat for low blood sugar: o Do not take insulin. o Treat a low blood sugar (less than 70 mg/dL) with  cup of clear juice (cranberry or apple), 4 glucose tablets, OR glucose gel. o Recheck blood sugar in 15 minutes after treatment (to make sure it is greater than 70 mg/dL). If your blood sugar is not greater than 70 mg/dL on recheck, call [phone] for further instructions. . Report your blood sugar to the short stay nurse when you get to Short Stay.  . If you are admitted to the hospital after surgery: o Your blood sugar will be checked by the staff and you will probably be given insulin after surgery (instead of oral diabetes medicines) to make sure you have  good blood sugar levels. o The goal for blood sugar control after surgery is 80-180 mg/dL.   WHAT DO I DO ABOUT MY DIABETES MEDICATION?  [name] Do not take oral diabetes medicines (pills) the morning of surgery.  . THE DAYBEFORE SURGERY, take your usual dose of Metformin, and only your morning dose of  Actos, and Glimepiride (Amaryl)                 Jason Contreras - Preparing for Surgery Before surgery, you can play an important role.  Because skin is not sterile, your skin needs to be as free of germs as possible.  You can reduce the number of germs on your skin by washing with CHG (chlorahexidine gluconate) soap before surgery.  CHG is an antiseptic cleaner which kills germs and bonds with the skin to continue killing germs even after washing. Please DO NOT use if you have an allergy to CHG or antibacterial soaps.  If your skin becomes reddened/irritated stop using the CHG and inform your nurse when you arrive at Short Stay. Do not shave (including legs and underarms) for at least 48 hours prior to the first  CHG shower.  You may shave your face/neck. Please follow these instructions carefully:  1.  Shower with CHG Soap the night before surgery and the  morning of Surgery.  2.  If you choose to wash your hair, wash your hair first as usual with your  normal  shampoo.  3.  After you shampoo, rinse your hair and body thoroughly to remove the  shampoo.                           4.  Use CHG as you would any other liquid soap.  You can apply chg directly  to the skin and wash                       Gently with a scrungie or clean washcloth.  5.  Apply the CHG Soap to your body ONLY FROM THE NECK DOWN.   Do not use on face/ open                           Wound or open sores. Avoid contact with eyes, ears mouth and genitals (private parts).                       Wash face,  Genitals (private parts) with your normal soap.             6.  Wash thoroughly, paying special attention to the area where your surgery  will be performed.  7.  Thoroughly rinse your body with warm water from the neck down.  8.  DO NOT shower/wash with your normal soap after using and rinsing off  the CHG Soap.                9.  Pat yourself dry with a clean towel.            10.  Wear clean pajamas.            11.  Place clean sheets on your bed the night of your first shower and do not  sleep with pets. Day of Surgery : Do not apply any lotions/deodorants the morning of surgery.  Please wear clean clothes to the hospital/surgery center.  FAILURE  TO FOLLOW THESE INSTRUCTIONS MAY RESULT IN THE CANCELLATION OF YOUR SURGERY PATIENT SIGNATURE_________________________________  NURSE SIGNATURE__________________________________  ________________________________________________________________________

## 2017-10-14 ENCOUNTER — Encounter (HOSPITAL_COMMUNITY): Payer: Self-pay

## 2017-10-14 ENCOUNTER — Encounter (HOSPITAL_COMMUNITY)
Admission: RE | Admit: 2017-10-14 | Discharge: 2017-10-14 | Disposition: A | Discharge status: Home / Self Care | Payer: Medicare Other | Source: Ambulatory Visit | Attending: Interventional Radiology | Admitting: Interventional Radiology

## 2017-10-14 ENCOUNTER — Other Ambulatory Visit: Payer: Self-pay

## 2017-10-14 DIAGNOSIS — R9431 Abnormal electrocardiogram [ECG] [EKG]: Secondary | ICD-10-CM | POA: Insufficient documentation

## 2017-10-14 DIAGNOSIS — Z01812 Encounter for preprocedural laboratory examination: Secondary | ICD-10-CM | POA: Insufficient documentation

## 2017-10-14 DIAGNOSIS — N2889 Other specified disorders of kidney and ureter: Secondary | ICD-10-CM | POA: Insufficient documentation

## 2017-10-14 DIAGNOSIS — Z0183 Encounter for blood typing: Secondary | ICD-10-CM | POA: Insufficient documentation

## 2017-10-14 DIAGNOSIS — Z01818 Encounter for other preprocedural examination: Secondary | ICD-10-CM | POA: Insufficient documentation

## 2017-10-14 DIAGNOSIS — Z885 Allergy status to narcotic agent status: Secondary | ICD-10-CM | POA: Diagnosis not present

## 2017-10-14 DIAGNOSIS — C641 Malignant neoplasm of right kidney, except renal pelvis: Secondary | ICD-10-CM | POA: Diagnosis not present

## 2017-10-14 DIAGNOSIS — E785 Hyperlipidemia, unspecified: Secondary | ICD-10-CM | POA: Diagnosis not present

## 2017-10-14 DIAGNOSIS — Z6832 Body mass index (BMI) 32.0-32.9, adult: Secondary | ICD-10-CM | POA: Diagnosis not present

## 2017-10-14 DIAGNOSIS — Z7984 Long term (current) use of oral hypoglycemic drugs: Secondary | ICD-10-CM | POA: Diagnosis not present

## 2017-10-14 DIAGNOSIS — M1990 Unspecified osteoarthritis, unspecified site: Secondary | ICD-10-CM | POA: Diagnosis not present

## 2017-10-14 DIAGNOSIS — E1140 Type 2 diabetes mellitus with diabetic neuropathy, unspecified: Secondary | ICD-10-CM | POA: Diagnosis not present

## 2017-10-14 DIAGNOSIS — I10 Essential (primary) hypertension: Secondary | ICD-10-CM | POA: Diagnosis not present

## 2017-10-14 DIAGNOSIS — E669 Obesity, unspecified: Secondary | ICD-10-CM | POA: Diagnosis not present

## 2017-10-14 HISTORY — DX: Other specified postprocedural states: Z98.890

## 2017-10-14 HISTORY — DX: Nausea with vomiting, unspecified: R11.2

## 2017-10-14 LAB — CBC WITH DIFFERENTIAL/PLATELET
BASOS ABS: 0 10*3/uL (ref 0.0–0.1)
Basophils Relative: 0 %
Eosinophils Absolute: 0.1 10*3/uL (ref 0.0–0.7)
Eosinophils Relative: 1 %
HEMATOCRIT: 42.5 % (ref 39.0–52.0)
Hemoglobin: 14.1 g/dL (ref 13.0–17.0)
LYMPHS ABS: 1.2 10*3/uL (ref 0.7–4.0)
LYMPHS PCT: 20 %
MCH: 32.3 pg (ref 26.0–34.0)
MCHC: 33.2 g/dL (ref 30.0–36.0)
MCV: 97.5 fL (ref 78.0–100.0)
MONO ABS: 0.5 10*3/uL (ref 0.1–1.0)
Monocytes Relative: 8 %
NEUTROS ABS: 4.1 10*3/uL (ref 1.7–7.7)
Neutrophils Relative %: 71 %
Platelets: 235 10*3/uL (ref 150–400)
RBC: 4.36 MIL/uL (ref 4.22–5.81)
RDW: 13.8 % (ref 11.5–15.5)
WBC: 5.8 10*3/uL (ref 4.0–10.5)

## 2017-10-14 LAB — BASIC METABOLIC PANEL
ANION GAP: 9 (ref 5–15)
BUN: 16 mg/dL (ref 6–20)
CHLORIDE: 105 mmol/L (ref 101–111)
CO2: 29 mmol/L (ref 22–32)
Calcium: 9.4 mg/dL (ref 8.9–10.3)
Creatinine, Ser: 1.08 mg/dL (ref 0.61–1.24)
GFR calc Af Amer: >60 mL/min (ref >60)
GLUCOSE: 103 mg/dL — AB (ref 65–99)
POTASSIUM: 4.9 mmol/L (ref 3.5–5.1)
Sodium: 143 mmol/L (ref 135–145)

## 2017-10-14 LAB — ABO/RH: ABO/RH(D): A POS

## 2017-10-14 LAB — HEMOGLOBIN A1C
Hgb A1c MFr Bld: 5.8 % — ABNORMAL HIGH (ref 4.8–5.6)
Mean Plasma Glucose: 119.76 mg/dL

## 2017-10-14 LAB — GLUCOSE, CAPILLARY: GLUCOSE-CAPILLARY: 128 mg/dL — AB (ref 65–99)

## 2017-10-14 LAB — PROTIME-INR
INR: 0.98
Prothrombin Time: 12.9 seconds (ref 11.4–15.2)

## 2017-10-15 ENCOUNTER — Other Ambulatory Visit: Payer: Self-pay | Admitting: Radiology

## 2017-10-15 NOTE — Progress Notes (Signed)
Late entry: Anesthesia Consult with Dr. Kalman Shan per order. Per Dr. Kalman Shan they will consult with patient on day of surgery.

## 2017-10-17 ENCOUNTER — Other Ambulatory Visit: Payer: Self-pay | Admitting: Radiology

## 2017-10-17 ENCOUNTER — Ambulatory Visit (HOSPITAL_COMMUNITY): Payer: Medicare Other | Admitting: Certified Registered Nurse Anesthetist

## 2017-10-17 ENCOUNTER — Ambulatory Visit (HOSPITAL_COMMUNITY)
Admission: RE | Admit: 2017-10-17 | Discharge: 2017-10-17 | Disposition: A | Discharge status: Home / Self Care | Payer: Medicare Other | Source: Ambulatory Visit | Attending: Interventional Radiology | Admitting: Interventional Radiology

## 2017-10-17 ENCOUNTER — Observation Stay (HOSPITAL_COMMUNITY)
Admission: RE | Admit: 2017-10-17 | Discharge: 2017-10-17 | Disposition: A | Discharge status: Home / Self Care | Payer: Medicare Other | Source: Ambulatory Visit | Attending: Interventional Radiology | Admitting: Interventional Radiology

## 2017-10-17 ENCOUNTER — Encounter (HOSPITAL_COMMUNITY): Payer: Self-pay

## 2017-10-17 ENCOUNTER — Encounter (HOSPITAL_COMMUNITY): Payer: Self-pay | Admitting: Certified Registered Nurse Anesthetist

## 2017-10-17 ENCOUNTER — Encounter (HOSPITAL_COMMUNITY)
Admission: RE | Disposition: A | Discharge status: Home / Self Care | Payer: Self-pay | Source: Ambulatory Visit | Attending: Interventional Radiology

## 2017-10-17 ENCOUNTER — Other Ambulatory Visit: Payer: Self-pay

## 2017-10-17 DIAGNOSIS — E1140 Type 2 diabetes mellitus with diabetic neuropathy, unspecified: Secondary | ICD-10-CM | POA: Diagnosis not present

## 2017-10-17 DIAGNOSIS — E1121 Type 2 diabetes mellitus with diabetic nephropathy: Secondary | ICD-10-CM | POA: Diagnosis not present

## 2017-10-17 DIAGNOSIS — I10 Essential (primary) hypertension: Secondary | ICD-10-CM | POA: Diagnosis not present

## 2017-10-17 DIAGNOSIS — C641 Malignant neoplasm of right kidney, except renal pelvis: Secondary | ICD-10-CM | POA: Diagnosis not present

## 2017-10-17 DIAGNOSIS — E785 Hyperlipidemia, unspecified: Secondary | ICD-10-CM | POA: Insufficient documentation

## 2017-10-17 DIAGNOSIS — Z6832 Body mass index (BMI) 32.0-32.9, adult: Secondary | ICD-10-CM | POA: Insufficient documentation

## 2017-10-17 DIAGNOSIS — M1990 Unspecified osteoarthritis, unspecified site: Secondary | ICD-10-CM | POA: Diagnosis not present

## 2017-10-17 DIAGNOSIS — Z885 Allergy status to narcotic agent status: Secondary | ICD-10-CM | POA: Insufficient documentation

## 2017-10-17 DIAGNOSIS — N2889 Other specified disorders of kidney and ureter: Secondary | ICD-10-CM

## 2017-10-17 DIAGNOSIS — Z85528 Personal history of other malignant neoplasm of kidney: Secondary | ICD-10-CM | POA: Diagnosis present

## 2017-10-17 DIAGNOSIS — Z7984 Long term (current) use of oral hypoglycemic drugs: Secondary | ICD-10-CM | POA: Insufficient documentation

## 2017-10-17 DIAGNOSIS — E669 Obesity, unspecified: Secondary | ICD-10-CM | POA: Insufficient documentation

## 2017-10-17 DIAGNOSIS — R972 Elevated prostate specific antigen [PSA]: Secondary | ICD-10-CM | POA: Diagnosis not present

## 2017-10-17 HISTORY — PX: RADIOLOGY WITH ANESTHESIA: SHX6223

## 2017-10-17 HISTORY — PX: CRYOABLATION: SHX1415

## 2017-10-17 LAB — TYPE AND SCREEN
ABO/RH(D): A POS
ANTIBODY SCREEN: NEGATIVE

## 2017-10-17 LAB — GLUCOSE, CAPILLARY
Glucose-Capillary: 118 mg/dL — ABNORMAL HIGH (ref 65–99)
Glucose-Capillary: 115 mg/dL — ABNORMAL HIGH (ref 65–99)

## 2017-10-17 SURGERY — CT WITH ANESTHESIA
Anesthesia: General | Laterality: Right

## 2017-10-17 MED ORDER — SUGAMMADEX SODIUM 200 MG/2ML IV SOLN
INTRAVENOUS | Status: DC | PRN
  Administered 2017-10-17: 200 mg via INTRAVENOUS

## 2017-10-17 MED ORDER — DEXAMETHASONE SODIUM PHOSPHATE 10 MG/ML IJ SOLN
INTRAMUSCULAR | Status: DC | PRN
  Administered 2017-10-17: 4 mg via INTRAVENOUS

## 2017-10-17 MED ORDER — HYDROCODONE-ACETAMINOPHEN 5-325 MG PO TABS: 1.0000 | ORAL_TABLET | ORAL | Status: DC | PRN

## 2017-10-17 MED ORDER — ROCURONIUM BROMIDE 10 MG/ML (PF) SYRINGE
PREFILLED_SYRINGE | INTRAVENOUS | Status: DC | PRN
  Administered 2017-10-17: 50 mg via INTRAVENOUS

## 2017-10-17 MED ORDER — METFORMIN HCL 500 MG PO TABS
1000.0000 mg | ORAL_TABLET | Freq: Two times a day (BID) | ORAL | Status: DC
  Administered 2017-10-17: 1000 mg via ORAL
  Filled 2017-10-17: qty 2

## 2017-10-17 MED ORDER — CEFAZOLIN SODIUM-DEXTROSE 2-4 GM/100ML-% IV SOLN
2.0000 g | INTRAVENOUS | Status: AC
  Administered 2017-10-17: 2 g via INTRAVENOUS
  Filled 2017-10-17: qty 100

## 2017-10-17 MED ORDER — ONDANSETRON HCL 4 MG/2ML IJ SOLN
INTRAMUSCULAR | Status: DC | PRN
  Administered 2017-10-17: 4 mg via INTRAVENOUS

## 2017-10-17 MED ORDER — HYDROMORPHONE HCL 1 MG/ML IJ SOLN: 0.2500 mg | INTRAMUSCULAR | Status: DC | PRN

## 2017-10-17 MED ORDER — LACTATED RINGERS IV SOLN
INTRAVENOUS | Status: DC
  Administered 2017-10-17 (×2): via INTRAVENOUS

## 2017-10-17 MED ORDER — ATORVASTATIN CALCIUM 20 MG PO TABS
20.0000 mg | ORAL_TABLET | Freq: Every day | ORAL | Status: DC
  Filled 2017-10-17: qty 1

## 2017-10-17 MED ORDER — DOCUSATE SODIUM 100 MG PO CAPS: 100.0000 mg | ORAL_CAPSULE | Freq: Two times a day (BID) | ORAL | Status: DC

## 2017-10-17 MED ORDER — METFORMIN HCL 500 MG PO TABS: 1000.0000 mg | ORAL_TABLET | Freq: Two times a day (BID) | ORAL | Status: DC

## 2017-10-17 MED ORDER — EPHEDRINE SULFATE-NACL 50-0.9 MG/10ML-% IV SOSY
PREFILLED_SYRINGE | INTRAVENOUS | Status: DC | PRN
  Administered 2017-10-17: 10 mg via INTRAVENOUS

## 2017-10-17 MED ORDER — PROPOFOL 10 MG/ML IV BOLUS
INTRAVENOUS | Status: DC | PRN
  Administered 2017-10-17: 100 mg via INTRAVENOUS

## 2017-10-17 MED ORDER — LOSARTAN POTASSIUM 50 MG PO TABS
100.0000 mg | ORAL_TABLET | Freq: Every day | ORAL | Status: DC
  Administered 2017-10-17: 100 mg via ORAL
  Filled 2017-10-17: qty 2

## 2017-10-17 MED ORDER — PHENYLEPHRINE 40 MCG/ML (10ML) SYRINGE FOR IV PUSH (FOR BLOOD PRESSURE SUPPORT)
PREFILLED_SYRINGE | INTRAVENOUS | Status: DC | PRN
  Administered 2017-10-17: 120 ug via INTRAVENOUS
  Administered 2017-10-17 (×4): 80 ug via INTRAVENOUS

## 2017-10-17 MED ORDER — ACETAMINOPHEN 325 MG PO TABS
650.0000 mg | ORAL_TABLET | Freq: Every day | ORAL | Status: DC | PRN
  Administered 2017-10-17: 650 mg via ORAL
  Filled 2017-10-17: qty 2

## 2017-10-17 MED ORDER — MIDAZOLAM HCL 5 MG/5ML IJ SOLN
INTRAMUSCULAR | Status: DC | PRN
  Administered 2017-10-17: 2 mg via INTRAVENOUS

## 2017-10-17 MED ORDER — PIOGLITAZONE HCL 45 MG PO TABS
45.0000 mg | ORAL_TABLET | Freq: Every day | ORAL | Status: DC
  Administered 2017-10-17: 45 mg via ORAL
  Filled 2017-10-17: qty 1

## 2017-10-17 MED ORDER — GLIMEPIRIDE 2 MG PO TABS
2.0000 mg | ORAL_TABLET | Freq: Every day | ORAL | Status: DC
  Filled 2017-10-17: qty 1

## 2017-10-17 MED ORDER — IOPAMIDOL (ISOVUE-370) INJECTION 76%
INTRAVENOUS | Status: AC
  Filled 2017-10-17: qty 100

## 2017-10-17 MED ORDER — ONDANSETRON HCL 4 MG/2ML IJ SOLN: 4.0000 mg | Freq: Once | INTRAMUSCULAR | Status: DC | PRN

## 2017-10-17 MED ORDER — ONDANSETRON HCL 4 MG/2ML IJ SOLN: 4.0000 mg | Freq: Four times a day (QID) | INTRAMUSCULAR | Status: DC | PRN

## 2017-10-17 MED ORDER — LIDOCAINE 2% (20 MG/ML) 5 ML SYRINGE
INTRAMUSCULAR | Status: DC | PRN
  Administered 2017-10-17: 6 mg via INTRAVENOUS

## 2017-10-17 MED ORDER — PROPOFOL 10 MG/ML IV BOLUS
INTRAVENOUS | Status: AC
  Filled 2017-10-17: qty 40

## 2017-10-17 MED ORDER — FENTANYL CITRATE (PF) 100 MCG/2ML IJ SOLN
INTRAMUSCULAR | Status: DC | PRN
  Administered 2017-10-17: 150 ug via INTRAVENOUS
  Administered 2017-10-17 (×2): 50 ug via INTRAVENOUS

## 2017-10-17 MED ORDER — IOPAMIDOL (ISOVUE-370) INJECTION 76%: 100.0000 mL | Freq: Once | INTRAVENOUS | Status: DC | PRN

## 2017-10-17 MED ORDER — FENTANYL CITRATE (PF) 250 MCG/5ML IJ SOLN
INTRAMUSCULAR | Status: AC
  Filled 2017-10-17: qty 5

## 2017-10-17 MED ORDER — TAMSULOSIN HCL 0.4 MG PO CAPS
0.4000 mg | ORAL_CAPSULE | Freq: Every day | ORAL | Status: DC
  Administered 2017-10-17: 0.4 mg via ORAL
  Filled 2017-10-17: qty 1

## 2017-10-17 MED ORDER — MEPERIDINE HCL 50 MG/ML IJ SOLN: 6.2500 mg | INTRAMUSCULAR | Status: DC | PRN

## 2017-10-17 MED ORDER — MIDAZOLAM HCL 2 MG/2ML IJ SOLN
INTRAMUSCULAR | Status: AC
  Filled 2017-10-17: qty 2

## 2017-10-17 NOTE — Transfer of Care (Signed)
Immediate Anesthesia Transfer of Care Note  Patient: Jason Contreras  Procedure(s) Performed: CT WITH ANESTHESIA RENAL CRYOABLATION (Right )  Patient Location: PACU  Anesthesia Type:General  Level of Consciousness: drowsy  Airway & Oxygen Therapy: Patient Spontanous Breathing and Patient connected to face mask  Post-op Assessment: Report given to RN and Post -op Vital signs reviewed and stable  Post vital signs: Reviewed and stable  Last Vitals:  Vitals Value Taken Time  BP 166/87 10/17/2017 10:52 AM  Temp    Pulse 102 10/17/2017 10:52 AM  Resp    SpO2 100 % 10/17/2017 10:52 AM  Vitals shown include unvalidated device data.  Last Pain:  Vitals:   10/17/17 0705  TempSrc:   PainSc: 0-No pain         Complications: No apparent anesthesia complications

## 2017-10-17 NOTE — Progress Notes (Signed)
Patient remains very sleepy but easily arousable, vital signs monitored as ordered, remained stable, dressing on the right flank dry and intact.no swelling or hematoma observed.foley intact and draining clear yellow urine.

## 2017-10-17 NOTE — H&P (Deleted)
  The note originally documented on this encounter has been moved the the encounter in which it belongs.  

## 2017-10-17 NOTE — Progress Notes (Signed)
Pt doing well. Minimal pain, no N/V. Has tolerated regular diet. Foley removed about 30 minutes, hasn't voided yet.  BP (!) 160/80 (BP Location: Right Arm)   Pulse (!) 120   Temp 98 F (36.7 C)   Resp 18   Ht 5\' 10"  (1.778 m)   Wt 225 lb (102.1 kg)   SpO2 99%   BMI 32.28 kg/m  Abd: soft, NT (R)post stick site clean, dry, NT. No hematoma  S/p (R)renal cryoablation Pt looks great. Will DC home this evening if able to void on own. Reviewed all instructions and follow up plans.  Ascencion Dike PA-C Interventional Radiology 10/17/2017 4:06 PM

## 2017-10-17 NOTE — H&P (Signed)
Chief Complaint: Patient was seen in consultation today for right renal mass at the request of Jason Contreras  Referring Physician(s): Jason Contreras  Supervising Physician: Jason Contreras  Patient Status: Jason Contreras - Out-pt  History of Present Illness: Jason Contreras is a 77 y.o. male with hx of right papillary renal cell cancer and prior partial right nephrectomy in 2017. He now has 2 cm lesion suspicious for recurrent RCC on the right. He was seen in consultation by Dr. Annamaria Contreras and is now scheduled for CT guided cryoablation of this right renal lesion. PMHx, meds, labs, imaging, allergies reviewed. Feels well, no recent fevers, chills, illness. Has been NPO today as directed. Family at bedside.   Past Medical History:  Diagnosis Date  . Adenocarcinoma, renal cell ([place])   . Arthritis   . Diabetes mellitus without complication ([place])   . Elevated PSA   . Headache   . Hematuria   . Hyperlipidemia   . Hypertension   . Neuropathy   . Obesity   . PONV (postoperative nausea and vomiting)   . Renal insufficiency   . Right renal mass     Past Surgical History:  Procedure Laterality Date  . APPENDECTOMY    . IR RADIOLOGIST EVAL & MGMT  07/17/2017  . IR RADIOLOGIST EVAL & MGMT  08/13/2017  . ROBOTIC ASSITED PARTIAL NEPHRECTOMY Right 12/12/2015   Procedure: ROBOTIC ASSITED PARTIAL NEPHRECTOMY;  Surgeon: Jason Espy, MD;  Location: [facility] ORS;  Service: Urology;  Laterality: Right;    Allergies: Morphine and related  Medications: Prior to Admission medications   Medication Sig Start Date End Date Taking? Authorizing Provider  acetaminophen (TYLENOL) 325 MG tablet Take 650 mg by mouth daily as needed for headache.    [provider]  atorvastatin (LIPITOR) 20 MG tablet Take 1 tablet (20 mg total) by mouth daily at 6 PM. 01/13/17   Contreras, Jason How, MD  carvedilol (COREG) 6.25 MG tablet Take 1 tablet (6.25 mg total) by mouth 2 (two) times daily. Patient not taking: Reported  on 07/10/2017 04/22/17   Jason Maple, MD  gabapentin (NEURONTIN) 300 MG capsule Take 1 capsule (300 mg total) by mouth 4 (four) times daily. Patient taking differently: Take 300 mg by mouth 4 (four) times daily.  01/13/17   Jason Maple, MD  glimepiride (AMARYL) 4 MG tablet Take 0.5 tablets (2 mg total) by mouth daily. 01/13/17   Jason Maple, MD  lidocaine (LIDODERM) 5 % Place 1 patch onto the skin daily. Remove & Discard patch within 12 hours or as directed by MD Patient taking differently: Place 1 patch onto the skin daily as needed (pain). Remove & Discard patch within 12 hours or as directed by MD 01/13/17   Jason Maple, MD  losartan (COZAAR) 100 MG tablet Take 1 tablet (100 mg total) by mouth daily. 01/13/17   Jason Maple, MD  metFORMIN (GLUCOPHAGE) 500 MG tablet Take 2 tablets (1,000 mg total) by mouth 2 (two) times daily. 01/13/17   Jason Maple, MD  mometasone (ELOCON) 0.1 % cream APPLY  CREAM TOPICALLY ONCE DAILY Patient taking differently: APPLY  CREAM TOPICALLY ONCE DAILY AS NEEDED FOR RASH 07/31/17   Jason Maple, MD  pioglitazone (ACTOS) 45 MG tablet Take 1 tablet (45 mg total) by mouth daily. 01/13/17   Jason Maple, MD  tamsulosin (FLOMAX) 0.4 MG CAPS capsule Take 1 capsule (0.4 mg total) by mouth daily. 04/22/17   Jason Maple, MD  Family History  Problem Relation Age of Onset  . Cancer Mother        BONE  . Stroke Brother   . Kidney disease Neg Hx     Social History   Socioeconomic History  . Marital status: Married    Spouse name: Not on file  . Number of children: Not on file  . Years of education: Not on file  . Highest education level: Not on file  Occupational History  . Not on file  Social Needs  . Financial resource strain: Not on file  . Food insecurity:    Worry: Not on file    Inability: Not on file  . Transportation needs:    Medical: Not on file    Non-medical: Not on file  Tobacco Use  . Smoking status: Never  Smoker  . Smokeless tobacco: Never Used  Substance and Sexual Activity  . Alcohol use: No  . Drug use: No  . Sexual activity: Not on file  Lifestyle  . Physical activity:    Days per week: Not on file    Minutes per session: Not on file  . Stress: Not on file  Relationships  . Social connections:    Talks on phone: Not on file    Gets together: Not on file    Attends religious service: Not on file    Active member of club or organization: Not on file    Attends meetings of clubs or organizations: Not on file    Relationship status: Not on file  Other Topics Concern  . Not on file  Social History Narrative  . Not on file     Review of Systems: A 12 point ROS discussed and pertinent positives are indicated in the HPI above.  All other systems are negative.  Review of Systems  Vital Signs: BP (!) 151/92   Pulse (!) 109   Temp 98.7 F (37.1 C) (Oral)   Resp 18   Ht 5\' 10"  (1.778 m)   Wt 225 lb (102.1 kg)   SpO2 100%   BMI 32.28 kg/m   Physical Exam  Constitutional: He is oriented to person, place, and time. He appears well-developed. No distress.  HENT:  Head: Normocephalic.  Mouth/Throat: Oropharynx is clear and moist.  Neck: Normal range of motion. No JVD present.  Cardiovascular: Normal rate, regular rhythm and normal heart sounds.  Pulmonary/Chest: Effort normal and breath sounds normal. No respiratory distress.  Abdominal: Soft. He exhibits no mass. There is no tenderness.  Neurological: He is alert and oriented to person, place, and time.  Skin: Skin is warm and dry.  Psychiatric: He has a normal mood and affect.    Imaging: No results found.  Labs:  CBC: Recent Labs    01/13/17 1358 01/13/17 1406 10/14/17 1530  WBC 5.5 CANCELED 5.8  HGB 13.7 CANCELED 14.1  HCT 41.3 CANCELED 42.5  PLT 248 CANCELED 235    COAGS: Recent Labs    10/14/17 1530  INR 0.98    BMP: Recent Labs    01/13/17 1358 01/13/17 1406 06/19/17 1419 07/29/17 1406  10/14/17 1530  NA 141 CANCELED  --  143 143  K 5.2 CANCELED  --  4.9 4.9  CL 103 CANCELED  --  102 105  CO2 27 CANCELED  --  25 29  GLUCOSE 82 CANCELED  --  109* 103*  BUN 17 CANCELED  --  13 16  CALCIUM 9.5 CANCELED  --  9.6 9.4  CREATININE 1.16 CANCELED 1.00 0.92 1.08  GFRNONAA 62  --   --  82 >60  GFRAA 72  --   --  94 >60    LIVER FUNCTION TESTS: Recent Labs    01/13/17 1358 01/13/17 1406 07/29/17 1354  BILITOT 0.6 CANCELED  --   AST 16 CANCELED 21  ALT 11 CANCELED 22  ALKPHOS 55 CANCELED  --   PROT 6.5 CANCELED  --   ALBUMIN 4.1 CANCELED  --     TUMOR MARKERS: No results for input(s): AFPTM, CEA, CA199, CHROMGRNA in the last 8760 hours.  Assessment and Plan: (R)renal mass in setting of prior hx of RCC For CT guided cryoablation (R)renal mass today Labs ok Risks and benefits discussed with the patient including, but not limited to failure to treat entire lesion, bleeding, infection, damage to adjacent structures, decrease in renal function or post procedural neuropathy.  All of the patient's questions were answered, patient is agreeable to proceed. Consent signed and in chart.  Possible admit overnight for observation  Thank you for this interesting consult.  I greatly enjoyed meeting Jason Contreras and look forward to participating in their care.  A copy of this report was sent to the requesting provider on this date.  Electronically Signed: Ascencion Dike, PA-C 10/17/2017, 8:09 AM   I spent a total of 20 minutes in face to face in clinical consultation, greater than 50% of which was counseling/coordinating care for right renal cryoablation

## 2017-10-17 NOTE — Anesthesia Preprocedure Evaluation (Signed)
Anesthesia Evaluation  Patient identified by MRN, date of birth, ID band Patient awake    Reviewed: Allergy & Precautions, NPO status , Patient's Chart, lab work & pertinent test results  History of Anesthesia Complications (+) PONV  Airway Mallampati: I  TM Distance: >3 FB Neck ROM: Full    Dental   Pulmonary    Pulmonary exam normal        Cardiovascular hypertension, Pt. on medications Normal cardiovascular exam     Neuro/Psych    GI/Hepatic   Endo/Other  diabetes, Type 2, Oral Hypoglycemic Agents  Renal/GU Renal InsufficiencyRenal disease     Musculoskeletal   Abdominal   Peds  Hematology   Anesthesia Other Findings   Reproductive/Obstetrics                             Anesthesia Physical Anesthesia Plan  ASA: III  Anesthesia Plan: General   Post-op Pain Management:    Induction: Intravenous  PONV Risk Score and Plan: 3 and Ondansetron and Treatment may vary due to age or medical condition  Airway Management Planned: Oral ETT  Additional Equipment:   Intra-op Plan:   Post-operative Plan: Extubation in OR  Informed Consent: I have reviewed the patients History and Physical, chart, labs and discussed the procedure including the risks, benefits and alternatives for the proposed anesthesia with the patient or authorized representative who has indicated his/her understanding and acceptance.     Plan Discussed with: CRNA and Surgeon  Anesthesia Plan Comments:         Anesthesia Quick Evaluation

## 2017-10-17 NOTE — Discharge Instructions (Signed)
Cryoablation, Care After This sheet gives you information about how to care for yourself after your procedure. Your health care provider may also give you more specific instructions. If you have problems or questions, contact your health care provider. What can I expect after the procedure? After the procedure, it is common to have:  Soreness around the treatment area.  Mild pain and swelling in the treatment area.  Follow these instructions at home: Treatment area care   Follow instructions from your health care provider about how to take care of your incision. Make sure you: ? Wash your hands with soap and water before you change your bandage (dressing). If soap and water are not available, use hand sanitizer. ? Change your dressing as told by your health care provider. ? Leave stitches (sutures) in place. They may need to stay in place for 2 weeks or longer.  Check your treatment area every day for signs of infection. Check for: ? More redness, swelling, or pain. ? More fluid or blood. ? Warmth. ? Pus or a bad smell.  Keep the treated area clean, dry, and covered with a dressing until it has healed. Clean the area with soap and water or as told by your health care provider.  You may shower if your health care provider approves. If your bandage gets wet, change it right away. Activity  Follow instructions from your health care provider about any activity limitations.  Do not drive for 24 hours if you received a medicine to help you relax (sedative). General instructions  Take over-the-counter and prescription medicines only as told by your health care provider.  Keep all follow-up visits as told by your health care provider. This is important. Contact a health care provider if:  You do not have a bowel movement for 2 days.  You have nausea or vomiting.  You have more redness, swelling, or pain around your treatment area.  You have more fluid or blood coming from your  treatment area.  Your treatment area feels warm to the touch.  You have pus or a bad smell coming from your treatment area.  You have a fever. Get help right away if:  You have severe pain.  You have trouble swallowing or breathing.  You have severe weakness or dizziness.  You have chest pain or shortness of breath.

## 2017-10-17 NOTE — Anesthesia Postprocedure Evaluation (Signed)
Anesthesia Post Note  Patient: Jason Contreras  Procedure(s) Performed: CT WITH ANESTHESIA RENAL CRYOABLATION (Right )     Patient location during evaluation: PACU Anesthesia Type: General Level of consciousness: awake and alert Pain management: pain level controlled Vital Signs Assessment: post-procedure vital signs reviewed and stable Respiratory status: spontaneous breathing, nonlabored ventilation, respiratory function stable and patient connected to nasal cannula oxygen Cardiovascular status: blood pressure returned to baseline and stable Postop Assessment: no apparent nausea or vomiting Anesthetic complications: no    Last Vitals:  Vitals:   10/17/17 1152 10/17/17 1240  BP: (!) 146/93 (!) 148/81  Pulse: (!) 112 (!) 109  Resp:  14  Temp: 36.6 C (!) 36.4 C  SpO2: 99% 100%    Last Pain:  Vitals:   10/17/17 1240  TempSrc:   PainSc: [place] DAVID

## 2017-10-17 NOTE — Anesthesia Procedure Notes (Signed)
Procedure Name: Intubation Date/Time: 10/17/2017 8:44 AM Performed by: Claudia Desanctis, CRNA Pre-anesthesia Checklist: Patient identified, Emergency Drugs available, Suction available and Patient being monitored Patient Re-evaluated:Patient Re-evaluated prior to induction Oxygen Delivery Method: Circle system utilized Preoxygenation: Pre-oxygenation with 100% oxygen Induction Type: IV induction Ventilation: Mask ventilation without difficulty Laryngoscope Size: Mac and 4 Grade View: Grade I Tube type: Oral Tube size: 7.5 mm Number of attempts: 1 Airway Equipment and Method: Stylet Placement Confirmation: ETT inserted through vocal cords under direct vision,  positive ETCO2 and breath sounds checked- equal and bilateral Secured at: 22 cm Tube secured with: Tape Dental Injury: Teeth and Oropharynx as per pre-operative assessment  Comments: Intubated by Roque Cash

## 2017-10-17 NOTE — Progress Notes (Signed)
Time out completed at 09:31 and agreement was reached with MD, CT tech, CRNA, RN present in the room.  Dr. Annamaria Boots has began the procedure while pt is under the care of CRNA team.

## 2017-10-17 NOTE — Procedures (Signed)
Rt renal mass  S/p CT bx and cryoablation  No comp EBL min Path pending Full report in pacs

## 2017-10-17 NOTE — Progress Notes (Signed)
Patient voided 100 ml freely, feels good, discharge instructions given.

## 2017-10-17 NOTE — Addendum Note (Signed)
Addendum  created 10/17/17 1341 by Claudia Desanctis, CRNA   Charge Capture section accepted

## 2017-10-20 ENCOUNTER — Encounter (HOSPITAL_COMMUNITY): Payer: Self-pay | Admitting: Interventional Radiology

## 2017-10-31 NOTE — Discharge Summary (Signed)
Physician Discharge Summary      Patient ID: Jason Contreras MRN: [id number] DOB/AGE: 1940-12-06 77 y.o.  Admit date: 10/17/2017 Discharge date: 10/31/2017  Admission Diagnoses: Active Problems:   Renal cell cancer, right [facility])  Discharge Diagnoses:  Active Problems:   Renal cell cancer, right [facility])    Procedures: Procedure(s): CT WITH ANESTHESIA RIGHT RENAL CRYOABLATION  Discharged Condition: good  Hospital Course: Admitted for observation on 6/14 after right renal cryoablation. Was determined to be stable for discharge later same day. Discharged home   Consults: None   Discharge Exam: Blood pressure (!) 160/80, pulse (!) 120, temperature 98 F (36.7 C), resp. rate 18, height 5\' 10"  (1.778 m), weight 225 lb (102.1 kg), SpO2 99 %. Lungs: CTA without w/r/r Heart: Regular Abdomen: soft    Disposition:   Discharge Instructions    Call MD for:  difficulty breathing, headache or visual disturbances   Complete by:  As directed    Call MD for:  persistant nausea and vomiting   Complete by:  As directed    Call MD for:  redness, tenderness, or signs of infection (pain, swelling, redness, odor or green/yellow discharge around incision site)   Complete by:  As directed    Call MD for:  severe uncontrolled pain   Complete by:  As directed    Call MD for:  temperature >100.4   Complete by:  As directed    Diet - low sodium heart healthy   Complete by:  As directed    Increase activity slowly   Complete by:  As directed    May shower / Bathe   Complete by:  As directed    Beginning 6/15   May walk up steps   Complete by:  As directed    Remove dressing in 24 hours   Complete by:  As directed      Allergies as of 10/17/2017      Reactions   Morphine And Related Nausea And Vomiting      Medication List    TAKE these medications   acetaminophen 325 MG tablet Commonly known as:  TYLENOL Take 650 mg by mouth daily as needed for headache.   atorvastatin 20  MG tablet Commonly known as:  LIPITOR Take 1 tablet (20 mg total) by mouth daily at 6 PM.   carvedilol 6.25 MG tablet Commonly known as:  COREG Take 1 tablet (6.25 mg total) by mouth 2 (two) times daily.   gabapentin 300 MG capsule Commonly known as:  NEURONTIN Take 1 capsule (300 mg total) by mouth 4 (four) times daily.   glimepiride 4 MG tablet Commonly known as:  AMARYL Take 0.5 tablets (2 mg total) by mouth daily.   lidocaine 5 % Commonly known as:  LIDODERM Place 1 patch onto the skin daily. Remove & Discard patch within 12 hours or as directed by MD What changed:    when to take this  reasons to take this  additional instructions   losartan 100 MG tablet Commonly known as:  COZAAR Take 1 tablet (100 mg total) by mouth daily.   metFORMIN 500 MG tablet Commonly known as:  GLUCOPHAGE Take 2 tablets (1,000 mg total) by mouth 2 (two) times daily.   mometasone 0.1 % cream Commonly known as:  ELOCON APPLY  CREAM TOPICALLY ONCE DAILY What changed:  See the new instructions.   pioglitazone 45 MG tablet Commonly known as:  ACTOS Take 1 tablet (45 mg total) by mouth daily.  tamsulosin 0.4 MG Caps capsule Commonly known as:  FLOMAX Take 1 capsule (0.4 mg total) by mouth daily.      Follow-up Information    [name], MD. Go in 1 month(s).   Specialties:  Interventional Radiology, Radiology Why:  Clinic will call you to schedule appointment Contact information: [address] [place] [address] [phone]           Signed: Ascencion Dike PA-C 10/31/2017, 4:26 PM

## 2017-11-04 ENCOUNTER — Ambulatory Visit (INDEPENDENT_AMBULATORY_CARE_PROVIDER_SITE_OTHER): Payer: Medicare Other | Admitting: Family Medicine

## 2017-11-04 ENCOUNTER — Encounter: Payer: Self-pay | Admitting: Family Medicine

## 2017-11-04 VITALS — BP 153/87 | HR 90 | Wt 225.0 lb

## 2017-11-04 DIAGNOSIS — M5137 Other intervertebral disc degeneration, lumbosacral region: Secondary | ICD-10-CM | POA: Diagnosis not present

## 2017-11-04 DIAGNOSIS — I10 Essential (primary) hypertension: Secondary | ICD-10-CM

## 2017-11-04 DIAGNOSIS — E0821 Diabetes mellitus due to underlying condition with diabetic nephropathy: Secondary | ICD-10-CM

## 2017-11-04 DIAGNOSIS — M47817 Spondylosis without myelopathy or radiculopathy, lumbosacral region: Secondary | ICD-10-CM | POA: Insufficient documentation

## 2017-11-04 DIAGNOSIS — C641 Malignant neoplasm of right kidney, except renal pelvis: Secondary | ICD-10-CM | POA: Diagnosis not present

## 2017-11-04 DIAGNOSIS — E785 Hyperlipidemia, unspecified: Secondary | ICD-10-CM

## 2017-11-04 NOTE — Assessment & Plan Note (Signed)
Poor control of blood pressure on review.  Will restart carvedilol at 6.25 twice a day observe for fatigue type symptoms. On review with patient feels fatigue-like symptoms are more related to back and/or hip.

## 2017-11-04 NOTE — Assessment & Plan Note (Signed)
Recovered well from biopsy procedure last month.

## 2017-11-04 NOTE — Progress Notes (Signed)
BP (!) 153/87   Pulse 90   Wt 225 lb (102.1 kg)   SpO2 90%   BMI 32.28 kg/m    Subjective:    Patient ID: Jason Contreras, male    DOB: 02-Dec-1940, 77 y.o.   MRN: [id number]  HPI: Jason Contreras is a 77 y.o. male  Chief Complaint  Patient presents with  . Follow-up  . Diabetes  . Renal cancer    Recently removed mass from R kidney   Patient follow-up has recovered well from surgery and biopsy of kidney cancer doing well with no issues. Patient does have issues related to back and/or hip arthritis.  Patient having difficulty walking moving and chronic pain. Has limitations with nonsteroidal anti-inflammatory drugs because of kidney.  Diabetes reviewed hospital notes with good control no low blood sugar spells. Patient also check blood pressure which is been elevated.  Patient with multiple elevated readings otherwise been doing well home blood pressure monitoring has been okay. Relevant past medical, surgical, family and social history reviewed and updated as indicated. Interim medical history since our last visit reviewed. Allergies and medications reviewed and updated.  Review of Systems  Constitutional: Negative.   Respiratory: Negative.   Cardiovascular: Negative.     Per HPI unless specifically indicated above     Objective:    BP (!) 153/87   Pulse 90   Wt 225 lb (102.1 kg)   SpO2 90%   BMI 32.28 kg/m   Wt Readings from Last 3 Encounters:  11/04/17 225 lb (102.1 kg)  10/17/17 225 lb (102.1 kg)  10/17/17 225 lb (102.1 kg)    Physical Exam  Constitutional: He is oriented to person, place, and time. He appears well-developed and well-nourished.  HENT:  Head: Normocephalic and atraumatic.  Eyes: Conjunctivae and EOM are normal.  Neck: Normal range of motion.  Cardiovascular: Normal rate, regular rhythm and normal heart sounds.  Pulmonary/Chest: Effort normal and breath sounds normal.  Musculoskeletal: Normal range of motion.  Neurological: He is alert and  oriented to person, place, and time.  Skin: No erythema.  Psychiatric: He has a normal mood and affect. His behavior is normal. Judgment and thought content normal.    Results for orders placed or performed during the hospital encounter of 10/17/17  Glucose, capillary  Result Value Ref Range   Glucose-Capillary 118 (H) 65 - 99 mg/dL   Comment 1 Notify RN    Comment 2 Document in Chart   Glucose, capillary  Result Value Ref Range   Glucose-Capillary 115 (H) 65 - 99 mg/dL   Comment 1 Notify RN    Comment 2 Document in Chart       Assessment & Plan:   Problem List Items Addressed This Visit      Cardiovascular and Mediastinum   Hypertension - Primary    Poor control of blood pressure on review.  Will restart carvedilol at 6.25 twice a day observe for fatigue type symptoms. On review with patient feels fatigue-like symptoms are more related to back and/or hip.        Endocrine   DM due to underlying condition with diabetic nephropathy ([place])    The current medical regimen is effective;  continue present plan and medications.         Musculoskeletal and Integument   DJD (degenerative joint disease), lumbosacral    Discussed degenerative joint disease care and treatment patient will continue with Tylenol use limitations on nonsteroidals. We will give handicap parking  sticker May be ready for referral to orthopedics at some point in the future.        Genitourinary   Adenocarcinoma, renal cell ([place])    Recovered well from biopsy procedure last month.        Other   Hyperlipidemia       Follow up plan: Return in about 3 months (around 02/04/2018) for Physical Exam.

## 2017-11-04 NOTE — Assessment & Plan Note (Signed)
The current medical regimen is effective;  continue present plan and medications.  

## 2017-11-04 NOTE — Assessment & Plan Note (Signed)
Discussed degenerative joint disease care and treatment patient will continue with Tylenol use limitations on nonsteroidals. We will give handicap parking sticker May be ready for referral to orthopedics at some point in the future.

## 2017-11-12 ENCOUNTER — Telehealth: Payer: Self-pay | Admitting: Radiology

## 2017-11-12 ENCOUNTER — Ambulatory Visit
Admission: RE | Admit: 2017-11-12 | Discharge: 2017-11-12 | Disposition: A | Discharge status: Home / Self Care | Payer: Medicare Other | Source: Ambulatory Visit | Attending: Radiology | Admitting: Radiology

## 2017-11-12 DIAGNOSIS — C641 Malignant neoplasm of right kidney, except renal pelvis: Secondary | ICD-10-CM | POA: Diagnosis not present

## 2017-11-12 DIAGNOSIS — N2889 Other specified disorders of kidney and ureter: Secondary | ICD-10-CM

## 2017-11-12 DIAGNOSIS — Z9889 Other specified postprocedural states: Secondary | ICD-10-CM | POA: Diagnosis not present

## 2017-11-12 HISTORY — PX: IR RADIOLOGIST EVAL & MGMT: IMG5224

## 2017-11-12 NOTE — Progress Notes (Signed)
Patient ID: Jason Contreras, male   DOB: January 08, 1941, 77 y.o.   MRN: [id number]       Chief Complaint:  Status post renal cryoablation  Referring Physician(s): [name]  History of Present Illness: Jason Contreras is a 77 y.o. male with a prior history of right papillary renal cell carcinoma status post right partial nephrectomy August 2017.  Surveillance imaging demonstrated a new posterior medial right renal cortical lesion measuring 1.9 cm.  MRI confirmed this lesion is solid and enhancing consistent with a renal cell carcinoma.  He underwent successful CT biopsy and cryoablation approximate 1 month ago at [facility].  He returns for outpatient follow-up.  He is now asymptomatic.  No current flank or abdominal pain.  No dysuria, hematuria or fevers.  Stable weight and appetite.  No physical limitation.  Overall he has recovered well.  Past Medical History:  Diagnosis Date  . Adenocarcinoma, renal cell ([place])   . Arthritis   . Diabetes mellitus without complication ([place])   . Elevated PSA   . Headache   . Hematuria   . Hyperlipidemia   . Hypertension   . Neuropathy   . Obesity   . PONV (postoperative nausea and vomiting)   . Renal insufficiency   . Right renal mass     Past Surgical History:  Procedure Laterality Date  . APPENDECTOMY    . IR RADIOLOGIST EVAL & MGMT  07/17/2017  . IR RADIOLOGIST EVAL & MGMT  08/13/2017  . RADIOLOGY WITH ANESTHESIA Right 10/17/2017   Procedure: CT WITH ANESTHESIA RENAL CRYOABLATION;  Surgeon: [name], MD;  Location: [facility] ORS;  Service: Anesthesiology;  Laterality: Right;  . ROBOTIC ASSITED PARTIAL NEPHRECTOMY Right 12/12/2015   Procedure: ROBOTIC ASSITED PARTIAL NEPHRECTOMY;  Surgeon: [name], MD;  Location: [facility] ORS;  Service: Urology;  Laterality: Right;    Allergies: Morphine and related  Medications: Prior to Admission medications   Medication Sig Start Date End Date Taking? Authorizing Provider  acetaminophen  (TYLENOL) 325 MG tablet Take 650 mg by mouth daily as needed for headache.    [provider]  atorvastatin (LIPITOR) 20 MG tablet Take 1 tablet (20 mg total) by mouth daily at 6 PM. 01/13/17   Crissman, Jeannette How, MD  carvedilol (COREG) 6.25 MG tablet Take 1 tablet (6.25 mg total) by mouth 2 (two) times daily. Patient not taking: Reported on 11/04/2017 04/22/17   [name], MD  gabapentin (NEURONTIN) 300 MG capsule Take 1 capsule (300 mg total) by mouth 4 (four) times daily. Patient taking differently: Take 300 mg by mouth 4 (four) times daily.  01/13/17   [name], MD  glimepiride (AMARYL) 4 MG tablet Take 0.5 tablets (2 mg total) by mouth daily. 01/13/17   [name], MD  lidocaine (LIDODERM) 5 % Place 1 patch onto the skin daily. Remove & Discard patch within 12 hours or as directed by MD Patient taking differently: Place 1 patch onto the skin daily as needed (pain). Remove & Discard patch within 12 hours or as directed by MD 01/13/17   [name], MD  losartan (COZAAR) 100 MG tablet Take 1 tablet (100 mg total) by mouth daily. 01/13/17   [name], MD  metFORMIN (GLUCOPHAGE) 500 MG tablet Take 2 tablets (1,000 mg total) by mouth 2 (two) times daily. 01/13/17   [name], MD  mometasone (ELOCON) 0.1 % cream APPLY  CREAM TOPICALLY ONCE DAILY Patient taking differently: APPLY  CREAM  TOPICALLY ONCE DAILY AS NEEDED FOR RASH 07/31/17   [name], MD  pioglitazone (ACTOS) 45 MG tablet Take 1 tablet (45 mg total) by mouth daily. 01/13/17   [name], MD  tamsulosin (FLOMAX) 0.4 MG CAPS capsule Take 1 capsule (0.4 mg total) by mouth daily. 04/22/17   [name], MD     Family History  Problem Relation Age of Onset  . Cancer Mother        BONE  . Stroke Brother   . Kidney disease Neg Hx     Social History   Socioeconomic History  . Marital status: Married    Spouse name: Not on file  . Number of children: Not on file  . Years of  education: Not on file  . Highest education level: Not on file  Occupational History  . Not on file  Social Needs  . Financial resource strain: Not on file  . Food insecurity:    Worry: Not on file    Inability: Not on file  . Transportation needs:    Medical: Not on file    Non-medical: Not on file  Tobacco Use  . Smoking status: Never Smoker  . Smokeless tobacco: Never Used  Substance and Sexual Activity  . Alcohol use: No  . Drug use: No  . Sexual activity: Not on file  Lifestyle  . Physical activity:    Days per week: Not on file    Minutes per session: Not on file  . Stress: Not on file  Relationships  . Social connections:    Talks on phone: Not on file    Gets together: Not on file    Attends religious service: Not on file    Active member of club or organization: Not on file    Attends meetings of clubs or organizations: Not on file    Relationship status: Not on file  Other Topics Concern  . Not on file  Social History Narrative  . Not on file    ECOG Status: 0 - Asymptomatic  Review of Systems: A 12 point ROS discussed and pertinent positives are indicated in the HPI above.  All other systems are negative.  Review of Systems  Vital Signs: BP (!) 167/72   Pulse 62   Temp 98.2 F (36.8 C) (Oral)   Resp 16   Ht 5\' 10"  (1.778 m)   Wt 226 lb (102.5 kg)   SpO2 98%   BMI 32.43 kg/m   Physical Exam  Constitutional: He is oriented to person, place, and time. He appears well-developed and well-nourished. No distress.  Eyes: Conjunctivae are normal. No scleral icterus.  Cardiovascular: Normal rate and regular rhythm.  Pulmonary/Chest: Effort normal and breath sounds normal.  Abdominal: Soft. Bowel sounds are normal. He exhibits distension.  Neurological: He is alert and oriented to person, place, and time.  Skin: Skin is warm and dry. No rash noted. He is not diaphoretic.  Psychiatric: He has a normal mood and affect.     Imaging: Ct Guide Tissue  Ablation  Result Date: 10/17/2017 INDICATION: Right renal cell carcinoma EXAM: CT-GUIDED PERCUTANEOUS CRYOABLATION OF RIGHT RENAL MASS CT-GUIDED CORE BIOPSY RIGHT RENAL MASS ANESTHESIA/SEDATION: General MEDICATIONS: ANCEF 2 G. The antibiotic was administered in an appropriate time interval prior to needle puncture of the skin. CONTRAST:  NONE. PROCEDURE: The procedure, risks, benefits, and alternatives were explained to the patient. Questions regarding the procedure were encouraged and answered. The patient understands and consents to  the procedure. The patient was placed under general anesthesia. Initial unenhanced CT was performed in a PRONE position to localize the 1.9 cm cortical right renal mass. The patient was prepped with ChloraPrep in a sterile fashion, and a sterile drape was applied covering the operative field. A sterile gown and sterile gloves were used for the procedure. Under CT guidance, a ice pearl 2.1 CX percutaneous cryoablation probe was advanced into the medial 1.9 cm cortical right renal mass. Probe positioning was confirmed by CT prior to cryoablation. Cryoablation needle was centered within the lesion. Next, a 11 gauge coaxial guide needle was advanced adjacent to the ablation needle. This needle was also confirmed along the edge of the lesion. A single 18 gauge core biopsy obtained and placed in formalin. Needle removed. Prior to ablation, the imaging was repeated. Needle adjustments were made to confirm ablation needle centered within the lesion. Cryoablation was performed through the single ice Pearl 2.1 CX. Initial 10 minute cycle of cryoablation was performed. This was followed by a 7 minute thaw cycle. A second 10 minute cycle of cryoablation was then performed. During ablation, periodic CT imaging was performed to monitor ice ball formation and morphology. After active thaw, the cryoablation probe was removed. Post-procedural CT was performed. COMPLICATIONS: None immediate. FINDINGS:  CT imaging confirms single probe centered within the 1.9 cm right renal mass. Imaging correlated with the prior MR and CT. Successful CT biopsy also performed as detailed above. Two cycle cryo ablation performed. Imaging during the ablation procedure confirms adequate " ice ball " coverage of the lesion. Following the lesion, cautery was performed. Needle removed. Postprocedure imaging demonstrates no significant hemorrhage or hematoma. No hydronephrosis. Patient tolerated the procedure well. IMPRESSION: CT guided 18 gauge core biopsy and percutaneous cryoablation of the 1.9 cm right renal cortical mass. The patient will be observed overnight. Initial follow-up will be performed in approximately 4 weeks. Electronically Signed   By: Jerilynn Mages.  Dailyn Kempner M.D.   On: 10/17/2017 11:40   Ct Biopsy  Result Date: 10/17/2017 INDICATION: Right renal cell carcinoma EXAM: CT-GUIDED PERCUTANEOUS CRYOABLATION OF RIGHT RENAL MASS CT-GUIDED CORE BIOPSY RIGHT RENAL MASS ANESTHESIA/SEDATION: General MEDICATIONS: ANCEF 2 G. The antibiotic was administered in an appropriate time interval prior to needle puncture of the skin. CONTRAST:  NONE. PROCEDURE: The procedure, risks, benefits, and alternatives were explained to the patient. Questions regarding the procedure were encouraged and answered. The patient understands and consents to the procedure. The patient was placed under general anesthesia. Initial unenhanced CT was performed in a PRONE position to localize the 1.9 cm cortical right renal mass. The patient was prepped with ChloraPrep in a sterile fashion, and a sterile drape was applied covering the operative field. A sterile gown and sterile gloves were used for the procedure. Under CT guidance, a ice pearl 2.1 CX percutaneous cryoablation probe was advanced into the medial 1.9 cm cortical right renal mass. Probe positioning was confirmed by CT prior to cryoablation. Cryoablation needle was centered within the lesion. Next, a 5 gauge  coaxial guide needle was advanced adjacent to the ablation needle. This needle was also confirmed along the edge of the lesion. A single 18 gauge core biopsy obtained and placed in formalin. Needle removed. Prior to ablation, the imaging was repeated. Needle adjustments were made to confirm ablation needle centered within the lesion. Cryoablation was performed through the single ice Pearl 2.1 CX. Initial 10 minute cycle of cryoablation was performed. This was followed by a 7 minute thaw cycle. A  second 10 minute cycle of cryoablation was then performed. During ablation, periodic CT imaging was performed to monitor ice ball formation and morphology. After active thaw, the cryoablation probe was removed. Post-procedural CT was performed. COMPLICATIONS: None immediate. FINDINGS: CT imaging confirms single probe centered within the 1.9 cm right renal mass. Imaging correlated with the prior MR and CT. Successful CT biopsy also performed as detailed above. Two cycle cryo ablation performed. Imaging during the ablation procedure confirms adequate " ice ball " coverage of the lesion. Following the lesion, cautery was performed. Needle removed. Postprocedure imaging demonstrates no significant hemorrhage or hematoma. No hydronephrosis. Patient tolerated the procedure well. IMPRESSION: CT guided 18 gauge core biopsy and percutaneous cryoablation of the 1.9 cm right renal cortical mass. The patient will be observed overnight. Initial follow-up will be performed in approximately 4 weeks. Electronically Signed   By: Jerilynn Mages.  Shylynn Bruning M.D.   On: 10/17/2017 11:40    Labs:  CBC: Recent Labs    01/13/17 1358 01/13/17 1406 10/14/17 1530  WBC 5.5 CANCELED 5.8  HGB 13.7 CANCELED 14.1  HCT 41.3 CANCELED 42.5  PLT 248 CANCELED 235    COAGS: Recent Labs    10/14/17 1530  INR 0.98    BMP: Recent Labs    01/13/17 1358 01/13/17 1406 06/19/17 1419 07/29/17 1406 10/14/17 1530  NA 141 CANCELED  --  143 143  K 5.2  CANCELED  --  4.9 4.9  CL 103 CANCELED  --  102 105  CO2 27 CANCELED  --  25 29  GLUCOSE 82 CANCELED  --  109* 103*  BUN 17 CANCELED  --  13 16  CALCIUM 9.5 CANCELED  --  9.6 9.4  CREATININE 1.16 CANCELED 1.00 0.92 1.08  GFRNONAA 62  --   --  82 >60  GFRAA 72  --   --  94 >60    LIVER FUNCTION TESTS: Recent Labs    01/13/17 1358 01/13/17 1406 07/29/17 1354  BILITOT 0.6 CANCELED  --   AST 16 CANCELED 21  ALT 11 CANCELED 22  ALKPHOS 55 CANCELED  --   PROT 6.5 CANCELED  --   ALBUMIN 4.1 CANCELED  --     TUMOR MARKERS: No results for input(s): AFPTM, CEA, CA199, CHROMGRNA in the last 8760 hours.  Assessment and Plan:  Doing well 1 month status post right renal cell carcinoma cryoablation.  No delayed complication.  Biopsy demonstrated clear cell renal cell carcinoma, nuclear grade type II.  These findings were discussed with the patient and his wife.  Plan: Repeat CT abdomen without and with contrast in 3 months (October 2019).  Imaging will be [facility] at [facility].  I will see him back in the office after that to review the scan.  Thank you for this interesting consult.  I greatly enjoyed meeting BREON REHM and look forward to participating in their care.  A copy of this report was sent to the requesting provider on this date.  Electronically Signed: [name] 11/12/2017, 2:59 PM   I spent a total of    25 Minutes in face to face in clinical consultation, greater than 50% of which was counseling/coordinating care for this patient with a right renal cell carcinoma

## 2017-11-13 ENCOUNTER — Encounter: Payer: Self-pay | Admitting: *Deleted

## 2017-12-02 NOTE — Telephone Encounter (Signed)
error 

## 2017-12-08 ENCOUNTER — Other Ambulatory Visit: Payer: Self-pay

## 2017-12-08 NOTE — Patient Outreach (Signed)
[facility]) Care Management  12/08/2017  Jason Contreras Jul 24, 1940 [id number]   Medication Adherence call to [name] spoke with patient he said he already pick up both medication Losartan 100 mg and Atorvastatin 20 mg. patient will be refer to the pharmacist [name]  ,he said some times he can not afford all his medication and his wife needs patient Assistance on one of her medications she is a [facility] patient too.. [name] is showing past due under [facility].   [name] Management Direct Dial [phone]  Fax [phone] [email or web address]

## 2017-12-15 ENCOUNTER — Other Ambulatory Visit: Payer: Self-pay | Admitting: Pharmacist

## 2017-12-15 ENCOUNTER — Ambulatory Visit: Payer: Self-pay | Admitting: Pharmacist

## 2017-12-15 NOTE — Patient Outreach (Signed)
[facility]) Care Management  12/15/2017  Jason Contreras February 27, 1941 [id number]   Unsuccessful call attempt #1 to [name] home phone. [facility] referral for medication assistance.  HIPAA complaint message left encouraging call back.   PLAN: -Unsuccessful outreach letter sent to patient  -I will follow up with patient in 3-4 business days unless call returned sooner  Regina Eck, PharmD, [place]  [phone]

## 2017-12-16 ENCOUNTER — Other Ambulatory Visit: Payer: Self-pay | Admitting: Pharmacist

## 2017-12-16 ENCOUNTER — Ambulatory Visit: Payer: Self-pay | Admitting: Pharmacist

## 2017-12-16 NOTE — Patient Outreach (Addendum)
[place] [facility]) Care Management  12/16/2017  Jason Contreras 06-27-1940 [id number]  Jason Contreras is a 74 yoM referred to [facility] for medication assistance via pharmacy technician compliance calls.  PMH significant for: HTN, DMT2, BPH, arthritis.  SUBJECTIVE: Successful call to Jason Contreras regarding medication assistance. HIPAA identifiers verified.  Patient agreeable to review medications & allergies telephonically.  Patient on all generic medications and states his cost is not that bad.  He refers to his wife's medication bills being high.  Patient does not have his total income available at this time, but [facility] Pharmacist will plan to follow up.  OBJECTIVE: Medications Reviewed Today    Reviewed by Lavera Guise, [facility] (Pharmacist) on 12/16/17 at 1027  Med List Status: <None>  Medication Order Taking? Sig Documenting Provider Last Dose Status Informant  acetaminophen (TYLENOL) 325 MG tablet [id number] Yes Take 650 mg by mouth daily as needed for headache. [provider] Taking Active Self  atorvastatin (LIPITOR) 20 MG tablet [id number] Yes Take 1 tablet (20 mg total) by mouth daily at 6 PM. [name], MD Taking Active Self  carvedilol (COREG) 6.25 MG tablet [id number] Yes Take 1 tablet (6.25 mg total) by mouth 2 (two) times daily. [name], MD Taking Active Self  gabapentin (NEURONTIN) 300 MG capsule [id number] Yes Take 1 capsule (300 mg total) by mouth 4 (four) times daily.  Patient taking differently:  Take 300 mg by mouth 4 (four) times daily.    [name], MD Taking Active Self  glimepiride (AMARYL) 4 MG tablet [id number] Yes Take 0.5 tablets (2 mg total) by mouth daily. [name], MD Taking Active Self  lidocaine (LIDODERM) 5 % [id number] Yes Place 1 patch onto the skin daily. Remove & Discard patch within 12 hours or as directed by MD  Patient taking differently:  Place 1 patch onto the skin daily as needed (pain). Remove & Discard patch  within 12 hours or as directed by MD   [name], MD Taking Active Self  losartan (COZAAR) 100 MG tablet [id number] Yes Take 1 tablet (100 mg total) by mouth daily. [name], MD Taking Active Self  metFORMIN (GLUCOPHAGE) 500 MG tablet [id number] Yes Take 2 tablets (1,000 mg total) by mouth 2 (two) times daily. [name], MD Taking Active Self  mometasone (ELOCON) 0.1 % cream [id number] Yes APPLY  CREAM TOPICALLY ONCE DAILY  Patient taking differently:  APPLY  CREAM TOPICALLY ONCE DAILY AS NEEDED FOR RASH   Crissman, Jeannette How, MD Taking Active Self  pioglitazone (ACTOS) 45 MG tablet [id number] Yes Take 1 tablet (45 mg total) by mouth daily. [name], MD Taking Active Self  tamsulosin (FLOMAX) 0.4 MG CAPS capsule [id number] Yes Take 1 capsule (0.4 mg total) by mouth daily. [name], MD Taking Active Self         ASSESSMENT:  Drugs sorted by system:  Neurologic/Psychologic: gabapentin  Cardiovascular: carvedilol, atorvastatin, losartan  Endocrine: pioglitazone, glimerpiride  Topical: lidocaine patch, mometasone cream  Miscellaneous: tamsulosin  MEDICATION ASSISTANCE -LIS/extra help application submitted -All of patient's medications are generic and do not offer patient assistance programs by the manufacturer  -Reviewed the following cost-saving options:              -switching medications to mail order pharmacy (would fill a 3 month supply & save 1 monthly copay for patient)             -utilizing [facility]'s $4  list   -potentially could switch to triamcinolone 0.1% if cheaper option  Patient respectfully declined all of the above options, however was grateful to have assistance submitting the extra help application  Plan:  [facility] pharmacist will follow up with patient in 7-10 business days to see if Extra help/LIS approval granted.   Regina Eck, PharmD, [place]  [phone]

## 2018-01-01 ENCOUNTER — Ambulatory Visit: Payer: Self-pay | Admitting: Pharmacist

## 2018-01-02 ENCOUNTER — Ambulatory Visit: Payer: Medicare Other

## 2018-01-07 ENCOUNTER — Ambulatory Visit: Payer: Self-pay | Admitting: Pharmacist

## 2018-01-08 ENCOUNTER — Other Ambulatory Visit: Payer: Self-pay | Admitting: Family Medicine

## 2018-01-08 ENCOUNTER — Ambulatory Visit (INDEPENDENT_AMBULATORY_CARE_PROVIDER_SITE_OTHER): Payer: Medicare Other

## 2018-01-08 VITALS — BP 138/72 | HR 66 | Temp 97.5°F | Resp 16 | Ht 69.0 in | Wt 227.6 lb

## 2018-01-08 DIAGNOSIS — Z0000 Encounter for general adult medical examination without abnormal findings: Secondary | ICD-10-CM

## 2018-01-08 DIAGNOSIS — I10 Essential (primary) hypertension: Secondary | ICD-10-CM

## 2018-01-08 DIAGNOSIS — E0821 Diabetes mellitus due to underlying condition with diabetic nephropathy: Secondary | ICD-10-CM

## 2018-01-08 DIAGNOSIS — E785 Hyperlipidemia, unspecified: Secondary | ICD-10-CM

## 2018-01-08 MED ORDER — PIOGLITAZONE HCL 45 MG PO TABS: 45.0000 mg | ORAL_TABLET | Freq: Every day | ORAL | 1 refills | Status: DC

## 2018-01-08 MED ORDER — TAMSULOSIN HCL 0.4 MG PO CAPS: 0.4000 mg | ORAL_CAPSULE | Freq: Every day | ORAL | 1 refills | Status: DC

## 2018-01-08 MED ORDER — LOSARTAN POTASSIUM 100 MG PO TABS: 100.0000 mg | ORAL_TABLET | Freq: Every day | ORAL | 1 refills | Status: DC

## 2018-01-08 MED ORDER — ATORVASTATIN CALCIUM 20 MG PO TABS: 20.0000 mg | ORAL_TABLET | Freq: Every day | ORAL | 1 refills | Status: DC

## 2018-01-08 MED ORDER — GLIMEPIRIDE 4 MG PO TABS: 2.0000 mg | ORAL_TABLET | Freq: Every day | ORAL | 1 refills | Status: DC

## 2018-01-08 MED ORDER — CARVEDILOL 6.25 MG PO TABS: 6.2500 mg | ORAL_TABLET | Freq: Two times a day (BID) | ORAL | 1 refills | Status: DC

## 2018-01-08 MED ORDER — METFORMIN HCL 500 MG PO TABS: 1000.0000 mg | ORAL_TABLET | Freq: Two times a day (BID) | ORAL | 1 refills | Status: DC

## 2018-01-08 MED ORDER — GABAPENTIN 300 MG PO CAPS
300.0000 mg | ORAL_CAPSULE | Freq: Four times a day (QID) | ORAL | 1 refills | Status: DC
Start: 2018-01-08 — End: 2018-03-12

## 2018-01-08 NOTE — Progress Notes (Signed)
Subjective:   Jason Contreras is a 77 y.o. male who presents for Medicare Annual/Subsequent preventive examination.  Review of Systems:   Cardiac Risk Factors include: hypertension;male gender;advanced age (>37men, >51 women);dyslipidemia;diabetes mellitus;obesity (BMI >30kg/m2)      Objective:    Vitals: BP 138/72 (BP Location: Left Arm, Patient Position: Sitting)   Pulse 66   Temp (!) 97.5 F (36.4 C) (Temporal)   Resp 16   Ht 5\' 9"  (1.753 m)   Wt 227 lb 9.6 oz (103.2 kg)   BMI 33.61 kg/m   Body mass index is 33.61 kg/m.  Advanced Directives 01/08/2018 10/17/2017 10/17/2017 10/14/2017 01/01/2017 12/12/2015 11/06/2015  Does Patient Have a Medical Advance Directive? Yes No No Yes Yes No No  Type of Paramedic of [place];Living will - - - [place];Living will - -  Copy of [place] in Chart? No - copy requested - - - No - copy requested - -  Would patient like information on creating a medical advance directive? - No - Patient declined No - Patient declined - - No - patient declined information No - patient declined information    Tobacco Social History   Tobacco Use  Smoking Status Never Smoker  Smokeless Tobacco Never Used     Counseling given: Not Answered   Clinical Intake:  Pre-visit preparation completed: Yes  Pain : 0-10 Pain Score: 5  Pain Type: Chronic pain Pain Location: Back Pain Orientation: Left, Right Pain Descriptors / Indicators: Aching Pain Onset: More than a month ago Pain Frequency: Constant     Nutritional Status: BMI > 30  Obese Nutritional Risks: None Diabetes: Yes CBG done?: No Did pt. bring in CBG monitor from home?: No  How often do you need to have someone help you when you read instructions, pamphlets, or other written materials from your doctor or pharmacy?: 1 - Never What is the last grade level you completed in school?: 3 years college  Interpreter Needed?:  No  Information entered by :: Tiffany Hill,LPN   Past Medical History:  Diagnosis Date  . Adenocarcinoma, renal cell ([place])   . Arthritis   . Diabetes mellitus without complication ([place])   . Elevated PSA   . Headache   . Hematuria   . Hyperlipidemia   . Hypertension   . Neuropathy   . Obesity   . PONV (postoperative nausea and vomiting)   . Renal insufficiency   . Right renal mass    Past Surgical History:  Procedure Laterality Date  . APPENDECTOMY    . CRYOABLATION  10/17/2017  . IR RADIOLOGIST EVAL & MGMT  07/17/2017  . IR RADIOLOGIST EVAL & MGMT  08/13/2017  . IR RADIOLOGIST EVAL & MGMT  11/12/2017  . RADIOLOGY WITH ANESTHESIA Right 10/17/2017   Procedure: CT WITH ANESTHESIA RENAL CRYOABLATION;  Surgeon: [name], MD;  Location: [facility];  Service: Anesthesiology;  Laterality: Right;  . ROBOTIC ASSITED PARTIAL NEPHRECTOMY Right 12/12/2015   Procedure: ROBOTIC ASSITED PARTIAL NEPHRECTOMY;  Surgeon: [name], MD;  Location: [facility] ORS;  Service: Urology;  Laterality: Right;   Family History  Problem Relation Age of Onset  . Bone cancer Mother   . Stroke Brother   . Kidney disease Neg Hx    Social History   Socioeconomic History  . Marital status: Married    Spouse name: Not on file  . Number of children: Not on file  . Years of education: 3 years college   .  Highest education level: Some college, no degree  Occupational History  . Not on file  Social Needs  . Financial resource strain: Not hard at all  . Food insecurity:    Worry: Never true    Inability: Never true  . Transportation needs:    Medical: No    Non-medical: No  Tobacco Use  . Smoking status: Never Smoker  . Smokeless tobacco: Never Used  Substance and Sexual Activity  . Alcohol use: No  . Drug use: No  . Sexual activity: Not on file  Lifestyle  . Physical activity:    Days per week: 0 days    Minutes per session: 0 min  . Stress: Not at all  Relationships  . Social connections:     Talks on phone: More than three times a week    Gets together: More than three times a week    Attends religious service: More than 4 times per year    Active member of club or organization: No    Attends meetings of clubs or organizations: Never    Relationship status: Married  Other Topics Concern  . Not on file  Social History Narrative  . Not on file    Outpatient Encounter Medications as of 01/08/2018  Medication Sig  . acetaminophen (TYLENOL) 325 MG tablet Take 650 mg by mouth daily as needed for headache.  [name] atorvastatin (LIPITOR) 20 MG tablet Take 1 tablet (20 mg total) by mouth daily at 6 PM.  . carvedilol (COREG) 6.25 MG tablet Take 1 tablet (6.25 mg total) by mouth 2 (two) times daily. (Patient taking differently: Take 6.25 mg by mouth 2 (two) times daily. Takes once a day at night)  . gabapentin (NEURONTIN) 300 MG capsule Take 1 capsule (300 mg total) by mouth 4 (four) times daily. (Patient taking differently: Take 300 mg by mouth 4 (four) times daily. )  . glimepiride (AMARYL) 4 MG tablet Take 0.5 tablets (2 mg total) by mouth daily.  [name] losartan (COZAAR) 100 MG tablet Take 1 tablet (100 mg total) by mouth daily.  . metFORMIN (GLUCOPHAGE) 500 MG tablet Take 2 tablets (1,000 mg total) by mouth 2 (two) times daily.  . pioglitazone (ACTOS) 45 MG tablet Take 1 tablet (45 mg total) by mouth daily.  . tamsulosin (FLOMAX) 0.4 MG CAPS capsule Take 1 capsule (0.4 mg total) by mouth daily.  [name] lidocaine (LIDODERM) 5 % Place 1 patch onto the skin daily. Remove & Discard patch within 12 hours or as directed by MD (Patient not taking: Reported on 01/08/2018)  . mometasone (ELOCON) 0.1 % cream APPLY  CREAM TOPICALLY ONCE DAILY (Patient not taking: Reported on 01/08/2018)   No facility-administered encounter medications on file as of 01/08/2018.     Activities of Daily Living In your present state of health, do you have any difficulty performing the following activities: 01/08/2018 10/17/2017   Hearing? N -  Vision? N -  Difficulty concentrating or making decisions? N -  Walking or climbing stairs? Y -  Comment hip pain  -  Dressing or bathing? N -  Doing errands, shopping? N N  Preparing Food and eating ? N -  Using the Toilet? N -  In the past six months, have you accidently leaked urine? N -  Do you have problems with loss of bowel control? N -  Managing your Medications? N -  Managing your Finances? N -  Housekeeping or managing your Housekeeping? N -  Some recent data  might be hidden    Patient Care Team: [name], MD as PCP - General (Family Medicine) [name], MD as Consulting Physician (Urology) Lavera Guise, [facility] as [facility] (Pharmacist) [name], MD as Consulting Physician (Interventional Radiology)   Assessment:   This is a routine wellness examination for [facility].  Exercise Activities and Dietary recommendations Current Exercise Habits: The patient does not participate in regular exercise at present, Exercise limited by: None identified  Goals    . Increase water intake     Recommend drinking at least 6-8 glasses of water a day       Fall Risk Fall Risk  01/08/2018 11/04/2017 01/01/2017 09/26/2016 06/25/2016  Falls in the past year? No No No No No  Number falls in past yr: - - - - -  Injury with Fall? - - - - -   Is the patient's home free of loose throw rugs in walkways, pet beds, electrical cords, etc?   yes      Grab bars in the bathroom? yes      Handrails on the stairs?   yes      Adequate lighting?   yes  Timed Get Up and Go Performed: Completed in 8 seconds with no use of assistive devices, steady gait. No intervention needed at this time.   Depression Screen PHQ 2/9 Scores 01/08/2018 11/04/2017 01/01/2017 12/27/2015  PHQ - 2 Score 0 0 0 0    Cognitive Function     6CIT Screen 01/08/2018 01/01/2017  What Year? 0 points 0 points  What month? 0 points 0 points  What time? 0 points 0 points   Count back from 20 0 points 0 points  Months in reverse 0 points 0 points  Repeat phrase 0 points 0 points  Total Score 0 0    Immunization History  Administered Date(s) Administered  . Influenza, High Dose Seasonal PF 03/25/2016, 01/13/2017  . Influenza,inj,Quad PF,6+ Mos 03/13/2015  . Influenza-Unspecified 04/18/2014  . Pneumococcal Conjugate-13 10/19/2013  . Pneumococcal Polysaccharide-23 09/03/1997, 02/26/2005  . Td 02/17/2008    Qualifies for Shingles Vaccine? Yes, discussed shingrix vaccine   Screening Tests Health Maintenance  Topic Date Due  . FOOT EXAM  06/26/2016  . OPHTHALMOLOGY EXAM  05/03/2017  . INFLUENZA VACCINE  12/04/2017  . TETANUS/TDAP  02/16/2018  . HEMOGLOBIN A1C  04/15/2018  . COLONOSCOPY  04/20/2018   Cancer Screenings: Lung: Low Dose CT Chest recommended if Age 29-80 years, 30 pack-year currently smoking OR have quit w/in 15years. Patient does not qualify. Colorectal: completed 02/19/2008  Additional Screenings:  Hepatitis C Screening:not indicated      Plan:    I have personally reviewed and addressed the Medicare Annual Wellness questionnaire and have noted the following in the patient's chart:  A. Medical and social history B. Use of alcohol, tobacco or illicit drugs  C. Current medications and supplements D. Functional ability and status E.  Nutritional status F.  Physical activity G. Advance directives H. List of other physicians I.  Hospitalizations, surgeries, and ER visits in previous 12 months J.  [place] such as hearing and vision if needed, cognitive and depression L. Referrals and appointments   In addition, I have reviewed and discussed with patient certain preventive protocols, quality metrics, and best practice recommendations. A written personalized care plan for preventive services as well as general preventive health recommendations were provided to patient.   Signed,  Tyler Aas, LPN Nurse Health  Advisor   Nurse Notes: due for diabetic eye exam- discussed, he will make an appt and have results faxed over when completed Due for diabetic foot exam- cpe on 11/7 with Dr.Crissman.    Requesting refills on all medications to [facility] - patient taking carvedilol once a day. In-basket sent   Requesting new glucometer - he checks his blood sugar once a week while on oral diabetic medications.

## 2018-01-08 NOTE — Patient Instructions (Addendum)
Jason Contreras , Thank you for taking time to come for your Medicare Wellness Visit. I appreciate your ongoing commitment to your health goals. Please review the following plan we discussed and let me know if I can assist you in the future.   Screening recommendations/referrals: Colonoscopy: completed 02/19/2008 Recommended yearly ophthalmology/optometry visit for glaucoma screening and checkup Recommended yearly dental visit for hygiene and checkup  Vaccinations: Influenza vaccine: due now - declined, will receive in November Pneumococcal vaccine: completed series Tdap vaccine: up to date Shingles vaccine: shingrix eligible, check with your insurance company for coverage   Advanced directives: Please bring a copy of your health care power of attorney and living will to the office at your convenience.  Conditions/risks identified: Recommend drinking at least 6-8 glasses of water a day  Next appointment: Follow up on 03/12/2018 at 1:00pmwith [place]. Follow up in one year for your annual wellness exam.   Preventive Care 65 Years and Older, Male Preventive care refers to lifestyle choices and visits with your health care provider that can promote health and wellness. What does preventive care include?  A yearly physical exam. This is also called an annual well check.  Dental exams once or twice a year.  Routine eye exams. Ask your health care provider how often you should have your eyes checked.  Personal lifestyle choices, including:  Daily care of your teeth and gums.  Regular physical activity.  Eating a healthy diet.  Avoiding tobacco and drug use.  Limiting alcohol use.  Practicing safe sex.  Taking low doses of aspirin every day.  Taking vitamin and mineral supplements as recommended by your health care provider. What happens during an annual well check? The services and screenings done by your health care provider during your annual well check will depend on your  age, overall health, lifestyle risk factors, and family history of disease. Counseling  Your health care provider may ask you questions about your:  Alcohol use.  Tobacco use.  Drug use.  Emotional well-being.  Home and relationship well-being.  Sexual activity.  Eating habits.  History of falls.  Memory and ability to understand (cognition).  Work and work Statistician. Screening  You may have the following tests or measurements:  Height, weight, and BMI.  Blood pressure.  Lipid and cholesterol levels. These may be checked every 5 years, or more frequently if you are over 75 years old.  Skin check.  Lung cancer screening. You may have this screening every year starting at age 75 if you have a 30-pack-year history of smoking and currently smoke or have quit within the past 15 years.  Fecal occult blood test (FOBT) of the stool. You may have this test every year starting at age 75.  Flexible sigmoidoscopy or colonoscopy. You may have a sigmoidoscopy every 5 years or a colonoscopy every 10 years starting at age 75.  Prostate cancer screening. Recommendations will vary depending on your family history and other risks.  Hepatitis C blood test.  Hepatitis B blood test.  Sexually transmitted disease (STD) testing.  Diabetes screening. This is done by checking your blood sugar (glucose) after you have not eaten for a while (fasting). You may have this done every 1-3 years.  Abdominal aortic aneurysm (AAA) screening. You may need this if you are a current or former smoker.  Osteoporosis. You may be screened starting at age 75 if you are at high risk. Talk with your health care provider about your test results, treatment options, and  if necessary, the need for more tests. Vaccines  Your health care provider may recommend certain vaccines, such as:  Influenza vaccine. This is recommended every year.  Tetanus, diphtheria, and acellular pertussis (Tdap, Td) vaccine. You  may need a Td booster every 10 years.  Zoster vaccine. You may need this after age 60.  Pneumococcal 13-valent conjugate (PCV13) vaccine. One dose is recommended after age 26.  Pneumococcal polysaccharide (PPSV23) vaccine. One dose is recommended after age 51. Talk to your health care provider about which screenings and vaccines you need and how often you need them. This information is not intended to replace advice given to you by your health care provider. Make sure you discuss any questions you have with your health care provider. Document Released: 05/19/2015 Document Revised: 01/10/2016 Document Reviewed: 02/21/2015 Elsevier Interactive Patient Education  2017 [place] Prevention in the Home Falls can cause injuries. They can happen to people of all ages. There are many things you can do to make your home safe and to help prevent falls. What can I do on the outside of my home?  Regularly fix the edges of walkways and driveways and fix any cracks.  Remove anything that might make you trip as you walk through a door, such as a raised step or threshold.  Trim any bushes or trees on the path to your home.  Use bright outdoor lighting.  Clear any walking paths of anything that might make someone trip, such as rocks or tools.  Regularly check to see if handrails are loose or broken. Make sure that both sides of any steps have handrails.  Any raised decks and porches should have guardrails on the edges.  Have any leaves, snow, or ice cleared regularly.  Use sand or salt on walking paths during winter.  Clean up any spills in your garage right away. This includes oil or grease spills. What can I do in the bathroom?  Use night lights.  Install grab bars by the toilet and in the tub and shower. Do not use towel bars as grab bars.  Use non-skid mats or decals in the tub or shower.  If you need to sit down in the shower, use a plastic, non-slip stool.  Keep the floor  dry. Clean up any water that spills on the floor as soon as it happens.  Remove soap buildup in the tub or shower regularly.  Attach bath mats securely with double-sided non-slip rug tape.  Do not have throw rugs and other things on the floor that can make you trip. What can I do in the bedroom?  Use night lights.  Make sure that you have a light by your bed that is easy to reach.  Do not use any sheets or blankets that are too big for your bed. They should not hang down onto the floor.  Have a firm chair that has side arms. You can use this for support while you get dressed.  Do not have throw rugs and other things on the floor that can make you trip. What can I do in the kitchen?  Clean up any spills right away.  Avoid walking on wet floors.  Keep items that you use a lot in easy-to-reach places.  If you need to reach something above you, use a strong step stool that has a grab bar.  Keep electrical cords out of the way.  Do not use floor polish or wax that makes floors slippery. If you  must use wax, use non-skid floor wax.  Do not have throw rugs and other things on the floor that can make you trip. What can I do with my stairs?  Do not leave any items on the stairs.  Make sure that there are handrails on both sides of the stairs and use them. Fix handrails that are broken or loose. Make sure that handrails are as long as the stairways.  Check any carpeting to make sure that it is firmly attached to the stairs. Fix any carpet that is loose or worn.  Avoid having throw rugs at the top or bottom of the stairs. If you do have throw rugs, attach them to the floor with carpet tape.  Make sure that you have a light switch at the top of the stairs and the bottom of the stairs. If you do not have them, ask someone to add them for you. What else can I do to help prevent falls?  Wear shoes that:  Do not have high heels.  Have rubber bottoms.  Are comfortable and fit you  well.  Are closed at the toe. Do not wear sandals.  If you use a stepladder:  Make sure that it is fully opened. Do not climb a closed stepladder.  Make sure that both sides of the stepladder are locked into place.  Ask someone to hold it for you, if possible.  Clearly mark and make sure that you can see:  Any grab bars or handrails.  First and last steps.  Where the edge of each step is.  Use tools that help you move around (mobility aids) if they are needed. These include:  Canes.  Walkers.  Scooters.  Crutches.  Turn on the lights when you go into a dark area. Replace any light bulbs as soon as they burn out.  Set up your furniture so you have a clear path. Avoid moving your furniture around.  If any of your floors are uneven, fix them.  If there are any pets around you, be aware of where they are.  Review your medicines with your doctor. Some medicines can make you feel dizzy. This can increase your chance of falling. Ask your doctor what other things that you can do to help prevent falls. This information is not intended to replace advice given to you by your health care provider. Make sure you discuss any questions you have with your health care provider. Document Released: 02/16/2009 Document Revised: 09/28/2015 Document Reviewed: 05/27/2014 Elsevier Interactive Patient Education  2017 [employer].

## 2018-01-12 ENCOUNTER — Ambulatory Visit: Payer: Medicare Other | Admitting: Urology

## 2018-01-12 ENCOUNTER — Ambulatory Visit: Payer: Self-pay | Admitting: Pharmacist

## 2018-01-12 ENCOUNTER — Encounter: Payer: Self-pay | Admitting: Urology

## 2018-01-12 VITALS — BP 152/79 | HR 73 | Ht 70.0 in | Wt 229.0 lb

## 2018-01-12 DIAGNOSIS — N401 Enlarged prostate with lower urinary tract symptoms: Secondary | ICD-10-CM | POA: Diagnosis not present

## 2018-01-12 DIAGNOSIS — D3001 Benign neoplasm of right kidney: Secondary | ICD-10-CM | POA: Diagnosis not present

## 2018-01-12 DIAGNOSIS — N138 Other obstructive and reflux uropathy: Secondary | ICD-10-CM | POA: Diagnosis not present

## 2018-01-12 DIAGNOSIS — R972 Elevated prostate specific antigen [PSA]: Secondary | ICD-10-CM | POA: Diagnosis not present

## 2018-01-12 DIAGNOSIS — N2889 Other specified disorders of kidney and ureter: Secondary | ICD-10-CM | POA: Diagnosis not present

## 2018-01-12 NOTE — Progress Notes (Signed)
07/10/2017   Jason Contreras 01/17/1941 [id number]  Referring provider: [name], MD [address] [place], [place] [address]  Chief Complaint  Patient presents with  . Elevated PSA    HPI: Mr. Cozzens is a 77 year old male with a history of elevated PSA s/p recent negative prostate biopsy, a right renal mass s/p right partial nephrectomy 12/2015 who returns today for routine annual follow up.   Elevated PSA Patient was found to have a PSA of 4.5 ng/mL on 10/19/2013. A 4K score conducted on 12/15/2013 noted a 22% probability of having a Gleason's 7 or higher prostate cancer. PSA was 3.9 ng/mL on 12/29/2014, 5.8 on 06/26/15 and 6.0 on 06/30/15.  Repeat PSA on 09/29/15 5.6. He is found to have an enlarged prostate on DRE, but no nodules have been palpated.    He has a family history of PCa, with father having been diagnosed with PCa with metastatic diease in his 41s.    S/p negative biopsy on 10/09/15 which had 2 foci of HG PIN, otherwise no malignancy.  TRUS volume 41 cc.    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    Follow-up CT abdomen and pelvis as well as chest x-ray negative today at 6 month interval.  Most recent imaging on 06/19/2017 shows no local recurrence of the bed of his previous renal mass on the rise.  There is however, a new lesion measuring 2.0 x 1.7 cm which in retrospect it was in fact present from a year ago previously measuring 1.4 x 1.2.  It is quite occult in nature.  It was negative for any evidence of metastatic disease.  Underwent cryoablation and bx of the lesion on 10/17/2017.  Pathology was positive for  clear cell renal cell carcinoma, nuclear grade type II.   IR is planning a follow up CT in 02/2018.    BPH WITH LUTS No significant rinary complaints today.  Doing well on Flomax.  Main complaints are  frequency and nocturia.  Patient denies any gross hematuria, dysuria or suprapubic/flank pain.  Patient denies any fevers, chills, nausea or vomiting.   IPSS    Row Name 01/12/18 1300         International Prostate Symptom Score   How often have you had the sensation of not emptying your bladder?  Less than half the time     How often have you had to urinate less than every two hours?  Less than 1 in 5 times     How often have you found you stopped and started again several times when you urinated?  Less than 1 in 5 times     How often have you found it difficult to postpone urination?  Not at All     How often have you had a weak urinary stream?  Less than half the time     How often have you had to strain to start urination?  Less than half the time     How many times did you typically get up at night to urinate?  3 Times     Total IPSS Score  11       Quality of Life due to urinary symptoms   If you were to spend the rest of your life with your urinary condition just the way it is  now how would you feel about that?  Mostly Satisfied        Score:  1-7 Mild 8-19 Moderate 20-35 Severe   PMH: Past Medical History:  Diagnosis Date  . Adenocarcinoma, renal cell ([place])   . Arthritis   . Diabetes mellitus without complication ([place])   . Elevated PSA   . Headache   . Hematuria   . Hyperlipidemia   . Hypertension   . Neuropathy   . Obesity   . PONV (postoperative nausea and vomiting)   . Renal insufficiency   . Right renal mass     Surgical History: Past Surgical History:  Procedure Laterality Date  . APPENDECTOMY    . CRYOABLATION  10/17/2017  . IR RADIOLOGIST EVAL & MGMT  07/17/2017  . IR RADIOLOGIST EVAL & MGMT  08/13/2017  . IR RADIOLOGIST EVAL & MGMT  11/12/2017  . RADIOLOGY WITH ANESTHESIA Right 10/17/2017   Procedure: CT WITH ANESTHESIA RENAL CRYOABLATION;  Surgeon: [name], MD;  Location: [facility];  Service: Anesthesiology;  Laterality: Right;  . ROBOTIC  ASSITED PARTIAL NEPHRECTOMY Right 12/12/2015   Procedure: ROBOTIC ASSITED PARTIAL NEPHRECTOMY;  Surgeon: [name], MD;  Location: [facility] OR[facility];  Service: Urology;  Laterality: Right;    Home Medications:  Allergies as of 01/12/2018      Reactions   Morphine And Related Nausea And Vomiting      Medication List        Accurate as of 01/12/18  2:12 PM. Always use your most recent med list.          acetaminophen 325 MG tablet Commonly known as:  TYLENOL Take 650 mg by mouth daily as needed for headache.   atorvastatin 20 MG tablet Commonly known as:  LIPITOR Take 1 tablet (20 mg total) by mouth daily at 6 PM.   carvedilol 6.25 MG tablet Commonly known as:  COREG Take 1 tablet (6.25 mg total) by mouth 2 (two) times daily.   gabapentin 300 MG capsule Commonly known as:  NEURONTIN Take 1 capsule (300 mg total) by mouth 4 (four) times daily.   glimepiride 4 MG tablet Commonly known as:  AMARYL Take 0.5 tablets (2 mg total) by mouth daily.   lidocaine 5 % Commonly known as:  LIDODERM Place 1 patch onto the skin daily. Remove & Discard patch within 12 hours or as directed by MD   losartan 100 MG tablet Commonly known as:  COZAAR Take 1 tablet (100 mg total) by mouth daily.   metFORMIN 500 MG tablet Commonly known as:  GLUCOPHAGE Take 2 tablets (1,000 mg total) by mouth 2 (two) times daily.   mometasone 0.1 % cream Commonly known as:  ELOCON APPLY  CREAM TOPICALLY ONCE DAILY   pioglitazone 45 MG tablet Commonly known as:  ACTOS Take 1 tablet (45 mg total) by mouth daily.   tamsulosin 0.4 MG Caps capsule Commonly known as:  FLOMAX Take 1 capsule (0.4 mg total) by mouth daily.       Allergies:  Allergies  Allergen Reactions  . Morphine And Related Nausea And Vomiting    Family History: Family History  Problem Relation Age of Onset  . Bone cancer Mother   . Stroke Brother   . Kidney disease Neg Hx     Social History:  reports that he has never smoked. He  has never used smokeless tobacco. He reports that he does not drink alcohol or use drugs.   Physical Exam: BP (!) 152/79  Pulse 73   Ht 5\' 10"  (1.778 m)   Wt 229 lb (103.9 kg)   BMI 32.86 kg/m   Constitutional: Well nourished. Alert and oriented, No acute distress. HEENT: [place] AT, moist mucus membranes. Trachea midline, no masses. Cardiovascular: No clubbing, cyanosis, or edema. Respiratory: Normal respiratory effort, no increased work of breathing. GI: Abdomen is soft, non tender, non distended, no abdominal masses. Liver and spleen not palpable.  No hernias appreciated.  Stool sample for occult testing is not indicated.   GU: No CVA tenderness.  No bladder fullness or masses.  Patient with uncircumcised phallus.  Foreskin easily retracted  Urethral meatus is patent.  No penile discharge. No penile lesions or rashes. Scrotum without lesions, cysts, rashes and/or edema.  Testicles are located scrotally bilaterally. No masses are appreciated in the testicles. Left and right epididymis are normal. Rectal: Patient with  normal sphincter tone. Anus and perineum without scarring or rashes. No rectal masses are appreciated. Prostate is approximately 60 grams, could only palpated apex and midportion, no nodules are appreciated.   Skin: No rashes, bruises or suspicious lesions. Lymph: No cervical or inguinal adenopathy. Neurologic: Grossly intact, no focal deficits, moving all 4 extremities. Psychiatric: Normal mood and affect.   Laboratory Data: Lab Results  Component Value Date   WBC 5.8 10/14/2017   HGB 14.1 10/14/2017   HCT 42.5 10/14/2017   MCV 97.5 10/14/2017   PLT 235 10/14/2017    Lab Results  Component Value Date   CREATININE 1.08 10/14/2017    PSA history:      4.5 ng/dL on 10/19/2013.      4K score of 22% on 12/15/2013      3.9 ng/mL on 06/28/2014      3.9 ng/mL on 12/29/2014      5.8 ng/Dl on 06/26/15       6.0 ng/dL on 06/30/15       5.6 ng/dL on 09/29/15      5.1 on  07/2016      4.8 on 01/2017      6.8 in 07/2017 I have reviewed the labs.      Assessment & Plan:    1. Right renal mass:     pT1a RCC s/p right robotic partial nephrectomy on 12/12/15 and s/p cryoablation and bx in 10/2017 Follow up imaging planned for 02/2018   2. Elevated PSA:      History of elevated up to PSA 5.6 s/p negative biopsy 10/2015 (HgPIN) Most recent PSA 6.8  07/2017 PSA drawn today Explained that any prostate cancer that was discovered at his age would be treated in a palliative nature  3. BPH (benign prostatic hyperplasia) with LUTS:    Continue flomax  Return for pending PSA results .   Zara Council, PA-C  [facility] [address], [place], [place] [address] [phone]

## 2018-01-13 LAB — PSA: Prostate Specific Ag, Serum: 5.3 ng/mL — ABNORMAL HIGH (ref 0.0–4.0)

## 2018-01-14 ENCOUNTER — Other Ambulatory Visit: Payer: Self-pay | Admitting: Pharmacist

## 2018-01-14 ENCOUNTER — Ambulatory Visit: Payer: Self-pay | Admitting: Pharmacist

## 2018-01-14 NOTE — Patient Outreach (Signed)
[facility]) Care Management  01/14/2018  Jason Contreras 12-10-1940 [id number]   Successful patient outreach call to Jason Contreras today with HIPAA identifiers verified.  Call was to notify patient of Full Extra Help approval.  Patient very pleased with [facility] services and very appreciative.  Encouraged him to call back if any future issues arise.  PLAN: -Pharmacy to sign off case as goals have been met -Case closure letter routed to PCP -I am happy to assist in the future as needed  Regina Eck, PharmD, [place]  [phone]

## 2018-02-02 ENCOUNTER — Other Ambulatory Visit (HOSPITAL_COMMUNITY): Payer: Self-pay | Admitting: Interventional Radiology

## 2018-02-02 ENCOUNTER — Other Ambulatory Visit: Payer: Self-pay | Admitting: Radiology

## 2018-02-02 DIAGNOSIS — C641 Malignant neoplasm of right kidney, except renal pelvis: Secondary | ICD-10-CM

## 2018-02-16 ENCOUNTER — Ambulatory Visit: Payer: Medicare Other | Admitting: Urology

## 2018-02-16 ENCOUNTER — Encounter: Payer: Self-pay | Admitting: Urology

## 2018-02-16 VITALS — BP 174/93 | Ht 70.0 in | Wt 226.1 lb

## 2018-02-16 DIAGNOSIS — N138 Other obstructive and reflux uropathy: Secondary | ICD-10-CM

## 2018-02-16 DIAGNOSIS — R109 Unspecified abdominal pain: Secondary | ICD-10-CM

## 2018-02-16 DIAGNOSIS — C641 Malignant neoplasm of right kidney, except renal pelvis: Secondary | ICD-10-CM

## 2018-02-16 DIAGNOSIS — N401 Enlarged prostate with lower urinary tract symptoms: Secondary | ICD-10-CM

## 2018-02-16 DIAGNOSIS — D3001 Benign neoplasm of right kidney: Secondary | ICD-10-CM | POA: Diagnosis not present

## 2018-02-16 DIAGNOSIS — R972 Elevated prostate specific antigen [PSA]: Secondary | ICD-10-CM

## 2018-02-16 LAB — MICROSCOPIC EXAMINATION
EPITHELIAL CELLS (NON RENAL): NONE SEEN /HPF (ref 0–10)
WBC, UA: NONE SEEN /hpf (ref 0–5)

## 2018-02-16 LAB — URINALYSIS, COMPLETE
BILIRUBIN UA: NEGATIVE
Glucose, UA: NEGATIVE
Ketones, UA: NEGATIVE
LEUKOCYTES UA: NEGATIVE
Nitrite, UA: NEGATIVE
PH UA: 5.5 (ref 5.0–7.5)
Protein, UA: NEGATIVE
Specific Gravity, UA: 1.02 (ref 1.005–1.030)
Urobilinogen, Ur: 0.2 mg/dL (ref 0.2–1.0)

## 2018-02-16 NOTE — Progress Notes (Signed)
07/10/2017   Jason Contreras 1941-02-27 [id number]  Referring provider: [name], MD [address] [place], [place] [address]  Chief Complaint  Patient presents with  . Flank Pain    HPI: Jason Contreras is a 77 year old male with a history of elevated PSA s/p recent negative prostate biopsy, a right renal mass s/p right partial nephrectomy 12/2015 who returns today with the complaint of right flank pain.    He states that the pain has been in his right flank since his robotic partial nephrectomy in 12/2015.  He states the pain was manageable, but he states the pain has worsen since his last cryoablation in 10/2017.   He states two weeks ago, the pain was so severe that he was in tears.  He woke up with the pain and it continued to worsen.  He was anorexic that night.  Twisting make the pain worse.  Urinating eases the pain.  Patient denies any gross hematuria, dysuria or suprapubic/flank pain.  Patient denies any fevers, chills, nausea or vomiting.  His UA is bland.  His PVR is 127 mL.    Elevated PSA Patient was found to have a PSA of 4.5 ng/mL on 10/19/2013. A 4K score conducted on 12/15/2013 noted a 22% probability of having a Gleason's 7 or higher prostate cancer. PSA was 3.9 ng/mL on 12/29/2014, 5.8 on 06/26/15 and 6.0 on 06/30/15.  Repeat PSA on 09/29/15 5.6. He is found to have an enlarged prostate on DRE, but no nodules have been palpated.    He has a family history of PCa, with father having been diagnosed with PCa with metastatic diease in his 71s.    S/p negative biopsy on 10/09/15 which had 2 foci of HG PIN, otherwise no malignancy.  TRUS volume 41 cc.    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    Most recent imaging on 06/19/2017 shows no local recurrence of the bed of his previous renal mass on the rise.  There is however,  a new lesion measuring 2.0 x 1.7 cm which in retrospect it was in fact present from a year ago previously measuring 1.4 x 1.2.  It is quite occult in nature.  It was negative for any evidence of metastatic disease.  Underwent cryoablation and bx of the lesion on 10/17/2017.  Pathology was positive for  clear cell renal cell carcinoma, nuclear grade type II.   IR is planning a follow up CT in 02/2018.     PMH: Past Medical History:  Diagnosis Date  . Adenocarcinoma, renal cell ([place])   . Arthritis   . Diabetes mellitus without complication ([place])   . Elevated PSA   . Headache   . Hematuria   . Hyperlipidemia   . Hypertension   . Neuropathy   . Obesity   . PONV (postoperative nausea and vomiting)   . Renal insufficiency   . Right renal mass     Surgical History: Past Surgical History:  Procedure Laterality Date  . APPENDECTOMY    . CRYOABLATION  10/17/2017  . IR RADIOLOGIST EVAL & MGMT  07/17/2017  . IR RADIOLOGIST EVAL & MGMT  08/13/2017  . IR RADIOLOGIST EVAL & MGMT  11/12/2017  . RADIOLOGY WITH ANESTHESIA Right 10/17/2017   Procedure: CT WITH ANESTHESIA RENAL CRYOABLATION;  Surgeon: [name], MD;  Location:  [facility];  Service: Anesthesiology;  Laterality: Right;  . ROBOTIC ASSITED PARTIAL NEPHRECTOMY Right 12/12/2015   Procedure: ROBOTIC ASSITED PARTIAL NEPHRECTOMY;  Surgeon: [name], MD;  Location: [facility] ORS;  Service: Urology;  Laterality: Right;    Home Medications:  Allergies as of 02/16/2018      Reactions   Morphine And Related Nausea And Vomiting      Medication List        Accurate as of 02/16/18 11:59 PM. Always use your most recent med list.          acetaminophen 325 MG tablet Commonly known as:  TYLENOL Take 650 mg by mouth daily as needed for headache.   atorvastatin 20 MG tablet Commonly known as:  LIPITOR Take 1 tablet (20 mg total) by mouth daily at 6 PM.   carvedilol 6.25 MG tablet Commonly known as:  COREG Take 1 tablet (6.25 mg total)  by mouth 2 (two) times daily.   gabapentin 300 MG capsule Commonly known as:  NEURONTIN Take 1 capsule (300 mg total) by mouth 4 (four) times daily.   glimepiride 4 MG tablet Commonly known as:  AMARYL Take 0.5 tablets (2 mg total) by mouth daily.   lidocaine 5 % Commonly known as:  LIDODERM Place 1 patch onto the skin daily. Remove & Discard patch within 12 hours or as directed by MD   losartan 100 MG tablet Commonly known as:  COZAAR Take 1 tablet (100 mg total) by mouth daily.   metFORMIN 500 MG tablet Commonly known as:  GLUCOPHAGE Take 2 tablets (1,000 mg total) by mouth 2 (two) times daily.   mometasone 0.1 % cream Commonly known as:  ELOCON APPLY  CREAM TOPICALLY ONCE DAILY   pioglitazone 45 MG tablet Commonly known as:  ACTOS Take 1 tablet (45 mg total) by mouth daily.   tamsulosin 0.4 MG Caps capsule Commonly known as:  FLOMAX Take 1 capsule (0.4 mg total) by mouth daily.       Allergies:  Allergies  Allergen Reactions  . Morphine And Related Nausea And Vomiting    Family History: Family History  Problem Relation Age of Onset  . Bone cancer Mother   . Stroke Brother   . Kidney disease Neg Hx     Social History:  reports that he has never smoked. He has never used smokeless tobacco. He reports that he does not drink alcohol or use drugs.   Physical Exam: BP (!) 174/93 (BP Location: Left Arm, Patient Position: Sitting, Cuff Size: Normal)   Ht 5\' 10"  (1.778 m)   Wt 226 lb 1.6 oz (102.6 kg)   BMI 32.44 kg/m   Constitutional: Well nourished. Alert and oriented, No acute distress. HEENT: [place] AT, moist mucus membranes. Trachea midline, no masses. Cardiovascular: No clubbing, cyanosis, or edema. Respiratory: Normal respiratory effort, no increased work of breathing. GI: Abdomen is soft, non tender, non distended, no abdominal masses. Liver and spleen not palpable.  No hernias appreciated.  Stool sample for occult testing is not indicated.   GU: No  CVA tenderness.  No bladder fullness or masses.  Patient with uncircumcised phallus.  Foreskin easily retracted  Urethral meatus is patent.  No penile discharge. No penile lesions or rashes. Scrotum without lesions, cysts, rashes and/or edema.  Testicles are located scrotally bilaterally. No masses are appreciated in the testicles. Left and right epididymis are normal. Rectal: Patient with  normal sphincter tone. Anus and perineum without scarring or rashes. No rectal masses are  appreciated. Prostate is approximately 60 grams, could only palpated apex and midportion, no nodules are appreciated.   Skin: No rashes, bruises or suspicious lesions. Lymph: No cervical or inguinal adenopathy. Neurologic: Grossly intact, no focal deficits, moving all 4 extremities. Psychiatric: Normal mood and affect.   Laboratory Data: Lab Results  Component Value Date   WBC 5.8 10/14/2017   HGB 14.1 10/14/2017   HCT 42.5 10/14/2017   MCV 97.5 10/14/2017   PLT 235 10/14/2017    Lab Results  Component Value Date   CREATININE 0.97 02/16/2018    PSA history:      4.5 ng/dL on 10/19/2013.      4K score of 22% on 12/15/2013      3.9 ng/mL on 06/28/2014      3.9 ng/mL on 12/29/2014      5.8 ng/Dl on 06/26/15       6.0 ng/dL on 06/30/15       5.6 ng/dL on 09/29/15      5.1 on 07/2016      4.8 on 01/2017      6.8 in 07/2017      5.3 in 01/2018 I have reviewed the labs.  Pertinent imaging Results for JUSTUS, DROKE (MRN [id number]) as of 03/01/2018 19:44  Ref. Range 03/01/2018 19:44  Scan Result Unknown 127 mL        Assessment & Plan:    1. Right flank pain UA is bland, but will send for culture to rule out indolent infections CT is pending at this time  2. Right renal mass:     pT1a RCC s/p right robotic partial nephrectomy on 12/12/15 and s/p cryoablation and bx in 10/2017 Follow up imaging planned for 02/2018  3. Elevated PSA:      History of elevated up to PSA 5.6 s/p negative biopsy 10/2015  (HgPIN) Most recent PSA 5.3  01/2018  4. BPH (benign prostatic hyperplasia) with LUTS:    Continue flomax  Return in about 6 months (around 08/18/2018) for IPSS, PSA and exam.   Zara Council, [facility]  [place] [address] [place], [place] [address] [phone]

## 2018-02-17 LAB — BUN+CREAT
BUN/Creatinine Ratio: 13 (ref 10–24)
BUN: 13 mg/dL (ref 8–27)
Creatinine, Ser: 0.97 mg/dL (ref 0.76–1.27)
GFR calc Af Amer: 89 mL/min/{1.73_m2} (ref >59)
GFR, EST NON AFRICAN AMERICAN: 77 mL/min/{1.73_m2} (ref >59)

## 2018-02-19 LAB — CULTURE, URINE COMPREHENSIVE

## 2018-02-20 ENCOUNTER — Telehealth: Payer: Self-pay | Admitting: Family Medicine

## 2018-02-20 MED ORDER — AMOXICILLIN-POT CLAVULANATE 875-125 MG PO TABS: 1.0000 | ORAL_TABLET | Freq: Two times a day (BID) | ORAL | 0 refills | Status: DC

## 2018-02-20 NOTE — Telephone Encounter (Signed)
-----   Message from Nori Riis, PA-C sent at 02/20/2018  7:38 AM EDT ----- Please let Jason Contreras know that his urine culture was positive.  He needs to start Augmentin 875/125, twice daily for seven days.

## 2018-02-20 NOTE — Telephone Encounter (Signed)
Patient notified, RX sent to pharmacy

## 2018-03-01 ENCOUNTER — Telehealth: Payer: Self-pay | Admitting: Urology

## 2018-03-01 LAB — BLADDER SCAN AMB NON-IMAGING

## 2018-03-01 NOTE — Telephone Encounter (Signed)
[name] needs to have an appointment scheduled for a six-month follow-up with a PSA drawn prior to his appointment.

## 2018-03-02 ENCOUNTER — Ambulatory Visit
Admission: RE | Admit: 2018-03-02 | Discharge: 2018-03-02 | Disposition: A | Discharge status: Home / Self Care | Payer: Medicare Other | Source: Ambulatory Visit | Attending: Urology | Admitting: Urology

## 2018-03-02 ENCOUNTER — Other Ambulatory Visit: Payer: Medicare Other

## 2018-03-02 ENCOUNTER — Ambulatory Visit: Payer: Medicare Other

## 2018-03-02 DIAGNOSIS — K862 Cyst of pancreas: Secondary | ICD-10-CM | POA: Diagnosis not present

## 2018-03-02 DIAGNOSIS — C641 Malignant neoplasm of right kidney, except renal pelvis: Secondary | ICD-10-CM | POA: Diagnosis not present

## 2018-03-02 DIAGNOSIS — I2510 Atherosclerotic heart disease of native coronary artery without angina pectoris: Secondary | ICD-10-CM | POA: Insufficient documentation

## 2018-03-02 DIAGNOSIS — Z9889 Other specified postprocedural states: Secondary | ICD-10-CM | POA: Diagnosis not present

## 2018-03-02 DIAGNOSIS — I700 Atherosclerosis of aorta: Secondary | ICD-10-CM | POA: Insufficient documentation

## 2018-03-02 DIAGNOSIS — N390 Urinary tract infection, site not specified: Secondary | ICD-10-CM | POA: Diagnosis not present

## 2018-03-02 DIAGNOSIS — Z905 Acquired absence of kidney: Secondary | ICD-10-CM | POA: Insufficient documentation

## 2018-03-02 MED ORDER — IOPAMIDOL (ISOVUE-300) INJECTION 61%
100.0000 mL | Freq: Once | INTRAVENOUS | Status: AC | PRN
  Administered 2018-03-02: 100 mL via INTRAVENOUS

## 2018-03-05 ENCOUNTER — Encounter: Payer: Self-pay | Admitting: Radiology

## 2018-03-05 ENCOUNTER — Ambulatory Visit
Admission: RE | Admit: 2018-03-05 | Discharge: 2018-03-05 | Disposition: A | Discharge status: Home / Self Care | Payer: Medicare Other | Source: Ambulatory Visit | Attending: Interventional Radiology | Admitting: Interventional Radiology

## 2018-03-05 DIAGNOSIS — C641 Malignant neoplasm of right kidney, except renal pelvis: Secondary | ICD-10-CM | POA: Diagnosis not present

## 2018-03-05 HISTORY — PX: IR RADIOLOGIST EVAL & MGMT: IMG5224

## 2018-03-05 NOTE — Progress Notes (Signed)
Referring Physician(s): Dr Camillia Herter  Chief Complaint: The patient is seen in follow up today s/p CT guided 18 gauge core biopsy and percutaneous cryoablation of the 1.9 cm right renal cortical mass 10/17/17  History of present illness: Renal cell carcinoma diagnosed in 2015. Cryoablation done 10/17/2017. Partial nephrectomy in 2016. Recent urinary tract infection, on antibiotics.  Scheduled today for follow up with Dr Annamaria Boots CT 10/28:  IMPRESSION: 1. Status post interval ablation of the medial interpolar right renal lesion, without recurrent or metastatic disease. 2. Remote partial right nephrectomy anteriorly. No acute complication.  Doing well; denies N/V/ fever/chills Sleeping well; eating well Denies wt loss Ws just treated for UTI-- feels fine   Past Medical History:  Diagnosis Date  . Adenocarcinoma, renal cell ([place])   . Arthritis   . Diabetes mellitus without complication ([place])   . Elevated PSA   . Headache   . Hematuria   . Hyperlipidemia   . Hypertension   . Neuropathy   . Obesity   . PONV (postoperative nausea and vomiting)   . Renal insufficiency   . Right renal mass     Past Surgical History:  Procedure Laterality Date  . APPENDECTOMY    . CRYOABLATION  10/17/2017  . IR RADIOLOGIST EVAL & MGMT  07/17/2017  . IR RADIOLOGIST EVAL & MGMT  08/13/2017  . IR RADIOLOGIST EVAL & MGMT  11/12/2017  . RADIOLOGY WITH ANESTHESIA Right 10/17/2017   Procedure: CT WITH ANESTHESIA RENAL CRYOABLATION;  Surgeon: [name], MD;  Location: [facility];  Service: Anesthesiology;  Laterality: Right;  . ROBOTIC ASSITED PARTIAL NEPHRECTOMY Right 12/12/2015   Procedure: ROBOTIC ASSITED PARTIAL NEPHRECTOMY;  Surgeon: [name], MD;  Location: [facility] OR[facility];  Service: Urology;  Laterality: Right;    Allergies: Morphine and related  Medications: Prior to Admission medications   Medication Sig Start Date End Date Taking? Authorizing Provider  acetaminophen (TYLENOL) 325 MG  tablet Take 650 mg by mouth daily as needed for headache.   Yes [provider]  atorvastatin (LIPITOR) 20 MG tablet Take 1 tablet (20 mg total) by mouth daily at 6 PM. 01/08/18  Yes Crissman, Jeannette How, MD  carvedilol (COREG) 6.25 MG tablet Take 1 tablet (6.25 mg total) by mouth 2 (two) times daily. 01/08/18  Yes Crissman, Jeannette How, MD  gabapentin (NEURONTIN) 300 MG capsule Take 1 capsule (300 mg total) by mouth 4 (four) times daily. 01/08/18  Yes Crissman, Jeannette How, MD  glimepiride (AMARYL) 4 MG tablet Take 0.5 tablets (2 mg total) by mouth daily. 01/08/18  Yes Crissman, Jeannette How, MD  losartan (COZAAR) 100 MG tablet Take 1 tablet (100 mg total) by mouth daily. 01/08/18  Yes Crissman, Jeannette How, MD  metFORMIN (GLUCOPHAGE) 500 MG tablet Take 2 tablets (1,000 mg total) by mouth 2 (two) times daily. 01/08/18  Yes Crissman, Jeannette How, MD  pioglitazone (ACTOS) 45 MG tablet Take 1 tablet (45 mg total) by mouth daily. 01/08/18  Yes Crissman, Jeannette How, MD  tamsulosin (FLOMAX) 0.4 MG CAPS capsule Take 1 capsule (0.4 mg total) by mouth daily. 01/08/18  Yes Crissman, Jeannette How, MD  mometasone (ELOCON) 0.1 % cream APPLY  CREAM TOPICALLY ONCE DAILY Patient not taking: Reported on 02/16/2018 07/31/17   [name], MD     Family History  Problem Relation Age of Onset  . Bone cancer Mother   . Stroke Brother   . Kidney disease Neg Hx     Social History   Socioeconomic  History  . Marital status: Married    Spouse name: Not on file  . Number of children: Not on file  . Years of education: 3 years college   . Highest education level: Some college, no degree  Occupational History  . Not on file  Social Needs  . Financial resource strain: Not hard at all  . Food insecurity:    Worry: Never true    Inability: Never true  . Transportation needs:    Medical: No    Non-medical: No  Tobacco Use  . Smoking status: Never Smoker  . Smokeless tobacco: Never Used  Substance and Sexual Activity  . Alcohol use: No  . Drug use:  No  . Sexual activity: Not on file  Lifestyle  . Physical activity:    Days per week: 0 days    Minutes per session: 0 min  . Stress: Not at all  Relationships  . Social connections:    Talks on phone: More than three times a week    Gets together: More than three times a week    Attends religious service: More than 4 times per year    Active member of club or organization: No    Attends meetings of clubs or organizations: Never    Relationship status: Married  Other Topics Concern  . Not on file  Social History Narrative  . Not on file     Vital Signs: BP 140/66   Pulse 80   Temp 98.1 F (36.7 C) (Oral)   Resp 17   Ht 5\' 10"  (1.778 m)   Wt 226 lb (102.5 kg)   SpO2 97%   BMI 32.43 kg/m   Physical Exam  Constitutional: He is oriented to person, place, and time.  Cardiovascular: Normal rate and regular rhythm.  Pulmonary/Chest: Effort normal and breath sounds normal.  Abdominal: Soft.  Neurological: He is alert and oriented to person, place, and time.  Skin: Skin is warm.  Psychiatric: He has a normal mood and affect. His behavior is normal.  Vitals reviewed.   Imaging: Ct Abd Wo & W Cm  Result Date: 03/02/2018 CLINICAL DATA:  Renal cell carcinoma diagnosed in 2015. Cryoablation done 10/17/2017. Partial nephrectomy in 2016. Recent urinary tract infection, on antibiotics. Appendectomy. EXAM: CT ABDOMEN WITHOUT AND WITH CONTRAST TECHNIQUE: Multidetector CT imaging of the abdomen was performed following the standard protocol before and following the bolus administration of intravenous contrast. CONTRAST:  198mL ISOVUE-300 IOPAMIDOL (ISOVUE-300) INJECTION 61% COMPARISON:  Ablation CT of 10/17/2017.  Abdominal MRI of 07/26/2017 FINDINGS: Lower chest: Clear lung bases. Normal heart size, without pericardial effusion. Lipomatous hypertrophy of the interatrial septum. Multivessel coronary artery atherosclerosis. Hepatobiliary: Normal liver. Normal gallbladder, without biliary  ductal dilatation. Pancreas: Normal, without mass or ductal dilatation. The 5 mm cystic lesion described on prior MRI is not readily apparent. Spleen: Normal in size, without focal abnormality. Adrenals/Urinary Tract: Normal right adrenal gland. Mild left adrenal thickening. Mild renal cortical thinning bilaterally. No renal calculi or hydronephrosis. Normal appearance of the left kidney for age. Status post anterior interpolar partial right nephrectomy. The medial interpolar right renal ablation site is identified at on the order of 1.9 x 1.5 cm on image 104/8. This includes the site of the previously described enhancing right renal lesion. No residual enhancement identified. No hydronephrosis or collecting system complication. Stomach/Bowel: Normal stomach, without wall thickening. Extensive colonic diverticulosis. Normal abdominal portions of the terminal ileum and appendix. Normal abdominal small bowel. Vascular/Lymphatic: Aortic and branch  vessel atherosclerosis. Patent renal veins. No retroperitoneal or retrocrural adenopathy. Other: No ascites. Musculoskeletal: Moderate lumbosacral spondylosis. IMPRESSION: 1. Status post interval ablation of the medial interpolar right renal lesion, without recurrent or metastatic disease. 2. Remote partial right nephrectomy anteriorly. No acute complication. 3. Coronary artery atherosclerosis. Aortic Atherosclerosis (ICD10-I70.0). Electronically Signed   By: Abigail Miyamoto M.D.   On: 03/02/2018 14:06    Labs:  CBC: Recent Labs    10/14/17 1530  WBC 5.8  HGB 14.1  HCT 42.5  PLT 235    COAGS: Recent Labs    10/14/17 1530  INR 0.98    BMP: Recent Labs    06/19/17 1419 07/29/17 1406 10/14/17 1530 02/16/18 1114  NA  --  143 143  --   K  --  4.9 4.9  --   CL  --  102 105  --   CO2  --  25 29  --   GLUCOSE  --  109* 103*  --   BUN  --  13 16 13   CALCIUM  --  9.6 9.4  --   CREATININE 1.00 0.92 1.08 0.97  GFRNONAA  --  82 >60 77  GFRAA  --  94 >60  89    LIVER FUNCTION TESTS: Recent Labs    07/29/17 1354  AST 21  ALT 22    Assessment:  Rt renal cell cryoablation 10/17/17 Doing well CT shows no recurrence Dr Annamaria Boots rec: CT 6 mo and evaluation in clinic He is agreeable to plan  Signed: Siana Panameno A, PA-C 03/05/2018, 2:40 PM   Please refer to Dr. Annamaria Boots attestation of this note for management and plan.

## 2018-03-12 ENCOUNTER — Ambulatory Visit (INDEPENDENT_AMBULATORY_CARE_PROVIDER_SITE_OTHER): Payer: Medicare Other | Admitting: Family Medicine

## 2018-03-12 ENCOUNTER — Encounter: Payer: Self-pay | Admitting: Family Medicine

## 2018-03-12 VITALS — BP 156/86 | HR 85 | Temp 98.3°F | Ht 68.62 in | Wt 227.0 lb

## 2018-03-12 DIAGNOSIS — I10 Essential (primary) hypertension: Secondary | ICD-10-CM

## 2018-03-12 DIAGNOSIS — Z0000 Encounter for general adult medical examination without abnormal findings: Secondary | ICD-10-CM | POA: Diagnosis not present

## 2018-03-12 DIAGNOSIS — Z7189 Other specified counseling: Secondary | ICD-10-CM | POA: Diagnosis not present

## 2018-03-12 DIAGNOSIS — Z23 Encounter for immunization: Secondary | ICD-10-CM

## 2018-03-12 DIAGNOSIS — E0821 Diabetes mellitus due to underlying condition with diabetic nephropathy: Secondary | ICD-10-CM

## 2018-03-12 DIAGNOSIS — E785 Hyperlipidemia, unspecified: Secondary | ICD-10-CM

## 2018-03-12 MED ORDER — GABAPENTIN 300 MG PO CAPS: 300.0000 mg | ORAL_CAPSULE | Freq: Four times a day (QID) | ORAL | 4 refills | Status: DC

## 2018-03-12 MED ORDER — PIOGLITAZONE HCL 45 MG PO TABS: 45.0000 mg | ORAL_TABLET | Freq: Every day | ORAL | 4 refills | Status: DC

## 2018-03-12 MED ORDER — GLIMEPIRIDE 4 MG PO TABS: 2.0000 mg | ORAL_TABLET | Freq: Every day | ORAL | 4 refills | Status: DC

## 2018-03-12 MED ORDER — ATORVASTATIN CALCIUM 20 MG PO TABS: 20.0000 mg | ORAL_TABLET | Freq: Every day | ORAL | 4 refills | Status: DC

## 2018-03-12 MED ORDER — AMLODIPINE BESYLATE 5 MG PO TABS: 5.0000 mg | ORAL_TABLET | Freq: Every day | ORAL | 3 refills | Status: DC

## 2018-03-12 MED ORDER — TAMSULOSIN HCL 0.4 MG PO CAPS: 0.4000 mg | ORAL_CAPSULE | Freq: Every day | ORAL | 4 refills | Status: DC

## 2018-03-12 MED ORDER — METFORMIN HCL 500 MG PO TABS: 1000.0000 mg | ORAL_TABLET | Freq: Two times a day (BID) | ORAL | 4 refills | Status: DC

## 2018-03-12 MED ORDER — LOSARTAN POTASSIUM 100 MG PO TABS: 100.0000 mg | ORAL_TABLET | Freq: Every day | ORAL | 4 refills | Status: DC

## 2018-03-12 MED ORDER — CARVEDILOL 6.25 MG PO TABS: 6.2500 mg | ORAL_TABLET | Freq: Two times a day (BID) | ORAL | 4 refills | Status: DC

## 2018-03-12 MED ORDER — LIDOCAINE 5 % EX OINT: 1.0000 "application " | TOPICAL_OINTMENT | Freq: Three times a day (TID) | CUTANEOUS | 5 refills | Status: DC | PRN

## 2018-03-12 NOTE — Assessment & Plan Note (Signed)
Discussed blood pressure elevated will add amlodipine 5 mg recheck 1 month or so

## 2018-03-12 NOTE — Progress Notes (Signed)
BP (!) 156/86 (BP Location: Left Arm, Patient Position: Sitting, Cuff Size: Normal)   Pulse 85   Temp 98.3 F (36.8 C)   Ht 5' 8.62" (1.743 m)   Wt 227 lb (103 kg)   SpO2 97%   BMI 33.89 kg/m    Subjective:    Patient ID: Jason Contreras, male    DOB: 1940-07-11, 77 y.o.   MRN: [id number]  HPI: Jason Contreras is a 77 y.o. male  Chief Complaint  Patient presents with  . Annual Exam  Patient with significant right flank pain which resolved with treating UTI.  Patient is taking Augmentin and is finished that with good relief. We will check urinalysis today to assess complete clearing. Patient's previous urine was equivocal with culture positive. Discuss hypertension and on chart review patient's blood pressures been elevated throughout the year. Taking cholesterol medicine without problems. No low blood sugar spells or other diabetes type issues taking medications faithfully. Still taking tamsulosin for water works and doing okay. Followed by urology for renal cell cancer.  And urology issues.  Relevant past medical, surgical, family and social history reviewed and updated as indicated. Interim medical history since our last visit reviewed. Allergies and medications reviewed and updated.  Review of Systems  Constitutional: Negative.   HENT: Negative.   Eyes: Negative.   Respiratory: Negative.   Cardiovascular: Negative.   Gastrointestinal: Negative.   Endocrine: Negative.   Genitourinary: Negative.   Musculoskeletal: Negative.   Skin: Negative.   Allergic/Immunologic: Negative.   Neurological: Negative.   Hematological: Negative.   Psychiatric/Behavioral: Negative.     Per HPI unless specifically indicated above     Objective:    BP (!) 156/86 (BP Location: Left Arm, Patient Position: Sitting, Cuff Size: Normal)   Pulse 85   Temp 98.3 F (36.8 C)   Ht 5' 8.62" (1.743 m)   Wt 227 lb (103 kg)   SpO2 97%   BMI 33.89 kg/m   Wt Readings from Last 3 Encounters:    03/12/18 227 lb (103 kg)  03/05/18 226 lb (102.5 kg)  02/16/18 226 lb 1.6 oz (102.6 kg)    Physical Exam  Constitutional: He is oriented to person, place, and time. He appears well-developed and well-nourished.  HENT:  Head: Normocephalic.  Right Ear: External ear normal.  Left Ear: External ear normal.  Nose: Nose normal.  Eyes: Pupils are equal, round, and reactive to light. Conjunctivae and EOM are normal.  Neck: Normal range of motion. Neck supple. No thyromegaly present.  Cardiovascular: Normal rate, regular rhythm, normal heart sounds and intact distal pulses.  Pulmonary/Chest: Effort normal and breath sounds normal.  Abdominal: Soft. Bowel sounds are normal. There is no splenomegaly or hepatomegaly.  Genitourinary:  Genitourinary Comments: Done at urology  Musculoskeletal: Normal range of motion.  Lymphadenopathy:    He has no cervical adenopathy.  Neurological: He is alert and oriented to person, place, and time. He has normal reflexes.  Skin: Skin is warm and dry.  Psychiatric: He has a normal mood and affect. His behavior is normal. Judgment and thought content normal.    Results for orders placed or performed in visit on 02/16/18  CULTURE, URINE COMPREHENSIVE  Result Value Ref Range   Urine Culture, Comprehensive Final report (A)    Organism ID, Bacteria Klebsiella oxytoca (A)    ANTIMICROBIAL SUSCEPTIBILITY Comment   Microscopic Examination  Result Value Ref Range   WBC, UA None seen 0 - 5 /hpf  RBC, UA 0-2 0 - 2 /hpf   Epithelial Cells (non renal) None seen 0 - 10 /hpf   Casts Present (A) None seen /lpf   Cast Type Hyaline casts N/A   Mucus, UA Present (A) Not Estab.   Bacteria, UA Few (A) None seen/Few  Urinalysis, Complete  Result Value Ref Range   Specific Gravity, UA 1.020 1.005 - 1.030   pH, UA 5.5 5.0 - 7.5   Color, UA Yellow Yellow   Appearance Ur Clear Clear   Leukocytes, UA Negative Negative   Protein, UA Negative Negative/Trace   Glucose, UA  Negative Negative   Ketones, UA Negative Negative   RBC, UA Trace (A) Negative   Bilirubin, UA Negative Negative   Urobilinogen, Ur 0.2 0.2 - 1.0 mg/dL   Nitrite, UA Negative Negative   Microscopic Examination See below:   BUN+Creat  Result Value Ref Range   BUN 13 8 - 27 mg/dL   Creatinine, Ser 0.97 0.76 - 1.27 mg/dL   GFR calc non Af Amer 77 >59 mL/min/1.73   GFR calc Af Amer 89 >59 mL/min/1.73   BUN/Creatinine Ratio 13 10 - 24  Bladder Scan (Post Void Residual) in office  Result Value Ref Range   Scan Result 127 mL       Assessment & Plan:   Problem List Items Addressed This Visit      Cardiovascular and Mediastinum   Hypertension    Discussed blood pressure elevated will add amlodipine 5 mg recheck 1 month or so      Relevant Medications   amLODipine (NORVASC) 5 MG tablet   losartan (COZAAR) 100 MG tablet   atorvastatin (LIPITOR) 20 MG tablet   carvedilol (COREG) 6.25 MG tablet   Other Relevant Orders   Comprehensive metabolic panel   CBC with Differential/Platelet   TSH   Urinalysis, Routine w reflex microscopic     Endocrine   DM due to underlying condition with diabetic nephropathy (HCC)    The current medical regimen is effective;  continue present plan and medications.       Relevant Medications   metFORMIN (GLUCOPHAGE) 500 MG tablet   losartan (COZAAR) 100 MG tablet   glimepiride (AMARYL) 4 MG tablet   atorvastatin (LIPITOR) 20 MG tablet   gabapentin (NEURONTIN) 300 MG capsule   pioglitazone (ACTOS) 45 MG tablet   Other Relevant Orders   TSH   Urinalysis, Routine w reflex microscopic   Bayer DCA Hb A1c Waived     Other   Hyperlipidemia    The current medical regimen is effective;  continue present plan and medications.       Relevant Medications   amLODipine (NORVASC) 5 MG tablet   losartan (COZAAR) 100 MG tablet   atorvastatin (LIPITOR) 20 MG tablet   carvedilol (COREG) 6.25 MG tablet   Other Relevant Orders   Lipid panel   CBC with  Differential/Platelet   TSH   Urinalysis, Routine w reflex microscopic   Advanced care planning/counseling discussion    A voluntary discussion about advanced care planning including explanation and discussion of advanced directives was extentively discussed with the patient.  Explained about the healthcare proxy and living will was reviewed and packet with forms with expiration of how to fill them out was given.  Time spent: Encounter 16+ min individuals present: Patient       Other Visit Diagnoses    Immunization due    -  Primary   Relevant Orders   Flu vaccine  HIGH DOSE PF (Fluzone High dose) (Completed)       Follow up plan: Return in about 6 months (around 09/10/2018) for Hemoglobin A1c, BMP,  Lipids, ALT, AST.

## 2018-03-12 NOTE — Assessment & Plan Note (Signed)
A voluntary discussion about advanced care planning including explanation and discussion of advanced directives was extentively discussed with the patient.  Explained about the healthcare proxy and living will was reviewed and packet with forms with expiration of how to fill them out was given.  Time spent: Encounter 16+ min individuals present: Patient 

## 2018-03-12 NOTE — Addendum Note (Signed)
Addended by: Golden Pop A on: 03/12/2018 01:49 PM   Modules accepted: Orders

## 2018-03-12 NOTE — Assessment & Plan Note (Signed)
The current medical regimen is effective;  continue present plan and medications.  

## 2018-03-13 LAB — CBC WITH DIFFERENTIAL/PLATELET
BASOS: 0 %
Basophils Absolute: 0 10*3/uL (ref 0.0–0.2)
EOS (ABSOLUTE): 0.2 10*3/uL (ref 0.0–0.4)
Eos: 4 %
Hematocrit: 39.8 % (ref 37.5–51.0)
Hemoglobin: 13.5 g/dL (ref 13.0–17.7)
IMMATURE GRANULOCYTES: 0 %
Immature Grans (Abs): 0 10*3/uL (ref 0.0–0.1)
LYMPHS ABS: 1.1 10*3/uL (ref 0.7–3.1)
Lymphs: 24 %
MCH: 32.3 pg (ref 26.6–33.0)
MCHC: 33.9 g/dL (ref 31.5–35.7)
MCV: 95 fL (ref 79–97)
MONOS ABS: 0.4 10*3/uL (ref 0.1–0.9)
Monocytes: 9 %
NEUTROS PCT: 63 %
Neutrophils Absolute: 2.8 10*3/uL (ref 1.4–7.0)
PLATELETS: 218 10*3/uL (ref 150–450)
RBC: 4.18 x10E6/uL (ref 4.14–5.80)
RDW: 13.1 % (ref 12.3–15.4)
WBC: 4.5 10*3/uL (ref 3.4–10.8)

## 2018-03-13 LAB — COMPREHENSIVE METABOLIC PANEL WITH GFR
ALT: 10 [IU]/L (ref 0–44)
AST: 16 [IU]/L (ref 0–40)
Albumin/Globulin Ratio: 1.8 (ref 1.2–2.2)
Albumin: 4 g/dL (ref 3.5–4.8)
Alkaline Phosphatase: 69 [IU]/L (ref 39–117)
BUN/Creatinine Ratio: 12 (ref 10–24)
BUN: 12 mg/dL (ref 8–27)
Bilirubin Total: 0.5 mg/dL (ref 0.0–1.2)
CO2: 24 mmol/L (ref 20–29)
Calcium: 8.9 mg/dL (ref 8.6–10.2)
Chloride: 104 mmol/L (ref 96–106)
Creatinine, Ser: 1.01 mg/dL (ref 0.76–1.27)
GFR calc Af Amer: 84 mL/min/{1.73_m2}
GFR calc non Af Amer: 72 mL/min/{1.73_m2}
Globulin, Total: 2.2 g/dL (ref 1.5–4.5)
Glucose: 77 mg/dL (ref 65–99)
Potassium: 4.4 mmol/L (ref 3.5–5.2)
Sodium: 144 mmol/L (ref 134–144)
Total Protein: 6.2 g/dL (ref 6.0–8.5)

## 2018-03-13 LAB — URINALYSIS, ROUTINE W REFLEX MICROSCOPIC
BILIRUBIN UA: NEGATIVE
GLUCOSE, UA: NEGATIVE
Ketones, UA: NEGATIVE
Leukocytes, UA: NEGATIVE
Nitrite, UA: NEGATIVE
Protein, UA: NEGATIVE
RBC, UA: NEGATIVE
Specific Gravity, UA: 1.015 (ref 1.005–1.030)
Urobilinogen, Ur: 2 mg/dL — ABNORMAL HIGH (ref 0.2–1.0)
pH, UA: 5.5 (ref 5.0–7.5)

## 2018-03-13 LAB — LIPID PANEL
Chol/HDL Ratio: 2.5 ratio (ref 0.0–5.0)
Cholesterol, Total: 98 mg/dL — ABNORMAL LOW (ref 100–199)
HDL: 39 mg/dL — ABNORMAL LOW
LDL Calculated: 40 mg/dL (ref 0–99)
Triglycerides: 93 mg/dL (ref 0–149)
VLDL Cholesterol Cal: 19 mg/dL (ref 5–40)

## 2018-03-13 LAB — TSH: TSH: 3.04 u[IU]/mL (ref 0.450–4.500)

## 2018-03-13 LAB — BAYER DCA HB A1C WAIVED: HB A1C (BAYER DCA - WAIVED): 5.7 %

## 2018-03-17 ENCOUNTER — Encounter: Payer: Self-pay | Admitting: Family Medicine

## 2018-04-15 ENCOUNTER — Ambulatory Visit (INDEPENDENT_AMBULATORY_CARE_PROVIDER_SITE_OTHER): Payer: Medicare Other | Admitting: Family Medicine

## 2018-04-15 ENCOUNTER — Encounter: Payer: Self-pay | Admitting: Family Medicine

## 2018-04-15 VITALS — BP 124/77 | HR 85 | Temp 98.4°F | Wt 227.4 lb

## 2018-04-15 DIAGNOSIS — I10 Essential (primary) hypertension: Secondary | ICD-10-CM

## 2018-04-15 DIAGNOSIS — R109 Unspecified abdominal pain: Secondary | ICD-10-CM | POA: Diagnosis not present

## 2018-04-15 DIAGNOSIS — C641 Malignant neoplasm of right kidney, except renal pelvis: Secondary | ICD-10-CM | POA: Diagnosis not present

## 2018-04-15 LAB — MICROSCOPIC EXAMINATION
Bacteria, UA: NONE SEEN
RBC, UA: NONE SEEN /hpf (ref 0–2)
WBC, UA: NONE SEEN /hpf (ref 0–5)

## 2018-04-15 LAB — URINALYSIS, ROUTINE W REFLEX MICROSCOPIC
Bilirubin, UA: NEGATIVE
GLUCOSE, UA: NEGATIVE
Leukocytes, UA: NEGATIVE
Nitrite, UA: NEGATIVE
Protein, UA: NEGATIVE
RBC, UA: NEGATIVE
Specific Gravity, UA: 1.025 (ref 1.005–1.030)
Urobilinogen, Ur: 2 mg/dL — ABNORMAL HIGH (ref 0.2–1.0)
pH, UA: 5.5 (ref 5.0–7.5)

## 2018-04-15 MED ORDER — AMLODIPINE BESYLATE 2.5 MG PO TABS: 2.5000 mg | ORAL_TABLET | Freq: Every day | ORAL | 3 refills | Status: DC

## 2018-04-15 NOTE — Assessment & Plan Note (Addendum)
Labile blood pressure too high now too low will decrease amlodipine from 5 mg to 2-1/2 mg gave prescription for same as 5 mg pill is too small to cut in half. Recheck blood pressure in a month or so and reassess EKG done which is normal sinus with no acute changes

## 2018-04-15 NOTE — Progress Notes (Signed)
BP 124/77 (BP Location: Left Arm, Cuff Size: Normal)   Pulse 85   Temp 98.4 F (36.9 C) (Oral)   Wt 227 lb 6.4 oz (103.1 kg)   SpO2 97%   BMI 33.95 kg/m    Subjective:    Patient ID: Jason Contreras, male    DOB: [birth year], 77 y.o.   MRN: [id number]  HPI: Jason Contreras is a 77 y.o. male  Chief Complaint  Patient presents with  . Hypertension    1 month f/up- pt states he has been feeling tired and fatigued, had a couple to spells since starting the amlodipine    Patient follow-up with concerns about blood pressure getting too low patient will stand up feel very lightheaded weak somewhat dizzy fatigue having spells some of these started after starting amlodipine some of been ongoing. Patient's been having especially pain in the postoperative area of his right hip kidney area that is been ongoing intermittently to the point where he has difficulty standing at times. Patient also having had history of infection in that area with the kidney which caused problems not having any burning or hurting on urination today. Concerned about heart with blood pressure spells.  Relevant past medical, surgical, family and social history reviewed and updated as indicated. Interim medical history since our last visit reviewed. Allergies and medications reviewed and updated.  Review of Systems  Constitutional: Negative.   Respiratory: Negative.   Cardiovascular: Negative.     Per HPI unless specifically indicated above     Objective:    BP 124/77 (BP Location: Left Arm, Cuff Size: Normal)   Pulse 85   Temp 98.4 F (36.9 C) (Oral)   Wt 227 lb 6.4 oz (103.1 kg)   SpO2 97%   BMI 33.95 kg/m   Wt Readings from Last 3 Encounters:  04/15/18 227 lb 6.4 oz (103.1 kg)  03/12/18 227 lb (103 kg)  03/05/18 226 lb (102.5 kg)    Physical Exam  Constitutional: He is oriented to person, place, and time. He appears well-developed and well-nourished.  HENT:  Head: Normocephalic and atraumatic.    Eyes: Conjunctivae and EOM are normal.  Neck: Normal range of motion.  Cardiovascular: Normal rate, regular rhythm and normal heart sounds.  Pulmonary/Chest: Effort normal and breath sounds normal.  Musculoskeletal: Normal range of motion.  Neurological: He is alert and oriented to person, place, and time.  Skin: No erythema.  Psychiatric: He has a normal mood and affect. His behavior is normal. Judgment and thought content normal.    Results for orders placed or performed in visit on 03/12/18  Comprehensive metabolic panel  Result Value Ref Range   Glucose 77 65 - 99 mg/dL   BUN 12 8 - 27 mg/dL   Creatinine, Ser 1.01 0.76 - 1.27 mg/dL   GFR calc non Af Amer 72 >59 mL/min/1.73   GFR calc Af Amer 84 >59 mL/min/1.73   BUN/Creatinine Ratio 12 10 - 24   Sodium 144 134 - 144 mmol/L   Potassium 4.4 3.5 - 5.2 mmol/L   Chloride 104 96 - 106 mmol/L   CO2 24 20 - 29 mmol/L   Calcium 8.9 8.6 - 10.2 mg/dL   Total Protein 6.2 6.0 - 8.5 g/dL   Albumin 4.0 3.5 - 4.8 g/dL   Globulin, Total 2.2 1.5 - 4.5 g/dL   Albumin/Globulin Ratio 1.8 1.2 - 2.2   Bilirubin Total 0.5 0.0 - 1.2 mg/dL   Alkaline Phosphatase 69 39 - 117  IU/L   AST 16 0 - 40 IU/L   ALT 10 0 - 44 IU/L  Lipid panel  Result Value Ref Range   Cholesterol, Total 98 (L) 100 - 199 mg/dL   Triglycerides 93 0 - 149 mg/dL   HDL 39 (L) >39 mg/dL   VLDL Cholesterol Cal 19 5 - 40 mg/dL   LDL Calculated 40 0 - 99 mg/dL   Chol/HDL Ratio 2.5 0.0 - 5.0 ratio  CBC with Differential/Platelet  Result Value Ref Range   WBC 4.5 3.4 - 10.8 x10E3/uL   RBC 4.18 4.14 - 5.80 x10E6/uL   Hemoglobin 13.5 13.0 - 17.7 g/dL   Hematocrit 39.8 37.5 - 51.0 %   MCV 95 79 - 97 fL   MCH 32.3 26.6 - 33.0 pg   MCHC 33.9 31.5 - 35.7 g/dL   RDW 13.1 12.3 - 15.4 %   Platelets 218 150 - 450 x10E3/uL   Neutrophils 63 Not Estab. %   Lymphs 24 Not Estab. %   Monocytes 9 Not Estab. %   Eos 4 Not Estab. %   Basos 0 Not Estab. %   Neutrophils Absolute 2.8 1.4 -  7.0 x10E3/uL   Lymphocytes Absolute 1.1 0.7 - 3.1 x10E3/uL   Monocytes Absolute 0.4 0.1 - 0.9 x10E3/uL   EOS (ABSOLUTE) 0.2 0.0 - 0.4 x10E3/uL   Basophils Absolute 0.0 0.0 - 0.2 x10E3/uL   Immature Granulocytes 0 Not Estab. %   Immature Grans (Abs) 0.0 0.0 - 0.1 x10E3/uL  TSH  Result Value Ref Range   TSH 3.040 0.450 - 4.500 uIU/mL  Urinalysis, Routine w reflex microscopic  Result Value Ref Range   Specific Gravity, UA 1.015 1.005 - 1.030   pH, UA 5.5 5.0 - 7.5   Color, UA Orange Yellow   Appearance Ur Clear Clear   Leukocytes, UA Negative Negative   Protein, UA Negative Negative/Trace   Glucose, UA Negative Negative   Ketones, UA Negative Negative   RBC, UA Negative Negative   Bilirubin, UA Negative Negative   Urobilinogen, Ur 2.0 (H) 0.2 - 1.0 mg/dL   Nitrite, UA Negative Negative  Bayer DCA Hb A1c Waived  Result Value Ref Range   HB A1C (BAYER DCA - WAIVED) 5.7 <7.0 %      Assessment & Plan:   Problem List Items Addressed This Visit      Cardiovascular and Mediastinum   Hypertension - Primary    Labile blood pressure too high now too low will decrease amlodipine from 5 mg to 2-1/2 mg gave prescription for same as 5 mg pill is too small to cut in half. Recheck blood pressure in a month or so and reassess EKG done which is normal sinus with no acute changes      Relevant Medications   amLODipine (NORVASC) 2.5 MG tablet   Other Relevant Orders   EKG 12-Lead (Completed)     Genitourinary   Renal cell cancer, right (HCC)    Right nephrectomy or partial nephrectomy with pain that right flank area will give some Zostrix to try to see if that makes a difference patient education on use Urinalysis normal       Other Visit Diagnoses    Flank pain       Relevant Orders   Urinalysis, Routine w reflex microscopic       Follow up plan: Return in about 2 months (around 06/16/2018), or if symptoms worsen or fail to improve.

## 2018-04-15 NOTE — Assessment & Plan Note (Addendum)
Right nephrectomy or partial nephrectomy with pain that right flank area will give some Zostrix to try to see if that makes a difference patient education on use Urinalysis normal

## 2018-06-16 ENCOUNTER — Encounter: Payer: Self-pay | Admitting: Family Medicine

## 2018-06-16 ENCOUNTER — Ambulatory Visit (INDEPENDENT_AMBULATORY_CARE_PROVIDER_SITE_OTHER): Payer: Medicare Other | Admitting: Family Medicine

## 2018-06-16 VITALS — BP 128/67 | HR 71 | Temp 98.4°F | Wt 227.4 lb

## 2018-06-16 DIAGNOSIS — C641 Malignant neoplasm of right kidney, except renal pelvis: Secondary | ICD-10-CM

## 2018-06-16 DIAGNOSIS — E782 Mixed hyperlipidemia: Secondary | ICD-10-CM

## 2018-06-16 DIAGNOSIS — D692 Other nonthrombocytopenic purpura: Secondary | ICD-10-CM | POA: Diagnosis not present

## 2018-06-16 DIAGNOSIS — I10 Essential (primary) hypertension: Secondary | ICD-10-CM | POA: Diagnosis not present

## 2018-06-16 DIAGNOSIS — E0821 Diabetes mellitus due to underlying condition with diabetic nephropathy: Secondary | ICD-10-CM | POA: Diagnosis not present

## 2018-06-16 MED ORDER — AMLODIPINE BESYLATE 2.5 MG PO TABS: 2.5000 mg | ORAL_TABLET | Freq: Every day | ORAL | 3 refills | Status: DC

## 2018-06-16 NOTE — Assessment & Plan Note (Signed)
The current medical regimen is effective;  continue present plan and medications.  

## 2018-06-16 NOTE — Assessment & Plan Note (Signed)
stable °

## 2018-06-16 NOTE — Progress Notes (Signed)
BP 128/67   Pulse 71   Temp 98.4 F (36.9 C) (Oral)   Wt 227 lb 6.4 oz (103.1 kg)   SpO2 97%   BMI 33.95 kg/m    Subjective:    Patient ID: Jason Contreras, male    DOB: 27-Aug-1940, 78 y.o.   MRN: [id number]  HPI: Jason Contreras is a 78 y.o. male  Chief Complaint  Patient presents with  . Hypertension    2 month f/up   Patient's blood pressures doing very well.  Started doing well after his kidney pain went away which seem to be a repositioning with a pole and subsequently pain stopped and has not had pain since. Blood pressures been doing well. No complaints from medication and taking faithfully without problems. Neuropathy stable diabetes stable with no complaints.  Relevant past medical, surgical, family and social history reviewed and updated as indicated. Interim medical history since our last visit reviewed. Allergies and medications reviewed and updated.  Review of Systems  Constitutional: Negative.   Respiratory: Negative.   Cardiovascular: Negative.     Per HPI unless specifically indicated above     Objective:    BP 128/67   Pulse 71   Temp 98.4 F (36.9 C) (Oral)   Wt 227 lb 6.4 oz (103.1 kg)   SpO2 97%   BMI 33.95 kg/m   Wt Readings from Last 3 Encounters:  06/16/18 227 lb 6.4 oz (103.1 kg)  04/15/18 227 lb 6.4 oz (103.1 kg)  03/12/18 227 lb (103 kg)    Physical Exam Constitutional:      Appearance: He is well-developed.  HENT:     Head: Normocephalic and atraumatic.  Eyes:     Conjunctiva/sclera: Conjunctivae normal.  Neck:     Musculoskeletal: Normal range of motion.  Cardiovascular:     Rate and Rhythm: Normal rate and regular rhythm.     Heart sounds: Normal heart sounds.  Pulmonary:     Effort: Pulmonary effort is normal.     Breath sounds: Normal breath sounds.  Musculoskeletal: Normal range of motion.  Skin:    Findings: No erythema.  Neurological:     Mental Status: He is alert and oriented to person, place, and time.    Psychiatric:        Behavior: Behavior normal.        Thought Content: Thought content normal.        Judgment: Judgment normal.     Results for orders placed or performed in visit on 04/15/18  Microscopic Examination  Result Value Ref Range   WBC, UA None seen 0 - 5 /hpf   RBC, UA None seen 0 - 2 /hpf   Epithelial Cells (non renal) 0-10 0 - 10 /hpf   Crystals Present (A) N/A   Crystal Type Amorphous Sediment N/A   Bacteria, UA None seen None seen/Few  Urinalysis, Routine w reflex microscopic  Result Value Ref Range   Specific Gravity, UA 1.025 1.005 - 1.030   pH, UA 5.5 5.0 - 7.5   Color, UA Yellow Yellow   Appearance Ur Clear Clear   Leukocytes, UA Negative Negative   Protein, UA Negative Negative/Trace   Glucose, UA Negative Negative   Ketones, UA Trace (A) Negative   RBC, UA Negative Negative   Bilirubin, UA Negative Negative   Urobilinogen, Ur 2.0 (H) 0.2 - 1.0 mg/dL   Nitrite, UA Negative Negative   Microscopic Examination See below:       Assessment &  Plan:   Problem List Items Addressed This Visit      Cardiovascular and Mediastinum   Hypertension - Primary    The current medical regimen is effective;  continue present plan and medications.       Relevant Medications   amLODipine (NORVASC) 2.5 MG tablet   Senile purpura (HCC)    stable      Relevant Medications   amLODipine (NORVASC) 2.5 MG tablet     Endocrine   DM due to underlying condition with diabetic nephropathy ([place])    The current medical regimen is effective;  continue present plan and medications.         Genitourinary   Adenocarcinoma, renal cell ([place])    The current medical regimen is effective;  continue present plan and medications.         Other   Hyperlipidemia    The current medical regimen is effective;  continue present plan and medications.       Relevant Medications   amLODipine (NORVASC) 2.5 MG tablet       Follow up plan: Return in about 3 months (around  09/14/2018) for Hemoglobin A1c, BMP,  Lipids, ALT, AST.

## 2018-08-31 ENCOUNTER — Other Ambulatory Visit: Payer: Self-pay | Admitting: Radiology

## 2018-08-31 DIAGNOSIS — R972 Elevated prostate specific antigen [PSA]: Secondary | ICD-10-CM

## 2018-09-02 ENCOUNTER — Other Ambulatory Visit: Payer: Medicare Other

## 2018-09-04 ENCOUNTER — Ambulatory Visit: Payer: Medicare Other | Admitting: Urology

## 2018-09-07 ENCOUNTER — Ambulatory Visit: Payer: Medicare Other | Admitting: Urology

## 2018-10-07 ENCOUNTER — Ambulatory Visit (INDEPENDENT_AMBULATORY_CARE_PROVIDER_SITE_OTHER): Payer: Medicare Other | Admitting: Family Medicine

## 2018-10-07 ENCOUNTER — Encounter: Payer: Self-pay | Admitting: Family Medicine

## 2018-10-07 ENCOUNTER — Other Ambulatory Visit: Payer: Self-pay

## 2018-10-07 DIAGNOSIS — E0821 Diabetes mellitus due to underlying condition with diabetic nephropathy: Secondary | ICD-10-CM

## 2018-10-07 DIAGNOSIS — I10 Essential (primary) hypertension: Secondary | ICD-10-CM | POA: Diagnosis not present

## 2018-10-07 DIAGNOSIS — E785 Hyperlipidemia, unspecified: Secondary | ICD-10-CM

## 2018-10-07 MED ORDER — DULOXETINE HCL 30 MG PO CPEP: 30.0000 mg | ORAL_CAPSULE | Freq: Every day | ORAL | 3 refills | Status: DC

## 2018-10-07 NOTE — Assessment & Plan Note (Addendum)
Discussed neuropathy will start Cymbalta 30 mg

## 2018-10-07 NOTE — Assessment & Plan Note (Signed)
The current medical regimen is effective;  continue present plan and medications.  

## 2018-10-07 NOTE — Progress Notes (Signed)
BP 132/67   Pulse 67    Subjective:    Patient ID: Jason Contreras, male    DOB: 1941-04-07, 78 y.o.   MRN: [id number]  HPI: Jason Contreras is a 78 y.o. male  Med check  Telemedicine using audio/video telecommunications for a synchronous communication visit. Today's visit due to COVID-19 isolation precautions I connected with and verified that I am speaking with the correct person using two identifiers.   I discussed the limitations, risks, security and privacy concerns of performing an evaluation and management service by telecommunication and the availability of in person appointments. I also discussed with the patient that there may be a patient responsible charge related to this service. The patient expressed understanding and agreed to proceed. The patient's location is home. I am at home.  Reviewed with patient has developed diabetic peripheral neuropathy in his toes wants to have something more than gabapentin which is previously been doing well may be related to worsening diabetes as patient's been more sedentary this spring and is eating more noticed that his weight is up.  Discussed Cymbalta as that worked well in the past. Reviewed renal cell adenocarcinoma reports and CT scan patient concerned having some kidney issues with pain recommended patient go back to follow-up with his surgeon to have this evaluated again. Other issues are all stable cholesterol and blood pressure.   Relevant past medical, surgical, family and social history reviewed and updated as indicated. Interim medical history since our last visit reviewed. Allergies and medications reviewed and updated.  Review of Systems  Constitutional: Negative.   Respiratory: Negative.   Cardiovascular: Negative.     Per HPI unless specifically indicated above     Objective:    BP 132/67   Pulse 67   Wt Readings from Last 3 Encounters:  06/16/18 227 lb 6.4 oz (103.1 kg)  04/15/18 227 lb 6.4 oz (103.1 kg)   03/12/18 227 lb (103 kg)    Physical Exam  Results for orders placed or performed in visit on 04/15/18  Microscopic Examination  Result Value Ref Range   WBC, UA None seen 0 - 5 /hpf   RBC, UA None seen 0 - 2 /hpf   Epithelial Cells (non renal) 0-10 0 - 10 /hpf   Crystals Present (A) N/A   Crystal Type Amorphous Sediment N/A   Bacteria, UA None seen None seen/Few  Urinalysis, Routine w reflex microscopic  Result Value Ref Range   Specific Gravity, UA 1.025 1.005 - 1.030   pH, UA 5.5 5.0 - 7.5   Color, UA Yellow Yellow   Appearance Ur Clear Clear   Leukocytes, UA Negative Negative   Protein, UA Negative Negative/Trace   Glucose, UA Negative Negative   Ketones, UA Trace (A) Negative   RBC, UA Negative Negative   Bilirubin, UA Negative Negative   Urobilinogen, Ur 2.0 (H) 0.2 - 1.0 mg/dL   Nitrite, UA Negative Negative   Microscopic Examination See below:       Assessment & Plan:   Problem List Items Addressed This Visit      Cardiovascular and Mediastinum   Hypertension    The current medical regimen is effective;  continue present plan and medications.       Relevant Orders   Basic metabolic panel     Endocrine   DM due to underlying condition with diabetic nephropathy ([place])    Discussed neuropathy will start Cymbalta 30 mg       Relevant Medications  DULoxetine (CYMBALTA) 30 MG capsule   Other Relevant Orders   Bayer DCA Hb A1c Waived     Other   Hyperlipidemia    The current medical regimen is effective;  continue present plan and medications.       Relevant Orders   LP+ALT+AST [place], [place]     I discussed the assessment and treatment plan with the patient. The patient was provided an opportunity to ask questions and all were answered. The patient agreed with the plan and demonstrated an understanding of the instructions.   The patient was advised to call back or seek an in-person evaluation if the symptoms worsen or if the condition fails to  improve as anticipated.   I provided 21+ minutes of time during this encounter.  Follow up plan: Return in about 6 months (around 04/08/2019) for Hemoglobin A1c, Physical Exam.

## 2018-10-08 ENCOUNTER — Other Ambulatory Visit: Payer: Self-pay

## 2018-10-08 ENCOUNTER — Other Ambulatory Visit: Payer: Medicare Other

## 2018-10-08 DIAGNOSIS — R972 Elevated prostate specific antigen [PSA]: Secondary | ICD-10-CM

## 2018-10-09 LAB — PSA: Prostate Specific Ag, Serum: 5.9 ng/mL — ABNORMAL HIGH (ref 0.0–4.0)

## 2018-10-13 ENCOUNTER — Other Ambulatory Visit: Payer: Self-pay

## 2018-10-13 ENCOUNTER — Other Ambulatory Visit: Payer: Medicare Other

## 2018-10-13 ENCOUNTER — Ambulatory Visit: Payer: Medicare Other | Admitting: Urology

## 2018-10-13 DIAGNOSIS — E0821 Diabetes mellitus due to underlying condition with diabetic nephropathy: Secondary | ICD-10-CM | POA: Diagnosis not present

## 2018-10-13 DIAGNOSIS — E785 Hyperlipidemia, unspecified: Secondary | ICD-10-CM

## 2018-10-13 DIAGNOSIS — I10 Essential (primary) hypertension: Secondary | ICD-10-CM

## 2018-10-13 LAB — BAYER DCA HB A1C WAIVED: HB A1C (BAYER DCA - WAIVED): 5.6 % (ref <7.0)

## 2018-10-13 LAB — LP+ALT+AST PICCOLO, WAIVED
ALT (SGPT) Piccolo, Waived: 19 U/L (ref 10–47)
AST (SGOT) Piccolo, Waived: 16 U/L (ref 11–38)
Chol/HDL Ratio Piccolo,Waive: 2.9 mg/dL
Cholesterol Piccolo, Waived: 101 mg/dL (ref <200)
HDL Chol Piccolo, Waived: 35 mg/dL — ABNORMAL LOW (ref >59)
LDL Chol Calc Piccolo Waived: 47 mg/dL (ref <100)
Triglycerides Piccolo,Waived: 91 mg/dL (ref <150)
VLDL Chol Calc Piccolo,Waive: 18 mg/dL (ref <30)

## 2018-10-14 LAB — BASIC METABOLIC PANEL
BUN/Creatinine Ratio: 13 (ref 10–24)
BUN: 13 mg/dL (ref 8–27)
CO2: 25 mmol/L (ref 20–29)
Calcium: 9.4 mg/dL (ref 8.6–10.2)
Chloride: 101 mmol/L (ref 96–106)
Creatinine, Ser: 1.01 mg/dL (ref 0.76–1.27)
GFR calc Af Amer: 84 mL/min/{1.73_m2} (ref >59)
GFR calc non Af Amer: 72 mL/min/{1.73_m2} (ref >59)
Glucose: 101 mg/dL — ABNORMAL HIGH (ref 65–99)
Potassium: 4.9 mmol/L (ref 3.5–5.2)
Sodium: 141 mmol/L (ref 134–144)

## 2018-10-15 NOTE — Progress Notes (Signed)
07/10/2017   Jason Contreras November 25, 1940 [id number]  Referring provider: [name], MD [address] [place],   [address]  Chief Complaint  Patient presents with  . Benign Prostatic Hypertrophy    HPI: [name] is a 78 year old male with a history of elevated PSA s/p recent negative prostate biopsy, a right renal mass s/p right partial nephrectomy 12/2015 who presents today for follow up.    Elevated PSA Patient was found to have a PSA of 4.5 ng/mL on 10/19/2013. A 4K score conducted on 12/15/2013 noted a 22% probability of having a Gleason's 7 or higher prostate cancer. PSA was 3.9 ng/mL on 12/29/2014, 5.8 on 06/26/15 and 6.0 on 06/30/15.  Repeat PSA on 09/29/15 5.6. He is found to have an enlarged prostate on DRE, but no nodules have been palpated.    He has a family history of PCa, with father having been diagnosed with PCa with metastatic diease in his 63s.    S/p negative biopsy on 10/09/15 which had 2 foci of HG PIN, otherwise no malignancy.  TRUS volume 41 cc.    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    Most recent imaging on 06/19/2017 shows no local recurrence of the bed of his previous renal mass on the rise.  There is however, a new lesion measuring 2.0 x 1.7 cm which in retrospect it was in fact present from a year ago previously measuring 1.4 x 1.2.  It is quite occult in nature.  It was negative for any evidence of metastatic disease.  Underwent cryoablation and bx of the lesion on 10/17/2017.  Pathology was positive for  clear cell renal cell carcinoma, nuclear grade type II.   No recurrence seen on 02/2018 CT.    BPH WITH LUTS  (prostate and/or bladder) IPSS score: 14/3    Previous score: 11/2   Previous PVR: 127 mL   Major complaint(s):  Frequency x several years. Denies any dysuria, hematuria or  suprapubic pain.   Currently taking: tamsulosin 0.4 mg daily   Denies any recent fevers, chills, nausea or vomiting.  IPSS    Row Name 10/16/18 1500         International Prostate Symptom Score   How often have you had the sensation of not emptying your bladder?  About half the time     How often have you had to urinate less than every two hours?  More than half the time     How often have you found you stopped and started again several times when you urinated?  More than half the time     How often have you found it difficult to postpone urination?  Not at All     How often have you had a weak urinary stream?  Not at All     How often have you had to strain to start urination?  Not at All     How many times did you typically get up at night to urinate?  3 Times     Total IPSS Score  14       Quality of Life due to urinary symptoms   If you were to spend the rest of your life with your urinary condition just the way it is now how would you feel about that?  Mixed  Score:  1-7 Mild 8-19 Moderate 20-35 Severe    PMH: Past Medical History:  Diagnosis Date  . Adenocarcinoma, renal cell ([place])   . Arthritis   . Diabetes mellitus without complication ([place])   . Elevated PSA   . Headache   . Hematuria   . Hyperlipidemia   . Hypertension   . Neuropathy   . Obesity   . PONV (postoperative nausea and vomiting)   . Renal insufficiency   . Right renal mass     Surgical History: Past Surgical History:  Procedure Laterality Date  . APPENDECTOMY    . CRYOABLATION  10/17/2017  . IR RADIOLOGIST EVAL & MGMT  07/17/2017  . IR RADIOLOGIST EVAL & MGMT  08/13/2017  . IR RADIOLOGIST EVAL & MGMT  11/12/2017  . IR RADIOLOGIST EVAL & MGMT  03/05/2018  . RADIOLOGY WITH ANESTHESIA Right 10/17/2017   Procedure: CT WITH ANESTHESIA RENAL CRYOABLATION;  Surgeon: [name], MD;  Location: [facility];  Service: Anesthesiology;  Laterality: Right;  . ROBOTIC ASSITED PARTIAL NEPHRECTOMY  Right 12/12/2015   Procedure: ROBOTIC ASSITED PARTIAL NEPHRECTOMY;  Surgeon: [name], MD;  Location: [facility] OR[facility];  Service: Urology;  Laterality: Right;    Home Medications:  Allergies as of 10/16/2018      Reactions   Morphine And Related Nausea And Vomiting      Medication List       Accurate as of October 16, 2018 11:59 PM. If you have any questions, ask your nurse or doctor.        acetaminophen 325 MG tablet Commonly known as: TYLENOL Take 650 mg by mouth daily as needed for headache.   atorvastatin 20 MG tablet Commonly known as: LIPITOR Take 1 tablet (20 mg total) by mouth daily at 6 PM.   carvedilol 6.25 MG tablet Commonly known as: COREG Take 1 tablet (6.25 mg total) by mouth 2 (two) times daily.   DULoxetine 30 MG capsule Commonly known as: CYMBALTA Take 1 capsule (30 mg total) by mouth daily.   gabapentin 300 MG capsule Commonly known as: NEURONTIN Take 1 capsule (300 mg total) by mouth 4 (four) times daily.   glimepiride 4 MG tablet Commonly known as: AMARYL Take 0.5 tablets (2 mg total) by mouth daily.   lidocaine 5 % ointment Commonly known as: XYLOCAINE Apply 1 application topically 3 (three) times daily as needed.   losartan 100 MG tablet Commonly known as: COZAAR Take 1 tablet (100 mg total) by mouth daily.   metFORMIN 500 MG tablet Commonly known as: GLUCOPHAGE Take 2 tablets (1,000 mg total) by mouth 2 (two) times daily.   mometasone 0.1 % cream Commonly known as: ELOCON APPLY  CREAM TOPICALLY ONCE DAILY   pioglitazone 45 MG tablet Commonly known as: ACTOS Take 1 tablet (45 mg total) by mouth daily.   tamsulosin 0.4 MG Caps capsule Commonly known as: Flomax Take 1 capsule (0.4 mg total) by mouth daily.       Allergies:  Allergies  Allergen Reactions  . Morphine And Related Nausea And Vomiting    Family History: Family History  Problem Relation Age of Onset  . Bone cancer Mother   . Stroke Brother   . Kidney disease Neg Hx      Social History:  reports that he has never smoked. He has never used smokeless tobacco. He reports that he does not drink alcohol or use drugs.   Physical Exam: BP (!) 168/78   Pulse 92   Ht 5'  8.62" (1.743 m)   Wt 224 lb (101.6 kg)   BMI 33.45 kg/m   Constitutional:  Well nourished. Alert and oriented, No acute distress. HEENT: [place] AT, moist mucus membranes.  Trachea midline, no masses. Cardiovascular: No clubbing, cyanosis, or edema. Respiratory: Normal respiratory effort, no increased work of breathing. GI: Abdomen is soft, non tender, non distended, no abdominal masses. Liver and spleen not palpable.  No hernias appreciated.  Stool sample for occult testing is not indicated.   GU: No CVA tenderness.  No bladder fullness or masses.  Patient with uncircumcised phallus. Foreskin easily retracted  Urethral meatus is patent.  No penile discharge. No penile lesions or rashes. Scrotum without lesions, cysts, rashes and/or edema.  Testicles are located scrotally bilaterally. No masses are appreciated in the testicles. Left and right epididymis are normal. Rectal: Patient with  normal sphincter tone. Anus and perineum without scarring or rashes. No rectal masses are appreciated. Prostate is approximately 60 grams, could only palpate apex and midportion of the glands, no nodules are appreciated. Skin: No rashes, bruises or suspicious lesions. Lymph: No inguinal adenopathy. Neurologic: Grossly intact, no focal deficits, moving all 4 extremities. Psychiatric: Normal mood and affect.   Laboratory Data: Lab Results  Component Value Date   WBC 4.5 03/12/2018   HGB 13.5 03/12/2018   HCT 39.8 03/12/2018   MCV 95 03/12/2018   PLT 218 03/12/2018    Lab Results  Component Value Date   CREATININE 1.01 10/13/2018    PSA history:      4.5 ng/dL on 10/19/2013.      4K score of 22% on 12/15/2013      3.9 ng/mL on 06/28/2014      3.9 ng/mL on 12/29/2014      5.8 ng/Dl on 06/26/15       6.0  ng/dL on 06/30/15       5.6 ng/dL on 09/29/15      5.1 on 07/2016      4.8 on 01/2017      6.8 in 07/2017      5.3 in 01/2018       5.9 in 10/2018 I have reviewed the labs.  ssment & Plan:    1. History of right RCC:     pT1a RCC s/p right robotic partial nephrectomy on 12/12/15 and s/p cryoablation and bx in 10/2017 No recurrence on 02/2018 CT Patient would like to continue surveillance CT's with [place] as he does not want to travel to [place] Will obtain a CT and contact patient with results   2. Elevated PSA:      History of elevated up to PSA 5.6 s/p negative biopsy 10/2015 (HgPIN) Most recent PSA 5.9  10/2018 PSA is relatively stable Will RTC in 6 months for PSA and exam  3. BPH with LUTS IPSS score is 14/3, it is worsening Continue conservative management, avoiding bladder irritants and timed voiding's Most bothersome symptoms is/are frequency, but not bothersome enough to patient to pursue other treatments at this time Continue tamsulosin 0.4 mg daily RTC in 6 months for IPSS, PSA, PVR and exam   Return for return for CT report virtual or in person is fine .   Zara Council, PA-C  [facility] [address], [place], [place] [address] [phone]

## 2018-10-16 ENCOUNTER — Ambulatory Visit: Payer: Medicare Other | Admitting: Urology

## 2018-10-16 ENCOUNTER — Other Ambulatory Visit: Payer: Self-pay

## 2018-10-16 ENCOUNTER — Encounter: Payer: Self-pay | Admitting: Urology

## 2018-10-16 VITALS — BP 168/78 | HR 92 | Ht 68.62 in | Wt 224.0 lb

## 2018-10-16 DIAGNOSIS — R972 Elevated prostate specific antigen [PSA]: Secondary | ICD-10-CM

## 2018-10-16 DIAGNOSIS — C641 Malignant neoplasm of right kidney, except renal pelvis: Secondary | ICD-10-CM

## 2018-10-16 DIAGNOSIS — N138 Other obstructive and reflux uropathy: Secondary | ICD-10-CM | POA: Diagnosis not present

## 2018-10-16 DIAGNOSIS — N401 Enlarged prostate with lower urinary tract symptoms: Secondary | ICD-10-CM

## 2018-10-18 ENCOUNTER — Encounter: Payer: Self-pay | Admitting: Family Medicine

## 2018-10-23 ENCOUNTER — Other Ambulatory Visit: Payer: Self-pay

## 2018-10-23 ENCOUNTER — Ambulatory Visit
Admission: RE | Admit: 2018-10-23 | Discharge: 2018-10-23 | Disposition: A | Discharge status: Home / Self Care | Payer: Medicare Other | Source: Ambulatory Visit | Attending: Urology | Admitting: Urology

## 2018-10-23 DIAGNOSIS — K5730 Diverticulosis of large intestine without perforation or abscess without bleeding: Secondary | ICD-10-CM | POA: Diagnosis not present

## 2018-10-23 DIAGNOSIS — C641 Malignant neoplasm of right kidney, except renal pelvis: Secondary | ICD-10-CM | POA: Diagnosis not present

## 2018-10-23 MED ORDER — IOHEXOL 300 MG/ML  SOLN
100.0000 mL | Freq: Once | INTRAMUSCULAR | Status: AC | PRN
  Administered 2018-10-23: 100 mL via INTRAVENOUS

## 2018-10-29 ENCOUNTER — Telehealth (INDEPENDENT_AMBULATORY_CARE_PROVIDER_SITE_OTHER): Payer: Medicare Other | Admitting: Urology

## 2018-10-29 ENCOUNTER — Other Ambulatory Visit: Payer: Self-pay

## 2018-10-29 ENCOUNTER — Telehealth: Payer: Self-pay | Admitting: Urology

## 2018-10-29 DIAGNOSIS — M25551 Pain in right hip: Secondary | ICD-10-CM

## 2018-10-29 DIAGNOSIS — Z85528 Personal history of other malignant neoplasm of kidney: Secondary | ICD-10-CM

## 2018-10-29 DIAGNOSIS — G8929 Other chronic pain: Secondary | ICD-10-CM

## 2018-10-29 DIAGNOSIS — N138 Other obstructive and reflux uropathy: Secondary | ICD-10-CM

## 2018-10-29 NOTE — Telephone Encounter (Signed)
Would you call Jason Contreras and schedule him for an office visit in one year for CT scan report, PSA, I PSS and exam?  PSA and CT prior to the appointment.

## 2018-10-29 NOTE — Progress Notes (Signed)
Virtual Visit via Telephone Note  I connected with Jason Contreras on 10/29/18 at 11:30 AM EDT by telephone and verified that I am speaking with the correct person using two identifiers.  Location: Patient: Home Provider: Home   I discussed the limitations, risks, security and privacy concerns of performing an evaluation and management service by telephone and the availability of in person appointments. I also discussed with the patient that there may be a patient responsible charge related to this service. The patient expressed understanding and agreed to proceed.   History of Present Illness: [name] is a 78 year old male with a history of RCC of the right kidney (robotic partial nephrectomy on 12/12/2015 and cryoablation of a right renal lesion on 10/17/2017), elevated PSA and BPH with LU TS who is contacted by telephone to review his CT scan results.    CT of the abdomen w & wo on 10/23/2018 noted no findings of recurrence along the right posterior mid kidney cryoablation site, or along the right anterior mid kidney partial nephrectomy site. No adenopathy or other worrisome findings.   Other imaging findings of potential clinical significance: Aortic Atherosclerosis (ICD10-I70.0). Coronary atherosclerosis. Colonic diverticulosis. Multilevel lower lumbar impingement.  He continues to have right sided pain.    Observations/Objective: CLINICAL DATA:  Right adenocarcinoma of the kidney with partial nephrectomy on 12/12/2015. Cryoablation of the right kidney 10/17/2017. Right flank intermittent cramping.  EXAM: CT ABDOMEN WITHOUT AND WITH CONTRAST  TECHNIQUE: Multidetector CT imaging of the abdomen was performed following the standard protocol before and following the bolus administration of intravenous contrast.  CONTRAST:  142mL OMNIPAQUE IOHEXOL 300 MG/ML  SOLN  COMPARISON:  Multiple exams, including 03/02/2018  FINDINGS: Lower chest: Left anterior descending, right, and  circumflex coronary artery atherosclerosis with atherosclerotic calcification of the descending thoracic aorta.  Hepatobiliary: Unremarkable.  The gallbladder appears normal.  Pancreas: Unremarkable  Spleen: Unremarkable  Adrenals/Urinary Tract: Both adrenal glands appear normal.  Prior partial nephrectomy site anteriorly in the right mid kidney without complicating feature. No residual enhancement or findings of recurrence. No renal stones or additional significant renal lesions identified.  Stomach/Bowel: Scattered colonic diverticulosis especially in the lower descending colon. Otherwise unremarkable.  Vascular/Lymphatic: Aortoiliac atherosclerotic vascular disease. No pathologic adenopathy. No thrombus in either renal vein.  Other: No supplemental non-categorized findings.  Musculoskeletal: Lumbar thoracic spondylosis with lumbar degenerative disc disease at multiple levels, and multilevel Schmorl's nodes in the lumbar spine. Congenitally short pedicles in the lumbar spine. These factors cause impingement at L3-4, L4-5, and L5-S1; possible impingement at L2-3 as well.  IMPRESSION: 1. No findings of recurrence along the right posterior mid kidney cryoablation site, or along the right anterior mid kidney partial nephrectomy site. No adenopathy or other worrisome findings. 2. Other imaging findings of potential clinical significance: Aortic Atherosclerosis (ICD10-I70.0). Coronary atherosclerosis. Colonic diverticulosis. Multilevel lower lumbar impingement.   Electronically Signed   By: Van Clines M.D.   On: 10/23/2018 15:30 I have independently reviewed the films and do not see evidence of recurrence.     Assessment and Plan:  1. History of RCC of the right kidney No evidence of recurrence of RCC seen on recent CT - will get chest xray  He will RTC in 6 months to have surveillance CT  2. Right sided pain Multilevel lower lumbar impingement  seen on recent CT  Refer to orthopedics for further management.   Follow Up Instructions:  [name] will follow up in 6 months for PSA,  contrast CT, I PSS and exam.    I discussed the assessment and treatment plan with the patient. The patient was provided an opportunity to ask questions and all were answered. The patient agreed with the plan and demonstrated an understanding of the instructions.   The patient was advised to call back or seek an in-person evaluation if the symptoms worsen or if the condition fails to improve as anticipated.  I provided 14 minutes of non-face-to-face time during this encounter.   Ozelle Brubacher, PA-C

## 2018-11-09 ENCOUNTER — Other Ambulatory Visit: Payer: Self-pay | Admitting: Urology

## 2018-11-09 DIAGNOSIS — Z85528 Personal history of other malignant neoplasm of kidney: Secondary | ICD-10-CM

## 2018-11-12 ENCOUNTER — Ambulatory Visit
Admission: RE | Admit: 2018-11-12 | Discharge: 2018-11-12 | Disposition: A | Discharge status: Home / Self Care | Payer: Medicare Other | Source: Ambulatory Visit | Attending: Urology | Admitting: Urology

## 2018-11-12 ENCOUNTER — Ambulatory Visit
Admission: RE | Admit: 2018-11-12 | Discharge: 2018-11-12 | Disposition: A | Discharge status: Home / Self Care | Payer: Medicare Other | Attending: Urology | Admitting: Urology

## 2018-11-12 ENCOUNTER — Other Ambulatory Visit: Payer: Self-pay

## 2018-11-12 DIAGNOSIS — Z85528 Personal history of other malignant neoplasm of kidney: Secondary | ICD-10-CM | POA: Insufficient documentation

## 2018-11-12 DIAGNOSIS — R05 Cough: Secondary | ICD-10-CM | POA: Diagnosis not present

## 2018-11-24 ENCOUNTER — Ambulatory Visit: Payer: Self-pay

## 2018-11-24 ENCOUNTER — Ambulatory Visit (INDEPENDENT_AMBULATORY_CARE_PROVIDER_SITE_OTHER): Payer: Medicare Other | Admitting: Orthopaedic Surgery

## 2018-11-24 ENCOUNTER — Encounter: Payer: Self-pay | Admitting: Orthopaedic Surgery

## 2018-11-24 ENCOUNTER — Telehealth: Payer: Self-pay | Admitting: Urology

## 2018-11-24 DIAGNOSIS — M545 Low back pain, unspecified: Secondary | ICD-10-CM

## 2018-11-24 DIAGNOSIS — M47817 Spondylosis without myelopathy or radiculopathy, lumbosacral region: Secondary | ICD-10-CM | POA: Diagnosis not present

## 2018-11-24 MED ORDER — CYCLOBENZAPRINE HCL 10 MG PO TABS: 10.0000 mg | ORAL_TABLET | Freq: Two times a day (BID) | ORAL | 0 refills | Status: DC | PRN

## 2018-11-24 MED ORDER — PREDNISONE 10 MG (21) PO TBPK: ORAL_TABLET | ORAL | 0 refills | Status: DC

## 2018-11-24 NOTE — Progress Notes (Signed)
Office Visit Note   Patient: Jason Contreras           Date of Birth: May 08, 1940           MRN: [id number] Visit Date: 11/24/2018              Requested by: Nori Riis, PA-C [address] [place],  [place] [id number] PCP: [name], MD   Assessment & Plan: Visit Diagnoses:  1. DJD (degenerative joint disease), lumbosacral   2. Low back pain, unspecified back pain laterality, unspecified chronicity, unspecified whether sciatica present     Plan: Impression is lumbar go.  Will start the patient on a Sterapred taper muscle relaxer.  Have also written a prescription for formal physical therapy and provided him with a home exercise program.  He will follow-up with [place] as needed.  He will call with concerns or questions in the meantime.  Follow-Up Instructions: Return if symptoms worsen or fail to improve.   Orders:  Orders Placed This Encounter  Procedures  . XR Lumbar Spine 2-3 Views   Meds ordered this encounter  Medications  . predniSONE (STERAPRED UNI-PAK 21 TAB) 10 MG (21) TBPK tablet    Sig: Take as directed    Dispense:  21 tablet    Refill:  0  . cyclobenzaprine (FLEXERIL) 10 MG tablet    Sig: Take 1 tablet (10 mg total) by mouth 2 (two) times daily as needed for muscle spasms.    Dispense:  20 tablet    Refill:  0      Procedures: No procedures performed   Clinical Data: No additional findings.   Subjective: Chief Complaint  Patient presents with  . Lower Back - Pain    HPI patient is a pleasant 78 year old gentleman who presents our clinic today with left lower back pain.  This has been ongoing for the past 3 to 4 years.  He has not really noticed any change in pain, but although notes increased fatigue with ambulation.  The pain he has is to the left lower back.  No radiation down the leg.  No pain into the groin.  He notes weakness after walking for longer periods of time then to his mailbox and back.  He also has pain going  from a seated to standing position.  He has a history of diabetic peripheral neuropathy and has been on gabapentin and Cymbalta for over a decade for this.  He denies any new numbness, tingling or burning.  He does note a history of kidney cancer as well.  Review of Systems as detailed in HPI.  All others reviewed and are negative.   Objective: Vital Signs: There were no vitals taken for this visit.  Physical Exam well-developed well-nourished gentleman in no acute distress.  Alert and oriented x3.  Ortho Exam examination of his lumbar spine reveals no spinous tenderness.  He does have slight left-sided para spinous tenderness.  Increased pain with lumbar flexion.  He has a positive straight leg raise.  Negative logroll negative Fater.  No focal weakness.  He is neurovascular intact distally.  Specialty Comments:  No specialty comments available.  Imaging: Xr Lumbar Spine 2-3 Views  Result Date: 11/24/2018 X-rays demonstrate diffuse degenerative disc disease worse at L5-S1 with multilevel anterior spurring.  No other acute findings    PMFS History: Patient Active Problem List   Diagnosis Date Noted  . DJD (degenerative joint disease), lumbosacral 11/04/2017  . Renal cell  cancer, right ([place]) 10/17/2017  . Advanced care planning/counseling discussion 01/13/2017  . Arthritis 01/13/2017  . PE (physical exam), annual 01/13/2017  . Senile purpura ([place]) 09/26/2016  . Fatigue 09/26/2016  . Adenocarcinoma, renal cell ([place]) 12/27/2015  . Elevated PSA 01/07/2015  . BPH with obstruction/lower urinary tract symptoms 01/07/2015  . DM due to underlying condition with diabetic nephropathy ([place])   . Hypertension   . Hyperlipidemia    Past Medical History:  Diagnosis Date  . Adenocarcinoma, renal cell ([place])   . Arthritis   . Diabetes mellitus without complication ([place])   . Elevated PSA   . Headache   . Hematuria   . Hyperlipidemia   . Hypertension   . Neuropathy   . Obesity   . PONV  (postoperative nausea and vomiting)   . Renal insufficiency   . Right renal mass     Family History  Problem Relation Age of Onset  . Bone cancer Mother   . Stroke Brother   . Kidney disease Neg Hx     Past Surgical History:  Procedure Laterality Date  . APPENDECTOMY    . CRYOABLATION  10/17/2017  . IR RADIOLOGIST EVAL & MGMT  07/17/2017  . IR RADIOLOGIST EVAL & MGMT  08/13/2017  . IR RADIOLOGIST EVAL & MGMT  11/12/2017  . IR RADIOLOGIST EVAL & MGMT  03/05/2018  . RADIOLOGY WITH ANESTHESIA Right 10/17/2017   Procedure: CT WITH ANESTHESIA RENAL CRYOABLATION;  Surgeon: [name], MD;  Location: [facility];  Service: Anesthesiology;  Laterality: Right;  . ROBOTIC ASSITED PARTIAL NEPHRECTOMY Right 12/12/2015   Procedure: ROBOTIC ASSITED PARTIAL NEPHRECTOMY;  Surgeon: [name], MD;  Location: [facility] OR[facility];  Service: Urology;  Laterality: Right;   Social History   Occupational History  . Not on file  Tobacco Use  . Smoking status: Never Smoker  . Smokeless tobacco: Never Used  Substance and Sexual Activity  . Alcohol use: No  . Drug use: No  . Sexual activity: Not on file

## 2019-01-21 ENCOUNTER — Ambulatory Visit (INDEPENDENT_AMBULATORY_CARE_PROVIDER_SITE_OTHER): Payer: Medicare Other

## 2019-01-21 VITALS — BP 140/74 | HR 62

## 2019-01-21 DIAGNOSIS — Z0000 Encounter for general adult medical examination without abnormal findings: Secondary | ICD-10-CM | POA: Diagnosis not present

## 2019-01-21 NOTE — Progress Notes (Signed)
Subjective:   Jason Contreras is a 78 y.o. male who presents for Medicare Annual/Subsequent preventive examination.  This visit is being conducted via phone call  - after an attmept to do on video chat - due to the COVID-19 pandemic. This patient has given me verbal consent via phone to conduct this visit, patient states they are participating from their home address. Some vital signs may be absent or patient reported.   Patient identification: identified by name, DOB, and current address.    Review of Systems:   Cardiac Risk Factors include: advanced age (>77men, >44 women);male gender;dyslipidemia;diabetes mellitus;hypertension     Objective:    Vitals: BP 140/74 Comment: pt reported   Pulse 62 Comment: pt reported  There is no height or weight on file to calculate BMI.  Advanced Directives 01/21/2019 01/08/2018 10/17/2017 10/17/2017 10/14/2017 01/01/2017 12/12/2015  Does Patient Have a Medical Advance Directive? Yes Yes No No Yes Yes No  Type of Advance Directive Living will;Healthcare Power of [place];Living will - - - [place];Living will -  Copy of [place] in Chart? No - copy requested No - copy requested - - - No - copy requested -  Would patient like information on creating a medical advance directive? - - No - Patient declined No - Patient declined - - No - patient declined information    Tobacco Social History   Tobacco Use  Smoking Status Never Smoker  Smokeless Tobacco Never Used     Counseling given: Not Answered   Clinical Intake:  Pre-visit preparation completed: Yes  Pain : No/denies pain     Nutritional Risks: None Diabetes: Yes CBG done?: No Did pt. bring in CBG monitor from home?: No  How often do you need to have someone help you when you read instructions, pamphlets, or other written materials from your doctor or pharmacy?: 1 - Never  Nutrition Risk Assessment:  Has the patient had  any N/V/D within the last 2 months?  No  Does the patient have any non-healing wounds?  No  Has the patient had any unintentional weight loss or weight gain?  No   Diabetes:  Is the patient diabetic?  Yes  If diabetic, was a CBG obtained today?  No  Did the patient bring in their glucometer from home?  No  How often do you monitor your CBG's? Doesn't check.   Financial Strains and Diabetes Management:  Are you having any financial strains with the device, your supplies or your medication? No .  Does the patient want to be seen by Chronic Care Management for management of their diabetes?  No  Would the patient like to be referred to a Nutritionist or for Diabetic Management?  No   Diabetic Exams:  Diabetic Eye Exam:  Overdue for diabetic eye exam. Pt has been advised about the importance in completing this exam.   Diabetic Foot Exam: Pt has been advised about the importance in completing this exam.  Interpreter Needed?: No  Information entered by :: Leahmarie Gasiorowski,LPN  Past Medical History:  Diagnosis Date   Adenocarcinoma, renal cell ([place])    Arthritis    Diabetes mellitus without complication ([facility])    Elevated PSA    Headache    Hematuria    Hyperlipidemia    Hypertension    Neuropathy    Obesity    PONV (postoperative nausea and vomiting)    Renal insufficiency    Right renal  mass    Past Surgical History:  Procedure Laterality Date   APPENDECTOMY     CRYOABLATION  10/17/2017   IR RADIOLOGIST EVAL & MGMT  07/17/2017   IR RADIOLOGIST EVAL & MGMT  08/13/2017   IR RADIOLOGIST EVAL & MGMT  11/12/2017   IR RADIOLOGIST EVAL & MGMT  03/05/2018   RADIOLOGY WITH ANESTHESIA Right 10/17/2017   Procedure: CT WITH ANESTHESIA RENAL CRYOABLATION;  Surgeon: [name], MD;  [facility]: [facility];  Service: Anesthesiology;  Laterality: Right;   ROBOTIC ASSITED PARTIAL NEPHRECTOMY Right 12/12/2015   Procedure: ROBOTIC ASSITED PARTIAL NEPHRECTOMY;  Surgeon: [name], MD;  Location: [facility];  Service: Urology;  Laterality: Right;   Family History  Problem Relation Age of Onset   Bone cancer Mother    Cancer Mother    Cancer Father    Stroke Brother    Kidney disease Neg Hx    Social History   Socioeconomic History   Marital status: Married    Spouse name: Not on file   Number of children: Not on file   Years of education: 3 years college    Highest education level: Some college, no degree  Occupational History   Not on file  Social Needs   Financial resource strain: Not hard at all   Food insecurity    Worry: Never true    Inability: Never true   Transportation needs    Medical: No    Non-medical: No  Tobacco Use   Smoking status: Never Smoker   Smokeless tobacco: Never Used  Substance and Sexual Activity   Alcohol use: No   Drug use: No   Sexual activity: Never  Lifestyle   Physical activity    Days per week: 0 days    Minutes per session: 0 min   Stress: Not at all  Relationships   Social connections    Talks on phone: More than three times a week    Gets together: More than three times a week    Attends religious service: More than 4 times per year    Active member of club or organization: No    Attends meetings of clubs or organizations: Never    Relationship status: Married  Other Topics Concern   Not on file  Social History Narrative   Not on file    Outpatient Encounter Medications as of 01/21/2019  Medication Sig   acetaminophen (TYLENOL) 325 MG tablet Take 650 mg by mouth daily as needed for headache.   atorvastatin (LIPITOR) 20 MG tablet Take 1 tablet (20 mg total) by mouth daily at 6 PM.   carvedilol (COREG) 6.25 MG tablet Take 1 tablet (6.25 mg total) by mouth 2 (two) times daily.   cyclobenzaprine (FLEXERIL) 10 MG tablet Take 1 tablet (10 mg total) by mouth 2 (two) times daily as needed for muscle spasms.   DULoxetine (CYMBALTA) 30 MG capsule Take 1 capsule (30 mg total)  by mouth daily.   gabapentin (NEURONTIN) 300 MG capsule Take 1 capsule (300 mg total) by mouth 4 (four) times daily.   glimepiride (AMARYL) 4 MG tablet Take 0.5 tablets (2 mg total) by mouth daily.   lidocaine (XYLOCAINE) 5 % ointment Apply 1 application topically 3 (three) times daily as needed.   losartan (COZAAR) 100 MG tablet Take 1 tablet (100 mg total) by mouth daily.   metFORMIN (GLUCOPHAGE) 500 MG tablet Take 2 tablets (1,000 mg total) by mouth 2 (two) times daily.   mometasone (ELOCON)  0.1 % cream APPLY  CREAM TOPICALLY ONCE DAILY   pioglitazone (ACTOS) 45 MG tablet Take 1 tablet (45 mg total) by mouth daily.   predniSONE (STERAPRED UNI-PAK 21 TAB) 10 MG (21) TBPK tablet Take as directed   tamsulosin (FLOMAX) 0.4 MG CAPS capsule Take 1 capsule (0.4 mg total) by mouth daily.   No facility-administered encounter medications on file as of 01/21/2019.     Activities of Daily Living In your present state of health, do you have any difficulty performing the following activities: 01/21/2019  Hearing? N  Comment no hearing aids  Vision? N  Comment eyeglasses, goes to [facility]  Difficulty concentrating or making decisions? N  Walking or climbing stairs? Y  Comment avoids if possible due to past kidney sx  Dressing or bathing? N  Doing errands, shopping? N  Preparing Food and eating ? N  Using the Toilet? N  In the past six months, have you accidently leaked urine? N  Do you have problems with loss of bowel control? N  Managing your Medications? N  Managing your Finances? N  Housekeeping or managing your Housekeeping? N  Some recent data might be hidden    Patient Care Team: [name], MD as PCP - General (Family Medicine) [name], MD as Consulting Physician (Urology) [name], MD as Consulting Physician (Interventional Radiology)   Assessment:   This is a routine wellness examination for [facility].  Exercise Activities and Dietary  recommendations Current Exercise Habits: The patient does not participate in regular exercise at present, Exercise limited by: None identified  Goals     Increase water intake     Recommend drinking at least 6-8 glasses of water a day       Fall Risk Fall Risk  01/21/2019 01/08/2018 11/04/2017 01/01/2017 09/26/2016  Falls in the past year? 0 No No No No  Number falls in past yr: - - - - -  Injury with Fall? - - - - -   [place]:  Any stairs in or around the home? Yes  If so, are there any without handrails? No   Home free of loose throw rugs in walkways, pet beds, electrical cords, etc? Yes  Adequate lighting in your home to reduce risk of falls? Yes   ASSISTIVE DEVICES UTILIZED TO PREVENT FALLS:  Life alert? No  Use of a cane, walker or w/c? No  Grab bars in the bathroom? Yes  Shower chair or bench in shower? Yes  Elevated toilet seat or a handicapped toilet? No    TIMED UP AND GO:  Unable to perform    Depression Screen PHQ 2/9 Scores 01/21/2019 01/08/2018 11/04/2017 01/01/2017  PHQ - 2 Score 0 0 0 0    Cognitive Function     6CIT Screen 01/08/2018 01/01/2017  What Year? 0 points 0 points  What month? 0 points 0 points  What time? 0 points 0 points  Count back from 20 0 points 0 points  Months in reverse 0 points 0 points  Repeat phrase 0 points 0 points  Total Score 0 0    Immunization History  Administered Date(s) Administered   Influenza, High Dose Seasonal PF 03/25/2016, 01/13/2017, 03/12/2018   Influenza,inj,Quad PF,6+ Mos 03/13/2015   Influenza-Unspecified 04/18/2014   Pneumococcal Conjugate-13 10/19/2013   Pneumococcal Polysaccharide-23 09/03/1997, 02/26/2005   Td 02/17/2008    Qualifies for Shingles Vaccine? Yes  Zostavax completed n/a. Due for Shingrix. Education has been provided  regarding the importance of this vaccine. Pt has been advised to call insurance company to determine out of pocket expense. Advised may  also receive vaccine at local pharmacy or Health Dept. Verbalized acceptance and understanding.  Tdap: up to date   Flu Vaccine: Due for Flu vaccine.   Pneumococcal Vaccine: up to date   Screening Tests Health Maintenance  Topic Date Due   PNA vac Low Risk Adult (2 of 2 - PPSV23) 10/20/2014   FOOT EXAM  06/26/2016   OPHTHALMOLOGY EXAM  05/03/2017   INFLUENZA VACCINE  12/05/2018   TETANUS/TDAP  04/16/2019 (Originally 02/16/2018)   HEMOGLOBIN A1C  04/14/2019   COLONOSCOPY  Discontinued   Cancer Screenings:  Colorectal Screening: no longer required   Lung Cancer Screening: (Low Dose CT Chest recommended if Age 67-80 years, 30 pack-year currently smoking OR have quit w/in 15years.) does not qualify.   Additional Screening:  Hepatitis C Screening: does not qualify  Dental Screening: Recommended annual dental exams for proper oral hygiene  Community Resource Referral:  CRR required this visit?  No        Plan:  I have personally reviewed and addressed the Medicare Annual Wellness questionnaire and have noted the following in the patients chart:  A. Medical and social history B. Use of alcohol, tobacco or illicit drugs  C. Current medications and supplements D. Functional ability and status E.  Nutritional status F.  Physical activity G. Advance directives H. List of other physicians I.  Hospitalizations, surgeries, and ER visits in previous 12 months J.  [place] such as hearing and vision if needed, cognitive and depression L. Referrals and appointments   In addition, I have reviewed and discussed with patient certain preventive protocols, quality metrics, and best practice recommendations. A written personalized care plan for preventive services as well as general preventive health recommendations were provided to patient.   Signed,   Bevelyn Ngo, LPN  [id number] Nurse Health Advisor  Nurse Notes:  none

## 2019-01-21 NOTE — Patient Instructions (Signed)
[name] , Thank you for taking time to come for your Medicare Wellness Visit. I appreciate your ongoing commitment to your health goals. Please review the following plan we discussed and let me know if I can assist you in the future.   Screening recommendations/referrals: Colonoscopy: no longer required  Recommended yearly ophthalmology/optometry visit for glaucoma screening and checkup Recommended yearly dental visit for hygiene and checkup  Vaccinations: Influenza vaccine: due now Pneumococcal vaccine: pneumococcal 23 due  Tdap vaccine: up to date Shingles vaccine: shingrix eligible     Advanced directives: Please bring a copy of your health care power of attorney and living will to the office at your convenience.  Conditions/risks identified:diabetic, discussed chronic care management team   Next appointment: Follow up in one year for your annual wellness visit   Preventive Care 18 Years and Older, Male Preventive care refers to lifestyle choices and visits with your health care provider that can promote health and wellness. What does preventive care include?  A yearly physical exam. This is also called an annual well check.  Dental exams once or twice a year.  Routine eye exams. Ask your health care provider how often you should have your eyes checked.  Personal lifestyle choices, including:  Daily care of your teeth and gums.  Regular physical activity.  Eating a healthy diet.  Avoiding tobacco and drug use.  Limiting alcohol use.  Practicing safe sex.  Taking low doses of aspirin every day.  Taking vitamin and mineral supplements as recommended by your health care provider. What happens during an annual well check? The services and screenings done by your health care provider during your annual well check will depend on your age, overall health, lifestyle risk factors, and family history of disease. Counseling  Your health care provider may ask you  questions about your:  Alcohol use.  Tobacco use.  Drug use.  Emotional well-being.  Home and relationship well-being.  Sexual activity.  Eating habits.  History of falls.  Memory and ability to understand (cognition).  Work and work Statistician. Screening  You may have the following tests or measurements:  Height, weight, and BMI.  Blood pressure.  Lipid and cholesterol levels. These may be checked every 5 years, or more frequently if you are over 60 years old.  Skin check.  Lung cancer screening. You may have this screening every year starting at age 90 if you have a 30-pack-year history of smoking and currently smoke or have quit within the past 15 years.  Fecal occult blood test (FOBT) of the stool. You may have this test every year starting at age 47.  Flexible sigmoidoscopy or colonoscopy. You may have a sigmoidoscopy every 5 years or a colonoscopy every 10 years starting at age 58.  Prostate cancer screening. Recommendations will vary depending on your family history and other risks.  Hepatitis C blood test.  Hepatitis B blood test.  Sexually transmitted disease (STD) testing.  Diabetes screening. This is done by checking your blood sugar (glucose) after you have not eaten for a while (fasting). You may have this done every 1-3 years.  Abdominal aortic aneurysm (AAA) screening. You may need this if you are a current or former smoker.  Osteoporosis. You may be screened starting at age 72 if you are at high risk. Talk with your health care provider about your test results, treatment options, and if necessary, the need for more tests. Vaccines  Your health care provider may recommend certain vaccines, such  as:  Influenza vaccine. This is recommended every year.  Tetanus, diphtheria, and acellular pertussis (Tdap, Td) vaccine. You may need a Td booster every 10 years.  Zoster vaccine. You may need this after age 27.  Pneumococcal 13-valent conjugate  (PCV13) vaccine. One dose is recommended after age 19.  Pneumococcal polysaccharide (PPSV23) vaccine. One dose is recommended after age 36. Talk to your health care provider about which screenings and vaccines you need and how often you need them. This information is not intended to replace advice given to you by your health care provider. Make sure you discuss any questions you have with your health care provider. Document Released: 05/19/2015 Document Revised: 01/10/2016 Document Reviewed: 02/21/2015 Elsevier Interactive Patient Education  2017 [employer] Prevention in the Home Falls can cause injuries. They can happen to people of all ages. There are many things you can do to make your home safe and to help prevent falls. What can I do on the outside of my home?  Regularly fix the edges of walkways and driveways and fix any cracks.  Remove anything that might make you trip as you walk through a door, such as a raised step or threshold.  Trim any bushes or trees on the path to your home.  Use bright outdoor lighting.  Clear any walking paths of anything that might make someone trip, such as rocks or tools.  Regularly check to see if handrails are loose or broken. Make sure that both sides of any steps have handrails.  Any raised decks and porches should have guardrails on the edges.  Have any leaves, snow, or ice cleared regularly.  Use sand or salt on walking paths during winter.  Clean up any spills in your garage right away. This includes oil or grease spills. What can I do in the bathroom?  Use night lights.  Install grab bars by the toilet and in the tub and shower. Do not use towel bars as grab bars.  Use non-skid mats or decals in the tub or shower.  If you need to sit down in the shower, use a plastic, non-slip stool.  Keep the floor dry. Clean up any water that spills on the floor as soon as it happens.  Remove soap buildup in the tub or shower  regularly.  Attach bath mats securely with double-sided non-slip rug tape.  Do not have throw rugs and other things on the floor that can make you trip. What can I do in the bedroom?  Use night lights.  Make sure that you have a light by your bed that is easy to reach.  Do not use any sheets or blankets that are too big for your bed. They should not hang down onto the floor.  Have a firm chair that has side arms. You can use this for support while you get dressed.  Do not have throw rugs and other things on the floor that can make you trip. What can I do in the kitchen?  Clean up any spills right away.  Avoid walking on wet floors.  Keep items that you use a lot in easy-to-reach places.  If you need to reach something above you, use a strong step stool that has a grab bar.  Keep electrical cords out of the way.  Do not use floor polish or wax that makes floors slippery. If you must use wax, use non-skid floor wax.  Do not have throw rugs and other things on the  floor that can make you trip. What can I do with my stairs?  Do not leave any items on the stairs.  Make sure that there are handrails on both sides of the stairs and use them. Fix handrails that are broken or loose. Make sure that handrails are as long as the stairways.  Check any carpeting to make sure that it is firmly attached to the stairs. Fix any carpet that is loose or worn.  Avoid having throw rugs at the top or bottom of the stairs. If you do have throw rugs, attach them to the floor with carpet tape.  Make sure that you have a light switch at the top of the stairs and the bottom of the stairs. If you do not have them, ask someone to add them for you. What else can I do to help prevent falls?  Wear shoes that:  Do not have high heels.  Have rubber bottoms.  Are comfortable and fit you well.  Are closed at the toe. Do not wear sandals.  If you use a stepladder:  Make sure that it is fully  opened. Do not climb a closed stepladder.  Make sure that both sides of the stepladder are locked into place.  Ask someone to hold it for you, if possible.  Clearly mark and make sure that you can see:  Any grab bars or handrails.  First and last steps.  Where the edge of each step is.  Use tools that help you move around (mobility aids) if they are needed. These include:  Canes.  Walkers.  Scooters.  Crutches.  Turn on the lights when you go into a dark area. Replace any light bulbs as soon as they burn out.  Set up your furniture so you have a clear path. Avoid moving your furniture around.  If any of your floors are uneven, fix them.  If there are any pets around you, be aware of where they are.  Review your medicines with your doctor. Some medicines can make you feel dizzy. This can increase your chance of falling. Ask your doctor what other things that you can do to help prevent falls. This information is not intended to replace advice given to you by your health care provider. Make sure you discuss any questions you have with your health care provider. Document Released: 02/16/2009 Document Revised: 09/28/2015 Document Reviewed: 05/27/2014 Elsevier Interactive Patient Education  2017 [employer].

## 2019-01-25 ENCOUNTER — Ambulatory Visit (INDEPENDENT_AMBULATORY_CARE_PROVIDER_SITE_OTHER): Payer: Medicare Other

## 2019-01-25 ENCOUNTER — Other Ambulatory Visit: Payer: Self-pay

## 2019-01-25 DIAGNOSIS — Z23 Encounter for immunization: Secondary | ICD-10-CM | POA: Diagnosis not present

## 2019-02-16 ENCOUNTER — Other Ambulatory Visit: Payer: Self-pay | Admitting: Family Medicine

## 2019-02-16 DIAGNOSIS — E0821 Diabetes mellitus due to underlying condition with diabetic nephropathy: Secondary | ICD-10-CM

## 2019-02-16 DIAGNOSIS — E785 Hyperlipidemia, unspecified: Secondary | ICD-10-CM

## 2019-02-16 DIAGNOSIS — I10 Essential (primary) hypertension: Secondary | ICD-10-CM

## 2019-04-18 ENCOUNTER — Encounter: Payer: Self-pay | Admitting: Nurse Practitioner

## 2019-04-20 ENCOUNTER — Ambulatory Visit: Payer: Self-pay | Admitting: Family Medicine

## 2019-04-21 ENCOUNTER — Encounter: Payer: Medicare Other | Admitting: Nurse Practitioner

## 2019-05-03 ENCOUNTER — Other Ambulatory Visit (HOSPITAL_COMMUNITY): Payer: Self-pay | Admitting: Interventional Radiology

## 2019-05-03 DIAGNOSIS — C641 Malignant neoplasm of right kidney, except renal pelvis: Secondary | ICD-10-CM

## 2019-05-04 ENCOUNTER — Telehealth: Payer: Self-pay | Admitting: Family Medicine

## 2019-05-04 NOTE — Chronic Care Management (AMB) (Signed)
  Chronic Care Management   Note  05/04/2019 Name: Jason Contreras MRN: [id number] DOB: 1941/02/04  Jason Contreras is a 78 y.o. year old male who is a primary care patient of Crissman, Jeannette How, MD. I reached out to Jason Contreras by phone today in response to a referral sent by Mr. Lovett [employer] health plan.     Jason Contreras was given information about Chronic Care Management services today including:  1. CCM service includes personalized support from designated clinical staff supervised by his physician, including individualized plan of care and coordination with other care providers 2. 24/7 contact phone numbers for assistance for urgent and routine care needs. 3. Service will only be billed when office clinical staff spend 20 minutes or more in a month to coordinate care. 4. Only one practitioner may furnish and bill the service in a calendar month. 5. The patient may stop CCM services at any time (effective at the end of the month) by phone call to the office staff. 6. The patient will be responsible for cost sharing (co-pay) of up to 20% of the service fee (after annual deductible is met).  Patient agreed to services and verbal consent obtained.   Follow up plan: Telephone appointment with CCM team member scheduled for: 06/08/2019  [facility] [place] [facility]  [place], [place] [address] Direct Dial: [email or web address]  Website: [email or web address]

## 2019-05-05 ENCOUNTER — Ambulatory Visit: Payer: Self-pay

## 2019-05-05 ENCOUNTER — Other Ambulatory Visit: Payer: Self-pay

## 2019-05-05 ENCOUNTER — Encounter: Payer: Self-pay | Admitting: Physician Assistant

## 2019-05-05 ENCOUNTER — Ambulatory Visit (INDEPENDENT_AMBULATORY_CARE_PROVIDER_SITE_OTHER): Payer: Medicare Other | Admitting: Orthopaedic Surgery

## 2019-05-05 ENCOUNTER — Ambulatory Visit (INDEPENDENT_AMBULATORY_CARE_PROVIDER_SITE_OTHER): Payer: Medicare Other

## 2019-05-05 DIAGNOSIS — M25552 Pain in left hip: Secondary | ICD-10-CM

## 2019-05-05 DIAGNOSIS — M25562 Pain in left knee: Secondary | ICD-10-CM | POA: Diagnosis not present

## 2019-05-05 DIAGNOSIS — G8929 Other chronic pain: Secondary | ICD-10-CM

## 2019-05-05 NOTE — Progress Notes (Signed)
Office Visit Note   Patient: Jason Contreras           Date of Birth: 1940/10/02           MRN: [id number] Visit Date: 05/05/2019              Requested by: [name], MD [address] [place],   [address] PCP: [name], MD   Assessment & Plan: Visit Diagnoses:  1. Chronic pain of left knee   2. Pain in left hip     Plan: Impression is left knee and left hip osteoarthritis with possible referred pain from his lower back to the left lower extremity.  We will inject the left knee with cortisone today.  But as he is diabetic I do not want to also inject the left hip.  He will follow-up with Dr. Junius Roads in 2 weeks for left cortisone injection if still having pain.  Call with concerns or questions in the meantime.  Follow-Up Instructions: Return for with Dr. Junius Roads in 2 weeks for left hip injection if still having pain.   Orders:  Orders Placed This Encounter  Procedures  . XR Knee Complete 4 Views Left  . XR Pelvis 1-2 Views   No orders of the defined types were placed in this encounter.     Procedures: No procedures performed   Clinical Data: No additional findings.   Subjective: Chief Complaint  Patient presents with  . Lower Back - Pain  . Left Knee - Pain  . Left Hip - Pain    HPI patient is a pleasant 78 year old gentleman who I saw back in July for left lower extremity radiculopathy.  He comes in today with left lateral hip and groin pain as well as pain down the anterior thigh to the knee.  He notes that his pain is worse first thing in the morning as well as when he is going from a seated to standing position and when he is straightening his knee.  Throughout the day he does note improvement of pain.  He has been taking Tylenol with minimal relief of symptoms.  No new numbness, tingling or burning.  He does have a history of neuropathy currently on gabapentin and Cymbalta.  Review of Systems as detailed in HPI.  All others reviewed and are  negative.   Objective: Vital Signs: There were no vitals taken for this visit.  Physical Exam well-developed well-nourished gentleman in no acute distress.  Alert and oriented x3.  Ortho Exam examination of his lumbar spine reveals increased pain with flexion.  No paraspinous or spinous tenderness.  He does have moderate tenderness over the trochanteric bursa.  Negative logroll and negative FADIR.  Positive straight leg raise.  Left knee exam shows a trace effusion.  Range of motion 0 to 95 degrees.  Mild patellofemoral crepitus.  Medial joint line tenderness.  He is neurovascular intact distally.  Specialty Comments:  No specialty comments available.  Imaging: XR Knee Complete 4 Views Left  Result Date: 05/05/2019 Moderate joint space narrowing medial compartment  XR Pelvis 1-2 Views  Result Date: 05/05/2019 Moderate joint space narrowing    PMFS History: Patient Active Problem List   Diagnosis Date Noted  . DJD (degenerative joint disease), lumbosacral 11/04/2017  . Renal cell cancer, right ([facility]) 10/17/2017  . Advanced care planning/counseling discussion 01/13/2017  . Arthritis 01/13/2017  . PE (physical exam), annual 01/13/2017  . Senile purpura ([facility]) 09/26/2016  . Fatigue 09/26/2016  .  Adenocarcinoma, renal cell ([facility]) 12/27/2015  . Elevated PSA 01/07/2015  . BPH with obstruction/lower urinary tract symptoms 01/07/2015  . DM due to underlying condition with diabetic nephropathy ([facility])   . Hypertension associated with diabetes ([facility])   . Hyperlipidemia associated with type 2 diabetes mellitus ([facility])    Past Medical History:  Diagnosis Date  . Adenocarcinoma, renal cell ([facility])   . Arthritis   . Diabetes mellitus without complication ([facility])   . Elevated PSA   . Headache   . Hematuria   . Hyperlipidemia   . Hypertension   . Neuropathy   . Obesity   . PONV (postoperative nausea and vomiting)   . Renal insufficiency   . Right renal mass     Family History  Problem  Relation Age of Onset  . Bone cancer Mother   . Cancer Mother   . Cancer Father   . Stroke Brother   . Kidney disease Neg Hx     Past Surgical History:  Procedure Laterality Date  . APPENDECTOMY    . CRYOABLATION  10/17/2017  . IR RADIOLOGIST EVAL & MGMT  07/17/2017  . IR RADIOLOGIST EVAL & MGMT  08/13/2017  . IR RADIOLOGIST EVAL & MGMT  11/12/2017  . IR RADIOLOGIST EVAL & MGMT  03/05/2018  . RADIOLOGY WITH ANESTHESIA Right 10/17/2017   Procedure: CT WITH ANESTHESIA RENAL CRYOABLATION;  Surgeon: [name], MD;  Location: [facility];  Service: Anesthesiology;  Laterality: Right;  . ROBOTIC ASSITED PARTIAL NEPHRECTOMY Right 12/12/2015   Procedure: ROBOTIC ASSITED PARTIAL NEPHRECTOMY;  Surgeon: [name], MD;  Location: [facility] ORS;  Service: Urology;  Laterality: Right;   Social History   Occupational History  . Not on file  Tobacco Use  . Smoking status: Never Smoker  . Smokeless tobacco: Never Used  Substance and Sexual Activity  . Alcohol use: No  . Drug use: No  . Sexual activity: Never

## 2019-05-28 ENCOUNTER — Ambulatory Visit (INDEPENDENT_AMBULATORY_CARE_PROVIDER_SITE_OTHER): Payer: Medicare Other | Admitting: Nurse Practitioner

## 2019-05-28 ENCOUNTER — Encounter: Payer: Self-pay | Admitting: Nurse Practitioner

## 2019-05-28 ENCOUNTER — Other Ambulatory Visit: Payer: Self-pay

## 2019-05-28 VITALS — BP 111/69 | HR 76 | Temp 98.5°F | Ht 68.31 in | Wt 211.8 lb

## 2019-05-28 DIAGNOSIS — D519 Vitamin B12 deficiency anemia, unspecified: Secondary | ICD-10-CM | POA: Diagnosis not present

## 2019-05-28 DIAGNOSIS — N138 Other obstructive and reflux uropathy: Secondary | ICD-10-CM

## 2019-05-28 DIAGNOSIS — E1159 Type 2 diabetes mellitus with other circulatory complications: Secondary | ICD-10-CM | POA: Diagnosis not present

## 2019-05-28 DIAGNOSIS — E0821 Diabetes mellitus due to underlying condition with diabetic nephropathy: Secondary | ICD-10-CM

## 2019-05-28 DIAGNOSIS — E538 Deficiency of other specified B group vitamins: Secondary | ICD-10-CM

## 2019-05-28 DIAGNOSIS — Z85528 Personal history of other malignant neoplasm of kidney: Secondary | ICD-10-CM

## 2019-05-28 DIAGNOSIS — R972 Elevated prostate specific antigen [PSA]: Secondary | ICD-10-CM

## 2019-05-28 DIAGNOSIS — M1990 Unspecified osteoarthritis, unspecified site: Secondary | ICD-10-CM

## 2019-05-28 DIAGNOSIS — E1169 Type 2 diabetes mellitus with other specified complication: Secondary | ICD-10-CM | POA: Diagnosis not present

## 2019-05-28 DIAGNOSIS — N401 Enlarged prostate with lower urinary tract symptoms: Secondary | ICD-10-CM

## 2019-05-28 DIAGNOSIS — Z0000 Encounter for general adult medical examination without abnormal findings: Secondary | ICD-10-CM

## 2019-05-28 DIAGNOSIS — I10 Essential (primary) hypertension: Secondary | ICD-10-CM | POA: Diagnosis not present

## 2019-05-28 DIAGNOSIS — E785 Hyperlipidemia, unspecified: Secondary | ICD-10-CM | POA: Diagnosis not present

## 2019-05-28 DIAGNOSIS — D692 Other nonthrombocytopenic purpura: Secondary | ICD-10-CM

## 2019-05-28 LAB — MICROALBUMIN, URINE WAIVED
Creatinine, Urine Waived: 100 mg/dL (ref 10–300)
Microalb, Ur Waived: 10 mg/L (ref 0–19)
Microalb/Creat Ratio: <30 mg/g (ref <30)

## 2019-05-28 LAB — BAYER DCA HB A1C WAIVED: HB A1C (BAYER DCA - WAIVED): 5.8 % (ref <7.0)

## 2019-05-28 MED ORDER — METFORMIN HCL 500 MG PO TABS: ORAL_TABLET | ORAL | 3 refills | Status: DC

## 2019-05-28 MED ORDER — GABAPENTIN 600 MG PO TABS: 600.0000 mg | ORAL_TABLET | Freq: Three times a day (TID) | ORAL | 3 refills | Status: DC

## 2019-05-28 MED ORDER — DULOXETINE HCL 60 MG PO CPEP: 60.0000 mg | ORAL_CAPSULE | Freq: Every day | ORAL | 3 refills | Status: DC

## 2019-05-28 NOTE — Assessment & Plan Note (Signed)
Chronic, ongoing in patient with history of renal cell CA.  A1C today tightly controlled for age at 5.8%.  Patient with hypertension and underlying cardiac risk, discussed at length with him and his wife.  Will discontinue Actos and have patient monitor BS daily in morning at home, glucometer order for his wife and him.  Continue Metformin and Glimepiride, goal in future is to discontinue Glimepiride if A1C stable and consider use of alternate options such as GLP due his age and risk for hypoglycemia. Will place CCM referral, discussed with patient.  Return to office in 4 weeks to assess medication changes.

## 2019-05-28 NOTE — Assessment & Plan Note (Signed)
Chronic, stable.  Recommend continue to perform gentle skin care and monitor for skin breakdown, if present notify provider.

## 2019-05-28 NOTE — Assessment & Plan Note (Addendum)
Followed by urology with upcoming appointment, will obtain PSA today for them.

## 2019-05-28 NOTE — Assessment & Plan Note (Addendum)
Chronic, ongoing pain.  Plan today with CrCl 57.64: - Increase Gabapentin to 600 MG TID (max dose for current kidney function) - Increase Duloxetine to 60 MG - May continue Tylenol 1000 MG TID Continue collaboration with orthopedics and if ongoing pain consider referral to pain clinic for possible back injections or alternate options.  Return in 4 weeks for follow-up.

## 2019-05-28 NOTE — Patient Instructions (Addendum)
Duloxetine start taking 2 of your 30 MG tablets daily until all out and then pick up 60 MG tablets from pharmacy.  Gabapentin start taking 600 MG three times a day ---- 2 tablets three times a day  Stop taking Actos  Osteoarthritis  Osteoarthritis is a type of arthritis that affects tissue that covers the ends of bones in joints (cartilage). Cartilage acts as a cushion between the bones and helps them move smoothly. Osteoarthritis results when cartilage in the joints gets worn down. Osteoarthritis is sometimes called "wear and tear" arthritis. Osteoarthritis is the most common form of arthritis. It often occurs in older people. It is a condition that gets worse over time (a progressive condition). Joints that are most often affected by this condition are in:  Fingers.  Toes.  Hips.  Knees.  Spine, including neck and lower back. What are the causes? This condition is caused by age-related wearing down of cartilage that covers the ends of bones. What increases the risk? The following factors may make you more likely to develop this condition:  Older age.  Being overweight or obese.  Overuse of joints, such as in athletes.  Past injury of a joint.  Past surgery on a joint.  Family history of osteoarthritis. What are the signs or symptoms? The main symptoms of this condition are pain, swelling, and stiffness in the joint. The joint may lose its shape over time. Small pieces of bone or cartilage may break off and float inside of the joint, which may cause more pain and damage to the joint. Small deposits of bone (osteophytes) may grow on the edges of the joint. Other symptoms may include:  A grating or scraping feeling inside the joint when you move it.  Popping or creaking sounds when you move. Symptoms may affect one or more joints. Osteoarthritis in a major joint, such as your knee or hip, can make it painful to walk or exercise. If you have osteoarthritis in your hands, you  might not be able to grip items, twist your hand, or control small movements of your hands and fingers (fine motor skills). How is this diagnosed? This condition may be diagnosed based on:  Your medical history.  A physical exam.  Your symptoms.  X-rays of the affected joint(s).  Blood tests to rule out other types of arthritis. How is this treated? There is no cure for this condition, but treatment can help to control pain and improve joint function. Treatment plans may include:  A prescribed exercise program that allows for rest and joint relief. You may work with a physical therapist.  A weight control plan.  Pain relief techniques, such as: ? Applying heat and cold to the joint. ? Electric pulses delivered to nerve endings under the skin (transcutaneous electrical nerve stimulation, or TENS). ? Massage. ? Certain nutritional supplements.  NSAIDs or prescription medicines to help relieve pain.  Medicine to help relieve pain and inflammation (corticosteroids). This can be given by mouth (orally) or as an injection.  Assistive devices, such as a brace, wrap, splint, specialized glove, or cane.  Surgery, such as: ? An osteotomy. This is done to reposition the bones and relieve pain or to remove loose pieces of bone and cartilage. ? Joint replacement surgery. You may need this surgery if you have very bad (advanced) osteoarthritis. Follow these instructions at home: Activity  Rest your affected joints as directed by your health care provider.  Do not drive or use heavy machinery while taking  prescription pain medicine.  Exercise as directed. Your health care provider or physical therapist may recommend specific types of exercise, such as: ? Strengthening exercises. These are done to strengthen the muscles that support joints that are affected by arthritis. They can be performed with weights or with exercise bands to add resistance. ? Aerobic activities. These are exercises,  such as brisk walking or water aerobics, that get your heart pumping. ? Range-of-motion activities. These keep your joints easy to move. ? Balance and agility exercises. Managing pain, stiffness, and swelling      If directed, apply heat to the affected area as often as told by your health care provider. Use the heat source that your health care provider recommends, such as a moist heat pack or a heating pad. ? If you have a removable assistive device, remove it as told by your health care provider. ? Place a towel between your skin and the heat source. If your health care provider tells you to keep the assistive device on while you apply heat, place a towel between the assistive device and the heat source. ? Leave the heat on for 20-30 minutes. ? Remove the heat if your skin turns bright red. This is especially important if you are unable to feel pain, heat, or cold. You may have a greater risk of getting burned.  If directed, put ice on the affected joint: ? If you have a removable assistive device, remove it as told by your health care provider. ? Put ice in a plastic bag. ? Place a towel between your skin and the bag. If your health care provider tells you to keep the assistive device on during icing, place a towel between the assistive device and the bag. ? Leave the ice on for 20 minutes, 2-3 times a day. General instructions  Take over-the-counter and prescription medicines only as told by your health care provider.  Maintain a healthy weight. Follow instructions from your health care provider for weight control. These may include dietary restrictions.  Do not use any products that contain nicotine or tobacco, such as cigarettes and e-cigarettes. These can delay bone healing. If you need help quitting, ask your health care provider.  Use assistive devices as directed by your health care provider.  Keep all follow-up visits as told by your health care provider. This is  important. Where to find more information  [employer] of Arthritis and Musculoskeletal and Skin Diseases: www.niams.[employer].es  [employer] on Aging: http://kim-miller.com/  American College of Rheumatology: www.rheumatology.org Contact a health care provider if:  Your skin turns red.  You develop a rash.  You have pain that gets worse.  You have a fever along with joint or muscle aches. Get help right away if:  You lose a lot of weight.  You suddenly lose your appetite.  You have night sweats. Summary  Osteoarthritis is a type of arthritis that affects tissue covering the ends of bones in joints (cartilage).  This condition is caused by age-related wearing down of cartilage that covers the ends of bones.  The main symptom of this condition is pain, swelling, and stiffness in the joint.  There is no cure for this condition, but treatment can help to control pain and improve joint function. This information is not intended to replace advice given to you by your health care provider. Make sure you discuss any questions you have with your health care provider. Document Revised: 04/04/2017 Document Reviewed: 12/25/2015 Elsevier Patient Education  [phone]  [employer].

## 2019-05-28 NOTE — Assessment & Plan Note (Signed)
Chronic, stable with BP below goal today.  Recommend he monitor BP at home at least three mornings a week.  Continue current medication regimen and adjust as needed.  CMP today.  Return in 3 months.

## 2019-05-28 NOTE — Assessment & Plan Note (Signed)
Chronic, ongoing.  Continue current medication regimen and collaboration with urology.

## 2019-05-28 NOTE — Assessment & Plan Note (Addendum)
Followed by urology, continue this collaboration and review notes when present. ? ?

## 2019-05-28 NOTE — Progress Notes (Signed)
BP 111/69 (BP Location: Left Arm, Patient Position: Sitting, Cuff Size: Normal)   Pulse 76   Temp 98.5 F (36.9 C) (Oral)   Ht 5' 8.31" (1.735 m)   Wt 211 lb 12.8 oz (96.1 kg)   SpO2 97%   BMI 31.92 kg/m    Subjective:    Patient ID: Jason Contreras, male    DOB: 1941/02/08, 79 y.o.   MRN: [id number]  HPI: SAID SCHNACK is a 79 y.o. male presenting on 05/28/2019 for comprehensive medical examination. Current medical complaints include:none  He currently lives with: wife Interim Problems from his last visit: no   DIABETES Continues on Actos, Metformin, and Glimepiride (taking 2 MG).  Last A1C was 5.6% in June 2020. Hypoglycemic episodes:no Polydipsia/polyuria: no Visual disturbance: no Chest pain: no Paresthesias: no Glucose Monitoring: no  Accucheck frequency: Not Checking  Fasting glucose:  Post prandial:  Evening:  Before meals: Taking Insulin?: no  Long acting insulin:  Short acting insulin: Blood Pressure Monitoring: a few times a week Retinal Examination: Not up to Date Foot Exam: Up to Date Pneumovax: Not up to Date Influenza: Up to Date Aspirin: no   HYPERTENSION / HYPERLIPIDEMIA Continues on Atorvastatin 20 MG, Losartan 100 MG, and Coreg 6.25 MG BID. Satisfied with current treatment? yes Duration of hypertension: chronic BP monitoring frequency: a few times a week BP range:  BP medication side effects: no Duration of hyperlipidemia: chronic Cholesterol medication side effects: no Cholesterol supplements: none Medication compliance: good compliance Aspirin: no Recent stressors: no Recurrent headaches: no Visual changes: no Palpitations: no Dyspnea: no Chest pain: no Lower extremity edema: no Dizzy/lightheaded: no  Current CrCl --- 57.64   CHRONIC PAIN (ARTHRITIS) Sees orthopedics, last saw 05/05/2019.  Has had knee injections, recent knee injection which worked for 2 1/2 weeks.  Has chronic pain in knees, fingers, shoulders, and lower back +  hips.  Pain originally started in lower back.  For his hip he reports that orthopedics wants to perform u/s guided injection.  He reports they have run a lot of blood work on him and state he has OA. Currently taking Gabapentin 300 MG -- takes 600 MG in morning and 600 MG at supper time + takes Duloxetine 30 MG daily.  Does take Tylenol at home, takes 1000 MG TID.  Lost about 14 pounds in the past 6 months due to chronic pain and not eating as much due to pain. Pain control status: uncontrolled Duration: chronic Location: multiple locations noted above Quality: dull and aching Current Pain Level: 8/10 at worst, noted more if walking Previous Pain Level: 8/10 Breakthrough pain: no Benefit from narcotic medications: none taken What Activities task can be accomplished with current medication? Has difficulty with all ADLs due to pain Previous pain specialty evaluation: no Non-narcotic analgesic meds: yes Narcotic contract: not needed at this time  Current CrCl --- 57.64 ---- max Gabapentin 1800 MG divided in three doses  BPH Continues on Tamsulosin 0.4 MG daily.  He is followed by urology due to history of renal cancer, is scheduled to follow up with them next week, agrees to PSA in office today. BPH status: stable Satisfied with current treatment?: yes Medication side effects: no Medication compliance: good compliance Duration: chronic Nocturia: no Urinary frequency:no Incomplete voiding: no Urgency: no Weak urinary stream: no Straining to start stream: no Dysuria: no  Functional Status Survey: Is the patient deaf or have difficulty hearing?: No Does the patient have difficulty seeing, even when wearing  glasses/contacts?: Yes Does the patient have difficulty concentrating, remembering, or making decisions?: No Does the patient have difficulty walking or climbing stairs?: Yes Does the patient have difficulty dressing or bathing?: No Does the patient have difficulty doing errands  alone such as visiting a doctor's office or shopping?: No  FALL RISK: Fall Risk  01/21/2019 01/08/2018 11/04/2017 01/01/2017 09/26/2016  Falls in the past year? 0 No No No No  Number falls in past yr: - - - - -  Injury with Fall? - - - - -    Depression Screen Depression screen [facility] 2/9 05/28/2019 01/21/2019 01/08/2018 11/04/2017 01/01/2017  Decreased Interest 0 0 0 0 0  Down, Depressed, Hopeless 0 0 0 0 0  PHQ - 2 Score 0 0 0 0 0  Altered sleeping 2 - - - -  Tired, decreased energy 3 - - - -  Change in appetite 1 - - - -  Feeling bad or failure about yourself  0 - - - -  Trouble concentrating 0 - - - -  Moving slowly or fidgety/restless 0 - - - -  Suicidal thoughts 0 - - - -  PHQ-9 Score 6 - - - -  Difficult doing work/chores Somewhat difficult - - - -    Advanced Directives <no information>  Past Medical History:  Past Medical History:  Diagnosis Date  . Adenocarcinoma, renal cell ([place])   . Arthritis   . Diabetes mellitus without complication ([place])   . Elevated PSA   . Headache   . Hematuria   . Hyperlipidemia   . Hypertension   . Neuropathy   . Obesity   . PONV (postoperative nausea and vomiting)   . Renal insufficiency   . Right renal mass     Surgical History:  Past Surgical History:  Procedure Laterality Date  . APPENDECTOMY    . CRYOABLATION  10/17/2017  . IR RADIOLOGIST EVAL & MGMT  07/17/2017  . IR RADIOLOGIST EVAL & MGMT  08/13/2017  . IR RADIOLOGIST EVAL & MGMT  11/12/2017  . IR RADIOLOGIST EVAL & MGMT  03/05/2018  . RADIOLOGY WITH ANESTHESIA Right 10/17/2017   Procedure: CT WITH ANESTHESIA RENAL CRYOABLATION;  Surgeon: [name], MD;  Location: [facility] OR[facility];  Service: Anesthesiology;  Laterality: Right;  . ROBOTIC ASSITED PARTIAL NEPHRECTOMY Right 12/12/2015   Procedure: ROBOTIC ASSITED PARTIAL NEPHRECTOMY;  Surgeon: [name], MD;  Location: [facility] ORS;  Service: Urology;  Laterality: Right;    Medications:  Current Outpatient Medications on File Prior to Visit   Medication Sig  . acetaminophen (TYLENOL) 325 MG tablet Take 650 mg by mouth daily as needed for headache.  [name] atorvastatin (LIPITOR) 20 MG tablet TAKE 1 TABLET BY MOUTH  DAILY AT 6 PM.  . carvedilol (COREG) 6.25 MG tablet TAKE 1 TABLET BY MOUTH TWO  TIMES DAILY  . glimepiride (AMARYL) 4 MG tablet TAKE ONE-HALF TABLET BY  MOUTH DAILY  . losartan (COZAAR) 100 MG tablet TAKE 1 TABLET BY MOUTH  DAILY  . mometasone (ELOCON) 0.1 % cream APPLY  CREAM TOPICALLY ONCE DAILY  . tamsulosin (FLOMAX) 0.4 MG CAPS capsule TAKE 1 CAPSULE BY MOUTH  DAILY  . lidocaine (XYLOCAINE) 5 % ointment Apply 1 application topically 3 (three) times daily as needed. (Patient not taking: Reported on 05/28/2019)   No current facility-administered medications on file prior to visit.    Allergies:  Allergies  Allergen Reactions  . Morphine And Related Nausea And Vomiting    Social History:  Social History   Socioeconomic History  . Marital status: Married    Spouse name: Not on file  . Number of children: Not on file  . Years of education: 3 years college   . Highest education level: Some college, no degree  Occupational History  . Not on file  Tobacco Use  . Smoking status: Never Smoker  . Smokeless tobacco: Never Used  Substance and Sexual Activity  . Alcohol use: No  . Drug use: No  . Sexual activity: Never  Other Topics Concern  . Not on file  Social History Narrative  . Not on file   Social Determinants of Health   Financial Resource Strain:   . Difficulty of Paying Living Expenses: Not on file  Food Insecurity:   . Worried About Charity fundraiser in the Last Year: Not on file  . Ran Out of Food in the Last Year: Not on file  Transportation Needs:   . Lack of Transportation (Medical): Not on file  . Lack of Transportation (Non-Medical): Not on file  Physical Activity:   . Days of Exercise per Week: Not on file  . Minutes of Exercise per Session: Not on file  Stress:   . Feeling of Stress  : Not on file  Social Connections:   . Frequency of Communication with Friends and Family: Not on file  . Frequency of Social Gatherings with Friends and Family: Not on file  . Attends Religious Services: Not on file  . Active Member of Clubs or Organizations: Not on file  . Attends Archivist Meetings: Not on file  . Marital Status: Not on file  Intimate Partner Violence:   . Fear of Current or Ex-Partner: Not on file  . Emotionally Abused: Not on file  . Physically Abused: Not on file  . Sexually Abused: Not on file   Social History   Tobacco Use  Smoking Status Never Smoker  Smokeless Tobacco Never Used   Social History   Substance and Sexual Activity  Alcohol Use No    Family History:  Family History  Problem Relation Age of Onset  . Bone cancer Mother   . Cancer Mother   . Cancer Father   . Stroke Brother   . Kidney disease Neg Hx     Past medical history, surgical history, medications, allergies, family history and social history reviewed with patient today and changes made to appropriate areas of the chart.   Review of Systems - negative All other ROS negative except what is listed above and in the HPI.      Objective:    BP 111/69 (BP Location: Left Arm, Patient Position: Sitting, Cuff Size: Normal)   Pulse 76   Temp 98.5 F (36.9 C) (Oral)   Ht 5' 8.31" (1.735 m)   Wt 211 lb 12.8 oz (96.1 kg)   SpO2 97%   BMI 31.92 kg/m   Wt Readings from Last 3 Encounters:  05/28/19 211 lb 12.8 oz (96.1 kg)  10/16/18 224 lb (101.6 kg)  06/16/18 227 lb 6.4 oz (103.1 kg)    Physical Exam Vitals and nursing note reviewed.  Constitutional:      General: He is awake. He is not in acute distress.    Appearance: He is well-developed and overweight. He is not ill-appearing.  HENT:     Head: Normocephalic and atraumatic.     Right Ear: Hearing, tympanic membrane, ear canal and external ear normal. No drainage.  Left Ear: Hearing, tympanic membrane, ear  canal and external ear normal. No drainage.     Nose: Nose normal.     Mouth/Throat:     Pharynx: Uvula midline.  Eyes:     General: Lids are normal.        Right eye: No discharge.        Left eye: No discharge.     Extraocular Movements: Extraocular movements intact.     Conjunctiva/sclera: Conjunctivae normal.     Pupils: Pupils are equal, round, and reactive to light.     Visual Fields: Right eye visual fields normal and left eye visual fields normal.  Neck:     Thyroid: No thyromegaly.     Vascular: No carotid bruit or JVD.     Trachea: Trachea normal.  Cardiovascular:     Rate and Rhythm: Normal rate and regular rhythm.     Heart sounds: Normal heart sounds, S1 normal and S2 normal. No murmur. No gallop.   Pulmonary:     Effort: Pulmonary effort is normal. No accessory muscle usage or respiratory distress.     Breath sounds: Normal breath sounds.  Abdominal:     General: Bowel sounds are normal.     Palpations: Abdomen is soft. There is no hepatomegaly or splenomegaly.     Tenderness: There is no abdominal tenderness.  Genitourinary:    Comments: Deferred per patient request. Musculoskeletal:        General: Normal range of motion.     Cervical back: Normal range of motion and neck supple.     Right lower leg: No edema.     Left lower leg: No edema.  Lymphadenopathy:     Head:     Right side of head: No submental, submandibular, tonsillar, preauricular or posterior auricular adenopathy.     Left side of head: No submental, submandibular, tonsillar, preauricular or posterior auricular adenopathy.     Cervical: No cervical adenopathy.  Skin:    General: Skin is warm and dry.     Capillary Refill: Capillary refill takes less than 2 seconds.     Findings: No rash.     Comments: A few scattered pale purple bruises to bilateral upper extremity.  Neurological:     Mental Status: He is alert and oriented to person, place, and time.     Cranial Nerves: Cranial nerves are  intact.     Gait: Gait is intact.     Deep Tendon Reflexes: Reflexes are normal and symmetric.     Reflex Scores:      Brachioradialis reflexes are 2+ on the right side and 2+ on the left side.      Patellar reflexes are 2+ on the right side and 2+ on the left side. Psychiatric:        Attention and Perception: Attention normal.        Mood and Affect: Mood normal.        Speech: Speech normal.        Behavior: Behavior normal. Behavior is cooperative.        Thought Content: Thought content normal.        Cognition and Memory: Cognition normal.        Judgment: Judgment normal.       Results for orders placed or performed in visit on 05/28/19  Bayer DCA Hb A1c Waived  Result Value Ref Range   HB A1C (BAYER DCA - WAIVED) 5.8 <7.0 %  Microalbumin, Urine Waived  Result  Value Ref Range   Microalb, Ur Waived 10 0 - 19 mg/L   Creatinine, Urine Waived 100 10 - 300 mg/dL   Microalb/Creat Ratio <30 <30 mg/g      Assessment & Plan:   Problem List Items Addressed This Visit      Cardiovascular and Mediastinum   Hypertension associated with diabetes ([place])    Chronic, stable with BP below goal today.  Recommend he monitor BP at home at least three mornings a week.  Continue current medication regimen and adjust as needed.  CMP today.  Return in 3 months.      Relevant Medications   metFORMIN (GLUCOPHAGE) 500 MG tablet   Other Relevant Orders   Bayer DCA Hb A1c Waived (Completed)   Microalbumin, Urine Waived (Completed)   Comprehensive metabolic panel   CBC with Differential/Platelet   TSH   Senile purpura (HCC)    Chronic, stable.  Recommend continue to perform gentle skin care and monitor for skin breakdown, if present notify provider.        Endocrine   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  A1C today tightly controlled for age at 5.8%.  Patient with hypertension and underlying cardiac  risk, discussed at length with him and his wife.  Will discontinue Actos and have patient monitor BS daily in morning at home, glucometer order for his wife and him.  Continue Metformin and Glimepiride, goal in future is to discontinue Glimepiride if A1C stable and consider use of alternate options such as GLP due his age and risk for hypoglycemia. Will place CCM referral, discussed with patient.  Return to office in 4 weeks to assess medication changes.      Relevant Medications   metFORMIN (GLUCOPHAGE) 500 MG tablet   Other Relevant Orders   Bayer DCA Hb A1c Waived (Completed)   Microalbumin, Urine Waived (Completed)   Comprehensive metabolic panel   Hyperlipidemia associated with type 2 diabetes mellitus (HCC)    Chronic, ongoing.  Continue current medication regimen and adjust as needed.  Lipid panel today.      Relevant Medications   metFORMIN (GLUCOPHAGE) 500 MG tablet   Other Relevant Orders   Bayer DCA Hb A1c Waived (Completed)   Lipid Panel w/o Chol/HDL Ratio     Musculoskeletal and Integument   Arthritis    Chronic, ongoing pain.  Plan today with CrCl 57.64: - Increase Gabapentin to 600 MG TID (max dose for current kidney function) - Increase Duloxetine to 60 MG - May continue Tylenol 1000 MG TID Continue collaboration with orthopedics and if ongoing pain consider referral to pain clinic for possible back injections or alternate options.  Return in 4 weeks for follow-up.        Genitourinary   BPH with obstruction/lower urinary tract symptoms    Chronic, ongoing.  Continue current medication regimen and collaboration with urology.        Relevant Orders   PSA     Other   Elevated PSA    Followed by urology with upcoming appointment, will obtain PSA today for them.      Personal history of renal cancer    Followed by urology, continue this collaboration and review notes when present.        Other Visit Diagnoses    Annual physical exam    -  Primary   Annual  labs to include TSH, lipid, CBC, CMP,  PSA   B12 deficiency       Reports history of low level, with chronic Metformin use and pain will check level today.   Relevant Orders   Vitamin B12       Discussed aspirin prophylaxis for myocardial infarction prevention and decision was refuses  LABORATORY TESTING:  Health maintenance labs ordered today as discussed above.   The natural history of prostate cancer and ongoing controversy regarding screening and potential treatment outcomes of prostate cancer has been discussed with the patient. The meaning of a false positive PSA and a false negative PSA has been discussed. He indicates understanding of the limitations of this screening test and wishes  to proceed with screening PSA testing.   IMMUNIZATIONS:   - Tdap: Tetanus vaccination status reviewed: last tetanus booster within 10 years. - Influenza: Up to date - Pneumovax: Up to date - Prevnar: Refused - Zostavax vaccine: Refused  SCREENING: - Colonoscopy: Not applicable  Discussed with patient purpose of the colonoscopy is to detect colon cancer at curable precancerous or early stages   - AAA Screening: Not applicable  -Hearing Test: Not applicable  -Spirometry: Not applicable   PATIENT COUNSELING:    Sexuality: Discussed sexually transmitted diseases, partner selection, use of condoms, avoidance of unintended pregnancy  and contraceptive alternatives.   Advised to avoid cigarette smoking.  I discussed with the patient that most people either abstain from alcohol or drink within safe limits (<=14/week and <=4 drinks/occasion for males, <=7/weeks and <= 3 drinks/occasion for females) and that the risk for alcohol disorders and other health effects rises proportionally with the number of drinks per week and how often a drinker exceeds daily limits.  Discussed cessation/primary prevention of drug use and availability of treatment for abuse.   Diet: Encouraged to adjust caloric intake  to maintain  or achieve ideal body weight, to reduce intake of dietary saturated fat and total fat, to limit sodium intake by avoiding high sodium foods and not adding table salt, and to maintain adequate dietary potassium and calcium preferably from fresh fruits, vegetables, and low-fat dairy products.    stressed the importance of regular exercise  Injury prevention: Discussed safety belts, safety helmets, smoke detector, smoking near bedding or upholstery.   Dental health: Discussed importance of regular tooth brushing, flossing, and dental visits.   Follow up plan: NEXT PREVENTATIVE PHYSICAL DUE IN 1 YEAR. Return in about 4 weeks (around 06/25/2019) for T2DM + 3 months follow-up for T2DM, HTN/HLD.

## 2019-05-28 NOTE — Assessment & Plan Note (Signed)
Chronic, ongoing.  Continue current medication regimen and adjust as needed. Lipid panel today. 

## 2019-05-29 LAB — LIPID PANEL W/O CHOL/HDL RATIO
Cholesterol, Total: 97 mg/dL — ABNORMAL LOW (ref 100–199)
HDL: 41 mg/dL (ref >39)
LDL Chol Calc (NIH): 38 mg/dL (ref 0–99)
Triglycerides: 89 mg/dL (ref 0–149)
VLDL Cholesterol Cal: 18 mg/dL (ref 5–40)

## 2019-05-29 LAB — COMPREHENSIVE METABOLIC PANEL
ALT: 8 IU/L (ref 0–44)
AST: 13 IU/L (ref 0–40)
Albumin/Globulin Ratio: 1.6 (ref 1.2–2.2)
Albumin: 4 g/dL (ref 3.7–4.7)
Alkaline Phosphatase: 99 IU/L (ref 39–117)
BUN/Creatinine Ratio: 19 (ref 10–24)
BUN: 13 mg/dL (ref 8–27)
Bilirubin Total: 0.5 mg/dL (ref 0.0–1.2)
CO2: 28 mmol/L (ref 20–29)
Calcium: 9.3 mg/dL (ref 8.6–10.2)
Chloride: 100 mmol/L (ref 96–106)
Creatinine, Ser: 0.7 mg/dL — ABNORMAL LOW (ref 0.76–1.27)
GFR calc Af Amer: 106 mL/min/{1.73_m2} (ref >59)
GFR calc non Af Amer: 92 mL/min/{1.73_m2} (ref >59)
Globulin, Total: 2.5 g/dL (ref 1.5–4.5)
Glucose: 141 mg/dL — ABNORMAL HIGH (ref 65–99)
Potassium: 5.2 mmol/L (ref 3.5–5.2)
Sodium: 141 mmol/L (ref 134–144)
Total Protein: 6.5 g/dL (ref 6.0–8.5)

## 2019-05-29 LAB — CBC WITH DIFFERENTIAL/PLATELET
Basophils Absolute: 0 10*3/uL (ref 0.0–0.2)
Basos: 0 %
EOS (ABSOLUTE): 0 10*3/uL (ref 0.0–0.4)
Eos: 1 %
Hematocrit: 38.7 % (ref 37.5–51.0)
Hemoglobin: 12.8 g/dL — ABNORMAL LOW (ref 13.0–17.7)
Immature Grans (Abs): 0 10*3/uL (ref 0.0–0.1)
Immature Granulocytes: 0 %
Lymphocytes Absolute: 1.1 10*3/uL (ref 0.7–3.1)
Lymphs: 22 %
MCH: 32 pg (ref 26.6–33.0)
MCHC: 33.1 g/dL (ref 31.5–35.7)
MCV: 97 fL (ref 79–97)
Monocytes Absolute: 0.4 10*3/uL (ref 0.1–0.9)
Monocytes: 7 %
Neutrophils Absolute: 3.5 10*3/uL (ref 1.4–7.0)
Neutrophils: 70 %
Platelets: 308 10*3/uL (ref 150–450)
RBC: 4 x10E6/uL — ABNORMAL LOW (ref 4.14–5.80)
RDW: 12.4 % (ref 11.6–15.4)
WBC: 5.1 10*3/uL (ref 3.4–10.8)

## 2019-05-29 LAB — VITAMIN B12: Vitamin B-12: 106 pg/mL — ABNORMAL LOW (ref 232–1245)

## 2019-05-29 LAB — TSH: TSH: 3.41 u[IU]/mL (ref 0.450–4.500)

## 2019-05-29 LAB — PSA: Prostate Specific Ag, Serum: 10.3 ng/mL — ABNORMAL HIGH (ref 0.0–4.0)

## 2019-05-30 NOTE — Progress Notes (Signed)
Contacted via MyChart

## 2019-06-07 ENCOUNTER — Encounter: Payer: Self-pay | Admitting: Nurse Practitioner

## 2019-06-08 ENCOUNTER — Telehealth: Payer: Medicare Other

## 2019-06-11 ENCOUNTER — Telehealth: Payer: Medicare Other | Admitting: General Practice

## 2019-06-11 ENCOUNTER — Ambulatory Visit (INDEPENDENT_AMBULATORY_CARE_PROVIDER_SITE_OTHER): Payer: Medicare Other | Admitting: General Practice

## 2019-06-11 ENCOUNTER — Other Ambulatory Visit: Payer: Self-pay | Admitting: Nurse Practitioner

## 2019-06-11 DIAGNOSIS — E1169 Type 2 diabetes mellitus with other specified complication: Secondary | ICD-10-CM

## 2019-06-11 DIAGNOSIS — E0821 Diabetes mellitus due to underlying condition with diabetic nephropathy: Secondary | ICD-10-CM

## 2019-06-11 DIAGNOSIS — M1990 Unspecified osteoarthritis, unspecified site: Secondary | ICD-10-CM

## 2019-06-11 DIAGNOSIS — G8929 Other chronic pain: Secondary | ICD-10-CM

## 2019-06-11 DIAGNOSIS — I10 Essential (primary) hypertension: Secondary | ICD-10-CM

## 2019-06-11 DIAGNOSIS — M47817 Spondylosis without myelopathy or radiculopathy, lumbosacral region: Secondary | ICD-10-CM

## 2019-06-11 DIAGNOSIS — E785 Hyperlipidemia, unspecified: Secondary | ICD-10-CM

## 2019-06-11 NOTE — Chronic Care Management (AMB) (Signed)
°Chronic Care Management  ° °Initial Visit Note ° °06/11/2019 °Name: Jason Contreras MRN: [id number] DOB: 05/24/1940 ° °Referred by: [name], Jolene T, NP °Reason for referral : Chronic Care Management (Initial: Chronic Disease Management and support needs) ° ° °Raysean E Cheslock is a 79 y.o. year old male who is a primary care patient of [name], Jolene T, NP. The CCM team was consulted for assistance with chronic disease management and care coordination needs related to HTN, HLD, DMII and Chronic pain ° °Review of patient status, including review of consultants reports, relevant laboratory and other test results, and collaboration with appropriate care team members and the patient's provider was performed as part of comprehensive patient evaluation and provision of chronic care management services.   ° °SDOH (Social Determinants of Health) screening performed today: Housing  Transportation Financial Strain  Food Insecurity  Depression   Alcohol/Substance Use Tobacco Use Stress Physical Activity. See Care Plan for related entries.  ° °Medications: °Outpatient Encounter Medications as of 06/11/2019  °Medication Sig Note  °• acetaminophen (TYLENOL) 500 MG tablet Take 1,000 mg by mouth every 6 (six) hours as needed.   °• atorvastatin (LIPITOR) 20 MG tablet TAKE 1 TABLET BY MOUTH  DAILY AT 6 PM.   °• carvedilol (COREG) 6.25 MG tablet TAKE 1 TABLET BY MOUTH TWO  TIMES DAILY   °• DULoxetine (CYMBALTA) 60 MG capsule Take 1 capsule (60 mg total) by mouth daily.   °• gabapentin (NEURONTIN) 600 MG tablet Take 1 tablet (600 mg total) by mouth 3 (three) times daily.   °• glimepiride (AMARYL) 4 MG tablet TAKE ONE-HALF TABLET BY  MOUTH DAILY   °• lidocaine (XYLOCAINE) 5 % ointment Apply 1 application topically 3 (three) times daily as needed. 06/11/2019: Uses on his back but does not seem to help  °• losartan (COZAAR) 100 MG tablet TAKE 1 TABLET BY MOUTH  DAILY (Patient taking differently: 50 mg. )   °• metFORMIN (GLUCOPHAGE) 500 MG tablet  TAKE 2 TABLETS BY MOUTH TWO TIMES DAILY   °• mometasone (ELOCON) 0.1 % cream APPLY  CREAM TOPICALLY ONCE DAILY   °• tamsulosin (FLOMAX) 0.4 MG CAPS capsule TAKE 1 CAPSULE BY MOUTH  DAILY   °• vitamin B-12 (CYANOCOBALAMIN) 1000 MCG tablet Take 1,000 mcg by mouth daily.   °• acetaminophen (TYLENOL) 325 MG tablet Take 650 mg by mouth daily as needed for headache.   ° °No facility-administered encounter medications on file as of 06/11/2019.  °  ° °Objective:  ° °BP Readings from Last 3 Encounters:  °06/11/19 138/80  °05/28/19 111/69  °01/21/19 140/74  ° °Lab Results  °Component Value Date  ° HGBA1C 5.8 05/28/2019  ° °Lab Results  °Component Value Date  ° CHOL 97 (L) 05/28/2019  ° HDL 41 05/28/2019  ° LDLCALC 38 05/28/2019  ° TRIG 89 05/28/2019  ° CHOLHDL 2.5 03/12/2018  ° ° °Goals Addressed   °  °  °  °  ° This Visit's Progress  ° • RNCM: I am having a lot of pain especially in my left knee     °  Current Barriers:  °• Knowledge Deficits related to pain control in left knee and other pain due to chronic pain/osteoarthritis °• Impaired mobility due to pain in left knee ° °Nurse Case Manager Clinical Goal(s):  °• Over the next 90 days, patient will verbalize understanding of plan for pain control for chronic pain  °• Over the next 90 days, patient will work with pcp and CCM   CCM team to address needs related to best options for pain relief in patient with chronic pain issues  Over the next 90 days, patient will demonstrate a decrease in pain  exacerbations as evidenced by pain level being less than 6 on a consistent daily basis  Over the next 90 days, patient will attend all scheduled medical appointments: with orthopedic provider and pcp  Over the next 90 days, patient will demonstrate understanding of rationale for each prescribed medication as evidenced by compliance and taking recommended dosages  Over the next 90 days, patient will work with CM team pharmacist to aide in answering questions and concerns about pain  medications and safe administration  Over the next 90 days, the patient will demonstrate ongoing self health care management ability as evidenced by decreased pain level, no falls, and increased mobility  Interventions:   Evaluation of current treatment plan related to unresolved pain due to arthritis worse in the left knee and patient's adherence to plan as established by provider.  Advised patient to call the orthopedic provider for additional recommendations. Patient states the orthopedic doctor can not do anything else for his knee for 3 months  Reviewed medications with patient and discussed compliance, the patient wants to know if he can take something different or more that prescribed (Referred to pcp and pharmacist)  Collaborated with pcp and CCM pharmacist  regarding pain control issues  Discussed plans with patient for ongoing care management follow up and provided patient with direct contact information for care management team  Reviewed scheduled/upcoming provider appointments including: 06-30-2019 at 245 pm with pcp  Pharmacy referral for questions about pain medications and polypharmacy   Patient Self Care Activities:   Patient verbalizes understanding of plan to contact pcp and pharmacist to assist with unresolved pain   Self administers medications as prescribed  Attends all scheduled provider appointments  Calls provider office for new concerns or questions  Unable to independently control pain due to arthritis, especially in left knee  Unable to perform IADLs independently (currently having to use a cane when ambulating)  Initial goal documentation      RNCM: I try to take my bp and blood sugar readings daily       Current Barriers:   Chronic Disease Management support, education, and care coordination needs related to HTN, HLD, and DMII  Clinical Goal(s) related to HTN, HLD, and DMII:  Over the next 90 days, patient will:   Work with the care management  team to address educational, disease management, and care coordination needs   Begin or continue self health monitoring activities as directed today Measure and record cbg (blood glucose) BID times daily, Measure and record blood pressure 7 times per week, and institue a hearth healthy/ADA diet  Call provider office for new or worsened signs and symptoms Blood glucose findings outside established parameters, Blood pressure findings outside established parameters, and New or worsened symptom related to HTN, HLD, and DM2  Call care management team with questions or concerns  Verbalize basic understanding of patient centered plan of care established today  Interventions related to HTN, HLD, and DMII:   Evaluation of current treatment plans and patient's adherence to plan as established by provider  Assessed patient understanding of disease states  Assessed patient's education and care coordination needs  Provided disease specific education to patient   Collaborated with appropriate clinical care team members regarding patient needs  Patient Self Care Activities related to HTN, HLD, and DMII:   Patient  is unable to independently self-manage chronic health conditions  Initial goal documentation         Mr. Chevez was given information about Chronic Care Management services today including:  1. CCM service includes personalized support from designated clinical staff supervised by his physician, including individualized plan of care and coordination with other care providers 2. 24/7 contact phone numbers for assistance for urgent and routine care needs. 3. Service will only be billed when office clinical staff spend 20 minutes or more in a month to coordinate care. 4. Only one practitioner may furnish and bill the service in a calendar month. 5. The patient may stop CCM services at any time (effective at the end of the month) by phone call to the office staff. 6. The patient will be  responsible for cost sharing (co-pay) of up to 20% of the service fee (after annual deductible is met).  Patient agreed to services and verbal consent obtained.   Plan:   Telephone follow up appointment with care management team member scheduled for:08-03-2019 at 1:30 pm  Noreene Larsson RN, MSN, [facility] Mobile: [phone]

## 2019-06-11 NOTE — Patient Instructions (Signed)
Visit Information  Goals Addressed            This Visit's Progress   . RNCM: I am having a lot of pain especially in my left knee       Current Barriers:  [name] Knowledge Deficits related to pain control in left knee and other pain due to chronic pain/osteoarthritis . Impaired mobility due to pain in left knee  Nurse Case Manager Clinical Goal(s):  [name] Over the next 90 days, patient will verbalize understanding of plan for pain control for chronic pain  . Over the next 90 days, patient will work with pcp and CCM team to address needs related to best options for pain relief in patient with chronic pain issues . Over the next 90 days, patient will demonstrate a decrease in pain  exacerbations as evidenced by pain level being less than 6 on a consistent daily basis . Over the next 90 days, patient will attend all scheduled medical appointments: with orthopedic provider and pcp . Over the next 90 days, patient will demonstrate understanding of rationale for each prescribed medication as evidenced by compliance and taking recommended dosages . Over the next 90 days, patient will work with CM team pharmacist to aide in answering questions and concerns about pain medications and safe administration . Over the next 90 days, the patient will demonstrate ongoing self health care management ability as evidenced by decreased pain level, no falls, and increased mobility  Interventions:  . Evaluation of current treatment plan related to unresolved pain due to arthritis worse in the left knee and patient's adherence to plan as established by provider. . Advised patient to call the orthopedic provider for additional recommendations. Patient states the orthopedic doctor can not do anything else for his knee for 3 months . Reviewed medications with patient and discussed compliance, the patient wants to know if he can take something different or more that prescribed (Referred to pcp and pharmacist) . Collaborated  with pcp and CCM pharmacist  regarding pain control issues . Discussed plans with patient for ongoing care management follow up and provided patient with direct contact information for care management team . Reviewed scheduled/upcoming provider appointments including: 06-30-2019 at 245 pm with pcp . Pharmacy referral for questions about pain medications and polypharmacy   Patient Self Care Activities:  . Patient verbalizes understanding of plan to contact pcp and pharmacist to assist with unresolved pain  . Self administers medications as prescribed . Attends all scheduled provider appointments . Calls provider office for new concerns or questions . Unable to independently control pain due to arthritis, especially in left knee . Unable to perform IADLs independently (currently having to use a cane when ambulating)  Initial goal documentation     . RNCM: I try to take my bp and blood sugar readings daily       Current Barriers:  . Chronic Disease Management support, education, and care coordination needs related to HTN, HLD, and DMII  Clinical Goal(s) related to HTN, HLD, and DMII:  Over the next 90 days, patient will:  . Work with the care management team to address educational, disease management, and care coordination needs  . Begin or continue self health monitoring activities as directed today Measure and record cbg (blood glucose) BID times daily, Measure and record blood pressure 7 times per week, and institue a hearth healthy/ADA diet . Call provider office for new or worsened signs and symptoms Blood glucose findings outside established parameters, Blood pressure findings  outside established parameters, and New or worsened symptom related to HTN, HLD, and DM2 . Call care management team with questions or concerns . Verbalize basic understanding of patient centered plan of care established today  Interventions related to HTN, HLD, and DMII:  . Evaluation of current treatment plans  and patient's adherence to plan as established by provider . Assessed patient understanding of disease states . Assessed patient's education and care coordination needs . Provided disease specific education to patient  . Collaborated with appropriate clinical care team members regarding patient needs  Patient Self Care Activities related to HTN, HLD, and DMII:  . Patient is unable to independently self-manage chronic health conditions  Initial goal documentation        Jason Contreras was given information about Chronic Care Management services today including:  1. CCM service includes personalized support from designated clinical staff supervised by his physician, including individualized plan of care and coordination with other care providers 2. 24/7 contact phone numbers for assistance for urgent and routine care needs. 3. Service will only be billed when office clinical staff spend 20 minutes or more in a month to coordinate care. 4. Only one practitioner may furnish and bill the service in a calendar month. 5. The patient may stop CCM services at any time (effective at the end of the month) by phone call to the office staff. 6. The patient will be responsible for cost sharing (co-pay) of up to 20% of the service fee (after annual deductible is met).  Patient agreed to services and verbal consent obtained.   The patient verbalized understanding of instructions provided today and declined a print copy of patient instruction materials.   Telephone follow up appointment with care management team member scheduled for:08-03-2019 at 1:30pm   Noreene Larsson RN, MSN, [facility] Mobile: [phone]   DASH Eating Plan DASH stands for "Dietary Approaches to Stop Hypertension." The DASH eating plan is a healthy eating plan that has been shown to reduce high blood pressure (hypertension). It may also reduce your risk  for type 2 diabetes, heart disease, and stroke. The DASH eating plan may also help with weight loss. What are tips for following this plan?  General guidelines  Avoid eating more than 2,300 mg (milligrams) of salt (sodium) a day. If you have hypertension, you may need to reduce your sodium intake to 1,500 mg a day.  Limit alcohol intake to no more than 1 drink a day for nonpregnant women and 2 drinks a day for men. One drink equals 12 oz of beer, 5 oz of wine, or 1 oz of hard liquor.  Work with your health care provider to maintain a healthy body weight or to lose weight. Ask what an ideal weight is for you.  Get at least 30 minutes of exercise that causes your heart to beat faster (aerobic exercise) most days of the week. Activities may include walking, swimming, or biking.  Work with your health care provider or diet and nutrition specialist (dietitian) to adjust your eating plan to your individual calorie needs. Reading food labels   Check food labels for the amount of sodium per serving. Choose foods with less than 5 percent of the Daily Value of sodium. Generally, foods with less than 300 mg of sodium per serving fit into this eating plan.  To find whole grains, look for the word "whole" as the first word in the ingredient list. Shopping  Buy products labeled as "low-sodium" or "no salt added."  Buy fresh foods. Avoid canned foods and premade or frozen meals. Cooking  Avoid adding salt when cooking. Use salt-free seasonings or herbs instead of table salt or sea salt. Check with your health care provider or pharmacist before using salt substitutes.  Do not fry foods. Cook foods using healthy methods such as baking, boiling, grilling, and broiling instead.  Cook with heart-healthy oils, such as olive, canola, soybean, or sunflower oil. Meal planning  Eat a balanced diet that includes: ? 5 or more servings of fruits and vegetables each day. At each meal, try to fill half of your  plate with fruits and vegetables. ? Up to 6-8 servings of whole grains each day. ? Less than 6 oz of lean meat, poultry, or fish each day. A 3-oz serving of meat is about the same size as a deck of cards. One egg equals 1 oz. ? 2 servings of low-fat dairy each day. ? A serving of nuts, seeds, or beans 5 times each week. ? Heart-healthy fats. Healthy fats called Omega-3 fatty acids are found in foods such as flaxseeds and coldwater fish, like sardines, salmon, and mackerel.  Limit how much you eat of the following: ? Canned or prepackaged foods. ? Food that is high in trans fat, such as fried foods. ? Food that is high in saturated fat, such as fatty meat. ? Sweets, desserts, sugary drinks, and other foods with added sugar. ? Full-fat dairy products.  Do not salt foods before eating.  Try to eat at least 2 vegetarian meals each week.  Eat more home-cooked food and less restaurant, buffet, and fast food.  When eating at a restaurant, ask that your food be prepared with less salt or no salt, if possible. What foods are recommended? The items listed may not be a complete list. Talk with your dietitian about what dietary choices are best for you. Grains Whole-grain or whole-wheat bread. Whole-grain or whole-wheat pasta. Brown rice. [place]. Bulgur. Whole-grain and low-sodium cereals. Pita bread. Low-fat, low-sodium crackers. Whole-wheat flour tortillas. Vegetables Fresh or frozen vegetables (raw, steamed, roasted, or grilled). Low-sodium or reduced-sodium tomato and vegetable juice. Low-sodium or reduced-sodium tomato sauce and tomato paste. Low-sodium or reduced-sodium canned vegetables. Fruits All fresh, dried, or frozen fruit. Canned fruit in natural juice (without added sugar). Meat and other protein foods Skinless chicken or [place]. Ground chicken or [place]. Pork with fat trimmed off. Fish and seafood. Egg whites. Dried beans, peas, or lentils. Unsalted nuts, nut butters, and  seeds. Unsalted canned beans. Lean cuts of beef with fat trimmed off. Low-sodium, lean deli meat. Dairy Low-fat (1%) or fat-free (skim) milk. Fat-free, low-fat, or reduced-fat cheeses. Nonfat, low-sodium ricotta or cottage cheese. Low-fat or nonfat yogurt. Low-fat, low-sodium cheese. Fats and oils Soft margarine without trans fats. Vegetable oil. Low-fat, reduced-fat, or light mayonnaise and salad dressings (reduced-sodium). Canola, safflower, olive, soybean, and sunflower oils. Avocado. Seasoning and other foods Herbs. Spices. Seasoning mixes without salt. Unsalted popcorn and pretzels. Fat-free sweets. What foods are not recommended? The items listed may not be a complete list. Talk with your dietitian about what dietary choices are best for you. Grains Baked goods made with fat, such as croissants, muffins, or some breads. Dry pasta or rice meal packs. Vegetables Creamed or fried vegetables. Vegetables in a cheese sauce. Regular canned vegetables (not low-sodium or reduced-sodium). Regular canned tomato sauce and paste (not low-sodium or reduced-sodium). Regular tomato and vegetable juice (not  low-sodium or reduced-sodium). Angie Fava. Olives. Fruits Canned fruit in a light or heavy syrup. Fried fruit. Fruit in cream or butter sauce. Meat and other protein foods Fatty cuts of meat. Ribs. Fried meat. Berniece Salines. Sausage. Bologna and other processed lunch meats. Salami. Fatback. Hotdogs. Bratwurst. Salted nuts and seeds. Canned beans with added salt. Canned or smoked fish. Whole eggs or egg yolks. Chicken or [place] with skin. Dairy Whole or 2% milk, cream, and half-and-half. Whole or full-fat cream cheese. Whole-fat or sweetened yogurt. Full-fat cheese. Nondairy creamers. Whipped toppings. Processed cheese and cheese spreads. Fats and oils Butter. Stick margarine. Lard. Shortening. Ghee. Bacon fat. Tropical oils, such as coconut, palm kernel, or palm oil. Seasoning and other foods Salted popcorn and  pretzels. Onion salt, garlic salt, seasoned salt, table salt, and sea salt. Worcestershire sauce. Tartar sauce. Barbecue sauce. Teriyaki sauce. Soy sauce, including reduced-sodium. Steak sauce. Canned and packaged gravies. Fish sauce. Oyster sauce. Cocktail sauce. Horseradish that you find on the shelf. Ketchup. Mustard. Meat flavorings and tenderizers. Bouillon cubes. Hot sauce and Tabasco sauce. Premade or packaged marinades. Premade or packaged taco seasonings. Relishes. Regular salad dressings. Where to find more information:  National Heart, Lung, and [place]: https://wilson-eaton.com/  American Heart Association: www.heart.org Summary  The DASH eating plan is a healthy eating plan that has been shown to reduce high blood pressure (hypertension). It may also reduce your risk for type 2 diabetes, heart disease, and stroke.  With the DASH eating plan, you should limit salt (sodium) intake to 2,300 mg a day. If you have hypertension, you may need to reduce your sodium intake to 1,500 mg a day.  When on the DASH eating plan, aim to eat more fresh fruits and vegetables, whole grains, lean proteins, low-fat dairy, and heart-healthy fats.  Work with your health care provider or diet and nutrition specialist (dietitian) to adjust your eating plan to your individual calorie needs. This information is not intended to replace advice given to you by your health care provider. Make sure you discuss any questions you have with your health care provider. Document Revised: 04/04/2017 Document Reviewed: 04/15/2016 Elsevier Patient Education  [place].  Arthritis Arthritis means joint pain. It can also mean joint disease. A joint is a place where bones come together. There are more than 100 types of arthritis. What are the causes? This condition may be caused by:  Wear and tear of a joint. This is the most common cause.  A lot of acid in the blood, which leads to pain in the joint  (gout).  Pain and swelling (inflammation) in a joint.  Infection of a joint.  Injuries in the joint.  A reaction to medicines (allergy). In some cases, the cause may not be known. What are the signs or symptoms? Symptoms of this condition include:  Redness at a joint.  Swelling at a joint.  Stiffness at a joint.  Warmth coming from the joint.  A fever.  A feeling of being sick. How is this treated? This condition may be treated with:  Treating the cause, if it is known.  Rest.  Raising (elevating) the joint.  Putting cold or hot packs on the joint.  Medicines to treat symptoms and reduce pain and swelling.  Shots of medicines (cortisone) into the joint. You may also be told to make changes in your life, such as doing exercises and losing weight. Follow these instructions at home: Medicines  Take over-the-counter and prescription medicines only as told by  your doctor.  Do not take aspirin for pain if your doctor says that you may have gout. Activity  Rest your joint if your doctor tells you to.  Avoid activities that make the pain worse.  Exercise your joint regularly as told by your doctor. Try doing exercises like: ? Swimming. ? Water aerobics. ? Biking. ? Walking. Managing pain, stiffness, and swelling      If told, put ice on the affected area. ? Put ice in a plastic bag. ? Place a towel between your skin and the bag. ? Leave the ice on for 20 minutes, 2-3 times per day.  If your joint is swollen, raise (elevate) it above the level of your heart if told by your doctor.  If your joint feels stiff in the morning, try taking a warm shower.  If told, put heat on the affected area. Do this as often as told by your doctor. Use the heat source that your doctor recommends, such as a moist heat pack or a heating pad. If you have diabetes, do not apply heat without asking your doctor. To apply heat: ? Place a towel between your skin and the heat  source. ? Leave the heat on for 20-30 minutes. ? Remove the heat if your skin turns bright red. This is very important if you are unable to feel pain, heat, or cold. You may have a greater risk of getting burned. General instructions  Do not use any products that contain nicotine or tobacco, such as cigarettes, e-cigarettes, and chewing tobacco. If you need help quitting, ask your doctor.  Keep all follow-up visits as told by your doctor. This is important. Contact a doctor if:  The pain gets worse.  You have a fever. Get help right away if:  You have very bad pain in your joint.  You have swelling in your joint.  Your joint is red.  Many joints become painful and swollen.  You have very bad back pain.  Your leg is very weak.  You cannot control your pee (urine) or poop (stool). Summary  Arthritis means joint pain. It can also mean joint disease. A joint is a place where bones come together.  The most common cause of this condition is wear and tear of a joint.  Symptoms of this condition include redness, swelling, or stiffness of the joint.  This condition is treated with rest, raising the joint, medicines, and putting cold or hot packs on the joint.  Follow your doctor's instructions about medicines, activity, exercises, and other home care treatments. This information is not intended to replace advice given to you by your health care provider. Make sure you discuss any questions you have with your health care provider. Document Revised: 03/30/2018 Document Reviewed: 03/30/2018 Elsevier Patient Education  2020 [employer].

## 2019-06-14 ENCOUNTER — Telehealth: Payer: Self-pay | Admitting: Nurse Practitioner

## 2019-06-14 NOTE — Chronic Care Management (AMB) (Signed)
°  Chronic Care Management   Note  06/14/2019 Name: Jason Contreras MRN: [id number] DOB: 25-Aug-1940  Jason Contreras is a 79 y.o. year old male who is a primary care patient of [name], [name], NP. Jason Contreras is currently enrolled in care management services. An additional referral for Pharm D was placed.   Follow up plan: Telephone appointment with care management team member scheduled for:07/09/2019  Noreene Larsson, [place], [place], [place] [id number] Direct Dial: [phone] [email or web address] Website: [email or web address]

## 2019-06-16 ENCOUNTER — Ambulatory Visit: Payer: Self-pay | Admitting: General Practice

## 2019-06-16 ENCOUNTER — Ambulatory Visit: Payer: Self-pay | Admitting: Pharmacist

## 2019-06-16 ENCOUNTER — Other Ambulatory Visit: Payer: Self-pay | Admitting: Nurse Practitioner

## 2019-06-16 DIAGNOSIS — I10 Essential (primary) hypertension: Secondary | ICD-10-CM | POA: Diagnosis not present

## 2019-06-16 DIAGNOSIS — M25541 Pain in joints of right hand: Secondary | ICD-10-CM

## 2019-06-16 DIAGNOSIS — M1990 Unspecified osteoarthritis, unspecified site: Secondary | ICD-10-CM

## 2019-06-16 DIAGNOSIS — M47817 Spondylosis without myelopathy or radiculopathy, lumbosacral region: Secondary | ICD-10-CM

## 2019-06-16 DIAGNOSIS — E0821 Diabetes mellitus due to underlying condition with diabetic nephropathy: Secondary | ICD-10-CM

## 2019-06-16 DIAGNOSIS — G8929 Other chronic pain: Secondary | ICD-10-CM

## 2019-06-16 DIAGNOSIS — E785 Hyperlipidemia, unspecified: Secondary | ICD-10-CM

## 2019-06-16 DIAGNOSIS — E559 Vitamin D deficiency, unspecified: Secondary | ICD-10-CM

## 2019-06-16 DIAGNOSIS — E1169 Type 2 diabetes mellitus with other specified complication: Secondary | ICD-10-CM

## 2019-06-16 DIAGNOSIS — M25542 Pain in joints of left hand: Secondary | ICD-10-CM

## 2019-06-16 NOTE — Progress Notes (Signed)
Lab visit for chronic pain and assess for RA.

## 2019-06-16 NOTE — Chronic Care Management (AMB) (Signed)
Chronic Care Management   Follow Up Note   06/16/2019 Name: Jason Contreras MRN: [id number] DOB: 1941-02-07  Referred by: Jason Lick, NP Reason for referral : Chronic Care Management (Call back for question about medication/ Chronic Disease Management and care coordination needs)   Jason Contreras is a 79 y.o. year old male who is a primary care patient of [name], Jason Faster, NP. The CCM team was consulted for assistance with chronic disease management and care coordination needs.    Review of patient status, including review of consultants reports, relevant laboratory and other test results, and collaboration with appropriate care team members and the patient's provider was performed as part of comprehensive patient evaluation and provision of chronic care management services.    SDOH (Social Determinants of Health) screening performed today: Physical Activity None. See Care Plan for related entries.   Outpatient Encounter Medications as of 06/16/2019  Medication Sig Note   acetaminophen (TYLENOL) 500 MG tablet Take 1,000 mg by mouth every 6 (six) hours as needed.    atorvastatin (LIPITOR) 20 MG tablet TAKE 1 TABLET BY MOUTH  DAILY AT 6 PM.    carvedilol (COREG) 6.25 MG tablet TAKE 1 TABLET BY MOUTH TWO  TIMES DAILY    DULoxetine (CYMBALTA) 60 MG capsule Take 1 capsule (60 mg total) by mouth daily.    gabapentin (NEURONTIN) 600 MG tablet Take 1 tablet (600 mg total) by mouth 3 (three) times daily.    glimepiride (AMARYL) 4 MG tablet TAKE ONE-HALF TABLET BY  MOUTH DAILY    lidocaine (XYLOCAINE) 5 % ointment Apply 1 application topically 3 (three) times daily as needed. 06/11/2019: Uses on his back but does not seem to help   losartan (COZAAR) 100 MG tablet TAKE 1 TABLET BY MOUTH  DAILY (Patient taking differently: 50 mg. )    metFORMIN (GLUCOPHAGE) 500 MG tablet TAKE 2 TABLETS BY MOUTH TWO TIMES DAILY    mometasone (ELOCON) 0.1 % cream APPLY  CREAM TOPICALLY ONCE DAILY     tamsulosin (FLOMAX) 0.4 MG CAPS capsule TAKE 1 CAPSULE BY MOUTH  DAILY    vitamin B-12 (CYANOCOBALAMIN) 1000 MCG tablet Take 1,000 mcg by mouth daily.    No facility-administered encounter medications on file as of 06/16/2019.     Objective:   Goals Addressed            This Visit's Progress    RNCM: I am having a lot of pain especially in my left knee       Current Barriers:   Knowledge Deficits related to pain control in left knee and other pain due to chronic pain/osteoarthritis  Impaired mobility due to pain in left knee  Nurse Case Manager Clinical Goal(s):   Over the next 90 days, patient will verbalize understanding of plan for pain control for chronic pain   Over the next 90 days, patient will work with pcp and CCM team to address needs related to best options for pain relief in patient with chronic pain issues  Over the next 90 days, patient will demonstrate a decrease in pain  exacerbations as evidenced by pain level being less than 6 on a consistent daily basis  Over the next 90 days, patient will attend all scheduled medical appointments: with orthopedic provider and pcp  Over the next 90 days, patient will demonstrate understanding of rationale for each prescribed medication as evidenced by compliance and taking recommended dosages  Over the next 90 days, patient will work with CM  team pharmacist to aide in answering questions and concerns about pain medications and safe administration  Over the next 90 days, the patient will demonstrate ongoing self health care management ability as evidenced by decreased pain level, no falls, and increased mobility  Interventions:   Evaluation of current treatment plan related to unresolved pain due to arthritis worse in the left knee and patient's adherence to plan as established by provider.  Advised patient to call the orthopedic provider for additional recommendations. Patient states the orthopedic doctor can not do  anything else for his knee for 3 months  Reviewed medications with patient and discussed compliance, the patient wants to know if he can take something different or more that prescribed (Referred to pcp and pharmacist)  Collaborated with pcp and CCM pharmacist  regarding pain control issues.  CCM pharmacist had a co-visit today via telephone with the patient daughter Jason Contreras who wanted to know if a Remicade infusion would help her father. The daughter reports that the patient can not even open a bottle now without pain.  She feels he is not getting any better with the current treatment regimen  Discussed plans with patient for ongoing care management follow up and provided patient with direct contact information for care management team  Reviewed scheduled/upcoming provider appointments including: 06-30-2019 at 245 pm with pcp  Pharmacy referral for questions about pain medications and polypharmacy.  CCM pharmacist spoke today with the patient daughter on a co-visit call with the CM.  Will further collaborate with the pcp for recommendations.   Patient Self Care Activities:   Patient verbalizes understanding of plan to contact pcp and pharmacist to assist with unresolved pain   Self administers medications as prescribed  Attends all scheduled provider appointments  Calls provider office for new concerns or questions  Unable to independently control pain due to arthritis, especially in left knee  Unable to perform IADLs independently (currently having to use a cane when ambulating)  Please see past updates related to this goal by clicking on the "Past Updates" button in the selected goal          Plan:   Telephone follow up appointment with care management team member scheduled for:08-03-2019 at 1:30pm or sooner if warranted   Noreene Larsson RN, MSN, [facility] Mobile: [phone]

## 2019-06-16 NOTE — Patient Instructions (Signed)
Visit Information  Goals Addressed            This Visit's Progress     Patient Stated   . PharmD "His pain is terrible" (pt-stated)       Current Barriers:  . Polypharmacy; complex patient with multiple comorbidities including HTN, HLD, arthritis, T2DM . Self-manages medications. Daughter, Bernerd Limbo, is very involved in his care. Called RN CM with medication questions that I assisted in answering.  . Per chart review, previous [facility] pharmacist assisted in application for Medicare Extra Help. Approved.  o Arthritis: reports significant knee pain, and daughter notes he can no longer open jars or make a fist due to pain in his hands. Continues to lose weight, and pain is negatively impacting his QoL. Previously noted to RN CM that orthopedics could not do another knee injection for another 3 months. Daughter notes NO benefit from increased duloxetine and gabapentin doses at last PCP visit. Duloxetine 60 mg daily, gabapentin 600 mg TID, APAP 1000 mg Q6H (using max dose of 4 g every day). Daughter asks today about Remicaide infusions. Noted at last visit w/ PCP to consider pain management referral. o T2DM: A1c 5.8%, pioglitazone d/c. Continues on metformin 1000 mg BID and glimepiride 2 mg daily.  o HTN: losartan 100 mg daily, carvedilol 6.25 mg BID; BP at goal at last office visit o ASCVD risk reduction: atorvastatin 20 mg daily; last LDL at goal <70  Pharmacist Clinical Goal(s):  [name] Over the next 90 days, patient will work with PharmD and provider towards optimized medication management  Interventions: . Educated Melannia on rheumatoid/psoriatic arthritis vs osteoarthritis, and that Remicaide infusions target autoimmune diseases, and would not provide benefit in osteoarthritis. Noted that I would collaborate w/ PCP on next steps for treatment.  Patient Self Care Activities:  . Patient will take medications as prescribed  Initial goal documentation        The patient verbalized  understanding of instructions provided today and declined a print copy of patient instruction materials.   Plan: - Will outreach patient for full med review as previously scheduled  Catie Darnelle Maffucci, PharmD, [place] [phone]

## 2019-06-16 NOTE — Chronic Care Management (AMB) (Signed)
Chronic Care Management   Note  06/16/2019 Name: Jason Contreras MRN: [id number] DOB: May 05, 1941   Subjective:  WELLINGTON AMLIN is a 79 y.o. year old male who is a primary care patient of [name], [name], NP. The CCM team was consulted for assistance with chronic disease management and care coordination needs.    Patient's daughter, Edythe Lynn, called RN CM w/ questions about possible arthritis treatment. RN CM asked me to be a part of the conversation to answer questions.   Review of patient status, including review of consultants reports, laboratory and other test data, was performed as part of comprehensive evaluation and provision of chronic care management services.   Objective:  Lab Results  Component Value Date   CREATININE 0.70 (L) 05/28/2019   CREATININE 1.01 10/13/2018   CREATININE 1.01 03/12/2018    Lab Results  Component Value Date   HGBA1C 5.8 05/28/2019       Component Value Date/Time   CHOL 97 (L) 05/28/2019 1147   CHOL 101 10/13/2018 0901   TRIG 89 05/28/2019 1147   TRIG 91 10/13/2018 0901   HDL 41 05/28/2019 1147   CHOLHDL 2.5 03/12/2018 1356   VLDL 18 10/13/2018 0901   LDLCALC 38 05/28/2019 1147    Clinical ASCVD: No  The ASCVD Risk score Mikey Bussing DC Jr., et al., 2013) failed to calculate for the following reasons:   The valid total cholesterol range is 130 to 320 mg/dL    BP Readings from Last 3 Encounters:  06/11/19 138/80  05/28/19 111/69  01/21/19 140/74    Allergies  Allergen Reactions  . Morphine And Related Nausea And Vomiting    Medications Reviewed Today    Reviewed by Venita Lick, NP (Nurse Practitioner) on 05/28/19 at [phone]  Med List Status: <None>  Medication Order Taking? Sig Documenting Provider Last Dose Status Informant  acetaminophen (TYLENOL) 325 MG tablet [id number] Yes Take 650 mg by mouth daily as needed for headache. [provider] Taking Active Self  atorvastatin (LIPITOR) 20 MG tablet [id number] Yes TAKE 1  TABLET BY MOUTH  DAILY AT 6 PM. [name], MD Taking Active   carvedilol (COREG) 6.25 MG tablet [id number] Yes TAKE 1 TABLET BY MOUTH TWO  TIMES DAILY Crissman, Jeannette How, MD Taking Active   DULoxetine (CYMBALTA) 60 MG capsule [id number]  Take 1 capsule (60 mg total) by mouth daily. [name] T, NP  Active   gabapentin (NEURONTIN) 300 MG capsule [id number] Yes TAKE 1 CAPSULE BY MOUTH 4  TIMES DAILY Crissman, Jeannette How, MD Taking Active   glimepiride (AMARYL) 4 MG tablet [id number] Yes TAKE ONE-HALF TABLET BY  MOUTH DAILY Crissman, Jeannette How, MD Taking Active   lidocaine (XYLOCAINE) 5 % ointment [id number] No Apply 1 application topically 3 (three) times daily as needed.  Patient not taking: Reported on 05/28/2019   [name], MD Not Taking Active   losartan (COZAAR) 100 MG tablet [id number] Yes TAKE 1 TABLET BY MOUTH  DAILY [name], MD Taking Active   metFORMIN (GLUCOPHAGE) 500 MG tablet [id number] Yes TAKE 2 TABLETS BY MOUTH TWO TIMES DAILY [name], MD Taking Active   mometasone (ELOCON) 0.1 % cream [id number] Yes APPLY  CREAM TOPICALLY ONCE DAILY [name], MD Taking Active Self  tamsulosin (FLOMAX) 0.4 MG CAPS capsule [id number] Yes TAKE 1 CAPSULE BY MOUTH  DAILY Crissman, Jeannette How, MD Taking Active            Assessment:  Goals Addressed            This Visit's Progress     Patient Stated   . PharmD "His pain is terrible" (pt-stated)       Current Barriers:  . Polypharmacy; complex patient with multiple comorbidities including HTN, HLD, arthritis, T2DM . Self-manages medications. Daughter, Bernerd Limbo, is very involved in his care. Called RN CM with medication questions that I assisted in answering.  . Per chart review, previous [facility] pharmacist assisted in application for Medicare Extra Help. Approved.  o Arthritis: reports significant knee pain, and daughter notes he can no longer open jars or make a fist due to pain in his hands. Continues to lose weight,  and pain is negatively impacting his QoL. Previously noted to RN CM that orthopedics could not do another knee injection for another 3 months. Daughter notes NO benefit from increased duloxetine and gabapentin doses at last PCP visit. Duloxetine 60 mg daily, gabapentin 600 mg TID, APAP 1000 mg Q6H (using max dose of 4 g every day). Daughter asks today about Remicaide infusions. Noted at last visit w/ PCP to consider pain management referral. o T2DM: A1c 5.8%, pioglitazone d/c. Continues on metformin 1000 mg BID and glimepiride 2 mg daily.  o HTN: losartan 100 mg daily, carvedilol 6.25 mg BID; BP at goal at last office visit o ASCVD risk reduction: atorvastatin 20 mg daily; last LDL at goal <70  Pharmacist Clinical Goal(s):  [name] Over the next 90 days, patient will work with PharmD and provider towards optimized medication management  Interventions: . Educated Melannia on rheumatoid/psoriatic arthritis vs osteoarthritis, and that Remicaide infusions target autoimmune diseases, and would not provide benefit in osteoarthritis. Noted that I would collaborate w/ PCP on next steps for treatment.  Patient Self Care Activities:  . Patient will take medications as prescribed  Initial goal documentation        Plan: - Will outreach patient for full med review as previously scheduled  Catie Darnelle Maffucci, PharmD, [place] [phone]

## 2019-06-16 NOTE — Patient Instructions (Signed)
Visit Information  Goals Addressed            This Visit's Progress   . RNCM: I am having a lot of pain especially in my left knee       Current Barriers:  [name] Knowledge Deficits related to pain control in left knee and other pain due to chronic pain/osteoarthritis . Impaired mobility due to pain in left knee  Nurse Case Manager Clinical Goal(s):  [name] Over the next 90 days, patient will verbalize understanding of plan for pain control for chronic pain  . Over the next 90 days, patient will work with pcp and CCM team to address needs related to best options for pain relief in patient with chronic pain issues . Over the next 90 days, patient will demonstrate a decrease in pain  exacerbations as evidenced by pain level being less than 6 on a consistent daily basis . Over the next 90 days, patient will attend all scheduled medical appointments: with orthopedic provider and pcp . Over the next 90 days, patient will demonstrate understanding of rationale for each prescribed medication as evidenced by compliance and taking recommended dosages . Over the next 90 days, patient will work with CM team pharmacist to aide in answering questions and concerns about pain medications and safe administration . Over the next 90 days, the patient will demonstrate ongoing self health care management ability as evidenced by decreased pain level, no falls, and increased mobility  Interventions:  . Evaluation of current treatment plan related to unresolved pain due to arthritis worse in the left knee and patient's adherence to plan as established by provider. . Advised patient to call the orthopedic provider for additional recommendations. Patient states the orthopedic doctor can not do anything else for his knee for 3 months . Reviewed medications with patient and discussed compliance, the patient wants to know if he can take something different or more that prescribed (Referred to pcp and pharmacist) . Collaborated  with pcp and CCM pharmacist  regarding pain control issues.  CCM pharmacist had a co-visit today via telephone with the patient daughter [name] who wanted to know if a Remicade infusion would help her father. The daughter reports that the patient can not even open a bottle now without pain.  She feels he is not getting any better with the current treatment regimen . Discussed plans with patient for ongoing care management follow up and provided patient with direct contact information for care management team . Reviewed scheduled/upcoming provider appointments including: 06-30-2019 at 245 pm with pcp . Pharmacy referral for questions about pain medications and polypharmacy.  CCM pharmacist spoke today with the patient daughter on a co-visit call with the CM.  Will further collaborate with the pcp for recommendations.   Patient Self Care Activities:  . Patient verbalizes understanding of plan to contact pcp and pharmacist to assist with unresolved pain  . Self administers medications as prescribed . Attends all scheduled provider appointments . Calls provider office for new concerns or questions . Unable to independently control pain due to arthritis, especially in left knee . Unable to perform IADLs independently (currently having to use a cane when ambulating)  Please see past updates related to this goal by clicking on the "Past Updates" button in the selected goal         The patient verbalized understanding of instructions provided today and declined a print copy of patient instruction materials.   Telephone follow up appointment with care management team  member scheduled for: 08-03-2019 at 1:30 pm  Noreene Larsson RN, MSN, [facility] Mobile: [phone]

## 2019-06-17 ENCOUNTER — Other Ambulatory Visit: Payer: Self-pay

## 2019-06-17 ENCOUNTER — Other Ambulatory Visit: Payer: Medicare Other

## 2019-06-17 ENCOUNTER — Other Ambulatory Visit: Payer: Self-pay | Admitting: Nurse Practitioner

## 2019-06-17 DIAGNOSIS — M25542 Pain in joints of left hand: Secondary | ICD-10-CM | POA: Diagnosis not present

## 2019-06-17 DIAGNOSIS — E559 Vitamin D deficiency, unspecified: Secondary | ICD-10-CM | POA: Diagnosis not present

## 2019-06-17 DIAGNOSIS — M47817 Spondylosis without myelopathy or radiculopathy, lumbosacral region: Secondary | ICD-10-CM

## 2019-06-17 DIAGNOSIS — M25541 Pain in joints of right hand: Secondary | ICD-10-CM | POA: Diagnosis not present

## 2019-06-17 NOTE — Progress Notes (Signed)
Spoke to patient on phone.  He has requested out of home PT and not home health.  Will place order for PT out of home.

## 2019-06-18 ENCOUNTER — Telehealth: Payer: Self-pay | Admitting: Nurse Practitioner

## 2019-06-18 DIAGNOSIS — M1990 Unspecified osteoarthritis, unspecified site: Secondary | ICD-10-CM

## 2019-06-18 DIAGNOSIS — R7982 Elevated C-reactive protein (CRP): Secondary | ICD-10-CM

## 2019-06-18 LAB — SEDIMENTATION RATE: Sed Rate: 30 mm/h (ref 0–30)

## 2019-06-18 LAB — C-REACTIVE PROTEIN: CRP: 36 mg/L — ABNORMAL HIGH (ref 0–10)

## 2019-06-18 LAB — ANA W/REFLEX IF POSITIVE: Anti Nuclear Antibody (ANA): NEGATIVE

## 2019-06-18 LAB — VITAMIN D 25 HYDROXY (VIT D DEFICIENCY, FRACTURES): Vit D, 25-Hydroxy: 10.1 ng/mL — ABNORMAL LOW (ref 30.0–100.0)

## 2019-06-18 LAB — RHEUMATOID FACTOR: Rheumatoid fact SerPl-aCnc: <10 [IU]/mL (ref 0.0–13.9)

## 2019-06-18 LAB — URIC ACID: Uric Acid: 5.3 mg/dL (ref 3.8–8.4)

## 2019-06-18 MED ORDER — CHOLECALCIFEROL 1.25 MG (50000 UT) PO TABS: 1.0000 | ORAL_TABLET | ORAL | 0 refills | Status: DC

## 2019-06-18 NOTE — Telephone Encounter (Signed)
Spoke to patient on telephone, will refer to rheumatology for further assessment due to chronic and worsening joint pain in hands, wrists, arms, knees.  Recent labs did note elevation in CRP and high normal ESR, Rf and ANA normal.  Vitamin D low, will send in weekly high dose to take and recheck in 8 weeks.  Discussed at length with patient.  He stated appreciation for assistance as has been dealing with pain for years and would feel better seeing rheumatology to ensure no other underlying causes.

## 2019-06-18 NOTE — Progress Notes (Signed)
Spoke to patient on telephone, review telephone note

## 2019-06-21 ENCOUNTER — Telehealth: Payer: Self-pay | Admitting: Nurse Practitioner

## 2019-06-21 NOTE — Chronic Care Management (AMB) (Signed)
  Care Management   Note  06/21/2019 Name: Jason Contreras MRN: [id number] DOB: 20-Apr-1941  Jason Contreras is a 79 y.o. year old male who is a primary care patient of Venita Lick, NP and is actively engaged with the care management team. I reached out to Jason Contreras by phone today to assist with re-scheduling an initial visit with the Pharmacist  Follow up plan: Telephone appointment with care management team member scheduled for:07/06/2019  Noreene Larsson, [place], [place],  [id number] Direct Dial: [phone] [email or web address]: [email or web address]

## 2019-06-29 ENCOUNTER — Ambulatory Visit: Payer: Medicare Other | Attending: Nurse Practitioner

## 2019-06-29 ENCOUNTER — Other Ambulatory Visit: Payer: Self-pay

## 2019-06-29 DIAGNOSIS — G8929 Other chronic pain: Secondary | ICD-10-CM | POA: Diagnosis not present

## 2019-06-29 DIAGNOSIS — M5136 Other intervertebral disc degeneration, lumbar region: Secondary | ICD-10-CM | POA: Diagnosis not present

## 2019-06-29 DIAGNOSIS — M545 Low back pain: Secondary | ICD-10-CM | POA: Diagnosis not present

## 2019-06-29 DIAGNOSIS — M25552 Pain in left hip: Secondary | ICD-10-CM | POA: Diagnosis not present

## 2019-06-29 DIAGNOSIS — M1712 Unilateral primary osteoarthritis, left knee: Secondary | ICD-10-CM | POA: Insufficient documentation

## 2019-06-29 NOTE — Therapy (Deleted)
[facility] [address] [place], [address] Phone: [phone]   Fax:  [phone]  Physical Therapy Evaluation  Patient Details  Name: Jason Contreras MRN: [id number] Date of Birth: 07-Aug-1940 No data recorded  Encounter Date: 06/29/2019  PT End of Session - 06/29/19 1223    Visit Number  1    Number of Visits  13    Date for PT Re-Evaluation  08/24/19    Authorization Type  1/10    PT Start Time  0930    PT Stop Time  1030    PT Time Calculation (min)  60 min    Equipment Utilized During Treatment  Gait belt    Activity Tolerance  Patient limited by pain    Behavior During Therapy  WFL for tasks assessed/performed       Past Medical History:  Diagnosis Date  . Adenocarcinoma, renal cell ([place])   . Arthritis   . Diabetes mellitus without complication ([place])   . Elevated PSA   . Headache   . Hematuria   . Hyperlipidemia   . Hypertension   . Neuropathy   . Obesity   . PONV (postoperative nausea and vomiting)   . Renal insufficiency   . Right renal mass     Past Surgical History:  Procedure Laterality Date  . APPENDECTOMY    . CRYOABLATION  10/17/2017  . IR RADIOLOGIST EVAL & MGMT  07/17/2017  . IR RADIOLOGIST EVAL & MGMT  08/13/2017  . IR RADIOLOGIST EVAL & MGMT  11/12/2017  . IR RADIOLOGIST EVAL & MGMT  03/05/2018  . RADIOLOGY WITH ANESTHESIA Right 10/17/2017   Procedure: CT WITH ANESTHESIA RENAL CRYOABLATION;  Surgeon: [name], MD;  Location: [facility] ORS;  Service: Anesthesiology;  Laterality: Right;  . ROBOTIC ASSITED PARTIAL NEPHRECTOMY Right 12/12/2015   Procedure: ROBOTIC ASSITED PARTIAL NEPHRECTOMY;  Surgeon: [name], MD;  Location: [facility] ORS;  Service: Urology;  Laterality: Right;    There were no vitals filed for this visit.   Subjective Assessment - 06/29/19 1051    Subjective  Pt is a pleasant 79yo that comes into physical therapy w/ c/o: knee pain, hip pain and low back.  Knee pain has been bothering him for a little over 2 months with an insidious onset of sx. Low back pain has been bothering him for a few years but is not as bad as the knee. Pain in the knee is currently 2/10 and is worst at 9/10 in the morning. Low back pain gets up to about a 8/10 at worst. 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 Patient has difficulty w/ laying in bed w/o pain in knees hips or back, so he sleeps in the recliner and that has some relief. Patient states that pain wakes him up at night, has decreased appetite, unexplained weight loss, and hx of cancer. Patient has difficulty w/ bed mobility and requires assistance w/ getting in and out of bed.    Pertinent History  Hx of renal cancer, Family Hx of bone cancer, sudden unexplained weight loss, decreased appetite, Decreased saddle sensation, and increasing spread of pain throughout body    Limitations  Lifting;Standing    How long can you stand comfortably?  5 minutes    How long can you walk comfortably?  can't walk w/o pain  Patient Stated Goals  get back to walking pain free    Currently in Pain?  Yes    Pain Score  9     Pain Location  Knee    Pain Orientation  Left    Pain Type  Acute pain;Chronic pain    Pain Onset  More than a month ago    Pain Frequency  Constant    Aggravating Factors   walking, standing for peroids of time, stairs    Pain Relieving Factors  sitting and movement in NWB positions    Multiple Pain Sites  Yes    Pain Score  7    Pain Location  Back    Pain Orientation  Left;Right    Pain Type  Chronic pain    Pain Onset  More than a month ago          Pain:  now - 2 /10    best - 0 /10   worst - 9 /10 morning.  Back pain: 7/10  Pain description: dull achy  Timing of Pain: 2 months ago knee pain started   2-3 years ago, LBP started  Pain through day: morning pain, decreases the  more activity he does Limitations: can't lay flat In bed,   Timing for pain to arise: standing- immediate pain but able to tolerate for 5 minutes   sitting-      walking- able to do but pain comes on immediately     Stairs- able to do step to gait for both up and down  Easing fx: sitting and not moving, less pain at end of the day  Goals: walking pain free   PMH: Gout, Hx of Cancer kidney June for more CT scan  RED FLAGS:  Unexplained wt loss Decreased appetite  Hx of Cancer  >55yo  Family Hx of Cancer Decreased sensation over groin region  Decreased ability to hold bowel and bladder  Sleeps in reclines or on sides and pain in hip and knee keeps up   Falls: none Gait: antalagic gait on the left 16.65sec Balance: unable to do SLS w/o external support   Functional Tasks:   KNEE ROM   L PROM R PROM L AROM  R AROM   Flexion 105! WNL 101 WNL  Extension  20! WNL 24 WNL    HIP ROM   L PROM  R PROM  L AROM R AROM  Flexion  84 WNL  80 WNL   Extension Not tested Not tested  15 18  IR Not tested Not tested  - -  ER Not tested Not Tested  - -  Abduction Limited due to p! Not tested WNL w/ aberrant trunk movement WNL w/ aberrant trunk movement  Adduction  Limited due to p! Not tested  Not tested Not tested             Objective measurements completed on examination: See above findings.        PT Education - 06/29/19 1223    Education Details  form/technique w/ exercise, POC, Arthritis diagnosis    Person(s) Educated  Patient    Methods  Explanation;Demonstration;Tactile cues;Verbal cues    Comprehension  Verbal cues required;Returned demonstration;Verbalized understanding        Patient will benefit from skilled therapeutic intervention in order to improve the following deficits and impairments:  Abnormal gait, Decreased endurance, Decreased coordination, Decreased range of motion, Difficulty walking, Decreased activity tolerance, Impaired UE functional use,  Impaired vision/preception, Pain, Decreased  balance, Hypomobility, Decreased mobility, Decreased strength  Visit Diagnosis: DDD (degenerative disc disease), lumbar  Chronic bilateral low back pain without sciatica  Primary osteoarthritis of left knee  Pain in left hip     Problem List Patient Active Problem List   Diagnosis Date Noted  . DJD (degenerative joint disease), lumbosacral 11/04/2017  . Personal history of renal cancer 10/17/2017  . Advanced care planning/counseling discussion 01/13/2017  . Arthritis 01/13/2017  . Senile purpura ([place]) 09/26/2016  . Elevated PSA 01/07/2015  . BPH with obstruction/lower urinary tract symptoms 01/07/2015  . DM due to underlying condition with diabetic nephropathy ([place])   . Hypertension associated with diabetes ([place])   . Hyperlipidemia associated with type 2 diabetes mellitus ([place])    Blythe Stanford, PT DPT Gloriann Loan, SPT  06/30/2019, 1:21 PM  [facility] [address], [place], [address] Phone: [phone]   Fax:  [phone]  Name: Jason Contreras MRN: [id number] Date of Birth: [birth year]

## 2019-06-30 ENCOUNTER — Telehealth: Payer: Self-pay | Admitting: Urology

## 2019-06-30 ENCOUNTER — Ambulatory Visit: Payer: Medicare Other | Admitting: Nurse Practitioner

## 2019-06-30 ENCOUNTER — Ambulatory Visit: Payer: Medicare Other | Admitting: Pharmacist

## 2019-06-30 ENCOUNTER — Other Ambulatory Visit: Payer: Self-pay | Admitting: Nurse Practitioner

## 2019-06-30 DIAGNOSIS — E559 Vitamin D deficiency, unspecified: Secondary | ICD-10-CM | POA: Diagnosis not present

## 2019-06-30 DIAGNOSIS — M47817 Spondylosis without myelopathy or radiculopathy, lumbosacral region: Secondary | ICD-10-CM

## 2019-06-30 DIAGNOSIS — G894 Chronic pain syndrome: Secondary | ICD-10-CM | POA: Diagnosis not present

## 2019-06-30 DIAGNOSIS — M8949 Other hypertrophic osteoarthropathy, multiple sites: Secondary | ICD-10-CM | POA: Diagnosis not present

## 2019-06-30 DIAGNOSIS — E0821 Diabetes mellitus due to underlying condition with diabetic nephropathy: Secondary | ICD-10-CM

## 2019-06-30 DIAGNOSIS — R7982 Elevated C-reactive protein (CRP): Secondary | ICD-10-CM | POA: Insufficient documentation

## 2019-06-30 DIAGNOSIS — M2550 Pain in unspecified joint: Secondary | ICD-10-CM | POA: Diagnosis not present

## 2019-06-30 DIAGNOSIS — M159 Polyosteoarthritis, unspecified: Secondary | ICD-10-CM | POA: Insufficient documentation

## 2019-06-30 DIAGNOSIS — M7989 Other specified soft tissue disorders: Secondary | ICD-10-CM | POA: Diagnosis not present

## 2019-06-30 DIAGNOSIS — G8929 Other chronic pain: Secondary | ICD-10-CM

## 2019-06-30 DIAGNOSIS — M19042 Primary osteoarthritis, left hand: Secondary | ICD-10-CM | POA: Diagnosis not present

## 2019-06-30 DIAGNOSIS — M19041 Primary osteoarthritis, right hand: Secondary | ICD-10-CM | POA: Diagnosis not present

## 2019-06-30 MED ORDER — LIDOCAINE 5 % EX PTCH: 1.0000 | MEDICATED_PATCH | CUTANEOUS | 5 refills | Status: DC

## 2019-06-30 NOTE — Therapy (Signed)
[facility] [address], [place], [address] Phone: [phone]   Fax:  [phone]  Physical Therapy Evaluation  Patient Details  Name: Jason Contreras MRN: [id number] Date of Birth: 06/06/1940 No data recorded  Encounter Date: 06/29/2019  PT End of Session - 06/29/19 1223    Visit Number  1    Number of Visits  13    Date for PT Re-Evaluation  08/24/19    Authorization Type  1/10    PT Start Time  0930    PT Stop Time  1030    PT Time Calculation (min)  60 min    Equipment Utilized During Treatment  Gait belt    Activity Tolerance  Patient limited by pain    Behavior During Therapy  WFL for tasks assessed/performed       Past Medical History:  Diagnosis Date  . Adenocarcinoma, renal cell ([place])   . Arthritis   . Diabetes mellitus without complication ([place])   . Elevated PSA   . Headache   . Hematuria   . Hyperlipidemia   . Hypertension   . Neuropathy   . Obesity   . PONV (postoperative nausea and vomiting)   . Renal insufficiency   . Right renal mass     Past Surgical History:  Procedure Laterality Date  . APPENDECTOMY    . CRYOABLATION  10/17/2017  . IR RADIOLOGIST EVAL & MGMT  07/17/2017  . IR RADIOLOGIST EVAL & MGMT  08/13/2017  . IR RADIOLOGIST EVAL & MGMT  11/12/2017  . IR RADIOLOGIST EVAL & MGMT  03/05/2018  . RADIOLOGY WITH ANESTHESIA Right 10/17/2017   Procedure: CT WITH ANESTHESIA RENAL CRYOABLATION;  Surgeon: [name], MD;  Location: [facility];  Service: Anesthesiology;  Laterality: Right;  . ROBOTIC ASSITED PARTIAL NEPHRECTOMY Right 12/12/2015   Procedure: ROBOTIC ASSITED PARTIAL NEPHRECTOMY;  Surgeon: [name], MD;  Location: [facility] ORS;  Service: Urology;  Laterality: Right;    There were no vitals filed for this visit.   Subjective Assessment - 06/29/19 1051    Subjective  Pt is a pleasant 79yo that comes into physical therapy w/ c/o: knee pain, hip pain and low back.  Knee pain has been bothering him for a little over 2 months with an insidious onset of sx. Low back pain has been bothering him for a few years but is not as bad as the knee. Pain in the knee is currently 2/10 and is worst at 9/10 in the morning. Low back pain gets up to about a 8/10 at worst. 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 Patient has difficulty w/ laying in bed w/o pain in knees hips or back, so he sleeps in the recliner and that has some relief. Patient states that pain wakes him up at night, has decreased appetite, unexplained weight loss, and hx of cancer. Patient has difficulty w/ bed mobility and requires assistance w/ getting in and out of bed.    Pertinent History  Hx of renal cancer, Family Hx of bone cancer, sudden unexplained weight loss, decreased appetite, Decreased saddle sensation, and increasing spread of pain throughout body    Limitations  Lifting;Standing    How long can you stand comfortably?  5 minutes    How long can you walk comfortably?  can't walk w/o pain  Patient Stated Goals  get back to walking pain free    Currently in Pain?  Yes    Pain Score  9     Pain Location  Knee    Pain Orientation  Left    Pain Type  Acute pain;Chronic pain    Pain Onset  More than a month ago    Pain Frequency  Constant    Aggravating Factors   walking, standing for peroids of time, stairs    Pain Relieving Factors  sitting and movement in NWB positions    Multiple Pain Sites  Yes    Pain Score  7    Pain Location  Back    Pain Orientation  Left;Right    Pain Type  Chronic pain    Pain Onset  More than a month ago                    Objective measurements completed on examination: See above findings.              PT Education - 06/29/19 1223    Education Details  form/technique w/ exercise, POC, Arthritis diagnosis    Person(s)  Educated  Patient    Methods  Explanation;Demonstration;Tactile cues;Verbal cues    Comprehension  Verbal cues required;Returned demonstration;Verbalized understanding       PT Short Term Goals - 06/29/19 1512      PT SHORT TERM GOAL #1   Title  Patient will be independent in HEP in order to reduce number of visits    Baseline  --    Time  2    Period  Weeks    Status  New    Target Date  07/13/19        PT Long Term Goals - 06/29/19 1513      PT LONG TERM GOAL #1   Title  Patient will be able to obtain full knee extension range of motion to ambulate appropriately    Baseline  -20 degree knee extension    Time  6    Period  Weeks    Status  New    Target Date  08/24/19      PT LONG TERM GOAL #2   Title  Patient will have his worst knee pain reduced to 5/10 on NPRS in order to feel more comfortable walking around the house    Baseline  eval: 9/10    Time  6    Period  Weeks    Status  New    Target Date  08/10/19      PT LONG TERM GOAL #3   Title  Patient will show an ability to walk for 5 minutes without an increase in pain    Baseline  pain immediately    Time  6    Period  Weeks    Status  New    Target Date  08/10/19             Plan - 06/30/19 1320    Clinical Impression Statement  Patient is a pleasant 79 yo male that comes in s/s consistent with the CPR for cancer as evidenced by decreased appetite, unexplained weight loss, Hx of cancer (renal), and Age > 55yo. Patient's PCP was contacted in order to inform them of our findings today in the evaluation. Patient's limitations are as follows: AROM decreased in knee flexion and extension, 101 and 24 degrees respectively. Patient shows limitations w/ hip ROM evidenced by decreased  hip flexion in standing, more noticable on the LLE. Patient shows increased pain w/ palpation over medial tibiofemoral joint line. Patient demonstrates an increase in pain with posterior and anterior tibiofemoral joint mob as well as  with superior mob of patellofemoral joint, in improvement in sx. Patient also has pain with AROM of LB as evidenced by inability to move through full ROM in any direction. Due to patient's high irritability, patient was not fully assessed for strength through MMT but through functional tasks like STS, standing hip abduction, and stairs. Patient showed aberrant trunk movement w/ standing hip abductions possibly indicating weak hip abductors. Patient also showed inability to eccentrically control sitting down into chair, also indicating glute weakness. Patient would benefit from skilled therapy by improving knee joint ROM, hip strength and low back ROM to return patient to a more pain free functional capacity.    Personal Factors and Comorbidities  Age;Comorbidity 3+    Comorbidities  Diabetes, chronic pain, Hx CA    Examination-Activity Limitations  Bed Mobility;Locomotion Level;Transfers;Squat;Stairs;Sleep    Stability/Clinical Decision Making  Unstable/Unpredictable    Clinical Decision Making  High    Rehab Potential  Fair    PT Frequency  2x / week    PT Duration  8 weeks    PT Treatment/Interventions  ADLs/Self Care Home Management;Moist Heat;Traction;Biofeedback;Ultrasound;Cryotherapy;Electrical Stimulation;Gait training;Stair training;Functional mobility training;Therapeutic activities;Therapeutic exercise;Balance training;Neuromuscular re-education;Patient/family education;Manual techniques;Passive range of motion;Dry needling;Energy conservation;Taping;Spinal Manipulations;Joint Manipulations    PT Next Visit Plan  Address ROM impairments at the knee with STM and joint mobs    PT Home Exercise Plan  AROM on ball    Consulted and Agree with Plan of Care  Patient       Patient will benefit from skilled therapeutic intervention in order to improve the following deficits and impairments:  Abnormal gait, Decreased endurance, Decreased coordination, Decreased range of motion, Difficulty walking,  Decreased activity tolerance, Impaired UE functional use, Impaired vision/preception, Pain, Decreased balance, Hypomobility, Decreased mobility, Decreased strength  Visit Diagnosis: DDD (degenerative disc disease), lumbar  Chronic bilateral low back pain without sciatica  Primary osteoarthritis of left knee  Pain in left hip     Problem List Patient Active Problem List   Diagnosis Date Noted  . DJD (degenerative joint disease), lumbosacral 11/04/2017  . Personal history of renal cancer 10/17/2017  . Advanced care planning/counseling discussion 01/13/2017  . Arthritis 01/13/2017  . Senile purpura ([place]) 09/26/2016  . Elevated PSA 01/07/2015  . BPH with obstruction/lower urinary tract symptoms 01/07/2015  . DM due to underlying condition with diabetic nephropathy ([place])   . Hypertension associated with diabetes ([place])   . Hyperlipidemia associated with type 2 diabetes mellitus ([place])     Blythe Stanford, PT DPT 06/30/2019, 1:24 PM  [facility] [address] [place], [address] Phone: [phone]   Fax:  [phone]  Name: Jason Contreras MRN: [id number] Date of Birth: 09-11-1940

## 2019-06-30 NOTE — Telephone Encounter (Signed)
Would you call [name] and get him in to see me or Dr. Erlene Quan either tomorrow or Friday?  If not, I would like him seen next week.

## 2019-06-30 NOTE — Progress Notes (Signed)
Refer to [facility] PharmD note for changes.

## 2019-06-30 NOTE — Chronic Care Management (AMB) (Signed)
Chronic Care Management   Follow Up Note   06/30/2019 Name: CONSUELO BODKINS MRN: [id number] DOB: 1941/03/27  Referred by: Venita Lick, NP Reason for referral : Chronic Care Management (Medication Management)   ACXEL TUMMINIA is a 79 y.o. year old male who is a primary care patient of [name], [name], NP. The CCM team was consulted for assistance with chronic disease management and care coordination needs.    Contacted patient for medication management review.   Review of patient status, including review of consultants reports, relevant laboratory and other test results, and collaboration with appropriate care team members and the patient's provider was performed as part of comprehensive patient evaluation and provision of chronic care management services.    SDOH (Social Determinants of Health) assessments performed: No See Care Plan activities for detailed interventions related to [facility])     Outpatient Encounter Medications as of 06/30/2019  Medication Sig  . acetaminophen (TYLENOL) 500 MG tablet Take 1,000 mg by mouth every 6 (six) hours as needed.  [name] atorvastatin (LIPITOR) 20 MG tablet TAKE 1 TABLET BY MOUTH  DAILY AT 6 PM.  . carvedilol (COREG) 6.25 MG tablet TAKE 1 TABLET BY MOUTH TWO  TIMES DAILY  . Cholecalciferol 1.25 MG (50000 UT) TABS Take 1 tablet by mouth once a week. For 8 weeks and then stop.  Return to office for lab draw.  . DULoxetine (CYMBALTA) 60 MG capsule Take 1 capsule (60 mg total) by mouth daily.  [name] gabapentin (NEURONTIN) 600 MG tablet Take 1 tablet (600 mg total) by mouth 3 (three) times daily.  [name] glimepiride (AMARYL) 4 MG tablet TAKE ONE-HALF TABLET BY  MOUTH DAILY  . losartan (COZAAR) 100 MG tablet TAKE 1 TABLET BY MOUTH  DAILY (Patient taking differently: 50 mg. )  . metFORMIN (GLUCOPHAGE) 500 MG tablet TAKE 2 TABLETS BY MOUTH TWO TIMES DAILY  . predniSONE (DELTASONE) 10 MG tablet Taper - 5, 5, 4, 4, 3, 3, 2, 2, 1, 1, 1, 1  . tamsulosin (FLOMAX) 0.4 MG  CAPS capsule TAKE 1 CAPSULE BY MOUTH  DAILY  . vitamin B-12 (CYANOCOBALAMIN) 1000 MCG tablet Take 1,000 mcg by mouth daily.  [name] lidocaine (XYLOCAINE) 5 % ointment Apply 1 application topically 3 (three) times daily as needed. (Patient not taking: Reported on 06/30/2019)  . mometasone (ELOCON) 0.1 % cream APPLY  CREAM TOPICALLY ONCE DAILY   No facility-administered encounter medications on file as of 06/30/2019.     Objective:   Goals Addressed            This Visit's Progress     Patient Stated   . PharmD "His pain is terrible" (pt-stated)       Current Barriers:  . Polypharmacy; complex patient with multiple comorbidities including HTN, HLD, arthritis, T2DM o Reports he saw Rheumatology today, and was in lots of pain after that exam, the physical stress of going to the appointment, and getting lab work. Also had PT yesterday. Requested to reschedule appointment w/ PCP today o PT recommended he schedule sooner follow up with urology given his symptoms and hx renal cancer . Self-manages medications. Daughter, Bernerd Limbo, is very involved in his care . Per chart review, previous [facility] pharmacist assisted in application for Medicare Extra Help. Approved.  o Arthritis: reports significant knee, back, hip, elbows, finger joint pain. Saw Rheumatology today for lab work. Notes he is significantly debilitated from this full body pain. o Chronic pain: duloxetine 60 mg daily, gabapentin 600 mg TID,  requests a prescription for lidoderm 5% patches to [facility] to see if these are covered on his insurance. Notes the lidocaine cream does not provide the benefit the lidocaine patches used to  o T2DM: A1c 5.8%, Continues on metformin 1000 mg BID and glimepiride 2 mg daily. Notes not checking BG at home currently, but denies symptoms of hypoglycemia. Notes previous "blacking out" episodes on glimepiride 4 mg  o HTN: losartan 100 mg daily, carvedilol 6.25 mg BID; BP at goal at last office visit. Notes he  occasionally checks at home o ASCVD risk reduction: atorvastatin 20 mg daily; last LDL at goal <70 o Supplementation: vitamin B12 1000 mcg daily, Vitamin D2 50,000 units once weekly  Pharmacist Clinical Goal(s):  [name] Over the next 90 days, patient will work with PharmD and provider towards optimized medication management  Interventions: . Comprehensive medication review performed, medication list updated in electronic medical record . Reviewed safe management of diabetes. Given A1c <6%, recommend d/c glimepiride, as it is likely not needed, to reduce polypharmacy and risk of hypoglycemia. Will collaborate w/ PCP on this.  . Will collaborate for lidocaine patch 5% prescription . Discussed w/ PCP and office staff. Patient appointment rescheduled.  . Passed along recommendation to see urology sooner than current scheduled follow up in June given symptoms. Will also route my note to Zara Council  Patient Self Care Activities:  . Patient will take medications as prescribed  Please see past updates related to this goal by clicking on the "Past Updates" button in the selected goal          Plan:  - Scheduled f/u call 08/18/19  Catie Darnelle Maffucci, PharmD, [place] [phone]

## 2019-06-30 NOTE — Patient Instructions (Signed)
Visit Information  Goals Addressed            This Visit's Progress     Patient Stated   . PharmD "His pain is terrible" (pt-stated)       Current Barriers:  . Polypharmacy; complex patient with multiple comorbidities including HTN, HLD, arthritis, T2DM o Reports he saw Rheumatology today, and was in lots of pain after that exam, the physical stress of going to the appointment, and getting lab work. Also had PT yesterday. Requested to reschedule appointment w/ PCP today o PT recommended he schedule sooner follow up with urology given his symptoms and hx renal cancer . Self-manages medications. Daughter, Bernerd Limbo, is very involved in his care . Per chart review, previous [facility] pharmacist assisted in application for Medicare Extra Help. Approved.  o Arthritis: reports significant knee, back, hip, elbows, finger joint pain. Saw Rheumatology today for lab work. Notes he is significantly debilitated from this full body pain. o Chronic pain: duloxetine 60 mg daily, gabapentin 600 mg TID, requests a prescription for lidoderm 5% patches to [facility] to see if these are covered on his insurance. Notes the lidocaine cream does not provide the benefit the lidocaine patches used to  o T2DM: A1c 5.8%, Continues on metformin 1000 mg BID and glimepiride 2 mg daily. Notes not checking BG at home currently, but denies symptoms of hypoglycemia. Notes previous "blacking out" episodes on glimepiride 4 mg  o HTN: losartan 100 mg daily, carvedilol 6.25 mg BID; BP at goal at last office visit. Notes he occasionally checks at home o ASCVD risk reduction: atorvastatin 20 mg daily; last LDL at goal <70 o Supplementation: vitamin B12 1000 mcg daily, Vitamin D2 50,000 units once weekly  Pharmacist Clinical Goal(s):  [name] Over the next 90 days, patient will work with PharmD and provider towards optimized medication management  Interventions: . Comprehensive medication review performed, medication list updated in electronic  medical record . Reviewed safe management of diabetes. Given A1c <6%, recommend d/c glimepiride, as it is likely not needed, to reduce polypharmacy and risk of hypoglycemia. Will collaborate w/ PCP on this.  . Will collaborate for lidocaine patch 5% prescription . Discussed w/ PCP and office staff. Patient appointment rescheduled.  . Passed along recommendation to see urology sooner than current scheduled follow up in June given symptoms. Will also route my note to Zara Council  Patient Self Care Activities:  . Patient will take medications as prescribed  Please see past updates related to this goal by clicking on the "Past Updates" button in the selected goal         Patient verbalizes understanding of instructions provided today.   Plan:  - Scheduled f/u call 08/18/19  Catie Darnelle Maffucci, PharmD, [place] [phone]

## 2019-07-06 ENCOUNTER — Other Ambulatory Visit: Payer: Self-pay

## 2019-07-06 ENCOUNTER — Encounter: Payer: Self-pay | Admitting: Urology

## 2019-07-06 ENCOUNTER — Ambulatory Visit: Payer: Medicare Other | Admitting: Urology

## 2019-07-06 ENCOUNTER — Telehealth: Payer: Medicare Other

## 2019-07-06 VITALS — BP 97/63 | HR 109 | Ht 68.31 in | Wt 208.0 lb

## 2019-07-06 DIAGNOSIS — R972 Elevated prostate specific antigen [PSA]: Secondary | ICD-10-CM | POA: Diagnosis not present

## 2019-07-06 DIAGNOSIS — Z85528 Personal history of other malignant neoplasm of kidney: Secondary | ICD-10-CM

## 2019-07-06 NOTE — Progress Notes (Signed)
07/06/2019 10:48 AM   [name] [birth year] [id number]  Referring provider: Venita Lick, NP [address] [place],  [place] [address]   HPI: Jason Contreras is a 79 yo M with a history of RCC of the right kidney and right sided pain who returns today f/u prior to scheduled visit. Appt today was scheduled at request of clinical pharmacist who is concerned about 15 Ib weight loss over last 3 months and history malignancy.   He has a personal history of RCC of the right kidney (robotic partial nephrectomy on 12/12/2015 and cryoablation of a right renal lesion on 10/17/2017), elevated PSA and BPH with LU TS.  CT of the abdomen w & wo on 10/23/2018 noted no findings of recurrence along the right posterior mid kidney cryoablation site, or along the right anterior mid kidney partial nephrectomy site. No adenopathy or other worrisome findings.   Other imaging findings of potential clinical significance: Aortic Atherosclerosis (ICD10-I70.0). Coronary atherosclerosis. Colonic diverticulosis. Multilevel lower lumbar impingement.   No flank pain or gross hematuria.    He was diagnosed with advanced arthritis by rheumatology. He reports of being in a lot of pain so he went to an orthopedic surgeon where he was given a shot in his knee. He was put on Prednisone by Dr. Ottie Glazier and is feeling well now.   He had a DRE done on 6/20 by Carlis Stable which was unremarkable.  Prostate biopsy in 2017 was negative with TRUS volume of 41. Most recent PSA 10.3,on 05/28/19, rising.  He reports of no urinary symptoms and is doing well on Flomax.   PMH: Past Medical History:  Diagnosis Date  . Adenocarcinoma, renal cell ([place])   . Arthritis   . Diabetes mellitus without complication ([place])   . Elevated PSA   . Headache   . Hematuria   . Hyperlipidemia   . Hypertension   . Neuropathy   . Obesity   . PONV (postoperative nausea and vomiting)   . Renal insufficiency   . Right renal mass     Surgical History:  Past Surgical History:  Procedure Laterality Date  . APPENDECTOMY    . CRYOABLATION  10/17/2017  . IR RADIOLOGIST EVAL & MGMT  07/17/2017  . IR RADIOLOGIST EVAL & MGMT  08/13/2017  . IR RADIOLOGIST EVAL & MGMT  11/12/2017  . IR RADIOLOGIST EVAL & MGMT  03/05/2018  . RADIOLOGY WITH ANESTHESIA Right 10/17/2017   Procedure: CT WITH ANESTHESIA RENAL CRYOABLATION;  Surgeon: [name], MD;  Location: [facility];  Service: Anesthesiology;  Laterality: Right;  . ROBOTIC ASSITED PARTIAL NEPHRECTOMY Right 12/12/2015   Procedure: ROBOTIC ASSITED PARTIAL NEPHRECTOMY;  Surgeon: [name], MD;  Location: [facility] ORS;  Service: Urology;  Laterality: Right;    Home Medications:  Allergies as of 07/06/2019      Reactions   Morphine Nausea And Vomiting   Morphine And Related Nausea And Vomiting      Medication List       Accurate as of July 06, 2019 11:59 PM. If you have any questions, ask your nurse or doctor.        acetaminophen 500 MG tablet Commonly known as: TYLENOL Take 1,000 mg by mouth every 6 (six) hours as needed.   atorvastatin 20 MG tablet Commonly known as: LIPITOR TAKE 1 TABLET BY MOUTH  DAILY AT 6 PM.   carvedilol 6.25 MG tablet Commonly known as: COREG TAKE 1 TABLET BY MOUTH TWO  TIMES DAILY  Cholecalciferol 1.25 MG (50000 UT) Tabs Take 1 tablet by mouth once a week. For 8 weeks and then stop.  Return to office for lab draw.   DULoxetine 60 MG capsule Commonly known as: CYMBALTA Take 1 capsule (60 mg total) by mouth daily.   gabapentin 600 MG tablet Commonly known as: NEURONTIN Take 1 tablet (600 mg total) by mouth 3 (three) times daily.   lidocaine 5 % ointment Commonly known as: XYLOCAINE Apply 1 application topically 3 (three) times daily as needed.   lidocaine 5 % Commonly known as: LIDODERM Place 1 patch onto the skin daily. Remove & Discard patch within 12 hours or as directed by MD   losartan 100 MG tablet Commonly known as: COZAAR TAKE 1 TABLET BY  MOUTH  DAILY What changed:   how much to take  how to take this  when to take this   metFORMIN 500 MG tablet Commonly known as: GLUCOPHAGE TAKE 2 TABLETS BY MOUTH TWO TIMES DAILY   mometasone 0.1 % cream Commonly known as: ELOCON APPLY  CREAM TOPICALLY ONCE DAILY   predniSONE 10 MG tablet Commonly known as: DELTASONE Taper - 5, 5, 4, 4, 3, 3, 2, 2, 1, 1, 1, 1   tamsulosin 0.4 MG Caps capsule Commonly known as: FLOMAX TAKE 1 CAPSULE BY MOUTH  DAILY   vitamin B-12 1000 MCG tablet Commonly known as: CYANOCOBALAMIN Take 1,000 mcg by mouth daily.       Allergies:  Allergies  Allergen Reactions  . Morphine Nausea And Vomiting  . Morphine And Related Nausea And Vomiting    Family History: Family History  Problem Relation Age of Onset  . Bone cancer Mother   . Cancer Mother   . Cancer Father   . Stroke Brother   . Kidney disease Neg Hx     Social History:  reports that he has never smoked. He has never used smokeless tobacco. He reports that he does not drink alcohol or use drugs.   Physical Exam: BP 97/63   Pulse (!) 109   Ht 5' 8.31" (1.735 m)   Wt 208 lb (94.3 kg)   BMI 31.34 kg/m   Constitutional:  Alert and oriented, No acute distress. HEENT: [place] AT, moist mucus membranes.  Trachea midline, no masses. Cardiovascular: No clubbing, cyanosis, or edema. Respiratory: Normal respiratory effort, no increased work of breathing. Skin: No rashes, bruises or suspicious lesions. Neurologic: Grossly intact, no focal deficits, moving all 4 extremities. Psychiatric: Normal mood and affect.  Laboratory Data: Lab Results  Component Value Date   WBC 5.1 05/28/2019   HGB 12.8 (L) 05/28/2019   HCT 38.7 05/28/2019   MCV 97 05/28/2019   PLT 308 05/28/2019    Lab Results  Component Value Date   CREATININE 0.70 (L) 05/28/2019    Lab Results  Component Value Date   HGBA1C 5.8 05/28/2019    Assessment & Plan:    1. History of Renal Cell Carcinoma Repeat his  cross sectional imaging especially in lite of recent weight loss  CT Abd/pelvis and Chest X-ray ordered   2. Weight loss  See Above, suspect related to recent pain from arthritis  3. Elevated PSA  10.3 most recent PSA 05/28/19 Repeat PSA today and if elevated will recommend Prostate MRI  6/20 rectal exam, unremarkable PSA fluctuated in past high as 6.8   4. BPH with LUTS No bothersome urinary symptoms Currently on flomax, working well  Advised pt not to double up on flomax due  to increased side effect risk   Will f/u with Carlis Stable in 1 month   [facility] [address] [place], [place] [address] [phone]  I, Lucas Mallow, am acting as a scribe for Dr. [name],  I have reviewed the above documentation for accuracy and completeness, and I agree with the above.   [name], MD

## 2019-07-07 ENCOUNTER — Ambulatory Visit: Payer: Medicare Other | Attending: Nurse Practitioner

## 2019-07-07 ENCOUNTER — Other Ambulatory Visit: Payer: Self-pay

## 2019-07-07 ENCOUNTER — Telehealth: Payer: Self-pay | Admitting: Urology

## 2019-07-07 DIAGNOSIS — M25552 Pain in left hip: Secondary | ICD-10-CM | POA: Diagnosis not present

## 2019-07-07 DIAGNOSIS — M5136 Other intervertebral disc degeneration, lumbar region: Secondary | ICD-10-CM | POA: Diagnosis not present

## 2019-07-07 DIAGNOSIS — R972 Elevated prostate specific antigen [PSA]: Secondary | ICD-10-CM

## 2019-07-07 DIAGNOSIS — M545 Low back pain: Secondary | ICD-10-CM | POA: Insufficient documentation

## 2019-07-07 DIAGNOSIS — M25562 Pain in left knee: Secondary | ICD-10-CM | POA: Diagnosis not present

## 2019-07-07 DIAGNOSIS — G8929 Other chronic pain: Secondary | ICD-10-CM | POA: Insufficient documentation

## 2019-07-07 LAB — PSA: Prostate Specific Ag, Serum: 13.3 ng/mL — ABNORMAL HIGH (ref 0.0–4.0)

## 2019-07-07 NOTE — Telephone Encounter (Signed)
PSA continues to rise relatively quickly, now up to 13.  As per discussion, lets pursue prostate MRI and then decide if we need to biopsy any specific lesion.  Order was placed today.  Radiology should be reaching out to you for both your CT abdomen pelvis as well as this MRI.  [name], MD

## 2019-07-07 NOTE — Telephone Encounter (Signed)
Patient informed, verbalized understanding.

## 2019-07-07 NOTE — Therapy (Signed)
[facility] [address] [place], [address] Phone: [phone]   Fax:  [phone]  Physical Therapy Treatment  Patient Details  Name: Jason Contreras MRN: [id number] Date of Birth: May 08, 1940 No data recorded  Encounter Date: 07/07/2019  PT End of Session - 07/07/19 1611    Visit Number  2    Number of Visits  13    Date for PT Re-Evaluation  08/24/19    Authorization Type  2/10    PT Start Time  1515    PT Stop Time  1600    PT Time Calculation (min)  45 min    Equipment Utilized During Treatment  Gait belt    Activity Tolerance  Patient limited by pain    Behavior During Therapy  [facility] for tasks assessed/performed       Past Medical History:  Diagnosis Date  . Adenocarcinoma, renal cell ([place])   . Arthritis   . Diabetes mellitus without complication ([place])   . Elevated PSA   . Headache   . Hematuria   . Hyperlipidemia   . Hypertension   . Neuropathy   . Obesity   . PONV (postoperative nausea and vomiting)   . Renal insufficiency   . Right renal mass     Past Surgical History:  Procedure Laterality Date  . APPENDECTOMY    . CRYOABLATION  10/17/2017  . IR RADIOLOGIST EVAL & MGMT  07/17/2017  . IR RADIOLOGIST EVAL & MGMT  08/13/2017  . IR RADIOLOGIST EVAL & MGMT  11/12/2017  . IR RADIOLOGIST EVAL & MGMT  03/05/2018  . RADIOLOGY WITH ANESTHESIA Right 10/17/2017   Procedure: CT WITH ANESTHESIA RENAL CRYOABLATION;  Surgeon: [name], MD;  Location: [facility];  Service: Anesthesiology;  Laterality: Right;  . ROBOTIC ASSITED PARTIAL NEPHRECTOMY Right 12/12/2015   Procedure: ROBOTIC ASSITED PARTIAL NEPHRECTOMY;  Surgeon: [name], MD;  Location: [facility] OR[facility];  Service: Urology;  Laterality: Right;    There were no vitals filed for this visit.  Subjective Assessment - 07/07/19 1527    Subjective  Patient reports improvement in pain and symptoms after starting prednisone. Patient states no major  changes otherwise.    Pertinent History  Hx of renal cancer, Family Hx of bone cancer, sudden unexplained weight loss, decreased appetite, Decreased saddle sensation, and increasing spread of pain throughout body    Limitations  Lifting;Standing    How long can you stand comfortably?  5 minutes    How long can you walk comfortably?  can't walk w/o pain    Patient Stated Goals  get back to walking pain free    Currently in Pain?  Yes    Pain Score  3     Pain Location  Knee    Pain Orientation  Left    Pain Descriptors / Indicators  Aching    Pain Type  Chronic pain    Pain Onset  More than a month ago    Pain Frequency  Intermittent    Pain Onset  More than a month ago       TREATMENT  Therapeutic Exercise Ball rolls out with foot on ball -- 3 x 30sec Leg Press -- x 15 45#, x15 65#, x15 75# to improve leg strength Hip abduction in standing -- stopped secondary to knee pain Hip extension in standing -- stopped secondary to knee pain Sit to stands without use of UEs -- x 20 Knee extension with GTB --  x 20  Knee flexion with GTB -- x 20  SLR in sitting -- x 15 on the L side Straight arm lumbar extension with UE support -- x 20   Performed exercises to improve pain and spasms and decrease pain overall within the knee/back  PT Education - 07/07/19 1538    Education Details  form/technique with exercise; Added knee extension, flexion, sit to stands    Person(s) Educated  Patient    Methods  Explanation;Demonstration    Comprehension  Verbalized understanding;Returned demonstration       PT Short Term Goals - 06/29/19 1512      PT SHORT TERM GOAL #1   Title  Patient will be independent in HEP in order to reduce number of visits    Baseline  --    Time  2    Period  Weeks    Status  New    Target Date  07/13/19        PT Long Term Goals - 06/29/19 1513      PT LONG TERM GOAL #1   Title  Patient will be able to obtain full knee extension range of motion to ambulate  appropriately    Baseline  -20 degree knee extension    Time  6    Period  Weeks    Status  New    Target Date  08/24/19      PT LONG TERM GOAL #2   Title  Patient will have his worst knee pain reduced to 5/10 on NPRS in order to feel more comfortable walking around the house    Baseline  eval: 9/10    Time  6    Period  Weeks    Status  New    Target Date  08/10/19      PT LONG TERM GOAL #3   Title  Patient will show an ability to walk for 5 minutes without an increase in pain    Baseline  pain immediately    Time  6    Period  Weeks    Status  New    Target Date  08/10/19            Plan - 07/07/19 1612    Clinical Impression Statement  Patient demonstrates improvement in pain and symptoms today most likely secondary to use prednisone. Focused on improving limitations most specifically knee quadrcieps strength and hip strength. Patient did have an increase in knee pain with SLS on the affected side, which is improved after coming out of the affected position. Patient is improving, however, continues to have increased limitations in terms of hip strength and stabilization. Patient will benefit from further skilled therapy focused on improving limitations to return to prior level of function.    Personal Factors and Comorbidities  Age;Comorbidity 3+    Comorbidities  Diabetes, chronic pain, Hx CA    Examination-Activity Limitations  Bed Mobility;Locomotion Level;Transfers;Squat;Stairs;Sleep    Stability/Clinical Decision Making  Unstable/Unpredictable    Rehab Potential  Fair    PT Frequency  2x / week    PT Duration  8 weeks    PT Treatment/Interventions  ADLs/Self Care Home Management;Moist Heat;Traction;Biofeedback;Ultrasound;Cryotherapy;Electrical Stimulation;Gait training;Stair training;Functional mobility training;Therapeutic activities;Therapeutic exercise;Balance training;Neuromuscular re-education;Patient/family education;Manual techniques;Passive range of motion;Dry  needling;Energy conservation;Taping;Spinal Manipulations;Joint Manipulations    PT Next Visit Plan  Address ROM impairments at the knee with STM and joint mobs    PT Home Exercise Plan  AROM on ball    Consulted and  Agree with Plan of Care  Patient       Patient will benefit from skilled therapeutic intervention in order to improve the following deficits and impairments:  Abnormal gait, Decreased endurance, Decreased coordination, Decreased range of motion, Difficulty walking, Decreased activity tolerance, Impaired UE functional use, Impaired vision/preception, Pain, Decreased balance, Hypomobility, Decreased mobility, Decreased strength  Visit Diagnosis: DDD (degenerative disc disease), lumbar  Chronic bilateral low back pain without sciatica     Problem List Patient Active Problem List   Diagnosis Date Noted  . DJD (degenerative joint disease), lumbosacral 11/04/2017  . Personal history of renal cancer 10/17/2017  . Advanced care planning/counseling discussion 01/13/2017  . Arthritis 01/13/2017  . Senile purpura ([place]) 09/26/2016  . Elevated PSA 01/07/2015  . BPH with obstruction/lower urinary tract symptoms 01/07/2015  . DM due to underlying condition with diabetic nephropathy ([place])   . Hypertension associated with diabetes ([place])   . Hyperlipidemia associated with type 2 diabetes mellitus ([place])     Blythe Stanford, PT DPT 07/07/2019, 4:16 PM  [facility] [address] [place], [address] Phone: [phone]   Fax:  [phone]  Name: KHALEEL FINGERHUT MRN: [id number] Date of Birth: 05-Dec-1940

## 2019-07-09 ENCOUNTER — Telehealth: Payer: Medicare Other

## 2019-07-12 ENCOUNTER — Other Ambulatory Visit: Payer: Self-pay

## 2019-07-12 ENCOUNTER — Encounter: Payer: Self-pay | Admitting: Nurse Practitioner

## 2019-07-12 ENCOUNTER — Ambulatory Visit (INDEPENDENT_AMBULATORY_CARE_PROVIDER_SITE_OTHER): Payer: Medicare Other | Admitting: Nurse Practitioner

## 2019-07-12 VITALS — BP 95/64 | HR 81 | Temp 98.6°F | Wt 208.0 lb

## 2019-07-12 DIAGNOSIS — D649 Anemia, unspecified: Secondary | ICD-10-CM | POA: Diagnosis not present

## 2019-07-12 DIAGNOSIS — I951 Orthostatic hypotension: Secondary | ICD-10-CM

## 2019-07-12 DIAGNOSIS — R972 Elevated prostate specific antigen [PSA]: Secondary | ICD-10-CM | POA: Diagnosis not present

## 2019-07-12 DIAGNOSIS — E0821 Diabetes mellitus due to underlying condition with diabetic nephropathy: Secondary | ICD-10-CM

## 2019-07-12 DIAGNOSIS — E538 Deficiency of other specified B group vitamins: Secondary | ICD-10-CM

## 2019-07-12 DIAGNOSIS — E559 Vitamin D deficiency, unspecified: Secondary | ICD-10-CM | POA: Diagnosis not present

## 2019-07-12 NOTE — Assessment & Plan Note (Addendum)
Continue collaboration with urology.  Recent note reviewed. 

## 2019-07-12 NOTE — Assessment & Plan Note (Signed)
Continue daily supplement and recheck level today. 

## 2019-07-12 NOTE — Progress Notes (Signed)
BP 95/64 (BP Location: Left Arm, Patient Position: Standing)   Pulse 81   Temp 98.6 F (37 C) (Oral)   Wt 208 lb (94.3 kg)   SpO2 100%   BMI 31.34 kg/m    Subjective:    Patient ID: Jason Contreras, male    DOB: 1940/10/18, 79 y.o.   MRN: [id number]  HPI: Jason Contreras is a 79 y.o. male  Chief Complaint  Patient presents with  . Diabetes    4 week f/up- no recent eye exam per patient   DIABETES Discontinued Actos at last visit and then Glimepiride.   Currently only on Metformin 1000 MG BID.  Is currently on Prednisone by rheumatology for his ongoing chronic arthritis pain + is performing PT.  Reports some dizzy episodes lately, where his BP is dropping slightly when he stands up.  Taking Losartan 50 MG daily and Carvedilol 6.25 MG BID.  Is not checking sugar during these episodes, but reports if he stands up too quick his BP will be 90 over something.  Has lost some weight over past several months due to arthritis pain, but reports with Prednisone on board he has felt better and is moving faster.  Had some anemia noted on recent labs with B12 106 and H/H 12.8/38.7, MCV 97. Hypoglycemic episodes:no Polydipsia/polyuria: no Visual disturbance: no Chest pain: no Paresthesias: no Glucose Monitoring: yes  Accucheck frequency: Daily  Fasting glucose: 132 yesterday morning -- have been ranging around 130 in morning  Post prandial:  Evening:  Before meals: Taking Insulin?: no  Long acting insulin:  Short acting insulin: Blood Pressure Monitoring: daily Retinal Examination: Not up to Date Foot Exam: Up to Date Pneumovax: Not up to Date Influenza: Up to Date Aspirin: no   ELEVATED PSA: Being followed by urology.  Last PSA in office 10.3 and with urology on 07/06/19 was 13.3. Next week he goes for CT scan and MRI of prostate in couple weeks.  Has history of kidney cancer to right side, had two operations on this.    VITAMIN D DEFICIENCY: Level was 10.1 on recent labs.  Started on  supplement.  No recent falls or fractures.  Relevant past medical, surgical, family and social history reviewed and updated as indicated. Interim medical history since our last visit reviewed. Allergies and medications reviewed and updated.  Review of Systems  Constitutional: Negative for activity change, diaphoresis, fatigue and fever.  Respiratory: Negative for cough, chest tightness, shortness of breath and wheezing.   Cardiovascular: Negative for chest pain, palpitations and leg swelling.  Gastrointestinal: Negative.   Endocrine: Negative for polydipsia, polyphagia and polyuria.  Musculoskeletal: Positive for arthralgias.  Neurological: Positive for dizziness. Negative for syncope, weakness and numbness.  Psychiatric/Behavioral: Negative.     Per HPI unless specifically indicated above     Objective:    BP 95/64 (BP Location: Left Arm, Patient Position: Standing)   Pulse 81   Temp 98.6 F (37 C) (Oral)   Wt 208 lb (94.3 kg)   SpO2 100%   BMI 31.34 kg/m   Wt Readings from Last 3 Encounters:  07/12/19 208 lb (94.3 kg)  07/06/19 208 lb (94.3 kg)  05/28/19 211 lb 12.8 oz (96.1 kg)    SIT BP --- 123/78 and HR 81 STAND BP -- 95/64 and HR 81  Physical Exam Vitals and nursing note reviewed.  Constitutional:      General: He is awake.     Appearance: He is well-developed and overweight.  HENT:     Head: Normocephalic and atraumatic.     Right Ear: Hearing normal. No drainage.     Left Ear: Hearing normal. No drainage.  Eyes:     General: Lids are normal.        Right eye: No discharge.        Left eye: No discharge.     Conjunctiva/sclera: Conjunctivae normal.     Pupils: Pupils are equal, round, and reactive to light.  Neck:     Thyroid: No thyromegaly.     Vascular: No carotid bruit or JVD.     Trachea: Trachea normal.  Cardiovascular:     Rate and Rhythm: Normal rate and regular rhythm.     Heart sounds: Normal heart sounds, S1 normal and S2 normal. No murmur.  No gallop.   Pulmonary:     Effort: Pulmonary effort is normal. No accessory muscle usage or respiratory distress.     Breath sounds: Normal breath sounds.  Abdominal:     General: Bowel sounds are normal.     Palpations: Abdomen is soft.  Musculoskeletal:        General: Normal range of motion.     Cervical back: Normal range of motion and neck supple.     Right lower leg: No edema.     Left lower leg: No edema.  Skin:    General: Skin is warm and dry.     Capillary Refill: Capillary refill takes less than 2 seconds.  Neurological:     Mental Status: He is alert and oriented to person, place, and time.     Cranial Nerves: Cranial nerves are intact.     Coordination: Coordination is intact.     Gait: Gait is intact.     Deep Tendon Reflexes: Reflexes are normal and symmetric.     Reflex Scores:      Brachioradialis reflexes are 2+ on the right side and 2+ on the left side.      Patellar reflexes are 2+ on the right side and 2+ on the left side. Psychiatric:        Mood and Affect: Mood normal.        Behavior: Behavior normal. Behavior is cooperative.        Thought Content: Thought content normal.        Judgment: Judgment normal.     Results for orders placed or performed in visit on 07/06/19  PSA  Result Value Ref Range   Prostate Specific Ag, Serum 13.3 (H) 0.0 - 4.0 ng/mL      Assessment & Plan:   Problem List Items Addressed This Visit      Cardiovascular and Mediastinum   Orthostatic hypotension    Noted recently, has had recent weight loss over past several months due to his chronic pain.  BP today with decrease from 123/78 to 95/64 with standing.  Will discontinue Losartan at this time and recommend increased water intake at home and wearing compression hose daily, on AM and off HS.  Consider reduction to discontinuation of Carvedilol if ongoing low readings with position changes.  In future if stabilizes may consider initiation of very low dose ACE for kidney  protection.  Return to office in 4 weeks.        Endocrine   DM due to underlying condition with diabetic nephropathy ([place]) - Primary    Chronic, ongoing in patient with history of renal cell CA.  A1C recently 5.8% and discontinued two medications.  BS remain in goal range with discontinuation.  Monitor BS daily in morning at home.  Continue Metformin, consider use of alternate options such as GLP if elevations present, due his age and risk for hypoglycemia.  Return to office in 4 weeks.      Relevant Orders   Basic metabolic panel     Other   Elevated PSA    Continue collaboration with urology.  Recent note reviewed.      Vitamin D deficiency    Continue daily supplement and recheck level today.      Relevant Orders   VITAMIN D 25 Hydroxy (Vit-D Deficiency, Fractures)   B12 deficiency    Noted on recent labs, 106, continue daily supplement and recheck level today.  Consider injections if minimal increase.      Relevant Orders   B12   Low hemoglobin    Noted mild decrease on recent labs, with recent dizziness episodes will recheck today and check iron, ferritin, TIBC.      Relevant Orders   CBC with Differential/Platelet   Iron, TIBC and Ferritin Panel       Follow up plan: Return in about 4 weeks (around 08/09/2019) for Orthostatic hypotension.

## 2019-07-12 NOTE — Assessment & Plan Note (Addendum)
Noted recently, has had recent weight loss over past several months due to his chronic pain.  BP today with decrease from 123/78 to 95/64 with standing.  Will discontinue Losartan at this time and recommend increased water intake at home and wearing compression hose daily, on AM and off HS.  Consider reduction to discontinuation of Carvedilol if ongoing low readings with position changes.  In future if stabilizes may consider initiation of very low dose ACE for kidney protection.  Return to office in 4 weeks.

## 2019-07-12 NOTE — Assessment & Plan Note (Signed)
Noted mild decrease on recent labs, with recent dizziness episodes will recheck today and check iron, ferritin, TIBC.

## 2019-07-12 NOTE — Patient Instructions (Addendum)
STOP LOSARTAN FOR NOW DUE TO ORTHOSTATIC HYPOTENSION.  TAKE VITAMIN D3 1000 units daily   Orthostatic Hypotension Blood pressure is a measurement of how strongly, or weakly, your blood is pressing against the walls of your arteries. Orthostatic hypotension is a sudden drop in blood pressure that happens when you quickly change positions, such as when you get up from sitting or lying down. Arteries are blood vessels that carry blood from your heart throughout your body. When blood pressure is too low, you may not get enough blood to your brain or to the rest of your organs. This can cause weakness, light-headedness, rapid heartbeat, and fainting. This can last for just a few seconds or for up to a few minutes. Orthostatic hypotension is usually not a serious problem. However, if it happens frequently or gets worse, it may be a sign of something more serious. What are the causes? This condition may be caused by:  Sudden changes in posture, such as standing up quickly after you have been sitting or lying down.  Blood loss.  Loss of body fluids (dehydration).  Heart problems.  Hormone (endocrine) problems.  Pregnancy.  Severe infection.  Lack of certain nutrients.  Severe allergic reactions (anaphylaxis).  Certain medicines, such as blood pressure medicine or medicines that make the body lose excess fluids (diuretics). Sometimes, this condition can be caused by not taking medicine as directed, such as taking too much of a certain medicine. What increases the risk? The following factors may make you more likely to develop this condition:  Age. Risk increases as you get older.  Conditions that affect the heart or the central nervous system.  Taking certain medicines, such as blood pressure medicine or diuretics.  Being pregnant. What are the signs or symptoms? Symptoms of this condition may include:  Weakness.  Light-headedness.  Dizziness.  Blurred  vision.  Fatigue.  Rapid heartbeat.  Fainting, in severe cases. How is this diagnosed? This condition is diagnosed based on:  Your medical history.  Your symptoms.  Your blood pressure measurement. Your health care provider will check your blood pressure when you are: ? Lying down. ? Sitting. ? Standing. A blood pressure reading is recorded as two numbers, such as "120 over 80" (or 120/80). The first ("top") number is called the systolic pressure. It is a measure of the pressure in your arteries as your heart beats. The second ("bottom") number is called the diastolic pressure. It is a measure of the pressure in your arteries when your heart relaxes between beats. Blood pressure is measured in a unit called mm Hg. Healthy blood pressure for most adults is 120/80. If your blood pressure is below 90/60, you may be diagnosed with hypotension. Other information or tests that may be used to diagnose orthostatic hypotension include:  Your other vital signs, such as your heart rate and temperature.  Blood tests.  Tilt table test. For this test, you will be safely secured to a table that moves you from a lying position to an upright position. Your heart rhythm and blood pressure will be monitored during the test. How is this treated? This condition may be treated by:  Changing your diet. This may involve eating more salt (sodium) or drinking more water.  Taking medicines to raise your blood pressure.  Changing the dosage of certain medicines you are taking that might be lowering your blood pressure.  Wearing compression stockings. These stockings help to prevent blood clots and reduce swelling in your legs. In some  cases, you may need to go to the hospital for:  Fluid replacement. This means you will receive fluids through an IV.  Blood replacement. This means you will receive donated blood through an IV (transfusion).  Treating an infection or heart problems, if this  applies.  Monitoring. You may need to be monitored while medicines that you are taking wear off. Follow these instructions at home: Eating and drinking   Drink enough fluid to keep your urine pale yellow.  Eat a healthy diet, and follow instructions from your health care provider about eating or drinking restrictions. A healthy diet includes: ? Fresh fruits and vegetables. ? Whole grains. ? Lean meats. ? Low-fat dairy products.  Eat extra salt only as directed. Do not add extra salt to your diet unless your health care provider told you to do that.  Eat frequent, small meals.  Avoid standing up suddenly after eating. Medicines  Take over-the-counter and prescription medicines only as told by your health care provider. ? Follow instructions from your health care provider about changing the dosage of your current medicines, if this applies. ? Do not stop or adjust any of your medicines on your own. General instructions   Wear compression stockings as told by your health care provider.  Get up slowly from lying down or sitting positions. This gives your blood pressure a chance to adjust.  Avoid hot showers and excessive heat as directed by your health care provider.  Return to your normal activities as told by your health care provider. Ask your health care provider what activities are safe for you.  Do not use any products that contain nicotine or tobacco, such as cigarettes, e-cigarettes, and chewing tobacco. If you need help quitting, ask your health care provider.  Keep all follow-up visits as told by your health care provider. This is important. Contact a health care provider if you:  Vomit.  Have diarrhea.  Have a fever for more than 2-3 days.  Feel more thirsty than usual.  Feel weak and tired. Get help right away if you:  Have chest pain.  Have a fast or irregular heartbeat.  Develop numbness in any part of your body.  Cannot move your arms or your  legs.  Have trouble speaking.  Become sweaty or feel light-headed.  Faint.  Feel short of breath.  Have trouble staying awake.  Feel confused. Summary  Orthostatic hypotension is a sudden drop in blood pressure that happens when you quickly change positions.  Orthostatic hypotension is usually not a serious problem.  It is diagnosed by having your blood pressure taken lying down, sitting, and then standing.  It may be treated by changing your diet or adjusting your medicines. This information is not intended to replace advice given to you by your health care provider. Make sure you discuss any questions you have with your health care provider. Document Revised: 10/16/2017 Document Reviewed: 10/16/2017 Elsevier Patient Education  [employer].

## 2019-07-12 NOTE — Assessment & Plan Note (Signed)
Chronic, ongoing in patient with history of renal cell CA.  A1C recently 5.8% and discontinued two medications.  BS remain in goal range with discontinuation.  Monitor BS daily in morning at home.  Continue Metformin, consider use of alternate options such as GLP if elevations present, due his age and risk for hypoglycemia.  Return to office in 4 weeks.

## 2019-07-12 NOTE — Assessment & Plan Note (Signed)
Noted on recent labs, 106, continue daily supplement and recheck level today.  Consider injections if minimal increase.

## 2019-07-13 ENCOUNTER — Other Ambulatory Visit: Payer: Self-pay | Admitting: Radiology

## 2019-07-13 ENCOUNTER — Ambulatory Visit: Payer: Medicare Other

## 2019-07-13 DIAGNOSIS — M545 Low back pain, unspecified: Secondary | ICD-10-CM

## 2019-07-13 DIAGNOSIS — Z85528 Personal history of other malignant neoplasm of kidney: Secondary | ICD-10-CM

## 2019-07-13 DIAGNOSIS — M25552 Pain in left hip: Secondary | ICD-10-CM | POA: Diagnosis not present

## 2019-07-13 DIAGNOSIS — G8929 Other chronic pain: Secondary | ICD-10-CM

## 2019-07-13 DIAGNOSIS — M5136 Other intervertebral disc degeneration, lumbar region: Secondary | ICD-10-CM | POA: Diagnosis not present

## 2019-07-13 DIAGNOSIS — R634 Abnormal weight loss: Secondary | ICD-10-CM

## 2019-07-13 DIAGNOSIS — M25562 Pain in left knee: Secondary | ICD-10-CM | POA: Diagnosis not present

## 2019-07-13 LAB — VITAMIN B12: Vitamin B-12: 800 pg/mL (ref 232–1245)

## 2019-07-13 LAB — CBC WITH DIFFERENTIAL/PLATELET
Basophils Absolute: 0 10*3/uL (ref 0.0–0.2)
Basos: 0 %
EOS (ABSOLUTE): 0 10*3/uL (ref 0.0–0.4)
Eos: 0 %
Hematocrit: 39.1 % (ref 37.5–51.0)
Hemoglobin: 13 g/dL (ref 13.0–17.7)
Immature Grans (Abs): 0 10*3/uL (ref 0.0–0.1)
Immature Granulocytes: 0 %
Lymphocytes Absolute: 1.2 10*3/uL (ref 0.7–3.1)
Lymphs: 17 %
MCH: 31.8 pg (ref 26.6–33.0)
MCHC: 33.2 g/dL (ref 31.5–35.7)
MCV: 96 fL (ref 79–97)
Monocytes Absolute: 0.6 10*3/uL (ref 0.1–0.9)
Monocytes: 8 %
Neutrophils Absolute: 5.5 10*3/uL (ref 1.4–7.0)
Neutrophils: 75 %
Platelets: 287 10*3/uL (ref 150–450)
RBC: 4.09 x10E6/uL — ABNORMAL LOW (ref 4.14–5.80)
RDW: 12.8 % (ref 11.6–15.4)
WBC: 7.3 10*3/uL (ref 3.4–10.8)

## 2019-07-13 LAB — BASIC METABOLIC PANEL
BUN/Creatinine Ratio: 19 (ref 10–24)
BUN: 17 mg/dL (ref 8–27)
CO2: 26 mmol/L (ref 20–29)
Calcium: 8.8 mg/dL (ref 8.6–10.2)
Chloride: 99 mmol/L (ref 96–106)
Creatinine, Ser: 0.91 mg/dL (ref 0.76–1.27)
GFR calc Af Amer: 94 mL/min/{1.73_m2} (ref >59)
GFR calc non Af Amer: 82 mL/min/{1.73_m2} (ref >59)
Glucose: 267 mg/dL — ABNORMAL HIGH (ref 65–99)
Potassium: 5.2 mmol/L (ref 3.5–5.2)
Sodium: 138 mmol/L (ref 134–144)

## 2019-07-13 LAB — IRON,TIBC AND FERRITIN PANEL
Ferritin: 139 ng/mL (ref 30–400)
Iron Saturation: 44 % (ref 15–55)
Iron: 108 ug/dL (ref 38–169)
Total Iron Binding Capacity: 248 ug/dL — ABNORMAL LOW (ref 250–450)
UIBC: 140 ug/dL (ref 111–343)

## 2019-07-13 LAB — VITAMIN D 25 HYDROXY (VIT D DEFICIENCY, FRACTURES): Vit D, 25-Hydroxy: 25.7 ng/mL — ABNORMAL LOW (ref 30.0–100.0)

## 2019-07-13 NOTE — Therapy (Signed)
[facility] [address], [place], [address] Phone: [phone]   Fax:  [phone]  Physical Therapy Treatment  Patient Details  Name: Jason Contreras MRN: [id number] Date of Birth: 1940/11/18 No data recorded  Encounter Date: 07/13/2019  PT End of Session - 07/13/19 1607    Visit Number  3    Number of Visits  13    Date for PT Re-Evaluation  08/24/19    Authorization Type  3/10    PT Start Time  1600    PT Stop Time  1645    PT Time Calculation (min)  45 min    Equipment Utilized During Treatment  Gait belt    Activity Tolerance  Patient limited by pain    Behavior During Therapy  WFL for tasks assessed/performed       Past Medical History:  Diagnosis Date  . Adenocarcinoma, renal cell ([place])   . Arthritis   . Diabetes mellitus without complication ([place])   . Elevated PSA   . Headache   . Hematuria   . Hyperlipidemia   . Hypertension   . Neuropathy   . Obesity   . PONV (postoperative nausea and vomiting)   . Renal insufficiency   . Right renal mass     Past Surgical History:  Procedure Laterality Date  . APPENDECTOMY    . CRYOABLATION  10/17/2017  . IR RADIOLOGIST EVAL & MGMT  07/17/2017  . IR RADIOLOGIST EVAL & MGMT  08/13/2017  . IR RADIOLOGIST EVAL & MGMT  11/12/2017  . IR RADIOLOGIST EVAL & MGMT  03/05/2018  . RADIOLOGY WITH ANESTHESIA Right 10/17/2017   Procedure: CT WITH ANESTHESIA RENAL CRYOABLATION;  Surgeon: [name], MD;  Location: [facility];  Service: Anesthesiology;  Laterality: Right;  . ROBOTIC ASSITED PARTIAL NEPHRECTOMY Right 12/12/2015   Procedure: ROBOTIC ASSITED PARTIAL NEPHRECTOMY;  Surgeon: [name], MD;  Location: [facility];  Service: Urology;  Laterality: Right;    There were no vitals filed for this visit.  Subjective Assessment - 07/13/19 1605    Subjective  Patient reports he is feeling "woozey" today. Patient states he is feeling okay right now.     Pertinent History  Hx of renal cancer, Family Hx of bone cancer, sudden unexplained weight loss, decreased appetite, Decreased saddle sensation, and increasing spread of pain throughout body    Limitations  Lifting;Standing    How long can you stand comfortably?  5 minutes    How long can you walk comfortably?  can't walk w/o pain    Patient Stated Goals  get back to walking pain free    Currently in Pain?  No/denies    Pain Onset  More than a month ago    Pain Onset  More than a month ago       TREATMENT  Therapeutic Exercise Ball rolls out with foot on ball -- 3 x 30sec Knee extension with GTB -- x 20  Knee flexion with GTB -- x 20  Leg Press --  x15 75# to improve leg strength Hip abduction in standing -- stopped secondary to knee pain Lumbar extension - x 20  B Heel lifts - x 20  Side stepping with 6" with focus on improving hip strengthening - x 20  Pre-gait in standing with weight shifts  -- x 20  Straight arm lumbar extension with UE support -- x 20    Performed exercises to improve pain and spasms and  decrease pain overall within the knee/back   PT Education - 07/13/19 1607    Education Details  form/technique with exercise;    Person(s) Educated  Patient    Methods  Explanation;Demonstration    Comprehension  Verbalized understanding;Returned demonstration       PT Short Term Goals - 06/29/19 1512      PT SHORT TERM GOAL #1   Title  Patient will be independent in HEP in order to reduce number of visits    Baseline  --    Time  2    Period  Weeks    Status  New    Target Date  07/13/19        PT Long Term Goals - 06/29/19 1513      PT LONG TERM GOAL #1   Title  Patient will be able to obtain full knee extension range of motion to ambulate appropriately    Baseline  -20 degree knee extension    Time  6    Period  Weeks    Status  New    Target Date  08/24/19      PT LONG TERM GOAL #2   Title  Patient will have his worst knee pain reduced to 5/10 on  NPRS in order to feel more comfortable walking around the house    Baseline  eval: 9/10    Time  6    Period  Weeks    Status  New    Target Date  08/10/19      PT LONG TERM GOAL #3   Title  Patient will show an ability to walk for 5 minutes without an increase in pain    Baseline  pain immediately    Time  6    Period  Weeks    Status  New    Target Date  08/10/19            Plan - 07/13/19 1615    Clinical Impression Statement  Continued to focus on improving quad and hamstring strength to better improve knee pain and walking for prolonged periods of time. Patient able to lift greater amount of repetitions for leg press compared to previous sessions. Focused on hip strength to continue focusing on decreasing knee pain and patient will continue to benefit from further skilled threapy focused on strengthening to return to prior level of function.    Personal Factors and Comorbidities  Age;Comorbidity 3+    Comorbidities  Diabetes, chronic pain, Hx CA    Examination-Activity Limitations  Bed Mobility;Locomotion Level;Transfers;Squat;Stairs;Sleep    Stability/Clinical Decision Making  Unstable/Unpredictable    Rehab Potential  Fair    PT Frequency  2x / week    PT Duration  8 weeks    PT Treatment/Interventions  ADLs/Self Care Home Management;Moist Heat;Traction;Biofeedback;Ultrasound;Cryotherapy;Electrical Stimulation;Gait training;Stair training;Functional mobility training;Therapeutic activities;Therapeutic exercise;Balance training;Neuromuscular re-education;Patient/family education;Manual techniques;Passive range of motion;Dry needling;Energy conservation;Taping;Spinal Manipulations;Joint Manipulations    PT Next Visit Plan  Address ROM impairments at the knee with STM and joint mobs    PT Home Exercise Plan  AROM on ball    Consulted and Agree with Plan of Care  Patient       Patient will benefit from skilled therapeutic intervention in order to improve the following  deficits and impairments:  Abnormal gait, Decreased endurance, Decreased coordination, Decreased range of motion, Difficulty walking, Decreased activity tolerance, Impaired UE functional use, Impaired vision/preception, Pain, Decreased balance, Hypomobility, Decreased mobility, Decreased strength  Visit Diagnosis: Chronic bilateral  low back pain without sciatica  Pain in left hip  Chronic pain of left knee     Problem List Patient Active Problem List   Diagnosis Date Noted  . Orthostatic hypotension 07/12/2019  . B12 deficiency 07/12/2019  . Low hemoglobin 07/12/2019  . CRP elevated 06/30/2019  . Pain syndrome, chronic 06/30/2019  . Primary osteoarthritis involving multiple joints 06/30/2019  . Vitamin D deficiency 06/30/2019  . DJD (degenerative joint disease), lumbosacral 11/04/2017  . Personal history of renal cancer 10/17/2017  . Advanced care planning/counseling discussion 01/13/2017  . Senile purpura ([place]) 09/26/2016  . Elevated PSA 01/07/2015  . BPH with obstruction/lower urinary tract symptoms 01/07/2015  . DM due to underlying condition with diabetic nephropathy ([place])   . Hypertension associated with diabetes ([place])   . Hyperlipidemia associated with type 2 diabetes mellitus ([place])     Blythe Stanford, PT DPT 07/13/2019, 4:52 PM  [facility] [address], [place], [address] Phone: [phone]   Fax:  [phone]  Name: Jason Contreras MRN: [id number] Date of Birth: [birth year]

## 2019-07-13 NOTE — Progress Notes (Signed)
Contacted via MyChart

## 2019-07-15 ENCOUNTER — Ambulatory Visit: Payer: Medicare Other

## 2019-07-15 DIAGNOSIS — M8949 Other hypertrophic osteoarthropathy, multiple sites: Secondary | ICD-10-CM | POA: Diagnosis not present

## 2019-07-15 DIAGNOSIS — Z79899 Other long term (current) drug therapy: Secondary | ICD-10-CM | POA: Diagnosis not present

## 2019-07-15 DIAGNOSIS — M1380 Other specified arthritis, unspecified site: Secondary | ICD-10-CM | POA: Diagnosis not present

## 2019-07-20 ENCOUNTER — Other Ambulatory Visit: Payer: Self-pay

## 2019-07-20 ENCOUNTER — Ambulatory Visit
Admission: RE | Admit: 2019-07-20 | Discharge: 2019-07-20 | Disposition: A | Discharge status: Home / Self Care | Payer: Medicare Other | Source: Ambulatory Visit | Attending: Urology | Admitting: Urology

## 2019-07-20 ENCOUNTER — Ambulatory Visit: Payer: Medicare Other

## 2019-07-20 DIAGNOSIS — I700 Atherosclerosis of aorta: Secondary | ICD-10-CM | POA: Diagnosis not present

## 2019-07-20 DIAGNOSIS — M5136 Other intervertebral disc degeneration, lumbar region: Secondary | ICD-10-CM | POA: Diagnosis not present

## 2019-07-20 DIAGNOSIS — M25552 Pain in left hip: Secondary | ICD-10-CM

## 2019-07-20 DIAGNOSIS — R634 Abnormal weight loss: Secondary | ICD-10-CM

## 2019-07-20 DIAGNOSIS — M545 Low back pain: Secondary | ICD-10-CM | POA: Diagnosis not present

## 2019-07-20 DIAGNOSIS — Z85528 Personal history of other malignant neoplasm of kidney: Secondary | ICD-10-CM

## 2019-07-20 DIAGNOSIS — G8929 Other chronic pain: Secondary | ICD-10-CM | POA: Diagnosis not present

## 2019-07-20 DIAGNOSIS — C641 Malignant neoplasm of right kidney, except renal pelvis: Secondary | ICD-10-CM | POA: Diagnosis not present

## 2019-07-20 DIAGNOSIS — M25562 Pain in left knee: Secondary | ICD-10-CM | POA: Diagnosis not present

## 2019-07-20 HISTORY — DX: Systemic involvement of connective tissue, unspecified: M35.9

## 2019-07-20 MED ORDER — IOHEXOL 300 MG/ML  SOLN
100.0000 mL | Freq: Once | INTRAMUSCULAR | Status: AC | PRN
  Administered 2019-07-20: 100 mL via INTRAVENOUS

## 2019-07-20 NOTE — Therapy (Signed)
[facility] [address], [place], [address] Phone: [phone]   Fax:  [phone]  Physical Therapy Treatment  Patient Details  Name: Jason Contreras MRN: [id number] Date of Birth: 07/01/1940 No data recorded  Encounter Date: 07/20/2019  PT End of Session - 07/20/19 1532    Visit Number  4    Number of Visits  13    Date for PT Re-Evaluation  08/24/19    Authorization Type  4/10    PT Start Time  1520    PT Stop Time  1600    PT Time Calculation (min)  40 min    Equipment Utilized During Treatment  Gait belt    Activity Tolerance  Patient limited by pain    Behavior During Therapy  WFL for tasks assessed/performed       Past Medical History:  Diagnosis Date  . Adenocarcinoma, renal cell ([place])   . Arthritis   . Collagen vascular disease ([place])   . Diabetes mellitus without complication ([place])   . Elevated PSA   . Headache   . Hematuria   . Hyperlipidemia   . Hypertension   . Neuropathy   . Obesity   . PONV (postoperative nausea and vomiting)   . Renal insufficiency   . Right renal mass     Past Surgical History:  Procedure Laterality Date  . APPENDECTOMY    . CRYOABLATION  10/17/2017  . IR RADIOLOGIST EVAL & MGMT  07/17/2017  . IR RADIOLOGIST EVAL & MGMT  08/13/2017  . IR RADIOLOGIST EVAL & MGMT  11/12/2017  . IR RADIOLOGIST EVAL & MGMT  03/05/2018  . RADIOLOGY WITH ANESTHESIA Right 10/17/2017   Procedure: CT WITH ANESTHESIA RENAL CRYOABLATION;  Surgeon: [name], MD;  Location: [facility];  Service: Anesthesiology;  Laterality: Right;  . ROBOTIC ASSITED PARTIAL NEPHRECTOMY Right 12/12/2015   Procedure: ROBOTIC ASSITED PARTIAL NEPHRECTOMY;  Surgeon: [name], MD;  Location: [facility];  Service: Urology;  Laterality: Right;    There were no vitals filed for this visit.  Subjective Assessment - 07/20/19 1524    Subjective  Patient states he went to his CT scan scan which resulted in no  positive findings. Patient states no major changes overwise.    Pertinent History  Hx of renal cancer, Family Hx of bone cancer, sudden unexplained weight loss, decreased appetite, Decreased saddle sensation, and increasing spread of pain throughout body    Limitations  Lifting;Standing    How long can you stand comfortably?  5 minutes    How long can you walk comfortably?  can't walk w/o pain    Patient Stated Goals  get back to walking pain free    Currently in Pain?  No/denies    Pain Onset  More than a month ago    Pain Onset  More than a month ago           TREATMENT  Therapeutic Exercise Ball rolls out with foot on ball -- 3 x 30sec Knee extension with GTB -- x 20  Seated pelvic tilts in sitting - x 20 with YTB Hip abduction in standing - x15 Lumbar extension - x 20 in standing Leg Press - x20 85# to improve leg strength Squats with UE support - x 20  Performed exercises to improve pain and spasms and decrease pain overall within the knee/back   PT Education - 07/20/19 1529    Education Details  form/technique with exercise  Person(s) Educated  Patient    Methods  Explanation;Demonstration    Comprehension  Verbalized understanding;Returned demonstration       PT Short Term Goals - 06/29/19 1512      PT SHORT TERM GOAL #1   Title  Patient will be independent in HEP in order to reduce number of visits    Baseline  --    Time  2    Period  Weeks    Status  New    Target Date  07/13/19        PT Long Term Goals - 06/29/19 1513      PT LONG TERM GOAL #1   Title  Patient will be able to obtain full knee extension range of motion to ambulate appropriately    Baseline  -20 degree knee extension    Time  6    Period  Weeks    Status  New    Target Date  08/24/19      PT LONG TERM GOAL #2   Title  Patient will have his worst knee pain reduced to 5/10 on NPRS in order to feel more comfortable walking around the house    Baseline  eval: 9/10    Time  6     Period  Weeks    Status  New    Target Date  08/10/19      PT LONG TERM GOAL #3   Title  Patient will show an ability to walk for 5 minutes without an increase in pain    Baseline  pain immediately    Time  6    Period  Weeks    Status  New    Target Date  08/10/19            Plan - 07/20/19 1533    Clinical Impression Statement  Patient demonstrates improvement in symptoms and is able to tolerate further resistance with leg press and performing more difficult exercises. Patient does demonstrate intermittent pain with SLS motions indicated decreased hip stabilization. Patient will benefit from further skilled therapy to return to prior level of funciton.    Personal Factors and Comorbidities  Age;Comorbidity 3+    Comorbidities  Diabetes, chronic pain, Hx CA    Examination-Activity Limitations  Bed Mobility;Locomotion Level;Transfers;Squat;Stairs;Sleep    Stability/Clinical Decision Making  Unstable/Unpredictable    Rehab Potential  Fair    PT Frequency  2x / week    PT Duration  8 weeks    PT Treatment/Interventions  ADLs/Self Care Home Management;Moist Heat;Traction;Biofeedback;Ultrasound;Cryotherapy;Electrical Stimulation;Gait training;Stair training;Functional mobility training;Therapeutic activities;Therapeutic exercise;Balance training;Neuromuscular re-education;Patient/family education;Manual techniques;Passive range of motion;Dry needling;Energy conservation;Taping;Spinal Manipulations;Joint Manipulations    PT Next Visit Plan  Address ROM impairments at the knee with STM and joint mobs    PT Home Exercise Plan  AROM on ball    Consulted and Agree with Plan of Care  Patient       Patient will benefit from skilled therapeutic intervention in order to improve the following deficits and impairments:  Abnormal gait, Decreased endurance, Decreased coordination, Decreased range of motion, Difficulty walking, Decreased activity tolerance, Impaired UE functional use, Impaired  vision/preception, Pain, Decreased balance, Hypomobility, Decreased mobility, Decreased strength  Visit Diagnosis: Chronic bilateral low back pain without sciatica  Pain in left hip     Problem List Patient Active Problem List   Diagnosis Date Noted  . Orthostatic hypotension 07/12/2019  . B12 deficiency 07/12/2019  . Low hemoglobin 07/12/2019  . CRP elevated 06/30/2019  .  Pain syndrome, chronic 06/30/2019  . Primary osteoarthritis involving multiple joints 06/30/2019  . Vitamin D deficiency 06/30/2019  . DJD (degenerative joint disease), lumbosacral 11/04/2017  . Personal history of renal cancer 10/17/2017  . Advanced care planning/counseling discussion 01/13/2017  . Senile purpura ([place]) 09/26/2016  . Elevated PSA 01/07/2015  . BPH with obstruction/lower urinary tract symptoms 01/07/2015  . DM due to underlying condition with diabetic nephropathy ([place])   . Hypertension associated with diabetes ([place])   . Hyperlipidemia associated with type 2 diabetes mellitus ([place])     Blythe Stanford, PT DPT 07/20/2019, 4:00 PM  [facility] [address] [place], [address] Phone: [phone]   Fax:  [phone]  Name: MIGUELANGEL TRAY MRN: [id number] Date of Birth: 01-May-1941

## 2019-07-21 ENCOUNTER — Telehealth: Payer: Self-pay | Admitting: *Deleted

## 2019-07-21 NOTE — Telephone Encounter (Addendum)
Patient notified, verbalized understanding.    ----- Message from [name], MD sent at 07/20/2019  1:55 PM EDT ----- CT scan looks great, no evidence of recurrent kidney cancer.

## 2019-07-22 ENCOUNTER — Ambulatory Visit: Payer: Medicare Other

## 2019-07-23 ENCOUNTER — Ambulatory Visit
Admission: RE | Admit: 2019-07-23 | Discharge: 2019-07-23 | Disposition: A | Discharge status: Home / Self Care | Payer: Medicare Other | Source: Ambulatory Visit | Attending: Urology | Admitting: Urology

## 2019-07-23 ENCOUNTER — Other Ambulatory Visit: Payer: Self-pay

## 2019-07-23 DIAGNOSIS — R972 Elevated prostate specific antigen [PSA]: Secondary | ICD-10-CM | POA: Diagnosis present

## 2019-07-23 MED ORDER — GADOBUTROL 1 MMOL/ML IV SOLN
9.0000 mL | Freq: Once | INTRAVENOUS | Status: AC | PRN
  Administered 2019-07-23: 9 mL via INTRAVENOUS

## 2019-07-27 ENCOUNTER — Ambulatory Visit: Payer: Medicare Other

## 2019-07-27 ENCOUNTER — Other Ambulatory Visit: Payer: Self-pay

## 2019-07-27 DIAGNOSIS — G8929 Other chronic pain: Secondary | ICD-10-CM

## 2019-07-27 DIAGNOSIS — M25552 Pain in left hip: Secondary | ICD-10-CM

## 2019-07-27 DIAGNOSIS — M545 Low back pain: Secondary | ICD-10-CM | POA: Diagnosis not present

## 2019-07-27 DIAGNOSIS — M25562 Pain in left knee: Secondary | ICD-10-CM | POA: Diagnosis not present

## 2019-07-27 DIAGNOSIS — M5136 Other intervertebral disc degeneration, lumbar region: Secondary | ICD-10-CM | POA: Diagnosis not present

## 2019-07-28 NOTE — Therapy (Signed)
[facility] [address] [place], [address] Phone: [phone]   Fax:  [phone]  Physical Therapy Treatment  Patient Details  Name: Jason Contreras MRN: [id number] Date of Birth: [birth year] No data recorded  Encounter Date: 07/27/2019  PT End of Session - 07/27/19 1527    Visit Number  5    Number of Visits  13    Date for PT Re-Evaluation  08/24/19    Authorization Type  5/10    PT Start Time  1520    PT Stop Time  1600    PT Time Calculation (min)  40 min    Equipment Utilized During Treatment  Gait belt    Activity Tolerance  Patient limited by pain    Behavior During Therapy  WFL for tasks assessed/performed       Past Medical History:  Diagnosis Date  . Adenocarcinoma, renal cell ([place])   . Arthritis   . Collagen vascular disease ([place])   . Diabetes mellitus without complication ([place])   . Elevated PSA   . Headache   . Hematuria   . Hyperlipidemia   . Hypertension   . Neuropathy   . Obesity   . PONV (postoperative nausea and vomiting)   . Renal insufficiency   . Right renal mass     Past Surgical History:  Procedure Laterality Date  . APPENDECTOMY    . CRYOABLATION  10/17/2017  . IR RADIOLOGIST EVAL & MGMT  07/17/2017  . IR RADIOLOGIST EVAL & MGMT  08/13/2017  . IR RADIOLOGIST EVAL & MGMT  11/12/2017  . IR RADIOLOGIST EVAL & MGMT  03/05/2018  . RADIOLOGY WITH ANESTHESIA Right 10/17/2017   Procedure: CT WITH ANESTHESIA RENAL CRYOABLATION;  Surgeon: [name], MD;  Location: [facility];  Service: Anesthesiology;  Laterality: Right;  . ROBOTIC ASSITED PARTIAL NEPHRECTOMY Right 12/12/2015   Procedure: ROBOTIC ASSITED PARTIAL NEPHRECTOMY;  Surgeon: [name], MD;  Location: [facility];  Service: Urology;  Laterality: Right;    There were no vitals filed for this visit.  Subjective Assessment - 07/27/19 1522    Subjective  Patient reports he has prostate cancer from one of his scans.  Patient states he still has knee pain and has been having increase back pain.    Pertinent History  Hx of renal cancer, Family Hx of bone cancer, sudden unexplained weight loss, decreased appetite, Decreased saddle sensation, and increasing spread of pain throughout body    Limitations  Lifting;Standing    How long can you stand comfortably?  5 minutes    How long can you walk comfortably?  can't walk w/o pain    Patient Stated Goals  get back to walking pain free    Currently in Pain?  Yes    Pain Score  5     Pain Location  Back    Pain Orientation  Left    Pain Descriptors / Indicators  Aching    Pain Type  Chronic pain    Pain Onset  More than a month ago    Pain Frequency  Intermittent    Pain Onset  More than a month ago        TREATMENT  Therapeutic Exercise Ball rolls out with foot on ball -- 3 x 30sec Seated pelvic tilts in sitting - x 20  Lumbar rotation in sitting with GTB - x 20  Sit to stands in standing - x 20 Standing shoulder extension for  core and lumbar control - x 20  Hip abduction in standing - x15 with GTB around knees Ball self mobilization  Manual therapy STM to the lumbar spine  Performed exercises to improve pain and spasms and decrease pain overall within the knee/back  PT Education - 07/27/19 1526    Education Details  form/technique with exercise    Person(s) Educated  Patient    Methods  Explanation;Demonstration    Comprehension  Verbalized understanding;Returned demonstration       PT Short Term Goals - 06/29/19 1512      PT SHORT TERM GOAL #1   Title  Patient will be independent in HEP in order to reduce number of visits    Baseline  --    Time  2    Period  Weeks    Status  New    Target Date  07/13/19        PT Long Term Goals - 06/29/19 1513      PT LONG TERM GOAL #1   Title  Patient will be able to obtain full knee extension range of motion to ambulate appropriately    Baseline  -20 degree knee extension    Time  6     Period  Weeks    Status  New    Target Date  08/24/19      PT LONG TERM GOAL #2   Title  Patient will have his worst knee pain reduced to 5/10 on NPRS in order to feel more comfortable walking around the house    Baseline  eval: 9/10    Time  6    Period  Weeks    Status  New    Target Date  08/10/19      PT LONG TERM GOAL #3   Title  Patient will show an ability to walk for 5 minutes without an increase in pain    Baseline  pain immediately    Time  6    Period  Weeks    Status  New    Target Date  08/10/19            Plan - 07/28/19 1102    Clinical Impression Statement  Continued to focus on improving limitations in strength, standing, and walking for prolonged periods of time. Patient demonstrates slight increase in pain with performing exercises in standing today. Patient continues to have difficulty with walking and is limited in pain, performed exercises throughout decreased AROM. Patient will beenfit from further skilled therapy to return to prior level of function.    Personal Factors and Comorbidities  Age;Comorbidity 3+    Comorbidities  Diabetes, chronic pain, Hx CA    Examination-Activity Limitations  Bed Mobility;Locomotion Level;Transfers;Squat;Stairs;Sleep    Stability/Clinical Decision Making  Unstable/Unpredictable    Rehab Potential  Fair    PT Frequency  2x / week    PT Duration  8 weeks    PT Treatment/Interventions  ADLs/Self Care Home Management;Moist Heat;Traction;Biofeedback;Ultrasound;Cryotherapy;Electrical Stimulation;Gait training;Stair training;Functional mobility training;Therapeutic activities;Therapeutic exercise;Balance training;Neuromuscular re-education;Patient/family education;Manual techniques;Passive range of motion;Dry needling;Energy conservation;Taping;Spinal Manipulations;Joint Manipulations    PT Next Visit Plan  Address ROM impairments at the knee with STM and joint mobs    PT Home Exercise Plan  AROM on ball    Consulted and Agree  with Plan of Care  Patient       Patient will benefit from skilled therapeutic intervention in order to improve the following deficits and impairments:  Abnormal gait, Decreased endurance, Decreased  coordination, Decreased range of motion, Difficulty walking, Decreased activity tolerance, Impaired UE functional use, Impaired vision/preception, Pain, Decreased balance, Hypomobility, Decreased mobility, Decreased strength  Visit Diagnosis: Chronic bilateral low back pain without sciatica  Pain in left hip     Problem List Patient Active Problem List   Diagnosis Date Noted  . Orthostatic hypotension 07/12/2019  . B12 deficiency 07/12/2019  . Low hemoglobin 07/12/2019  . CRP elevated 06/30/2019  . Pain syndrome, chronic 06/30/2019  . Primary osteoarthritis involving multiple joints 06/30/2019  . Vitamin D deficiency 06/30/2019  . DJD (degenerative joint disease), lumbosacral 11/04/2017  . Personal history of renal cancer 10/17/2017  . Advanced care planning/counseling discussion 01/13/2017  . Senile purpura ([place]) 09/26/2016  . Elevated PSA 01/07/2015  . BPH with obstruction/lower urinary tract symptoms 01/07/2015  . DM due to underlying condition with diabetic nephropathy ([place])   . Hypertension associated with diabetes ([place])   . Hyperlipidemia associated with type 2 diabetes mellitus ([place])     Blythe Stanford, PT DPT 07/28/2019, 11:09 AM  [facility] [address] [place], [address] Phone: [phone]   Fax:  [phone]  Name: MALAKHI KIBBEY MRN: [id number] Date of Birth: 1940-09-05

## 2019-07-29 ENCOUNTER — Ambulatory Visit: Payer: Medicare Other

## 2019-07-29 ENCOUNTER — Other Ambulatory Visit: Payer: Self-pay

## 2019-07-29 DIAGNOSIS — G8929 Other chronic pain: Secondary | ICD-10-CM

## 2019-07-29 DIAGNOSIS — M25552 Pain in left hip: Secondary | ICD-10-CM | POA: Diagnosis not present

## 2019-07-29 DIAGNOSIS — M545 Low back pain: Secondary | ICD-10-CM | POA: Diagnosis not present

## 2019-07-29 DIAGNOSIS — M25562 Pain in left knee: Secondary | ICD-10-CM | POA: Diagnosis not present

## 2019-07-29 DIAGNOSIS — M5136 Other intervertebral disc degeneration, lumbar region: Secondary | ICD-10-CM | POA: Diagnosis not present

## 2019-07-29 NOTE — Therapy (Signed)
[facility] [address] [place], [address] Phone: [phone]   Fax:  [phone]  Physical Therapy Treatment  Patient Details  Name: Jason Contreras MRN: [id number] Date of Birth: 1940-10-10 No data recorded  Encounter Date: 07/29/2019  PT End of Session - 07/29/19 1532    Visit Number  6    Number of Visits  13    Date for PT Re-Evaluation  08/24/19    Authorization Type  6/10    PT Start Time  1515    PT Stop Time  1600    PT Time Calculation (min)  45 min    Equipment Utilized During Treatment  Gait belt    Activity Tolerance  Patient limited by pain    Behavior During Therapy  WFL for tasks assessed/performed       Past Medical History:  Diagnosis Date  . Adenocarcinoma, renal cell ([place])   . Arthritis   . Collagen vascular disease ([place])   . Diabetes mellitus without complication ([place])   . Elevated PSA   . Headache   . Hematuria   . Hyperlipidemia   . Hypertension   . Neuropathy   . Obesity   . PONV (postoperative nausea and vomiting)   . Renal insufficiency   . Right renal mass     Past Surgical History:  Procedure Laterality Date  . APPENDECTOMY    . CRYOABLATION  10/17/2017  . IR RADIOLOGIST EVAL & MGMT  07/17/2017  . IR RADIOLOGIST EVAL & MGMT  08/13/2017  . IR RADIOLOGIST EVAL & MGMT  11/12/2017  . IR RADIOLOGIST EVAL & MGMT  03/05/2018  . RADIOLOGY WITH ANESTHESIA Right 10/17/2017   Procedure: CT WITH ANESTHESIA RENAL CRYOABLATION;  Surgeon: [name], MD;  Location: [facility];  Service: Anesthesiology;  Laterality: Right;  . ROBOTIC ASSITED PARTIAL NEPHRECTOMY Right 12/12/2015   Procedure: ROBOTIC ASSITED PARTIAL NEPHRECTOMY;  Surgeon: [name], MD;  Location: [facility];  Service: Urology;  Laterality: Right;    There were no vitals filed for this visit.  Subjective Assessment - 07/29/19 1521    Subjective  Patient states the knee has been feeling great today. Patient  states no major changes since the previous session.    Pertinent History  Hx of renal cancer, Family Hx of bone cancer, sudden unexplained weight loss, decreased appetite, Decreased saddle sensation, and increasing spread of pain throughout body    Limitations  Lifting;Standing    How long can you stand comfortably?  5 minutes    How long can you walk comfortably?  can't walk w/o pain    Patient Stated Goals  get back to walking pain free    Currently in Pain?  No/denies    Pain Onset  More than a month ago    Pain Onset  More than a month ago       TREATMENT  Therapeutic Exercise Ball rolls out with foot on ball -- 3 x 30 Squats with UE support - x 20 with UE support; x15 with GTB Side stepping with GTB around the knees - 2 x 20 Heel raises with UE support - x 20  Single leg stance on airex pad - 3 x 30sec Side step ups with UE support - 2 x 15  Heel taps in standing with UE support - off of 6" step - x 8 B Leg Press - x20 85# to improve leg strength Performed exercises to improve pain and  spasms and decrease pain overall within the knee/back   PT Education - 07/29/19 1531    Education Details  form/technique with exercise    Person(s) Educated  Patient    Methods  Explanation;Demonstration    Comprehension  Verbalized understanding;Returned demonstration       PT Short Term Goals - 06/29/19 1512      PT SHORT TERM GOAL #1   Title  Patient will be independent in HEP in order to reduce number of visits    Baseline  --    Time  2    Period  Weeks    Status  New    Target Date  07/13/19        PT Long Term Goals - 06/29/19 1513      PT LONG TERM GOAL #1   Title  Patient will be able to obtain full knee extension range of motion to ambulate appropriately    Baseline  -20 degree knee extension    Time  6    Period  Weeks    Status  New    Target Date  08/24/19      PT LONG TERM GOAL #2   Title  Patient will have his worst knee pain reduced to 5/10 on NPRS in order  to feel more comfortable walking around the house    Baseline  eval: 9/10    Time  6    Period  Weeks    Status  New    Target Date  08/10/19      PT LONG TERM GOAL #3   Title  Patient will show an ability to walk for 5 minutes without an increase in pain    Baseline  pain immediately    Time  6    Period  Weeks    Status  New    Target Date  08/10/19            Plan - 07/29/19 1537    Clinical Impression Statement  Able to perform greater amount exercises in standing today versus previous sessions indicating functional carryover between treatment sessions. Although patient is improving, he continues to have increased difficulties with performing squatting and motions through resisted knee flexion. Patient demosntrates overall decreased pain, patient will benefit from further skilled therapy to return to prior level of function.    Personal Factors and Comorbidities  Age;Comorbidity 3+    Comorbidities  Diabetes, chronic pain, Hx CA    Examination-Activity Limitations  Bed Mobility;Locomotion Level;Transfers;Squat;Stairs;Sleep    Stability/Clinical Decision Making  Unstable/Unpredictable    Rehab Potential  Fair    PT Frequency  2x / week    PT Duration  8 weeks    PT Treatment/Interventions  ADLs/Self Care Home Management;Moist Heat;Traction;Biofeedback;Ultrasound;Cryotherapy;Electrical Stimulation;Gait training;Stair training;Functional mobility training;Therapeutic activities;Therapeutic exercise;Balance training;Neuromuscular re-education;Patient/family education;Manual techniques;Passive range of motion;Dry needling;Energy conservation;Taping;Spinal Manipulations;Joint Manipulations    PT Next Visit Plan  Address ROM impairments at the knee with STM and joint mobs    PT Home Exercise Plan  AROM on ball    Consulted and Agree with Plan of Care  Patient       Patient will benefit from skilled therapeutic intervention in order to improve the following deficits and impairments:   Abnormal gait, Decreased endurance, Decreased coordination, Decreased range of motion, Difficulty walking, Decreased activity tolerance, Impaired UE functional use, Impaired vision/preception, Pain, Decreased balance, Hypomobility, Decreased mobility, Decreased strength  Visit Diagnosis: Chronic bilateral low back pain without sciatica  Pain in left  hip  Chronic pain of left knee     Problem List Patient Active Problem List   Diagnosis Date Noted  . Orthostatic hypotension 07/12/2019  . B12 deficiency 07/12/2019  . Low hemoglobin 07/12/2019  . CRP elevated 06/30/2019  . Pain syndrome, chronic 06/30/2019  . Primary osteoarthritis involving multiple joints 06/30/2019  . Vitamin D deficiency 06/30/2019  . DJD (degenerative joint disease), lumbosacral 11/04/2017  . Personal history of renal cancer 10/17/2017  . Advanced care planning/counseling discussion 01/13/2017  . Senile purpura ([place]) 09/26/2016  . Elevated PSA 01/07/2015  . BPH with obstruction/lower urinary tract symptoms 01/07/2015  . DM due to underlying condition with diabetic nephropathy ([place])   . Hypertension associated with diabetes ([place])   . Hyperlipidemia associated with type 2 diabetes mellitus ([place])     Blythe Stanford, PT DPT 07/29/2019, 3:58 PM  [facility] [address], [place], [address] Phone: [phone]   Fax:  [phone]  Name: Jason Contreras MRN: [id number] Date of Birth: 11-Dec-1940

## 2019-08-03 ENCOUNTER — Ambulatory Visit (INDEPENDENT_AMBULATORY_CARE_PROVIDER_SITE_OTHER): Payer: Medicare Other | Admitting: General Practice

## 2019-08-03 ENCOUNTER — Telehealth: Payer: Self-pay | Admitting: General Practice

## 2019-08-03 ENCOUNTER — Other Ambulatory Visit: Payer: Self-pay

## 2019-08-03 ENCOUNTER — Ambulatory Visit: Payer: Medicare Other

## 2019-08-03 DIAGNOSIS — G8929 Other chronic pain: Secondary | ICD-10-CM | POA: Diagnosis not present

## 2019-08-03 DIAGNOSIS — M5136 Other intervertebral disc degeneration, lumbar region: Secondary | ICD-10-CM | POA: Diagnosis not present

## 2019-08-03 DIAGNOSIS — E782 Mixed hyperlipidemia: Secondary | ICD-10-CM

## 2019-08-03 DIAGNOSIS — E0821 Diabetes mellitus due to underlying condition with diabetic nephropathy: Secondary | ICD-10-CM | POA: Diagnosis not present

## 2019-08-03 DIAGNOSIS — I10 Essential (primary) hypertension: Secondary | ICD-10-CM | POA: Diagnosis not present

## 2019-08-03 DIAGNOSIS — G894 Chronic pain syndrome: Secondary | ICD-10-CM

## 2019-08-03 DIAGNOSIS — M25562 Pain in left knee: Secondary | ICD-10-CM | POA: Diagnosis not present

## 2019-08-03 DIAGNOSIS — M8949 Other hypertrophic osteoarthropathy, multiple sites: Secondary | ICD-10-CM

## 2019-08-03 DIAGNOSIS — M1990 Unspecified osteoarthritis, unspecified site: Secondary | ICD-10-CM | POA: Diagnosis not present

## 2019-08-03 DIAGNOSIS — M545 Low back pain: Secondary | ICD-10-CM | POA: Diagnosis not present

## 2019-08-03 DIAGNOSIS — M25552 Pain in left hip: Secondary | ICD-10-CM

## 2019-08-03 DIAGNOSIS — E1159 Type 2 diabetes mellitus with other circulatory complications: Secondary | ICD-10-CM | POA: Diagnosis not present

## 2019-08-03 DIAGNOSIS — M159 Polyosteoarthritis, unspecified: Secondary | ICD-10-CM

## 2019-08-03 DIAGNOSIS — I951 Orthostatic hypotension: Secondary | ICD-10-CM

## 2019-08-03 NOTE — Patient Instructions (Signed)
Visit Information  Goals Addressed            This Visit's Progress   . RNCM: I am having a lot of pain especially in my left knee       Current Barriers:  [name] Knowledge Deficits related to pain control in left knee and other pain due to chronic pain/osteoarthritis . Impaired mobility due to pain in left knee- currently working with physical therapy and it is helping him with pain relief from arthritis   Nurse Case Manager Clinical Goal(s):  [name] Over the next 90 days, patient will verbalize understanding of plan for pain control for chronic pain  . Over the next 90 days, patient will work with pcp and CCM team to address needs related to best options for pain relief in patient with chronic pain issues . Over the next 90 days, patient will demonstrate a decrease in pain  exacerbations as evidenced by pain level being less than 6 on a consistent daily basis . Over the next 90 days, patient will attend all scheduled medical appointments: with orthopedic provider and pcp . Over the next 90 days, patient will demonstrate understanding of rationale for each prescribed medication as evidenced by compliance and taking recommended dosages . Over the next 90 days, patient will work with CM team pharmacist to aide in answering questions and concerns about pain medications and safe administration . Over the next 90 days, the patient will demonstrate ongoing self health care management ability as evidenced by decreased pain level, no falls, and increased mobility  Interventions:  . Evaluation of current treatment plan related to unresolved pain due to arthritis worse in the left knee and patient's adherence to plan as established by provider. Per the patient he is taking prednisone and leflunamide and his pain level is 99% better. . Advised patient to call the orthopedic provider for additional recommendations. Patient states the orthopedic doctor can not do anything else for his knee for 3 months- completed   . Reviewed medications with patient and discussed compliance, the patient verbalized he is currently taking prednisone and leflunamide and is having relief from the arthritis pain.  [name] Collaborated with pcp and CCM pharmacist  regarding pain control issues.  CCM pharmacist had a co-visit today via telephone with the patient daughter [name] who wanted to know if a Remicade infusion would help her father. The daughter reports that the patient can not even open a bottle now without pain.  She feels he is not getting any better with the current treatment regimen- completed  . Discussed plans with patient for ongoing care management follow up and provided patient with direct contact information for care management team . Reviewed scheduled/upcoming provider appointments including: 08-26-2019 with pcp, 08-16-2019 with Dr. Meda Coffee for blood work, 08-05-2019 with MD for possible prostate cancer,  and 08/25/2019 with Dr. Mikal Plane . Pharmacy referral for questions about pain medications and polypharmacy.  CCM pharmacist spoke today with the patient daughter on a co-visit call with the CM.  Will further collaborate with the pcp for recommendations. Completed  . Evaluation of patients current level of pain and the patient verbalized he is feeling better than he did when he was 42.  He has 12 more days of taking medications and he is a little concerned after he completes the medication cycle what will happen but he will follow up with the specialist for evaluation and further treatment options.  . Evaluation of PT process. The patient verbalized he is having  PT 2 times a week and doing well. The patient denies any pain at the time of the call.   Patient Self Care Activities:  . Patient verbalizes understanding of arthritis pain and the plan of care for controlling pain . Self administers medications as prescribed . Attends all scheduled provider appointments . Calls provider office for new concerns or questions . Unable  to independently control pain due to arthritis without medication therapy and specialist intervention  . Unable to perform IADLs independently (currently having to use a cane when ambulating)- improvement in mobility, working with PT also   Please see past updates related to this goal by clicking on the "Past Updates" button in the selected goal      . RNCM: I try to take my bp and blood sugar readings daily       Current Barriers:  . Chronic Disease Management support, education, and care coordination needs related to HTN, HLD, and DMII  Clinical Goal(s) related to HTN, HLD, and DMII:  Over the next 90 days, patient will:  . Work with the care management team to address educational, disease management, and care coordination needs  . Begin or continue self health monitoring activities as directed today Measure and record cbg (blood glucose) BID times daily, Measure and record blood pressure 7 times per week, and institue a hearth healthy/ADA diet . Call provider office for new or worsened signs and symptoms Blood glucose findings outside established parameters, Blood pressure findings outside established parameters, and New or worsened symptom related to HTN, HLD, and DM2 . Call care management team with questions or concerns . Verbalize basic understanding of patient centered plan of care established today  Interventions related to HTN, HLD, and DMII:  . Evaluation of current treatment plans and patient's adherence to plan as established by provider . Assessed patient understanding of disease states. The patient has a good understanding of disease processes. The patient has pain under control but is also dealing with possible new diagnosis of prostate cancer- will see specialist on 08-05-2019 . Assessed patient's education and care coordination needs: education on the prednisone making blood sugars elevated.  The patient states he does not know how much longer he will be taking the prednisone   . Provided disease specific education to patient: education and support on dietary restrictions and evaluation of appetite. The patient states that he is maintaining his weight and he also is eating well. He does not eat as much as he use to eat.  Nash Dimmer with appropriate clinical care team members regarding patient needs  Patient Self Care Activities related to HTN, HLD, and DMII:  . Patient is unable to independently self-manage chronic health conditions  Please see past updates related to this goal by clicking on the "Past Updates" button in the selected goal         Patient verbalizes understanding of instructions provided today.   The care management team will reach out to the patient again over the next 60 days.   Noreene Larsson RN, MSN, [facility] Mobile: [phone]

## 2019-08-03 NOTE — Chronic Care Management (AMB) (Signed)
Chronic Care Management   Follow Up Note   08/03/2019 Name: Jason Contreras MRN: [id number] DOB: 1941-01-03  Referred by: Venita Lick, NP Reason for referral : Chronic Care Management (Follow up: Chronic pain, HTN, DM2, HLD)   Jason Contreras is a 79 y.o. year old male who is a primary care patient of [name], [name], NP. The CCM team was consulted for assistance with chronic disease management and care coordination needs.    Review of patient status, including review of consultants reports, relevant laboratory and other test results, and collaboration with appropriate care team members and the patient's provider was performed as part of comprehensive patient evaluation and provision of chronic care management services.    SDOH (Social Determinants of Health) assessments performed: No See Care Plan activities for detailed interventions related to [facility])     Outpatient Encounter Medications as of 08/03/2019  Medication Sig  . leflunomide (ARAVA) 20 MG tablet Take 20 mg by mouth daily.  . predniSONE (DELTASONE) 10 MG tablet Taper - 5, 5, 4, 4, 3, 3, 2, 2, 1, 1, 1, 1  . acetaminophen (TYLENOL) 500 MG tablet Take 1,000 mg by mouth every 6 (six) hours as needed.  Jason Contreras atorvastatin (LIPITOR) 20 MG tablet TAKE 1 TABLET BY MOUTH  DAILY AT 6 PM.  . carvedilol (COREG) 6.25 MG tablet TAKE 1 TABLET BY MOUTH TWO  TIMES DAILY  . Cholecalciferol 1.25 MG (50000 UT) TABS Take 1 tablet by mouth once a week. For 8 weeks and then stop.  Return to office for lab draw.  . DULoxetine (CYMBALTA) 60 MG capsule Take 1 capsule (60 mg total) by mouth daily.  Jason Contreras gabapentin (NEURONTIN) 600 MG tablet Take 1 tablet (600 mg total) by mouth 3 (three) times daily.  Jason Contreras lidocaine (LIDODERM) 5 % Place 1 patch onto the skin daily. Remove & Discard patch within 12 hours or as directed by MD  . lidocaine (XYLOCAINE) 5 % ointment Apply 1 application topically 3 (three) times daily as needed.  . metFORMIN (GLUCOPHAGE) 500 MG  tablet TAKE 2 TABLETS BY MOUTH TWO TIMES DAILY  . mometasone (ELOCON) 0.1 % cream APPLY  CREAM TOPICALLY ONCE DAILY  . tamsulosin (FLOMAX) 0.4 MG CAPS capsule TAKE 1 CAPSULE BY MOUTH  DAILY  . vitamin B-12 (CYANOCOBALAMIN) 1000 MCG tablet Take 1,000 mcg by mouth daily.   No facility-administered encounter medications on file as of 08/03/2019.     Objective:   Goals Addressed            This Visit's Progress   . RNCM: I am having a lot of pain especially in my left knee       Current Barriers:  Jason Contreras Knowledge Deficits related to pain control in left knee and other pain due to chronic pain/osteoarthritis . Impaired mobility due to pain in left knee- currently working with physical therapy and it is helping him with pain relief from arthritis   Nurse Case Manager Clinical Goal(s):  Jason Contreras Over the next 90 days, patient will verbalize understanding of plan for pain control for chronic pain  . Over the next 90 days, patient will work with pcp and CCM team to address needs related to best options for pain relief in patient with chronic pain issues . Over the next 90 days, patient will demonstrate a decrease in pain  exacerbations as evidenced by pain level being less than 6 on a consistent daily basis . Over the next 90 days, patient will attend  all scheduled medical appointments: with orthopedic provider and pcp . Over the next 90 days, patient will demonstrate understanding of rationale for each prescribed medication as evidenced by compliance and taking recommended dosages . Over the next 90 days, patient will work with CM team pharmacist to aide in answering questions and concerns about pain medications and safe administration . Over the next 90 days, the patient will demonstrate ongoing self health care management ability as evidenced by decreased pain level, no falls, and increased mobility  Interventions:  . Evaluation of current treatment plan related to unresolved pain due to arthritis worse  in the left knee and patient's adherence to plan as established by provider. Per the patient he is taking prednisone and leflunamide and his pain level is 99% better. . Advised patient to call the orthopedic provider for additional recommendations. Patient states the orthopedic doctor can not do anything else for his knee for 3 months- completed  . Reviewed medications with patient and discussed compliance, the patient verbalized he is currently taking prednisone and leflunamide and is having relief from the arthritis pain.  Jason Contreras Collaborated with pcp and CCM pharmacist  regarding pain control issues.  CCM pharmacist had a co-visit today via telephone with the patient daughter [name] who wanted to know if a Remicade infusion would help her father. The daughter reports that the patient can not even open a bottle now without pain.  She feels he is not getting any better with the current treatment regimen- completed  . Discussed plans with patient for ongoing care management follow up and provided patient with direct contact information for care management team . Reviewed scheduled/upcoming provider appointments including: 08-26-2019 with pcp, 08-16-2019 with Dr. Meda Coffee for blood work, 08-05-2019 with MD for possible prostate cancer,  and 08/25/2019 with Dr. Mikal Plane . Pharmacy referral for questions about pain medications and polypharmacy.  CCM pharmacist spoke today with the patient daughter on a co-visit call with the CM.  Will further collaborate with the pcp for recommendations. Completed  . Evaluation of patients current level of pain and the patient verbalized he is feeling better than he did when he was 79.  He has 12 more days of taking medications and he is a little concerned after he completes the medication cycle what will happen but he will follow up with the specialist for evaluation and further treatment options.  . Evaluation of PT process. The patient verbalized he is having PT 2 times a week and  doing well. The patient denies any pain at the time of the call.   Patient Self Care Activities:  . Patient verbalizes understanding of arthritis pain and the plan of care for controlling pain . Self administers medications as prescribed . Attends all scheduled provider appointments . Calls provider office for new concerns or questions . Unable to independently control pain due to arthritis without medication therapy and specialist intervention  . Unable to perform IADLs independently (currently having to use a cane when ambulating)- improvement in mobility, working with PT also   Please see past updates related to this goal by clicking on the "Past Updates" button in the selected goal      . RNCM: I try to take my bp and blood sugar readings daily       Current Barriers:  . Chronic Disease Management support, education, and care coordination needs related to HTN, HLD, and DMII  Clinical Goal(s) related to HTN, HLD, and DMII:  Over the next 90 days, patient  will:  . Work with the care management team to address educational, disease management, and care coordination needs  . Begin or continue self health monitoring activities as directed today Measure and record cbg (blood glucose) BID times daily, Measure and record blood pressure 7 times per week, and institue a hearth healthy/ADA diet . Call provider office for new or worsened signs and symptoms Blood glucose findings outside established parameters, Blood pressure findings outside established parameters, and New or worsened symptom related to HTN, HLD, and DM2 . Call care management team with questions or concerns . Verbalize basic understanding of patient centered plan of care established today  Interventions related to HTN, HLD, and DMII:  . Evaluation of current treatment plans and patient's adherence to plan as established by provider . Assessed patient understanding of disease states. The patient has a good understanding of disease  processes. The patient has pain under control but is also dealing with possible new diagnosis of prostate cancer- will see specialist on 08-05-2019 . Assessed patient's education and care coordination needs: education on the prednisone making blood sugars elevated.  The patient states he does not know how much longer he will be taking the prednisone  . Provided disease specific education to patient: education and support on dietary restrictions and evaluation of appetite. The patient states that he is maintaining his weight and he also is eating well. He does not eat as much as he use to eat.  Nash Dimmer with appropriate clinical care team members regarding patient needs  Patient Self Care Activities related to HTN, HLD, and DMII:  . Patient is unable to independently self-manage chronic health conditions  Please see past updates related to this goal by clicking on the "Past Updates" button in the selected goal          Plan:   The care management team will reach out to the patient again over the next 60 days.    Noreene Larsson RN, MSN, [facility] Mobile: [phone]

## 2019-08-04 NOTE — Therapy (Signed)
[facility] [address] [place], [address] Phone: [phone]   Fax:  [phone]  Physical Therapy Treatment  Patient Details  Name: Jason Contreras MRN: [id number] Date of Birth: 1940/05/12 No data recorded  Encounter Date: 08/03/2019  PT End of Session - 08/03/19 1620    Visit Number  7    Number of Visits  13    Date for PT Re-Evaluation  08/24/19    Authorization Type  7/10    PT Start Time  1600    PT Stop Time  1645    PT Time Calculation (min)  45 min    Equipment Utilized During Treatment  Gait belt    Activity Tolerance  Patient limited by pain    Behavior During Therapy  WFL for tasks assessed/performed       Past Medical History:  Diagnosis Date  . Adenocarcinoma, renal cell ([place])   . Arthritis   . Collagen vascular disease ([place])   . Diabetes mellitus without complication ([place])   . Elevated PSA   . Headache   . Hematuria   . Hyperlipidemia   . Hypertension   . Neuropathy   . Obesity   . PONV (postoperative nausea and vomiting)   . Renal insufficiency   . Right renal mass     Past Surgical History:  Procedure Laterality Date  . APPENDECTOMY    . CRYOABLATION  10/17/2017  . IR RADIOLOGIST EVAL & MGMT  07/17/2017  . IR RADIOLOGIST EVAL & MGMT  08/13/2017  . IR RADIOLOGIST EVAL & MGMT  11/12/2017  . IR RADIOLOGIST EVAL & MGMT  03/05/2018  . RADIOLOGY WITH ANESTHESIA Right 10/17/2017   Procedure: CT WITH ANESTHESIA RENAL CRYOABLATION;  Surgeon: [name], MD;  Location: [facility];  Service: Anesthesiology;  Laterality: Right;  . ROBOTIC ASSITED PARTIAL NEPHRECTOMY Right 12/12/2015   Procedure: ROBOTIC ASSITED PARTIAL NEPHRECTOMY;  Surgeon: [name], MD;  Location: [facility];  Service: Urology;  Laterality: Right;    There were no vitals filed for this visit.  Subjective Assessment - 08/03/19 1610    Subjective  Patient reports the knees are feeling better overall. Patient  states his back pain is around a 4/10 on the NPRS.    Pertinent History  Hx of renal cancer, Family Hx of bone cancer, sudden unexplained weight loss, decreased appetite, Decreased saddle sensation, and increasing spread of pain throughout body    Limitations  Lifting;Standing    How long can you stand comfortably?  5 minutes    How long can you walk comfortably?  can't walk w/o pain    Patient Stated Goals  get back to walking pain free    Currently in Pain?  No/denies    Pain Onset  More than a month ago    Pain Onset  More than a month ago           TREATMENT  Therapeutic Exercise Nustep with resistance level 6 - 4 min; level 5 - 2 min Squats with UE support - x 20 with UE support;  Running man with UE - x 10 B Side stepping with RTB around knees - x 15 Lumbar extensions in standing - 2 x 10 SLS RDL - x 20 Performed exercises to improve pain and spasms and decrease pain overall within the knee/back   PT Education - 08/03/19 1619    Education Details  form/technique with exercise    Person(s) Educated  Patient    Methods  Explanation;Demonstration    Comprehension  Verbalized understanding;Returned demonstration       PT Short Term Goals - 06/29/19 1512      PT SHORT TERM GOAL #1   Title  Patient will be independent in HEP in order to reduce number of visits    Baseline  --    Time  2    Period  Weeks    Status  New    Target Date  07/13/19        PT Long Term Goals - 06/29/19 1513      PT LONG TERM GOAL #1   Title  Patient will be able to obtain full knee extension range of motion to ambulate appropriately    Baseline  -20 degree knee extension    Time  6    Period  Weeks    Status  New    Target Date  08/24/19      PT LONG TERM GOAL #2   Title  Patient will have his worst knee pain reduced to 5/10 on NPRS in order to feel more comfortable walking around the house    Baseline  eval: 9/10    Time  6    Period  Weeks    Status  New    Target Date   08/10/19      PT LONG TERM GOAL #3   Title  Patient will show an ability to walk for 5 minutes without an increase in pain    Baseline  pain immediately    Time  6    Period  Weeks    Status  New    Target Date  08/10/19            Plan - 08/03/19 1635    Clinical Impression Statement  Able to progress exercises however patient with increased fatigue and dizziness with exercise. This is alleviated with sitting and resting for a coupel of minutes. Vitals stay WNL during the enitirity of the session with BP 131/82 when taken. Patient demonstrates increased pain along affected knee in performing exercises in single leg stance indicating during hip muscular control. Patient will benefit from further skilled therapy to return to prior level of function.    Personal Factors and Comorbidities  Age;Comorbidity 3+    Comorbidities  Diabetes, chronic pain, Hx CA    Examination-Activity Limitations  Bed Mobility;Locomotion Level;Transfers;Squat;Stairs;Sleep    Stability/Clinical Decision Making  Unstable/Unpredictable    Rehab Potential  Fair    PT Frequency  2x / week    PT Duration  8 weeks    PT Treatment/Interventions  ADLs/Self Care Home Management;Moist Heat;Traction;Biofeedback;Ultrasound;Cryotherapy;Electrical Stimulation;Gait training;Stair training;Functional mobility training;Therapeutic activities;Therapeutic exercise;Balance training;Neuromuscular re-education;Patient/family education;Manual techniques;Passive range of motion;Dry needling;Energy conservation;Taping;Spinal Manipulations;Joint Manipulations    PT Next Visit Plan  Address ROM impairments at the knee with STM and joint mobs    PT Home Exercise Plan  AROM on ball    Consulted and Agree with Plan of Care  Patient       Patient will benefit from skilled therapeutic intervention in order to improve the following deficits and impairments:  Abnormal gait, Decreased endurance, Decreased coordination, Decreased range of  motion, Difficulty walking, Decreased activity tolerance, Impaired UE functional use, Impaired vision/preception, Pain, Decreased balance, Hypomobility, Decreased mobility, Decreased strength  Visit Diagnosis: Chronic bilateral low back pain without sciatica  Pain in left hip  Chronic pain of left knee     Problem List Patient  Active Problem List   Diagnosis Date Noted  . Orthostatic hypotension 07/12/2019  . B12 deficiency 07/12/2019  . Low hemoglobin 07/12/2019  . CRP elevated 06/30/2019  . Pain syndrome, chronic 06/30/2019  . Primary osteoarthritis involving multiple joints 06/30/2019  . Vitamin D deficiency 06/30/2019  . DJD (degenerative joint disease), lumbosacral 11/04/2017  . Personal history of renal cancer 10/17/2017  . Advanced care planning/counseling discussion 01/13/2017  . Senile purpura ([place]) 09/26/2016  . Elevated PSA 01/07/2015  . BPH with obstruction/lower urinary tract symptoms 01/07/2015  . DM due to underlying condition with diabetic nephropathy ([place])   . Hypertension associated with diabetes ([place])   . Hyperlipidemia associated with type 2 diabetes mellitus ([place])     Blythe Stanford, PT DPT 08/04/2019, 11:37 AM  [facility] [address] [place], [address] Phone: [phone]   Fax:  [phone]  Name: Jason Contreras MRN: [id number] Date of Birth: 1941/04/20

## 2019-08-05 ENCOUNTER — Encounter: Payer: Self-pay | Admitting: Urology

## 2019-08-05 ENCOUNTER — Other Ambulatory Visit: Payer: Self-pay

## 2019-08-05 ENCOUNTER — Ambulatory Visit: Payer: Medicare Other | Admitting: Urology

## 2019-08-05 VITALS — BP 132/81 | HR 97 | Ht 68.0 in | Wt 207.1 lb

## 2019-08-05 DIAGNOSIS — R972 Elevated prostate specific antigen [PSA]: Secondary | ICD-10-CM

## 2019-08-05 DIAGNOSIS — N401 Enlarged prostate with lower urinary tract symptoms: Secondary | ICD-10-CM | POA: Diagnosis not present

## 2019-08-05 DIAGNOSIS — N138 Other obstructive and reflux uropathy: Secondary | ICD-10-CM | POA: Diagnosis not present

## 2019-08-05 LAB — BLADDER SCAN AMB NON-IMAGING: Scan Result: 139

## 2019-08-05 MED ORDER — DIAZEPAM 10 MG PO TABS: ORAL_TABLET | ORAL | 0 refills | Status: DC

## 2019-08-05 NOTE — Progress Notes (Signed)
08/05/19 11:01 AM   Jason Contreras 04/23/1941 [id number]  Referring provider: Venita Lick, NP [address] [place],   [address]  Chief Complaint  Patient presents with  . Follow-up    HPI: Jason Contreras is a 79 y.o. M with a history of RCC of the right kidney and elevated PSA returns today for 1 month f/u.   Elevated PSA  Patient was found to have a PSA of 4.5 ng/mL on 10/19/2013. A 4K score conducted on 12/15/2013 noted a 22% probability of having a Gleason's 7 or higher prostate cancer. PSA was 3.9 ng/mL on 12/29/2014, 5.8 on 06/26/15 and 6.0 on 06/30/15.  Repeat PSA on 09/29/15 5.6. He is found to have an enlarged prostate on DRE, but no nodules have been palpated.   He has a family history of PCa, with father having been diagnosed with PCa with metastatic diease in his 54s.   S/p negative biopsy on 10/09/15 which had 2 foci of HG PIN, otherwise no malignancy.  TRUS volume 41 cc.    Most recent PSA 13.3 on 07/06/19.   MRI of prostate on 07/23/19 indicates PI-RADS category 5 lesion of right anterior transition zone and righter anterior fibromuscular stroma at the prostate base and mid gland. Lesion measures 2.1 by 1.4 by 2.2 cm. Mild prostatomegaly.   He reports of doing well overall. He is hesitant to have another prostate biopsy and states it was the worse thing he has ever been through.   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    Imaging on 06/19/2017 shows no local recurrence of the bed of his previous renal mass on the rise.  There is however, a new lesion measuring 2.0 x 1.7 cm which in retrospect it was in fact present from a year ago previously measuring 1.4 x 1.2.  It is quite occult in nature.  It was negative for any evidence of metastatic disease.  Underwent cryoablation and bx of the lesion on 10/17/2017.  Pathology was positive for  clear  cell renal cell carcinoma, nuclear grade type II.  No recurrence seen on 02/2018 CT.    CT of the abdomen w &wo on 10/23/2018 noted no findings of recurrence along the right posterior mid kidney cryoablation site, or along the right anterior mid kidney partial nephrectomy site. No adenopathy or other worrisome findings. Other imaging findings of potential clinical significance: Aortic Atherosclerosis (ICD10-I70.0). Coronary atherosclerosis. Colonic diverticulosis. Multilevel lower lumbar impingement.  CT of abdomen w contrast on 07/20/19 indicated no evidence of recurrent or metastatic disease. Chest X-ray was also unremarkable.   BPH WITH LUTS  (prostate and/or bladder) IPSS score: N/A   Previous score: 14/3  Previous PVR: 139 mL   Major complaint(s):  Intermittent hesitancy. Incontinence if he cannot reach bathroom in time onset 1 month. He states symptoms of retaining fluid starting around same time he was prescribed Prednisone for his advanced arthritis. He also takes Flutamide in conjunction. He notes of weight loss from 236 to 207 Ib now. Reports of urge to void if he rolls over to his right sight when sleeping.   Nocturia 4-5x. He notes of going to bed at 11 pm and waking up at 8:30 am or when he feels like it. He denies drinking anything from 9 pm.  He reports of drinking caffeine (coke) in the morning otherwise he gets a headache. Also  reports of good water intake. He denies sleep apnea and associated symptoms of snoring. Denies use of alcohol.   He states he was unable to have an erection onset 3 months ago.   Currently taking: tamsulosin 0.4 mg daily   Denies any recent fevers, chills, nausea or vomiting.   PMH: Past Medical History:  Diagnosis Date  . Adenocarcinoma, renal cell ([place])   . Arthritis   . Collagen vascular disease ([place])   . Diabetes mellitus without complication ([place])   . Elevated PSA   . Headache   . Hematuria   . Hyperlipidemia   . Hypertension   .  Neuropathy   . Obesity   . PONV (postoperative nausea and vomiting)   . Renal insufficiency   . Right renal mass     Surgical History: Past Surgical History:  Procedure Laterality Date  . APPENDECTOMY    . CRYOABLATION  10/17/2017  . IR RADIOLOGIST EVAL & MGMT  07/17/2017  . IR RADIOLOGIST EVAL & MGMT  08/13/2017  . IR RADIOLOGIST EVAL & MGMT  11/12/2017  . IR RADIOLOGIST EVAL & MGMT  03/05/2018  . RADIOLOGY WITH ANESTHESIA Right 10/17/2017   Procedure: CT WITH ANESTHESIA RENAL CRYOABLATION;  Surgeon: [name], MD;  Location: [facility];  Service: Anesthesiology;  Laterality: Right;  . ROBOTIC ASSITED PARTIAL NEPHRECTOMY Right 12/12/2015   Procedure: ROBOTIC ASSITED PARTIAL NEPHRECTOMY;  Surgeon: [name], MD;  Location: [facility];  Service: Urology;  Laterality: Right;    Home Medications:  Allergies as of 08/05/2019      Reactions   Morphine Nausea And Vomiting   Morphine And Related Nausea And Vomiting      Medication List       Accurate as of August 05, 2019 11:01 AM. If you have any questions, ask your nurse or doctor.        acetaminophen 500 MG tablet Commonly known as: TYLENOL Take 1,000 mg by mouth every 6 (six) hours as needed.   atorvastatin 20 MG tablet Commonly known as: LIPITOR TAKE 1 TABLET BY MOUTH  DAILY AT 6 PM.   carvedilol 6.25 MG tablet Commonly known as: COREG TAKE 1 TABLET BY MOUTH TWO  TIMES DAILY   Cholecalciferol 1.25 MG (50000 UT) Tabs Take 1 tablet by mouth once a week. For 8 weeks and then stop.  Return to office for lab draw.   diazepam 10 MG tablet Commonly known as: Valium Take 30 minutes prior to biopsy Started by: Zara Council, PA-C   DULoxetine 60 MG capsule Commonly known as: CYMBALTA Take 1 capsule (60 mg total) by mouth daily.   gabapentin 600 MG tablet Commonly known as: NEURONTIN Take 1 tablet (600 mg total) by mouth 3 (three) times daily.   leflunomide 20 MG tablet Commonly known as: ARAVA Take 20 mg by mouth  daily.   lidocaine 5 % ointment Commonly known as: XYLOCAINE Apply 1 application topically 3 (three) times daily as needed.   lidocaine 5 % Commonly known as: LIDODERM Place 1 patch onto the skin daily. Remove & Discard patch within 12 hours or as directed by MD   metFORMIN 500 MG tablet Commonly known as: GLUCOPHAGE TAKE 2 TABLETS BY MOUTH TWO TIMES DAILY   mometasone 0.1 % cream Commonly known as: ELOCON APPLY  CREAM TOPICALLY ONCE DAILY   predniSONE 10 MG tablet Commonly known as: DELTASONE Taper - 5, 5, 4, 4, 3, 3, 2, 2, 1, 1, 1, 1   tamsulosin 0.4 MG Caps capsule  Commonly known as: FLOMAX TAKE 1 CAPSULE BY MOUTH  DAILY   vitamin B-12 1000 MCG tablet Commonly known as: CYANOCOBALAMIN Take 1,000 mcg by mouth daily.       Allergies:  Allergies  Allergen Reactions  . Morphine Nausea And Vomiting  . Morphine And Related Nausea And Vomiting    Family History: Family History  Problem Relation Age of Onset  . Bone cancer Mother   . Cancer Mother   . Cancer Father   . Stroke Brother   . Kidney disease Neg Hx     Social History:  reports that he has never smoked. He has never used smokeless tobacco. He reports that he does not drink alcohol or use drugs.   Physical Exam: BP 132/81   Pulse 97   Ht 5\' 8"  (1.727 m)   Wt 207 lb 1.6 oz (93.9 kg)   BMI 31.49 kg/m   Constitutional:  Alert and oriented, No acute distress. HEENT: [place] AT, moist mucus membranes.  Trachea midline, no masses. Mask in place.  Cardiovascular: No clubbing, cyanosis, or edema. Respiratory: Normal respiratory effort, no increased work of breathing. Skin: No rashes, bruises or suspicious lesions. Neurologic: Grossly intact, no focal deficits, moving all 4 extremities. Psychiatric: Normal mood and affect.  Laboratory Data:  Lab Results  Component Value Date   CREATININE 0.91 07/12/2019    Lab Results  Component Value Date   HGBA1C 5.8 05/28/2019   PSA history:      4.5 ng/dL on  10/19/2013.      4K score of 22% on 12/15/2013      3.9 ng/mL on 06/28/2014      3.9 ng/mL on 12/29/2014      5.8 ng/Dl on 06/26/15       6.0 ng/dL on 06/30/15       5.6 ng/dL on 09/29/15      5.1 on 07/2016      4.8 on 01/2017      6.8 in 07/2017      5.3 in 01/2018      5.9 in 10/2018      10.3 in 05/2019      13.3 on 07/06/19  I have reviewed the labs.  Pertinent Imaging:  CLINICAL DATA:  Follow-up renal cell cancer, weight loss  EXAM: CT ABDOMEN AND PELVIS WITH CONTRAST  TECHNIQUE: Multidetector CT imaging of the abdomen and pelvis was performed using the standard protocol following bolus administration of intravenous contrast.  CONTRAST:  119mL OMNIPAQUE IOHEXOL 300 MG/ML  SOLN  COMPARISON:  11/22/2018  FINDINGS: Lower chest: Lung bases are clear.  Hepatobiliary: Liver is within normal limits. No suspicious/enhancing hepatic lesions.  Gallbladder is unremarkable. No intrahepatic or extrahepatic ductal dilatation.  Pancreas: Within normal limits.  Spleen: Within normal limits.  Adrenals/Urinary Tract: Adrenal glands are within normal limits.  Status post partial nephrectomy in the anterior right lower kidney (series 2/image 40). Status post cryoablation in the posterior right lower kidney (series 2/image 40), without residual enhancement to suggest viable tumor. Left kidney is within limits. No hydronephrosis.  Bladder is within normal limits.  Stomach/Bowel: Stomach is within normal limits.  No evidence of obstruction.  Normal appendix (series 2/image 60).  Colonic diverticulosis, without evidence of diverticulitis.  Vascular/Lymphatic: No evidence of abdominal aortic aneurysm.  Atherosclerotic calcifications of the abdominal aorta and branch vessels.  No suspicious abdominopelvic lymphadenopathy.  Reproductive: Prostate is unremarkable.  Other: No abdominopelvic ascites.  Musculoskeletal: Degenerative changes of the  visualized thoracolumbar spine.  IMPRESSION: Status post right lower pole nephrectomy and cryoablation.  No evidence of recurrent or metastatic disease.  Additional stable ancillary findings as above.   Electronically Signed   By: Julian Hy M.D.   On: 07/20/2019 13:36  CLINICAL DATA:  Weight loss. History of renal cell carcinoma.  EXAM: CHEST - 2 VIEW  COMPARISON:  Chest x-ray dated 11/12/2018  FINDINGS: The heart size and pulmonary vascularity are normal and the lungs are clear. No infiltrates or effusions or findings of metastatic disease. Aortic atherosclerosis.  No acute bone abnormality. Slight reversal of the lower thoracic kyphosis.  IMPRESSION: No acute abnormalities.  Aortic Atherosclerosis (ICD10-I70.0).   Electronically Signed   By: Lorriane Shire M.D.   On: 07/20/2019 14:32  CLINICAL DATA:  Elevated PSA level. 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  EXAM: MR PROSTATE WITHOUT AND WITH CONTRAST  TECHNIQUE: Multiplanar multisequence MRI images were obtained of the pelvis centered about the prostate. Pre and post contrast images were obtained.  CONTRAST:  39mL GADAVIST GADOBUTROL 1 MMOL/ML IV SOLN  COMPARISON:  None.  FINDINGS: Prostate:  Region of interest # 1: PI-RADS category 5 lesion of the right anterior transition zone and anterior fibromuscular stroma at the prostate base and mid gland. This lesion has irregular indistinctly marginated low T2 signal and low ADC map activity with prominently increased diffusion weighted activity on high B value diffusion images as well as early enhancement. This lesion measures 2.60 cubic cm (2.1 by 1.4 by 2.2 cm). Representative images include image 10/11 showing high diffusion weighted activity; image 10/8 showing irregular low T2 signal; image 11/10 showing low ADC activity in this lesion, and image  12/23 showing early enhancement. Area of contact with the capsule is somewhat broad at about 2.0 cm.  Volume: 3D volumetric analysis: Prostate volume 46.48 cubic cm (5.3 by 4.2 by 4.4 cm).  Transcapsular spread: Not directly shown, but the broad area of contact of region of interest # 1 with the right anterior capsule increases risk of occult transcapsular spread.  Seminal vesicle involvement: Absent  Neurovascular bundle involvement: Absent  Pelvic adenopathy: Absent  Bone metastasis: Absent  Other findings: Sigmoid colon diverticulosis.  IMPRESSION: 1. PI-RADS category 5 lesion of the right anterior transition zone and right anterior fibromuscular stroma at the prostate base and mid gland. Lesion measures 2.1 by 1.4 by 2.2 cm. Targeting data sent to [facility]. 2. Although transcapsular spread is not directly shown, there is a broad (about 2.0 cm) area of contact of the above lesion with the capsule which can be associated with increased risk of occult transcapsular spread. 3. No adenopathy identified. 4. Mild prostatomegaly.   Electronically Signed   By: Van Clines M.D.   On: 07/23/2019 14:18  I have personally reviewed the images and agree with radiologist interpretation.    Assessment & Plan:    1. History of right RCC:     pT1a RCC s/p right robotic partial nephrectomy on 12/12/15 and s/p cryoablation and bx in 10/2017 No recurrence on 02/2018 CT  2. Elevated PSA:      History of elevated PSA up to 5.6 s/p negative biopsy 10/2015 (HgPIN) Most recent PSA 13.3 on 07/06/19 elevated from previous PSA of 10.3 on 05/28/2019 Discussed management of elevated PSA including fusion prostate biopsy vs prostate biopsy We discussed prostate biopsy in detail including the procedure itself, the risks of blood in the urine, stool, and ejaculate, serious infection, and  discomfort. He is willing to proceed with this as discussed. He elected prostate bx with Dr. Erlene Quan  given he developed a good patient physician relationship and does not want other care at this moment. He does not want to go to [place].  Rx of Valium 10 mg given to pt  Will return for prostate bx with Dr. Erlene Quan   3. BPH with LUTS Not adequately emptying bladder  Continue conservative management, avoiding bladder irritants and timed voiding's Continue tamsulosin 0.4 mg daily, not doubleing up on flomax as advised due to increased side effect risk  Will return for prostate bx with Dr. Erlene Quan   Return for prostate biopsy .  Zara Council, PA-C  [facility] [address] [place], [place] [address] [phone]  I, Lucas Mallow, am acting as a Education administrator for [employer],  I have reviewed the above documentation for accuracy and completeness, and I agree with the above.    Zara Council, PA-C

## 2019-08-05 NOTE — Patient Instructions (Signed)

## 2019-08-06 ENCOUNTER — Ambulatory Visit: Payer: Self-pay | Admitting: Urology

## 2019-08-10 ENCOUNTER — Other Ambulatory Visit: Payer: Self-pay

## 2019-08-10 ENCOUNTER — Ambulatory Visit: Payer: Medicare Other | Attending: Nurse Practitioner

## 2019-08-10 DIAGNOSIS — M545 Low back pain, unspecified: Secondary | ICD-10-CM

## 2019-08-10 DIAGNOSIS — M25562 Pain in left knee: Secondary | ICD-10-CM | POA: Diagnosis not present

## 2019-08-10 DIAGNOSIS — M25552 Pain in left hip: Secondary | ICD-10-CM | POA: Diagnosis not present

## 2019-08-10 DIAGNOSIS — M6281 Muscle weakness (generalized): Secondary | ICD-10-CM | POA: Diagnosis not present

## 2019-08-10 DIAGNOSIS — G8929 Other chronic pain: Secondary | ICD-10-CM | POA: Diagnosis not present

## 2019-08-10 NOTE — Therapy (Signed)
[facility] [address] [place], [address] Phone: [phone]   Fax:  [phone]  Physical Therapy Treatment  Patient Details  Name: Jason Contreras MRN: [id number] Date of Birth: [birth year] No data recorded  Encounter Date: 08/10/2019  PT End of Session - 08/10/19 0927    Visit Number  8    Number of Visits  13    Date for PT Re-Evaluation  08/24/19    Authorization Type  8/10    PT Start Time  0915    PT Stop Time  1000    PT Time Calculation (min)  45 min    Equipment Utilized During Treatment  Gait belt    Activity Tolerance  Patient limited by pain    Behavior During Therapy  WFL for tasks assessed/performed       Past Medical History:  Diagnosis Date  . Adenocarcinoma, renal cell ([place])   . Arthritis   . Collagen vascular disease ([place])   . Diabetes mellitus without complication ([place])   . Elevated PSA   . Headache   . Hematuria   . Hyperlipidemia   . Hypertension   . Neuropathy   . Obesity   . PONV (postoperative nausea and vomiting)   . Renal insufficiency   . Right renal mass     Past Surgical History:  Procedure Laterality Date  . APPENDECTOMY    . CRYOABLATION  10/17/2017  . IR RADIOLOGIST EVAL & MGMT  07/17/2017  . IR RADIOLOGIST EVAL & MGMT  08/13/2017  . IR RADIOLOGIST EVAL & MGMT  11/12/2017  . IR RADIOLOGIST EVAL & MGMT  03/05/2018  . RADIOLOGY WITH ANESTHESIA Right 10/17/2017   Procedure: CT WITH ANESTHESIA RENAL CRYOABLATION;  Surgeon: [name], MD;  Location: [facility];  Service: Anesthesiology;  Laterality: Right;  . ROBOTIC ASSITED PARTIAL NEPHRECTOMY Right 12/12/2015   Procedure: ROBOTIC ASSITED PARTIAL NEPHRECTOMY;  Surgeon: [name], MD;  Location: [facility];  Service: Urology;  Laterality: Right;    There were no vitals filed for this visit.  Subjective Assessment - 08/10/19 0920    Subjective  Patient states no major changes since the previous session.  Patient reports no increased pain since the previous session.    Pertinent History  Hx of renal cancer, Family Hx of bone cancer, sudden unexplained weight loss, decreased appetite, Decreased saddle sensation, and increasing spread of pain throughout body    Limitations  Lifting;Standing    How long can you stand comfortably?  5 minutes    How long can you walk comfortably?  can't walk w/o pain    Patient Stated Goals  get back to walking pain free    Currently in Pain?  No/denies    Pain Onset  More than a month ago    Pain Onset  More than a month ago       TREATMENT  Therapeutic Exercise Nustep with resistance level 5 - x 10 min Running man with UE - x 15 B Lumbar extension in standing with standing - x 15 Lumbar extension with OP from therapist in standing - x 15 Hip abduction/extension with RTB - x 20 hip circles cw/ccw Side stepping with RTB around ankles -  3 x 83ft B Wedding marches in standing with RTB around ankles - x 10   Performed exercises to improve pain and spasms and decrease pain overall within the knee/back   PT Education - 08/10/19 [id number]  Education Details  form/technique with exercise    Person(s) Educated  Patient    Methods  Explanation;Demonstration    Comprehension  Verbalized understanding;Returned demonstration       PT Short Term Goals - 06/29/19 1512      PT SHORT TERM GOAL #1   Title  Patient will be independent in HEP in order to reduce number of visits    Baseline  --    Time  2    Period  Weeks    Status  New    Target Date  07/13/19        PT Long Term Goals - 06/29/19 1513      PT LONG TERM GOAL #1   Title  Patient will be able to obtain full knee extension range of motion to ambulate appropriately    Baseline  -20 degree knee extension    Time  6    Period  Weeks    Status  New    Target Date  08/24/19      PT LONG TERM GOAL #2   Title  Patient will have his worst knee pain reduced to 5/10 on NPRS in order to feel more  comfortable walking around the house    Baseline  eval: 9/10    Time  6    Period  Weeks    Status  New    Target Date  08/10/19      PT LONG TERM GOAL #3   Title  Patient will show an ability to walk for 5 minutes without an increase in pain    Baseline  pain immediately    Time  6    Period  Weeks    Status  New    Target Date  08/10/19            Plan - 08/10/19 [id number]    Clinical Impression Statement  Patient demonstrates increased low back pain during hip exercises, this is improved after performing lumbar extensions and educated patient to perform this at home. Patient demonstrates poor standing balance and coordination with performing exercises in the lateral and rotational planes of motion. Patient will benefit from furhter skilled threapy focused on strengthening to return to prior level of function.    Personal Factors and Comorbidities  Age;Comorbidity 3+    Comorbidities  Diabetes, chronic pain, Hx CA    Examination-Activity Limitations  Bed Mobility;Locomotion Level;Transfers;Squat;Stairs;Sleep    Stability/Clinical Decision Making  Unstable/Unpredictable    Rehab Potential  Fair    PT Frequency  2x / week    PT Duration  8 weeks    PT Treatment/Interventions  ADLs/Self Care Home Management;Moist Heat;Traction;Biofeedback;Ultrasound;Cryotherapy;Electrical Stimulation;Gait training;Stair training;Functional mobility training;Therapeutic activities;Therapeutic exercise;Balance training;Neuromuscular re-education;Patient/family education;Manual techniques;Passive range of motion;Dry needling;Energy conservation;Taping;Spinal Manipulations;Joint Manipulations    PT Next Visit Plan  Address ROM impairments at the knee with STM and joint mobs    PT Home Exercise Plan  AROM on ball    Consulted and Agree with Plan of Care  Patient       Patient will benefit from skilled therapeutic intervention in order to improve the following deficits and impairments:  Abnormal gait,  Decreased endurance, Decreased coordination, Decreased range of motion, Difficulty walking, Decreased activity tolerance, Impaired UE functional use, Impaired vision/preception, Pain, Decreased balance, Hypomobility, Decreased mobility, Decreased strength  Visit Diagnosis: Chronic bilateral low back pain without sciatica  Pain in left hip  Chronic pain of left knee     Problem List Patient Active  Problem List   Diagnosis Date Noted  . Orthostatic hypotension 07/12/2019  . B12 deficiency 07/12/2019  . Low hemoglobin 07/12/2019  . CRP elevated 06/30/2019  . Pain syndrome, chronic 06/30/2019  . Primary osteoarthritis involving multiple joints 06/30/2019  . Vitamin D deficiency 06/30/2019  . DJD (degenerative joint disease), lumbosacral 11/04/2017  . Personal history of renal cancer 10/17/2017  . Advanced care planning/counseling discussion 01/13/2017  . Senile purpura ([place]) 09/26/2016  . Elevated PSA 01/07/2015  . BPH with obstruction/lower urinary tract symptoms 01/07/2015  . DM due to underlying condition with diabetic nephropathy ([place])   . Hypertension associated with diabetes ([place])   . Hyperlipidemia associated with type 2 diabetes mellitus ([place])     Blythe Stanford, PT DPT 08/10/2019, 10:07 AM  [facility] [address] [place], [address] Phone: [phone]   Fax:  [phone]  Name: Jason Contreras MRN: [id number] Date of Birth: [birth year]

## 2019-08-11 ENCOUNTER — Ambulatory Visit (INDEPENDENT_AMBULATORY_CARE_PROVIDER_SITE_OTHER): Payer: Medicare Other | Admitting: Nurse Practitioner

## 2019-08-11 ENCOUNTER — Encounter: Payer: Self-pay | Admitting: Nurse Practitioner

## 2019-08-11 DIAGNOSIS — I10 Essential (primary) hypertension: Secondary | ICD-10-CM | POA: Diagnosis not present

## 2019-08-11 DIAGNOSIS — E1159 Type 2 diabetes mellitus with other circulatory complications: Secondary | ICD-10-CM | POA: Diagnosis not present

## 2019-08-11 DIAGNOSIS — I951 Orthostatic hypotension: Secondary | ICD-10-CM

## 2019-08-11 NOTE — Progress Notes (Signed)
BP 133/75 (BP Location: Left Arm, Cuff Size: Normal)   Pulse 94   Temp 98.5 F (36.9 C) (Oral)   Wt 208 lb 6.4 oz (94.5 kg)   SpO2 97%   BMI 31.69 kg/m    Subjective:    Patient ID: Jason Contreras, male    DOB: 1941-02-22, 79 y.o.   MRN: [id number]  HPI: Jason Contreras is a 79 y.o. male  Chief Complaint  Patient presents with  . Hypotension    4 week f/up   HYPERTENSION Currently is taking Coreg 6.25 MG BID for BP, last visit reported orthostatic hypotension and Losartan was discontinued + recommended he wear compression hose and ensure plenty of water daily.  At this time reports less dizziness and is able to perform tasks at PT, bending and changing position without issue. Hypertension status: controlled  Satisfied with current treatment? yes Duration of hypertension: chronic BP monitoring frequency:  daily BP range: 130/70's at home BP medication side effects:  no Medication compliance: good compliance Previous BP meds: Coreg and Losartan Aspirin: no Recurrent headaches: no Visual changes: no Palpitations: no Dyspnea: no Chest pain: no Lower extremity edema: no Dizzy/lightheaded: no  Relevant past medical, surgical, family and social history reviewed and updated as indicated. Interim medical history since our last visit reviewed. Allergies and medications reviewed and updated.  Review of Systems  Constitutional: Negative for activity change, diaphoresis, fatigue and fever.  Respiratory: Negative for cough, chest tightness, shortness of breath and wheezing.   Cardiovascular: Negative for chest pain, palpitations and leg swelling.  Gastrointestinal: Negative.   Neurological: Negative.   Psychiatric/Behavioral: Negative.     Per HPI unless specifically indicated above     Objective:    BP 133/75 (BP Location: Left Arm, Cuff Size: Normal)   Pulse 94   Temp 98.5 F (36.9 C) (Oral)   Wt 208 lb 6.4 oz (94.5 kg)   SpO2 97%   BMI 31.69 kg/m   Wt Readings  from Last 3 Encounters:  08/11/19 208 lb 6.4 oz (94.5 kg)  08/05/19 207 lb 1.6 oz (93.9 kg)  07/12/19 208 lb (94.3 kg)    Physical Exam Vitals and nursing note reviewed.  Constitutional:      General: He is awake.     Appearance: He is well-developed and overweight.  HENT:     Head: Normocephalic and atraumatic.     Right Ear: Hearing normal. No drainage.     Left Ear: Hearing normal. No drainage.  Eyes:     General: Lids are normal.        Right eye: No discharge.        Left eye: No discharge.     Conjunctiva/sclera: Conjunctivae normal.     Pupils: Pupils are equal, round, and reactive to light.  Neck:     Thyroid: No thyromegaly.     Vascular: No carotid bruit or JVD.     Trachea: Trachea normal.  Cardiovascular:     Rate and Rhythm: Normal rate and regular rhythm.     Heart sounds: Normal heart sounds, S1 normal and S2 normal. No murmur. No gallop.   Pulmonary:     Effort: Pulmonary effort is normal. No accessory muscle usage or respiratory distress.     Breath sounds: Normal breath sounds.  Abdominal:     General: Bowel sounds are normal.     Palpations: Abdomen is soft.  Musculoskeletal:        General: Normal range of motion.  Cervical back: Normal range of motion and neck supple.     Right lower leg: No edema.     Left lower leg: No edema.  Skin:    General: Skin is warm and dry.     Capillary Refill: Capillary refill takes less than 2 seconds.  Neurological:     Mental Status: He is alert and oriented to person, place, and time.  Psychiatric:        Attention and Perception: Attention normal.        Mood and Affect: Mood normal.        Speech: Speech normal.        Behavior: Behavior normal. Behavior is cooperative.        Thought Content: Thought content normal.     Results for orders placed or performed in visit on 08/05/19  Bladder Scan (Post Void Residual) in office  Result Value Ref Range   Scan Result 139       Assessment & Plan:    Problem List Items Addressed This Visit      Cardiovascular and Mediastinum   Hypertension associated with diabetes ([place])    Chronic, stable with BP at goal today in office and at home.  Recommend he monitor BP at home at least three mornings a week.  Continue current medication regimen and adjust as needed.  Return in 2 months.      Orthostatic hypotension    Improved with discontinuation of Losartan and decrease in pain level.  Will continue current HTN regimen and if elevations in BP start to present will restart Losartan at 25 MG and monitor closely. He denies any further dizziness, including at PT.  Plan for follow-up in 2 months for chronic disease visit.  Continue to monitor closely.          Follow up plan: Return in about 2 months (around 10/11/2019) for T2DM, HTN/HLD, Chronic pain, Vit D def, B12.

## 2019-08-11 NOTE — Assessment & Plan Note (Signed)
Chronic, stable with BP at goal today in office and at home.  Recommend he monitor BP at home at least three mornings a week.  Continue current medication regimen and adjust as needed.  Return in 2 months.

## 2019-08-11 NOTE — Assessment & Plan Note (Signed)
Improved with discontinuation of Losartan and decrease in pain level.  Will continue current HTN regimen and if elevations in BP start to present will restart Losartan at 25 MG and monitor closely. He denies any further dizziness, including at PT.  Plan for follow-up in 2 months for chronic disease visit.  Continue to monitor closely.

## 2019-08-11 NOTE — Patient Instructions (Signed)
Hypotension As your heart beats, it forces blood through your body. This force is called blood pressure. If you have hypotension, you have low blood pressure. When your blood pressure is too low, you may not get enough blood to your brain or other parts of your body. This may cause you to feel weak, light-headed, have a fast heartbeat, or even pass out (faint). Low blood pressure may be harmless, or it may cause serious problems. What are the causes?  Blood loss.  Not enough water in the body (dehydration).  Heart problems.  Hormone problems.  Pregnancy.  A very bad infection.  Not having enough of certain nutrients.  Very bad allergic reactions.  Certain medicines. What increases the risk?  Age. The risk increases as you get older.  Conditions that affect the heart or the brain and spinal cord (central nervous system).  Taking certain medicines.  Being pregnant. What are the signs or symptoms?  Feeling: ? Weak. ? Light-headed. ? Dizzy. ? Tired (fatigued).  Blurred vision.  Fast heartbeat.  Passing out, in very bad cases. How is this treated?  Changing your diet. This may involve eating more salt (sodium) or drinking more water.  Taking medicines to raise your blood pressure.  Changing how much you take (the dosage) of some of your medicines.  Wearing compression stockings. These stockings help to prevent blood clots and reduce swelling in your legs. In some cases, you may need to go to the hospital for:  Fluid replacement. This means you will receive fluids through an IV tube.  Blood replacement. This means you will receive donated blood through an IV tube (transfusion).  Treating an infection or heart problems, if this applies.  Monitoring. You may need to be monitored while medicines that you are taking wear off. Follow these instructions at home: Eating and drinking   Drink enough fluids to keep your pee (urine) pale yellow.  Eat a healthy diet.  Follow instructions from your doctor about what you can eat or drink. A healthy diet includes: ? Fresh fruits and vegetables. ? Whole grains. ? Low-fat (lean) meats. ? Low-fat dairy products.  Eat extra salt only as told. Do not add extra salt to your diet unless your doctor tells you to.  Eat small meals often.  Avoid standing up quickly after you eat. Medicines  Take over-the-counter and prescription medicines only as told by your doctor. ? Follow instructions from your doctor about changing how much you take of your medicines, if this applies. ? Do not stop or change any of your medicines on your own. General instructions   Wear compression stockings as told by your doctor.  Get up slowly from lying down or sitting.  Avoid hot showers and a lot of heat as told by your doctor.  Return to your normal activities as told by your doctor. Ask what activities are safe for you.  Do not use any products that contain nicotine or tobacco, such as cigarettes, e-cigarettes, and chewing tobacco. If you need help quitting, ask your doctor.  Keep all follow-up visits as told by your doctor. This is important. Contact a doctor if:  You throw up (vomit).  You have watery poop (diarrhea).  You have a fever for more than 2-3 days.  You feel more thirsty than normal.  You feel weak and tired. Get help right away if:  You have chest pain.  You have a fast or uneven heartbeat.  You lose feeling (have numbness) in any   part of your body.  You cannot move your arms or your legs.  You have trouble talking.  You get sweaty or feel light-headed.  You pass out.  You have trouble breathing.  You have trouble staying awake.  You feel mixed up (confused). Summary  Hypotension is also called low blood pressure. It is when the force of blood pumping through your arteries is too weak.  Hypotension may be harmless, or it may cause serious problems.  Treatment may include changing  your diet and medicines, and wearing compression stockings.  In very bad cases, you may need to go to the hospital. This information is not intended to replace advice given to you by your health care provider. Make sure you discuss any questions you have with your health care provider. Document Revised: 10/16/2017 Document Reviewed: 10/16/2017 Elsevier Patient Education  2020 [employer]  

## 2019-08-12 ENCOUNTER — Ambulatory Visit: Payer: Medicare Other

## 2019-08-12 ENCOUNTER — Other Ambulatory Visit: Payer: Self-pay

## 2019-08-12 DIAGNOSIS — M6281 Muscle weakness (generalized): Secondary | ICD-10-CM | POA: Diagnosis not present

## 2019-08-12 DIAGNOSIS — M25552 Pain in left hip: Secondary | ICD-10-CM

## 2019-08-12 DIAGNOSIS — G8929 Other chronic pain: Secondary | ICD-10-CM

## 2019-08-12 DIAGNOSIS — M25562 Pain in left knee: Secondary | ICD-10-CM

## 2019-08-12 DIAGNOSIS — M545 Low back pain, unspecified: Secondary | ICD-10-CM

## 2019-08-12 NOTE — Therapy (Signed)
[facility] [address], [place], [address] Phone: [phone]   Fax:  [phone]  Physical Therapy Treatment  Patient Details  Name: Jason Contreras MRN: [id number] Date of Birth: 02/08/1941 No data recorded  Encounter Date: 08/12/2019  PT End of Session - 08/12/19 1733    Visit Number  9    Number of Visits  13    Date for PT Re-Evaluation  08/24/19    Authorization Type  9/10    PT Start Time  1515    PT Stop Time  1600    PT Time Calculation (min)  45 min    Equipment Utilized During Treatment  Gait belt    Activity Tolerance  Patient limited by pain    Behavior During Therapy  WFL for tasks assessed/performed       Past Medical History:  Diagnosis Date  . Adenocarcinoma, renal cell ([place])   . Arthritis   . Collagen vascular disease ([place])   . Diabetes mellitus without complication ([place])   . Elevated PSA   . Headache   . Hematuria   . Hyperlipidemia   . Hypertension   . Neuropathy   . Obesity   . PONV (postoperative nausea and vomiting)   . Renal insufficiency   . Right renal mass     Past Surgical History:  Procedure Laterality Date  . APPENDECTOMY    . CRYOABLATION  10/17/2017  . IR RADIOLOGIST EVAL & MGMT  07/17/2017  . IR RADIOLOGIST EVAL & MGMT  08/13/2017  . IR RADIOLOGIST EVAL & MGMT  11/12/2017  . IR RADIOLOGIST EVAL & MGMT  03/05/2018  . RADIOLOGY WITH ANESTHESIA Right 10/17/2017   Procedure: CT WITH ANESTHESIA RENAL CRYOABLATION;  Surgeon: [name], MD;  Location: [facility];  Service: Anesthesiology;  Laterality: Right;  . ROBOTIC ASSITED PARTIAL NEPHRECTOMY Right 12/12/2015   Procedure: ROBOTIC ASSITED PARTIAL NEPHRECTOMY;  Surgeon: [name], MD;  Location: [facility];  Service: Urology;  Laterality: Right;    There were no vitals filed for this visit.  Subjective Assessment - 08/12/19 1730    Subjective  Patient reports increased right hip pain today and states he's  been feeling tired today.    Pertinent History  Hx of renal cancer, Family Hx of bone cancer, sudden unexplained weight loss, decreased appetite, Decreased saddle sensation, and increasing spread of pain throughout body    Limitations  Lifting;Standing    How long can you stand comfortably?  5 minutes    How long can you walk comfortably?  can't walk w/o pain    Patient Stated Goals  get back to walking pain free    Currently in Pain?  Yes    Pain Score  5     Pain Location  Back    Pain Orientation  Left    Pain Descriptors / Indicators  Aching    Pain Type  Chronic pain    Pain Onset  More than a month ago    Pain Frequency  Intermittent    Pain Onset  More than a month ago        TREATMENT  Therapeutic Exercise  Nustep with resistance level 6 - x 2 min; level 5 - x 4 min  Hip circles with GTB with UE support - x 20  Hip flexor stretch - x 10 10sec hold  Lumbar extension in standing with UE support - x 10  Single Hip RDL - x10  Wedding marches in standing with GTB around ankles - x 10  TKE in standing with BTB and GTB - x 30     Performed exercises to improve pain and spasms and decrease pain overall within the knee/back       PT Education - 08/12/19 1733    Education Details  form/technique with exercise    Person(s) Educated  Patient    Methods  Explanation;Demonstration    Comprehension  Verbalized understanding;Returned demonstration       PT Short Term Goals - 06/29/19 1512      PT SHORT TERM GOAL #1   Title  Patient will be independent in HEP in order to reduce number of visits    Baseline  --    Time  2    Period  Weeks    Status  New    Target Date  07/13/19        PT Long Term Goals - 06/29/19 1513      PT LONG TERM GOAL #1   Title  Patient will be able to obtain full knee extension range of motion to ambulate appropriately    Baseline  -20 degree knee extension    Time  6    Period  Weeks    Status  New    Target Date  08/24/19       PT LONG TERM GOAL #2   Title  Patient will have his worst knee pain reduced to 5/10 on NPRS in order to feel more comfortable walking around the house    Baseline  eval: 9/10    Time  6    Period  Weeks    Status  New    Target Date  08/10/19      PT LONG TERM GOAL #3   Title  Patient will show an ability to walk for 5 minutes without an increase in pain    Baseline  pain immediately    Time  6    Period  Weeks    Status  New    Target Date  08/10/19            Plan - 08/12/19 1749    Clinical Impression Statement  Patient demonstrates increased hip pain at baseline which limited ability to perform higher level exercises. Patient demonstrates increased difficulty with performing exercises while maintaining lumbar extension requiring frequent verbal cueing to maintain. Patient continues to have difficulty with this, however, is able to perform more strenous exercises targeting the quadriceps (TKE with BTB and GTB simultaneously). Patient will benefit from further skilled therapy to return to prior level of function.    Personal Factors and Comorbidities  Age;Comorbidity 3+    Comorbidities  Diabetes, chronic pain, Hx CA    Examination-Activity Limitations  Bed Mobility;Locomotion Level;Transfers;Squat;Stairs;Sleep    Stability/Clinical Decision Making  Unstable/Unpredictable    Rehab Potential  Fair    PT Frequency  2x / week    PT Duration  8 weeks    PT Treatment/Interventions  ADLs/Self Care Home Management;Moist Heat;Traction;Biofeedback;Ultrasound;Cryotherapy;Electrical Stimulation;Gait training;Stair training;Functional mobility training;Therapeutic activities;Therapeutic exercise;Balance training;Neuromuscular re-education;Patient/family education;Manual techniques;Passive range of motion;Dry needling;Energy conservation;Taping;Spinal Manipulations;Joint Manipulations    PT Next Visit Plan  Address ROM impairments at the knee with STM and joint mobs    PT Home Exercise Plan   AROM on ball    Consulted and Agree with Plan of Care  Patient       Patient will benefit from skilled therapeutic intervention in order to  improve the following deficits and impairments:  Abnormal gait, Decreased endurance, Decreased coordination, Decreased range of motion, Difficulty walking, Decreased activity tolerance, Impaired UE functional use, Impaired vision/preception, Pain, Decreased balance, Hypomobility, Decreased mobility, Decreased strength  Visit Diagnosis: Chronic bilateral low back pain without sciatica  Pain in left hip  Chronic pain of left knee     Problem List Patient Active Problem List   Diagnosis Date Noted  . Seronegative arthritis 07/15/2019  . Orthostatic hypotension 07/12/2019  . B12 deficiency 07/12/2019  . Low hemoglobin 07/12/2019  . Pain syndrome, chronic 06/30/2019  . Primary osteoarthritis involving multiple joints 06/30/2019  . Vitamin D deficiency 06/30/2019  . DJD (degenerative joint disease), lumbosacral 11/04/2017  . Personal history of renal cancer 10/17/2017  . Advanced care planning/counseling discussion 01/13/2017  . Senile purpura ([place]) 09/26/2016  . Elevated PSA 01/07/2015  . BPH with obstruction/lower urinary tract symptoms 01/07/2015  . DM due to underlying condition with diabetic nephropathy ([place])   . Hypertension associated with diabetes ([place])   . Hyperlipidemia associated with type 2 diabetes mellitus ([place])     Blythe Stanford, PT DPT 08/12/2019, 11:41 PM  [facility] [address], [place], [address] Phone: [phone]   Fax:  [phone]  Name: Jason Contreras MRN: [id number] Date of Birth: 1940/09/19

## 2019-08-16 DIAGNOSIS — M1380 Other specified arthritis, unspecified site: Secondary | ICD-10-CM | POA: Diagnosis not present

## 2019-08-16 DIAGNOSIS — Z79899 Other long term (current) drug therapy: Secondary | ICD-10-CM | POA: Diagnosis not present

## 2019-08-17 ENCOUNTER — Ambulatory Visit: Payer: Medicare Other

## 2019-08-17 ENCOUNTER — Other Ambulatory Visit: Payer: Self-pay

## 2019-08-17 DIAGNOSIS — M25562 Pain in left knee: Secondary | ICD-10-CM | POA: Diagnosis not present

## 2019-08-17 DIAGNOSIS — M545 Low back pain: Secondary | ICD-10-CM | POA: Diagnosis not present

## 2019-08-17 DIAGNOSIS — M6281 Muscle weakness (generalized): Secondary | ICD-10-CM | POA: Diagnosis not present

## 2019-08-17 DIAGNOSIS — G8929 Other chronic pain: Secondary | ICD-10-CM | POA: Diagnosis not present

## 2019-08-17 DIAGNOSIS — M25552 Pain in left hip: Secondary | ICD-10-CM | POA: Diagnosis not present

## 2019-08-17 NOTE — Therapy (Addendum)
[facility] [address] [place], [address] Phone: [phone]   Fax:  [phone]  Physical Therapy Treatment/ Progress Note  Patient Details  Name: Jason Contreras MRN: [id number] Date of Birth: [birth year] No data recorded  Reporting Period: 06/29/2019 - 08/17/2019  Encounter Date: 08/17/2019  PT End of Session - 08/17/19 0952    Visit Number  10    Number of Visits  13    Date for PT Re-Evaluation  08/24/19    Authorization Type  10/10    PT Start Time  0945    PT Stop Time  1030    PT Time Calculation (min)  45 min    Equipment Utilized During Treatment  Gait belt    Activity Tolerance  Patient limited by pain    Behavior During Therapy  WFL for tasks assessed/performed       Past Medical History:  Diagnosis Date  . Adenocarcinoma, renal cell ([place])   . Arthritis   . Collagen vascular disease ([place])   . Diabetes mellitus without complication ([place])   . Elevated PSA   . Headache   . Hematuria   . Hyperlipidemia   . Hypertension   . Neuropathy   . Obesity   . PONV (postoperative nausea and vomiting)   . Renal insufficiency   . Right renal mass     Past Surgical History:  Procedure Laterality Date  . APPENDECTOMY    . CRYOABLATION  10/17/2017  . IR RADIOLOGIST EVAL & MGMT  07/17/2017  . IR RADIOLOGIST EVAL & MGMT  08/13/2017  . IR RADIOLOGIST EVAL & MGMT  11/12/2017  . IR RADIOLOGIST EVAL & MGMT  03/05/2018  . RADIOLOGY WITH ANESTHESIA Right 10/17/2017   Procedure: CT WITH ANESTHESIA RENAL CRYOABLATION;  Surgeon: [name], MD;  [facility];  Service: Anesthesiology;  Laterality: Right;  . ROBOTIC ASSITED PARTIAL NEPHRECTOMY Right 12/12/2015   Procedure: ROBOTIC ASSITED PARTIAL NEPHRECTOMY;  Surgeon: [name], MD;  Location: [facility];  Service: Urology;  Laterality: Right;    There were no vitals filed for this visit.  Subjective Assessment - 08/17/19 0948    Subjective   Patient states he's been feeling "sickly" he states he has not been taken prednisone. Patient reports a 3/10 pain in the back.    Pertinent History  Hx of renal cancer, Family Hx of bone cancer, sudden unexplained weight loss, decreased appetite, Decreased saddle sensation, and increasing spread of pain throughout body    Limitations  Lifting;Standing    How long can you stand comfortably?  5 minutes    How long can you walk comfortably?  can't walk w/o pain    Patient Stated Goals  get back to walking pain free    Currently in Pain?  Yes    Pain Score  3     Pain Location  Back    Pain Orientation  Left    Pain Descriptors / Indicators  Aching    Pain Type  Chronic pain    Pain Onset  More than a month ago    Pain Frequency  Intermittent    Pain Onset  More than a month ago       TREATMENT Therapeutic Exercise Hip abduction in sidelying - 2 x 10  Hip ER in sidelying - 2 x 10 Knee flexion/extension - x 40 with weighted ball Ambulation for endurance -1582ft with focus on improving posture and improving stepping quality Prone  press ups - x 10 Hip extension with straight leg  - 2 x 10 Hip extension with bent knee - 2 x 10  Performed exercises to address hip weakness and pain       PT Education - 08/17/19 0951    Education Details  form/technique with exercise    Person(s) Educated  Patient    Methods  Explanation;Demonstration    Comprehension  Verbalized understanding;Returned demonstration       PT Short Term Goals - 08/17/19 0952      PT SHORT TERM GOAL #1   Title  Patient will be independent in HEP in order to reduce number of visits    Baseline  independnent with HEP    Time  2    Period  Weeks    Status  Achieved    Target Date  07/13/19        PT Long Term Goals - 08/17/19 1000      PT LONG TERM GOAL #1   Title  Patient will be able to obtain full knee extension range of motion to ambulate appropriately    Baseline  -20 degree knee extension; 08/17/2019:  -5 degree (equal to the unaffected)    Time  6    Period  Weeks    Status  On-going      PT LONG TERM GOAL #2   Title  Patient will have his worst knee pain reduced to 5/10 on NPRS in order to feel more comfortable walking around the house    Baseline  eval: 9/10; 08/17/2019: 0/10    Time  6    Period  Weeks    Status  Achieved      PT LONG TERM GOAL #3   Title  Patient will show an ability to walk for 5 minutes without an increase in pain    Baseline  pain immediately; 08/17/2019: 2 minutes    Time  6    Period  Weeks    Status  On-going      PT LONG TERM GOAL #4   Title  Patient will have a worst low back and hip pain of 2/10 to to indicate ability to perform greater walking and bending with less overall pain.    Baseline  6/10    Time  6    Period  Weeks    Status  New            Plan - 08/17/19 1015    Clinical Impression Statement  Patient is making progress toward long term goals with significant improvement in lower extremity functioning with ability to walk for longer periods of time, improvement in overall pain, and with ROM of the affected knee. Patient however continues to have increased pain along his hip and lower back. Patient demosntrates l\poor standing posture most likely contributing to incresed pain. Patient also has dereased hip strength most notably along the hip extensors and abductors. Patient will benefit form furhter skilled therapy to return to prior level of function.    Personal Factors and Comorbidities  Age;Comorbidity 3+    Comorbidities  Diabetes, chronic pain, Hx CA    Examination-Activity Limitations  Bed Mobility;Locomotion Level;Transfers;Squat;Stairs;Sleep    Stability/Clinical Decision Making  Unstable/Unpredictable    Rehab Potential  Fair    PT Frequency  2x / week    PT Duration  8 weeks    PT Treatment/Interventions  ADLs/Self Care Home Management;Moist Heat;Traction;Biofeedback;Ultrasound;Cryotherapy;Electrical Stimulation;Gait  training;Stair training;Functional mobility training;Therapeutic activities;Therapeutic exercise;Balance training;Neuromuscular re-education;Patient/family  education;Manual techniques;Passive range of motion;Dry needling;Energy conservation;Taping;Spinal Manipulations;Joint Manipulations    PT Next Visit Plan  Address ROM impairments at the knee with STM and joint mobs    PT Home Exercise Plan  AROM on ball    Consulted and Agree with Plan of Care  Patient       Patient will benefit from skilled therapeutic intervention in order to improve the following deficits and impairments:  Abnormal gait, Decreased endurance, Decreased coordination, Decreased range of motion, Difficulty walking, Decreased activity tolerance, Impaired UE functional use, Impaired vision/preception, Pain, Decreased balance, Hypomobility, Decreased mobility, Decreased strength  Visit Diagnosis: Chronic bilateral low back pain without sciatica  Pain in left hip  Chronic pain of left knee     Problem List Patient Active Problem List   Diagnosis Date Noted  . Seronegative arthritis 07/15/2019  . Orthostatic hypotension 07/12/2019  . B12 deficiency 07/12/2019  . Low hemoglobin 07/12/2019  . Pain syndrome, chronic 06/30/2019  . Primary osteoarthritis involving multiple joints 06/30/2019  . Vitamin D deficiency 06/30/2019  . DJD (degenerative joint disease), lumbosacral 11/04/2017  . Personal history of renal cancer 10/17/2017  . Advanced care planning/counseling discussion 01/13/2017  . Senile purpura ([place]) 09/26/2016  . Elevated PSA 01/07/2015  . BPH with obstruction/lower urinary tract symptoms 01/07/2015  . DM due to underlying condition with diabetic nephropathy ([place])   . Hypertension associated with diabetes ([place])   . Hyperlipidemia associated with type 2 diabetes mellitus ([place])     Blythe Stanford, PT DPT 08/17/2019, 10:38 AM  [facility] [address] [place], [address] Phone: [phone]   Fax:  [phone]  Name: LENOARD BATON MRN: [id number] Date of Birth: 07-24-1940

## 2019-08-18 ENCOUNTER — Other Ambulatory Visit: Payer: Self-pay

## 2019-08-18 ENCOUNTER — Ambulatory Visit: Payer: Medicare Other

## 2019-08-18 ENCOUNTER — Ambulatory Visit (INDEPENDENT_AMBULATORY_CARE_PROVIDER_SITE_OTHER): Payer: Medicare Other | Admitting: Pharmacist

## 2019-08-18 DIAGNOSIS — M8949 Other hypertrophic osteoarthropathy, multiple sites: Secondary | ICD-10-CM | POA: Diagnosis not present

## 2019-08-18 DIAGNOSIS — E0821 Diabetes mellitus due to underlying condition with diabetic nephropathy: Secondary | ICD-10-CM | POA: Diagnosis not present

## 2019-08-18 DIAGNOSIS — M25552 Pain in left hip: Secondary | ICD-10-CM | POA: Diagnosis not present

## 2019-08-18 DIAGNOSIS — M545 Low back pain: Secondary | ICD-10-CM | POA: Diagnosis not present

## 2019-08-18 DIAGNOSIS — E1159 Type 2 diabetes mellitus with other circulatory complications: Secondary | ICD-10-CM

## 2019-08-18 DIAGNOSIS — N138 Other obstructive and reflux uropathy: Secondary | ICD-10-CM

## 2019-08-18 DIAGNOSIS — I10 Essential (primary) hypertension: Secondary | ICD-10-CM | POA: Diagnosis not present

## 2019-08-18 DIAGNOSIS — G8929 Other chronic pain: Secondary | ICD-10-CM | POA: Diagnosis not present

## 2019-08-18 DIAGNOSIS — M25562 Pain in left knee: Secondary | ICD-10-CM | POA: Diagnosis not present

## 2019-08-18 DIAGNOSIS — N401 Enlarged prostate with lower urinary tract symptoms: Secondary | ICD-10-CM

## 2019-08-18 DIAGNOSIS — M6281 Muscle weakness (generalized): Secondary | ICD-10-CM

## 2019-08-18 DIAGNOSIS — M159 Polyosteoarthritis, unspecified: Secondary | ICD-10-CM

## 2019-08-18 NOTE — Therapy (Signed)
[facility] [address] [place], [address] Phone: [phone]   Fax:  [phone]  Physical Therapy Treatment  Patient Details  Name: Jason Contreras MRN: [id number] Date of Birth: [birth year] No data recorded  Encounter Date: 08/18/2019  PT End of Session - 08/18/19 1620    Visit Number  11    Number of Visits  13    Date for PT Re-Evaluation  08/24/19    Authorization Type  1/10    PT Start Time  1600    PT Stop Time  1645    PT Time Calculation (min)  45 min    Equipment Utilized During Treatment  Gait belt    Activity Tolerance  Patient limited by pain    Behavior During Therapy  WFL for tasks assessed/performed       Past Medical History:  Diagnosis Date  . Adenocarcinoma, renal cell ([place])   . Arthritis   . Collagen vascular disease ([place])   . Diabetes mellitus without complication ([place])   . Elevated PSA   . Headache   . Hematuria   . Hyperlipidemia   . Hypertension   . Neuropathy   . Obesity   . PONV (postoperative nausea and vomiting)   . Renal insufficiency   . Right renal mass     Past Surgical History:  Procedure Laterality Date  . APPENDECTOMY    . CRYOABLATION  10/17/2017  . IR RADIOLOGIST EVAL & MGMT  07/17/2017  . IR RADIOLOGIST EVAL & MGMT  08/13/2017  . IR RADIOLOGIST EVAL & MGMT  11/12/2017  . IR RADIOLOGIST EVAL & MGMT  03/05/2018  . RADIOLOGY WITH ANESTHESIA Right 10/17/2017   Procedure: CT WITH ANESTHESIA RENAL CRYOABLATION;  Surgeon: [name], MD;  Location: [facility];  Service: Anesthesiology;  Laterality: Right;  . ROBOTIC ASSITED PARTIAL NEPHRECTOMY Right 12/12/2015   Procedure: ROBOTIC ASSITED PARTIAL NEPHRECTOMY;  Surgeon: [name], MD;  Location: [facility];  Service: Urology;  Laterality: Right;    There were no vitals filed for this visit.  Subjective Assessment - 08/18/19 1600    Subjective  Patient states he's been performing his exercises. Patient  states his back feels about the same compared to the previous session.    Pertinent History  Hx of renal cancer, Family Hx of bone cancer, sudden unexplained weight loss, decreased appetite, Decreased saddle sensation, and increasing spread of pain throughout body    Limitations  Lifting;Standing    How long can you stand comfortably?  5 minutes    How long can you walk comfortably?  can't walk w/o pain    Patient Stated Goals  get back to walking pain free    Currently in Pain?  Yes    Pain Score  3     Pain Location  Back    Pain Orientation  Left    Pain Descriptors / Indicators  Aching    Pain Type  Chronic pain    Pain Onset  More than a month ago    Pain Frequency  Intermittent    Pain Onset  More than a month ago           TREATMENT Therapeutic Exercise Hip abduction in sidelying - 2 x 10  Hip ER in sidelying - 2 x 10 Prone press ups - x 10 Hip extension with straight leg  - 2 x 10 Ambulation with focus on performing throughout greater amount of lumbar extension -  858ft with focus on improving lumbar extension  Performed exercises to address hip weakness and pain     Manual Therapy  STM performed to lumbar extensors, QL, and superior aspect of the glutes with patient positioned in prone to decrease increased spasms and pain - 15 min Sidelying rotational mobilization - 2 x 1 min B   PT Education - 08/18/19 1620    Education Details  form/technique with exercise    Person(s) Educated  Patient    Methods  Explanation;Demonstration    Comprehension  Verbalized understanding;Returned demonstration       PT Short Term Goals - 08/17/19 0952      PT SHORT TERM GOAL #1   Title  Patient will be independent in HEP in order to reduce number of visits    Baseline  independnent with HEP    Time  2    Period  Weeks    Status  Achieved    Target Date  07/13/19        PT Long Term Goals - 08/17/19 1000      PT LONG TERM GOAL #1   Title  Patient will be able to obtain  full knee extension range of motion to ambulate appropriately    Baseline  -20 degree knee extension; 08/17/2019: -5 degree (equal to the unaffected)    Time  6    Period  Weeks    Status  On-going      PT LONG TERM GOAL #2   Title  Patient will have his worst knee pain reduced to 5/10 on NPRS in order to feel more comfortable walking around the house    Baseline  eval: 9/10; 08/17/2019: 0/10    Time  6    Period  Weeks    Status  Achieved      PT LONG TERM GOAL #3   Title  Patient will show an ability to walk for 5 minutes without an increase in pain    Baseline  pain immediately; 08/17/2019: 2 minutes    Time  6    Period  Weeks    Status  On-going      PT LONG TERM GOAL #4   Title  Patient will have a worst low back and hip pain of 2/10 to to indicate ability to perform greater walking and bending with less overall pain.    Baseline  6/10    Time  6    Period  Weeks    Status  New            Plan - 08/18/19 1623    Clinical Impression Statement  Continued to address low back pain during today's session. Patient demosntrates increased guarding along his superier aspect of his glutes, multifidi and QL B to decrease increased spasms and improve overall guarding. Patient able to perform exercises with less pain overall after STM. Will continue to focus on improving guarding to return to prior level of function.    Personal Factors and Comorbidities  Age;Comorbidity 3+    Comorbidities  Diabetes, chronic pain, Hx CA    Examination-Activity Limitations  Bed Mobility;Locomotion Level;Transfers;Squat;Stairs;Sleep    Stability/Clinical Decision Making  Unstable/Unpredictable    Rehab Potential  Fair    PT Frequency  2x / week    PT Duration  8 weeks    PT Treatment/Interventions  ADLs/Self Care Home Management;Moist Heat;Traction;Biofeedback;Ultrasound;Cryotherapy;Electrical Stimulation;Gait training;Stair training;Functional mobility training;Therapeutic activities;Therapeutic  exercise;Balance training;Neuromuscular re-education;Patient/family education;Manual techniques;Passive range of motion;Dry needling;Energy conservation;Taping;Spinal Manipulations;Joint Manipulations  PT Next Visit Plan  Address ROM impairments at the knee with STM and joint mobs    PT Home Exercise Plan  AROM on ball    Consulted and Agree with Plan of Care  Patient       Patient will benefit from skilled therapeutic intervention in order to improve the following deficits and impairments:  Abnormal gait, Decreased endurance, Decreased coordination, Decreased range of motion, Difficulty walking, Decreased activity tolerance, Impaired UE functional use, Impaired vision/preception, Pain, Decreased balance, Hypomobility, Decreased mobility, Decreased strength  Visit Diagnosis: Chronic bilateral low back pain without sciatica  Muscle weakness (generalized)  Pain in left hip  Chronic pain of left knee     Problem List Patient Active Problem List   Diagnosis Date Noted  . Seronegative arthritis 07/15/2019  . Orthostatic hypotension 07/12/2019  . B12 deficiency 07/12/2019  . Low hemoglobin 07/12/2019  . Pain syndrome, chronic 06/30/2019  . Primary osteoarthritis involving multiple joints 06/30/2019  . Vitamin D deficiency 06/30/2019  . DJD (degenerative joint disease), lumbosacral 11/04/2017  . Personal history of renal cancer 10/17/2017  . Advanced care planning/counseling discussion 01/13/2017  . Senile purpura ([place]) 09/26/2016  . Elevated PSA 01/07/2015  . BPH with obstruction/lower urinary tract symptoms 01/07/2015  . DM due to underlying condition with diabetic nephropathy ([place])   . Hypertension associated with diabetes ([place])   . Hyperlipidemia associated with type 2 diabetes mellitus ([place])     Blythe Stanford, PT DPT 08/18/2019, 4:49 PM  [facility] [address], [place], [address] Phone:  [phone]   Fax:  [phone]  Name: Jason Contreras MRN: [id number] Date of Birth: 09-02-1940

## 2019-08-18 NOTE — Patient Instructions (Signed)
Visit Information  Goals Addressed            This Visit's Progress     Patient Stated   . PharmD "His pain is terrible" (pt-stated)       CARE PLAN ENTRY (see longtitudinal plan of care for additional care plan information)  Current Barriers:  . Polypharmacy; complex patient with multiple comorbidities including HTN, HLD, arthritis, T2DM . Self-manages medications. Daughter, Bernerd Limbo, is very involved in his care . Per chart review, previous [facility] pharmacist assisted in application for Medicare Extra Help. Approved.  o Arthritis: reports significant knee, back, hip, elbows, finger joint pain. Rheumatology following. RA panel neg but elevated CRP and ESR with significant relief from prednisone taper. Last rheumatology visit started on leflunamide and 30 day course of prednisone 72m daily. Prednisone course is now completed but still taking leflunamide. Patient notes that his once significant debilitating full body pain is now much more controlled. He is seeing PT twice weekly with great benefit. o Chronic pain: duloxetine 60 mg daily, gabapentin 600 mg TID, lidocaine patch. Patient notes he has only needed the lidocaine patch 2-3 times since starting PT and not using his lidocaine ointment at all.  o T2DM: A1c 5.8%, Continues on metformin 1000 mg BID. Previously on glimepiride. Patient reports he has not been checking sugars at home recently but that he will start to check them 2-3 times weekly.  - Breakfast: ham biscuit; sometimes bowl of oatmeal; when in pain, will sometimes eat a piece of toast;  - Lunch: Varies, sometimes a plate from [employer], sometimes leftovers from supper (today will be a small piece of steak and onion rings) - Dinner: similar as above - Drinks: diet coke, water, o HTN: carvedilol 6.25 mg BID; losartan recently d/c d/t orthostatic hypotension. Reports that they have been tracking at physical therapy. Patient reports wearing his compression stockings on non PT days but that  he feels they are too constricting. Patient is staying hydrated and always keeping a water bottle close by.  o ASCVD risk reduction: atorvastatin 20 mg daily; last LDL at goal <70 o BPH : Patient with history of RCC being followed by urology on tamsulosin 0.419mdaily in the evenings. Recently elevated PSA with scheduled prostate biopsy. Patient reports frequent urination throughout the night (4-5x nightly) with difficulty voiding during the day. o Supplementation: vitamin B12 1000 mcg daily, Vitamin D3 1000 units daily  Pharmacist Clinical Goal(s):  . Marland Kitchenver the next 90 days, patient will work with PharmD and provider towards optimized medication management  Interventions: . Comprehensive medication review performed, medication list updated in electronic medical record . Reviewed safe management of diabetes. Patient will start to monitor his BG 2-3 times weekly.  . Reviewed goal A1c, goal fasting, and goal 2 hour post prandial readings.  . Reviewed orthostatic hypotension and continuing to stay hydrated and wear compression stockings. Patient will try taking tamsulosin in the morning on non-PT days to stop him from urinating frequently at night but will monitor for increased dizziness.  . Patient to follow up with urology for prostate biopsy.  Patient Self Care Activities:  . Patient will take medications as prescribed  Please see past updates related to this goal by clicking on the "Past Updates" button in the selected goal         Patient verbalizes understanding of instructions provided today.    Plan:  - Scheduled f/u call 11/10/19  Catie TrDarnelle MaffucciPharmD, [phone]

## 2019-08-18 NOTE — Chronic Care Management (AMB) (Signed)
Chronic Care Management   Follow Up Note   08/18/2019 Name: Jason Contreras MRN: [id number] DOB: Dec 29, 1940  Referred by: Venita Lick, NP Reason for referral : Chronic Care Management (Medication Management)   Jason Contreras is a 79 y.o. year old male who is a primary care patient of [name], [name], NP. The CCM team was consulted for assistance with chronic disease management and care coordination needs.    Contacted patient for medication management review.  Review of patient status, including review of consultants reports, relevant laboratory and other test results, and collaboration with appropriate care team members and the patient's provider was performed as part of comprehensive patient evaluation and provision of chronic care management services.    SDOH (Social Determinants of Health) assessments performed: Yes See Care Plan activities for detailed interventions related to [facility])     Outpatient Encounter Medications as of 08/18/2019  Medication Sig Note  . atorvastatin (LIPITOR) 20 MG tablet TAKE 1 TABLET BY MOUTH  DAILY AT 6 PM.   . carvedilol (COREG) 6.25 MG tablet TAKE 1 TABLET BY MOUTH TWO  TIMES DAILY   . cholecalciferol (VITAMIN D3) 25 MCG (1000 UNIT) tablet Take 1,000 Units by mouth daily.   . DULoxetine (CYMBALTA) 60 MG capsule Take 1 capsule (60 mg total) by mouth daily.   [name] gabapentin (NEURONTIN) 600 MG tablet Take 1 tablet (600 mg total) by mouth 3 (three) times daily.   [name] leflunomide (ARAVA) 20 MG tablet Take 20 mg by mouth daily.   [name] lidocaine (LIDODERM) 5 % Place 1 patch onto the skin daily. Remove & Discard patch within 12 hours or as directed by MD 08/18/2019: Only taking 2-3 since starting PT  . metFORMIN (GLUCOPHAGE) 500 MG tablet TAKE 2 TABLETS BY MOUTH TWO TIMES DAILY   . tamsulosin (FLOMAX) 0.4 MG CAPS capsule TAKE 1 CAPSULE BY MOUTH  DAILY   . vitamin B-12 (CYANOCOBALAMIN) 1000 MCG tablet Take 1,000 mcg by mouth daily.   . diazepam (VALIUM) 10 MG  tablet Take 30 minutes prior to biopsy (Patient not taking: Reported on 08/11/2019)   . lidocaine (XYLOCAINE) 5 % ointment Apply 1 application topically 3 (three) times daily as needed. (Patient not taking: Reported on 08/18/2019)   . mometasone (ELOCON) 0.1 % cream APPLY  CREAM TOPICALLY ONCE DAILY (Patient not taking: Reported on 08/11/2019)   . predniSONE (DELTASONE) 10 MG tablet Taper - 5, 5, 4, 4, 3, 3, 2, 2, 1, 1, 1, 1    No facility-administered encounter medications on file as of 08/18/2019.     Objective:   Goals Addressed            This Visit's Progress     Patient Stated   . PharmD "His pain is terrible" (pt-stated)       CARE PLAN ENTRY (see longtitudinal plan of care for additional care plan information)  Current Barriers:  . Polypharmacy; complex patient with multiple comorbidities including HTN, HLD, arthritis, T2DM . Self-manages medications. Daughter, Jason Contreras, is very involved in his care . Per chart review, previous [facility] pharmacist assisted in application for Medicare Extra Help. Approved.  o Arthritis: reports significant knee, back, hip, elbows, finger joint pain. Rheumatology following. RA panel neg but elevated CRP and ESR with significant relief from prednisone taper. Last rheumatology visit started on leflunamide and 30 day course of prednisone 10m daily. Prednisone course is now completed but still taking leflunamide. Patient notes that his once significant debilitating full body pain is  now much more controlled. He is seeing PT twice weekly with great benefit. o Chronic pain: duloxetine 60 mg daily, gabapentin 600 mg TID, lidocaine patch. Patient notes he has only needed the lidocaine patch 2-3 times since starting PT and not using his lidocaine ointment at all.  o T2DM: A1c 5.8%, Continues on metformin 1000 mg BID. Previously on glimepiride. Patient reports he has not been checking sugars at home recently but that he will start to check them 2-3 times weekly.   - Breakfast: ham biscuit; sometimes bowl of oatmeal; when in pain, will sometimes eat a piece of toast;  - Lunch: Varies, sometimes a plate from [employer], sometimes leftovers from supper (today will be a small piece of steak and onion rings) - Dinner: similar as above - Drinks: diet coke, water, o HTN: carvedilol 6.25 mg BID; losartan recently d/c d/t orthostatic hypotension. Reports that they have been tracking at physical therapy. Patient reports wearing his compression stockings on non PT days but that he feels they are too constricting. Patient is staying hydrated and always keeping a water bottle close by.  o ASCVD risk reduction: atorvastatin 20 mg daily; last LDL at goal <70 o BPH : Patient with history of RCC being followed by urology on tamsulosin 0.64m daily in the evenings. Recently elevated PSA with scheduled prostate biopsy. Patient reports frequent urination throughout the night (4-5x nightly) with difficulty voiding during the day. o Supplementation: vitamin B12 1000 mcg daily, Vitamin D3 1000 units daily  Pharmacist Clinical Goal(s):  .Marland KitchenOver the next 90 days, patient will work with PharmD and provider towards optimized medication management  Interventions: . Comprehensive medication review performed, medication list updated in electronic medical record . Reviewed safe management of diabetes. Patient will start to monitor his BG 2-3 times weekly.  . Reviewed goal A1c, goal fasting, and goal 2 hour post prandial readings.  . Reviewed orthostatic hypotension and continuing to stay hydrated and wear compression stockings. Patient will try taking tamsulosin in the morning on non-PT days to stop him from urinating frequently at night but will monitor for increased dizziness.  . Patient to follow up with urology for prostate biopsy.  Patient Self Care Activities:  . Patient will take medications as prescribed  Please see past updates related to this goal by clicking on the "Past Updates"  button in the selected goal          Plan:  - Scheduled f/u call 11/10/19  Catie TDarnelle Maffucci PharmD, [phone]

## 2019-08-23 ENCOUNTER — Ambulatory Visit: Payer: Medicare Other

## 2019-08-23 ENCOUNTER — Other Ambulatory Visit: Payer: Self-pay

## 2019-08-23 DIAGNOSIS — M25552 Pain in left hip: Secondary | ICD-10-CM

## 2019-08-23 DIAGNOSIS — M6281 Muscle weakness (generalized): Secondary | ICD-10-CM | POA: Diagnosis not present

## 2019-08-23 DIAGNOSIS — G8929 Other chronic pain: Secondary | ICD-10-CM | POA: Diagnosis not present

## 2019-08-23 DIAGNOSIS — M545 Low back pain: Secondary | ICD-10-CM | POA: Diagnosis not present

## 2019-08-23 DIAGNOSIS — M25562 Pain in left knee: Secondary | ICD-10-CM | POA: Diagnosis not present

## 2019-08-23 IMAGING — MR MR ABDOMEN WO/W CM
9 of 17 series · 25 of 48 positions shown · IV contrast (20mL MULTIHANCE)
Comparison: 06/19/2017 CT abdomen/pelvis.

CLINICAL DATA: Status post partial right nephrectomy 12/12/2015 for
papillary renal cell carcinoma. Suspicion of a new right renal mass
on recent CT.

EXAM:
MRI ABDOMEN WITHOUT AND WITH CONTRAST
TECHNIQUE: Multiplanar multisequence MR imaging of the abdomen was performed
both before and after the administration of intravenous contrast.
CONTRAST:  20mL MULTIHANCE GADOBENATE DIMEGLUMINE 529 MG/ML IV SOLN

[Series 2: T2 · coronal · 6.0mm · 0.78mm/px · 1 of 35 slices shown]
[im 1/35]
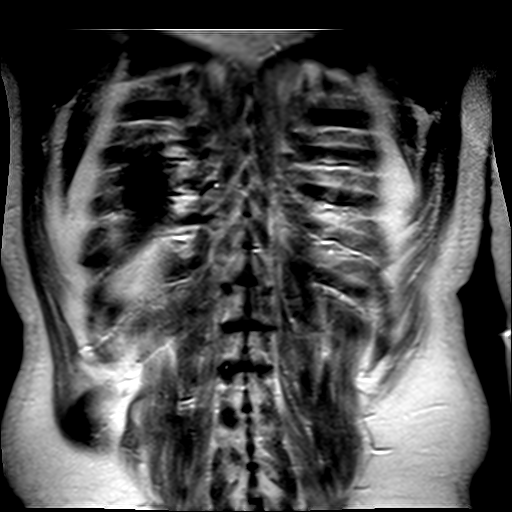

[Series 3: T2 fat-sat · axial · 6.0mm · 0.74mm/px · 1 of 43 slices shown]
[im 1/43]
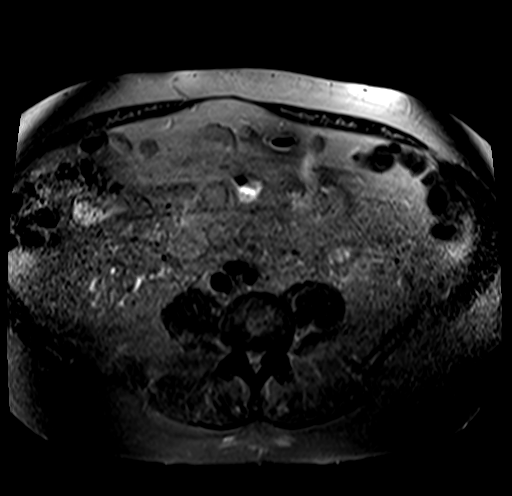

[Series 4: T1 · axial · 6.0mm · 0.74mm/px · z∈[-107,+196]mm · 4 of 86 slices shown]
[im 1/86]
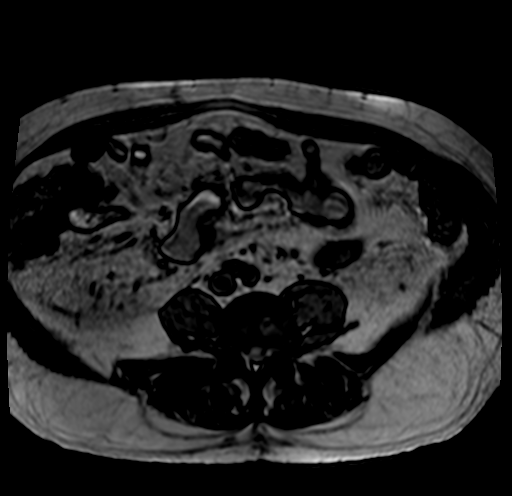
[im 29/86]
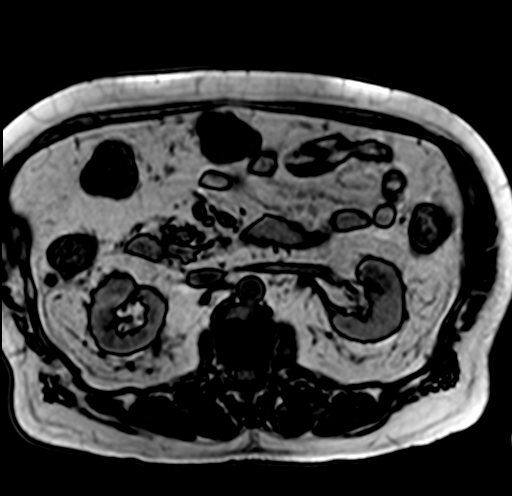
[im 57/86]
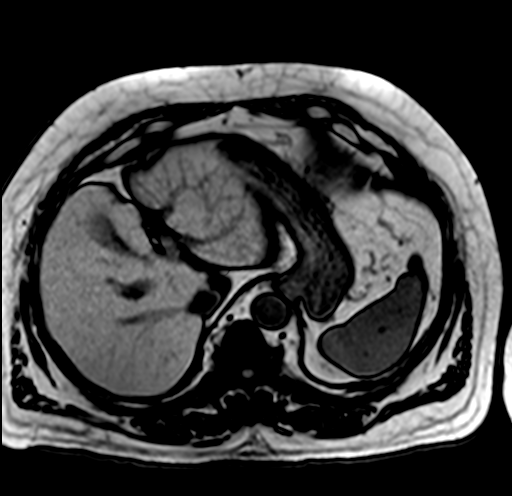
[im 86/86]
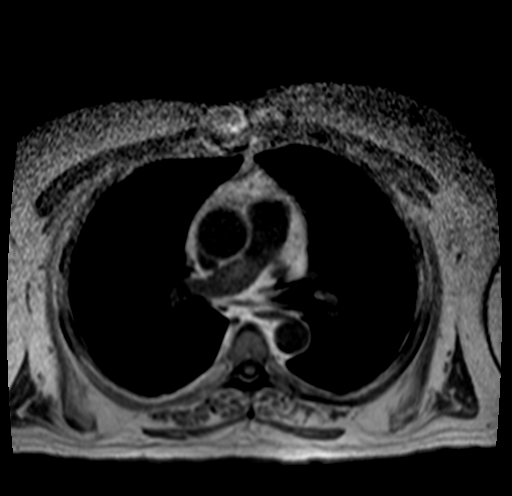

[Series 6: DWI · axial · 6.0mm · 1.98mm/px · z∈[-107,+196]mm · 5 of 129 slices shown]
[im 1/129]
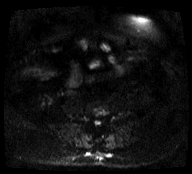
[im 33/129]
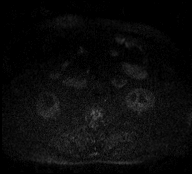
[im 65/129]
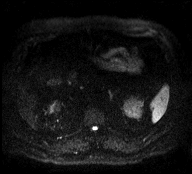
[im 97/129]
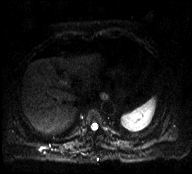
[im 129/129]
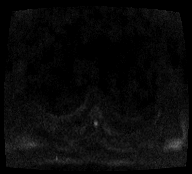

[Series 7: ax dwi_adc · axial · 6.0mm · 1.98mm/px · z∈[-107,+196]mm · 2 of 43 slices shown]
[im 1/43]
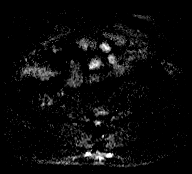
[im 43/43]
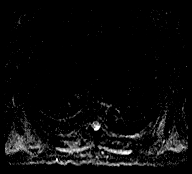

[Series 8: bSSFP · axial · 4.0mm · 0.74mm/px · z∈[-106,+194]mm · 3 of 76 slices shown]
[im 1/76]
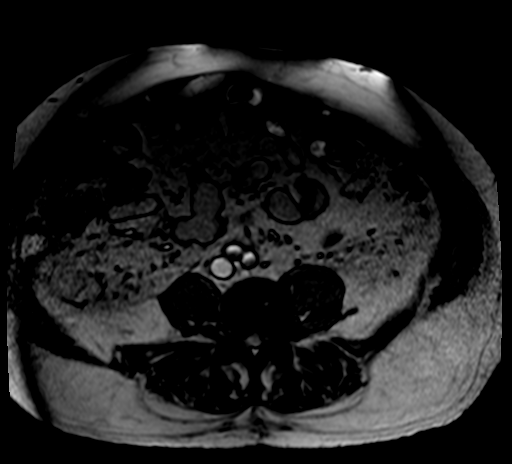
[im 38/76]
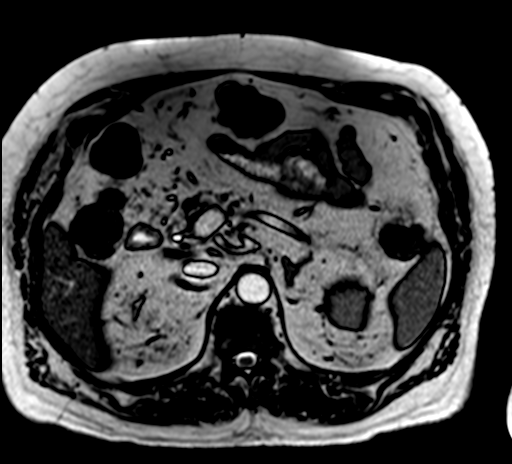
[im 76/76]
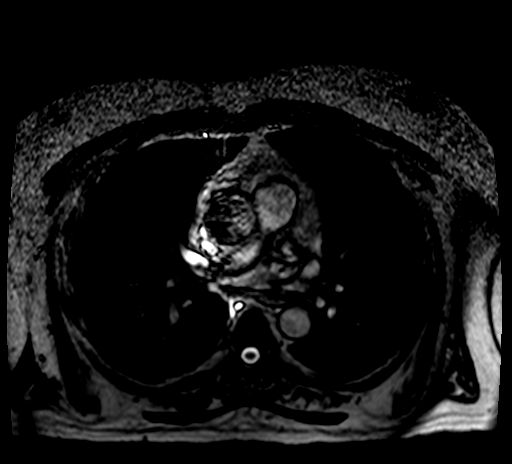

[Series 9: T1 dynamic fat-sat · axial · non-contrast · 4.5mm · 0.74mm/px · z∈[-115,+204]mm · 3 of 72 slices shown (1 of 2)]
[im 1/72]
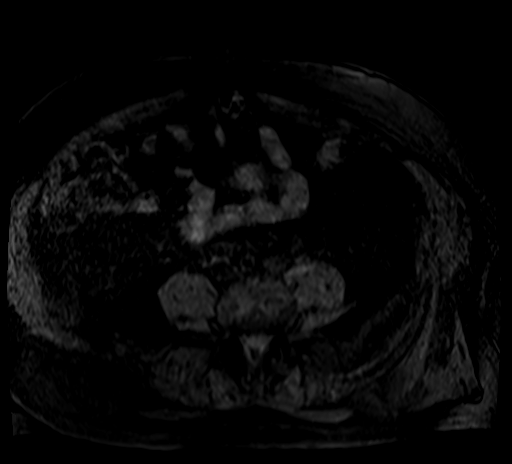
[im 36/72]
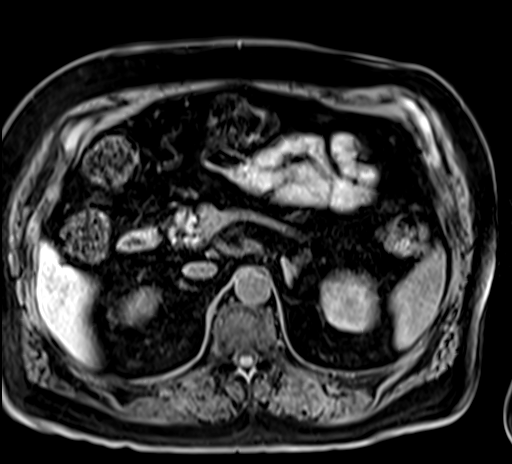
[im 72/72]
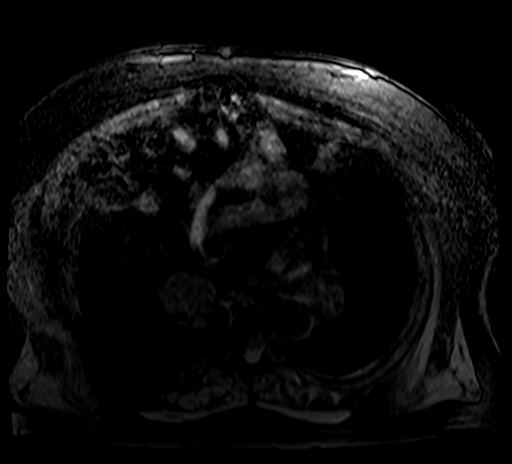

[Series 10: T1 dynamic fat-sat post-contrast · axial · 4.5mm · 0.74mm/px · z∈[-115,+204]mm · 3 of 72 slices shown]
[im 1/72]
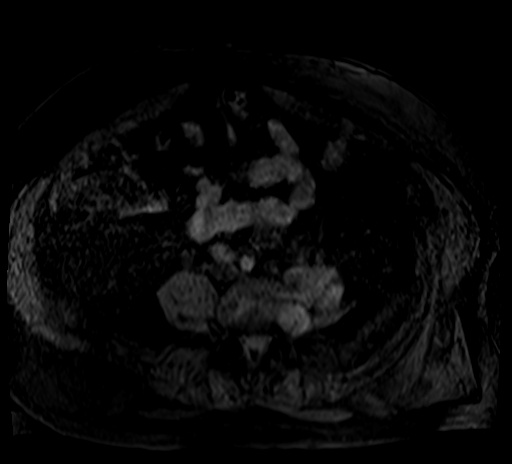
[im 36/72]
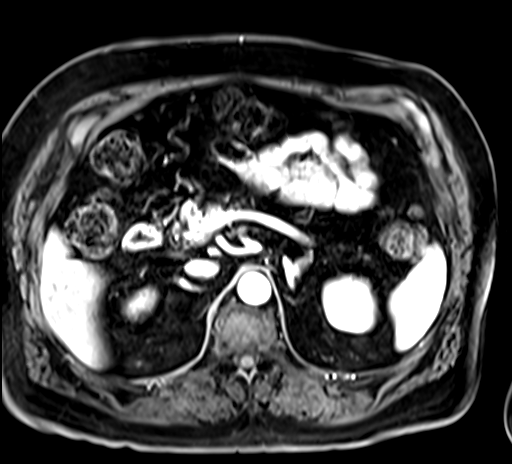
[im 72/72]
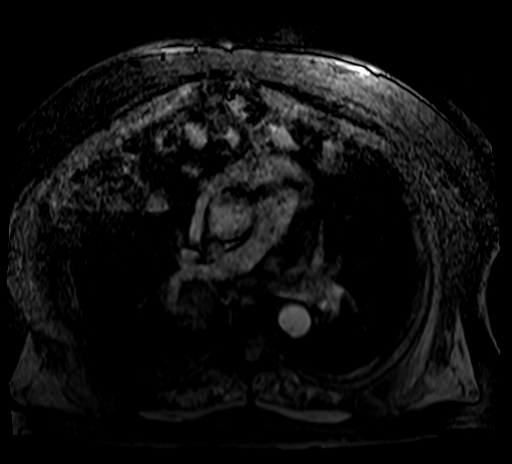

[Series 11: T1 dynamic fat-sat · axial · 4.5mm · 0.74mm/px · z∈[-115,+204]mm · 3 of 72 slices shown (2 of 2)]
[im 1/72]
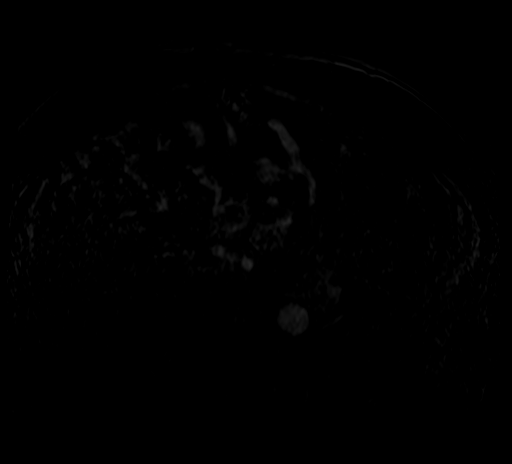
[im 36/72]
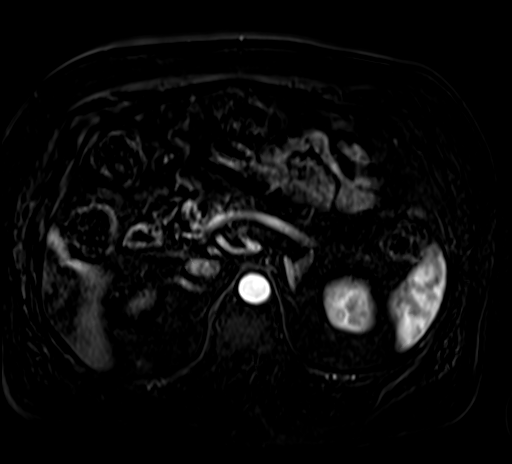
[im 72/72]
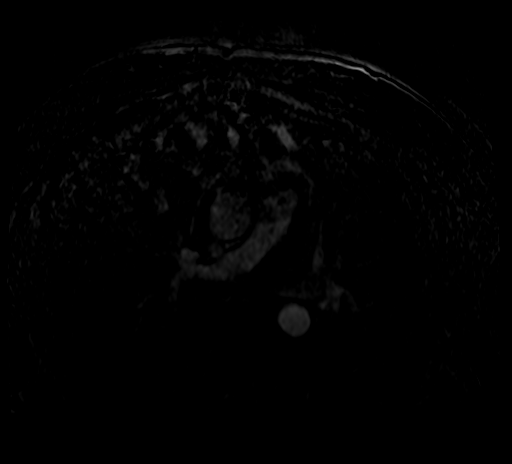

[25 of 48 positions shown; findings below may reference images not displayed]

FINDINGS: Lower chest: No acute abnormality at the lung bases.

Hepatobiliary: Normal liver size and configuration. No hepatic
steatosis. There is a tiny T2 hyperintense 0.3 cm liver lesion at
the posterior left liver lobe capsule (series 19/image 15), too
small to accurately characterize, without appreciable enhancement,
probably a benign cyst. No additional liver lesions. Normal
gallbladder with no cholelithiasis. No biliary ductal dilatation.
Common bile duct diameter 4 mm. No choledocholithiasis.

Pancreas: There is a tiny unilocular 0.5 cm pancreatic tail cystic
lesion (series 3/image 21) without appreciable enhancement, wall
thickening or internal septations. No additional pancreatic lesions.
No pancreatic duct dilation. No pancreas divisum.

Spleen: Normal size. No mass.

Adrenals/Urinary Tract: Normal adrenals. No hydronephrosis. Stable
postsurgical changes from partial nephrectomy in the anterior
interpolar right kidney, with no evidence of a recurrent mass in the
partial nephrectomy bed. There is a 1.9 x 1.7 cm avidly enhancing
renal cortical mass in the medial posterior interpolar right kidney
(series 3/image 34), which demonstrates heterogeneous T2
hyperintensity, most compatible with clear cell renal cell
carcinoma. No additional renal masses.

Stomach/Bowel: Grossly normal stomach. Visualized small and large
bowel is normal caliber, with no bowel wall thickening. Scattered
right colonic diverticulosis.

Vascular/Lymphatic: Atherosclerotic nonaneurysmal abdominal aorta.
Patent portal, splenic, hepatic and renal veins. No pathologically
enlarged lymph nodes in the abdomen.

Other: No abdominal ascites or focal fluid collection.

Musculoskeletal: No aggressive appearing focal osseous lesions.
IMPRESSION: 1. Avidly enhancing 1.9 cm renal cortical mass in the medial
posterior interpolar right kidney, most compatible with clear cell
renal cell carcinoma.
2. Patent renal veins with no tumor thrombus. No evidence of
metastatic disease in the abdomen.
3. No evidence of recurrent tumor in the partial nephrectomy bed in
the anterior interpolar right kidney.
4. Subcentimeter nonaggressive nonenhancing pancreatic tail cystic
lesion. Follow-up MRI abdomen without and with IV contrast
recommended in 2 years. This recommendation follows ACR consensus
guidelines: Management of Incidental Pancreatic Cysts: A White Paper
of the ACR Incidental Findings Committee. [HOSPITAL]

## 2019-08-23 NOTE — Therapy (Signed)
[place] [facility] [address] [place], [address] Phone: [phone]   Fax:  [phone]  Physical Therapy Treatment  Patient Details  Name: Jason Contreras MRN: [id number] Date of Birth: Sep 05, 1940 No data recorded  Encounter Date: 08/23/2019  PT End of Session - 08/23/19 0915    Visit Number  12    Number of Visits  13    Date for PT Re-Evaluation  08/24/19    Authorization Type  2/10    PT Start Time  0900    PT Stop Time  0945    PT Time Calculation (min)  45 min    Equipment Utilized During Treatment  Gait belt    Activity Tolerance  Patient limited by pain    Behavior During Therapy  [facility] for tasks assessed/performed       Past Medical History:  Diagnosis Date  . Adenocarcinoma, renal cell ([place])   . Arthritis   . Collagen vascular disease ([place])   . Diabetes mellitus without complication ([place])   . Elevated PSA   . Headache   . Hematuria   . Hyperlipidemia   . Hypertension   . Neuropathy   . Obesity   . PONV (postoperative nausea and vomiting)   . Renal insufficiency   . Right renal mass     Past Surgical History:  Procedure Laterality Date  . APPENDECTOMY    . CRYOABLATION  10/17/2017  . IR RADIOLOGIST EVAL & MGMT  07/17/2017  . IR RADIOLOGIST EVAL & MGMT  08/13/2017  . IR RADIOLOGIST EVAL & MGMT  11/12/2017  . IR RADIOLOGIST EVAL & MGMT  03/05/2018  . RADIOLOGY WITH ANESTHESIA Right 10/17/2017   Procedure: CT WITH ANESTHESIA RENAL CRYOABLATION;  Surgeon: [name], MD;  Location: [facility] ORS;  Service: Anesthesiology;  Laterality: Right;  . ROBOTIC ASSITED PARTIAL NEPHRECTOMY Right 12/12/2015   Procedure: ROBOTIC ASSITED PARTIAL NEPHRECTOMY;  Surgeon: [name], MD;  Location: [facility] ORS;  Service: Urology;  Laterality: Right;    There were no vitals filed for this visit.  Subjective Assessment - 08/23/19 0906    Subjective  Patient reports increased stress this weekend. Patient states  increased back pain this morning.    Pertinent History  Hx of renal cancer, Family Hx of bone cancer, sudden unexplained weight loss, decreased appetite, Decreased saddle sensation, and increasing spread of pain throughout body    Limitations  Lifting;Standing    How long can you stand comfortably?  5 minutes    How long can you walk comfortably?  can't walk w/o pain    Patient Stated Goals  get back to walking pain free    Currently in Pain?  Yes    Pain Score  4     Pain Location  Back    Pain Orientation  Lower    Pain Descriptors / Indicators  Aching    Pain Type  Chronic pain    Pain Onset  More than a month ago    Pain Frequency  Intermittent    Pain Onset  More than a month ago       TREATMENT Therapeutic Exercise Prone press ups - x 10 Hip circles in standing with use of RTB - x 20  Pelvic tilts in hooklying, sitting and standing - x 20 each potision Hip extension in standing with RTB around ankles - x 20  Squats in standing with ant pelvic tilt - x 20  Self - mobilization  with LAX ball - x 20    Performed exercises to address pelvic tilts and lumbar strength    Manual Therapy  STM performed to lumbar extensors with patient positioned in prone to decrease increased spasms and pain - 10 min   PT Education - 08/23/19 0915    Education Details  form/technique with exercise    Person(s) Educated  Patient    Methods  Explanation;Demonstration    Comprehension  Verbalized understanding;Returned demonstration       PT Short Term Goals - 08/17/19 0952      PT SHORT TERM GOAL #1   Title  Patient will be independent in HEP in order to reduce number of visits    Baseline  independnent with HEP    Time  2    Period  Weeks    Status  Achieved    Target Date  07/13/19        PT Long Term Goals - 08/17/19 1000      PT LONG TERM GOAL #1   Title  Patient will be able to obtain full knee extension range of motion to ambulate appropriately    Baseline  -20 degree knee  extension; 08/17/2019: -5 degree (equal to the unaffected)    Time  6    Period  Weeks    Status  On-going      PT LONG TERM GOAL #2   Title  Patient will have his worst knee pain reduced to 5/10 on NPRS in order to feel more comfortable walking around the house    Baseline  eval: 9/10; 08/17/2019: 0/10    Time  6    Period  Weeks    Status  Achieved      PT LONG TERM GOAL #3   Title  Patient will show an ability to walk for 5 minutes without an increase in pain    Baseline  pain immediately; 08/17/2019: 2 minutes    Time  6    Period  Weeks    Status  On-going      PT LONG TERM GOAL #4   Title  Patient will have a worst low back and hip pain of 2/10 to to indicate ability to perform greater walking and bending with less overall pain.    Baseline  6/10    Time  6    Period  Weeks    Status  New            Plan - 08/23/19 [id number]    Clinical Impression Statement  Continued to focus on improving low back pain. Patient demonstrates decreased pain after performing manual therapy, most notabley after ischemic compression along the multifidus on the R side. Reviewed pelvic tilts today and given to patient for HEP. Patient with decreased pain at the end of the session and will benefit form further skilled therapy to return to prior level of function.    Personal Factors and Comorbidities  Age;Comorbidity 3+    Comorbidities  Diabetes, chronic pain, Hx CA    Examination-Activity Limitations  Bed Mobility;Locomotion Level;Transfers;Squat;Stairs;Sleep    Stability/Clinical Decision Making  Unstable/Unpredictable    Rehab Potential  Fair    PT Frequency  2x / week    PT Duration  8 weeks    PT Treatment/Interventions  ADLs/Self Care Home Management;Moist Heat;Traction;Biofeedback;Ultrasound;Cryotherapy;Electrical Stimulation;Gait training;Stair training;Functional mobility training;Therapeutic activities;Therapeutic exercise;Balance training;Neuromuscular re-education;Patient/family  education;Manual techniques;Passive range of motion;Dry needling;Energy conservation;Taping;Spinal Manipulations;Joint Manipulations    PT Next Visit Plan  Address ROM  impairments at the knee with STM and joint mobs    PT Home Exercise Plan  AROM on ball    Consulted and Agree with Plan of Care  Patient       Patient will benefit from skilled therapeutic intervention in order to improve the following deficits and impairments:  Abnormal gait, Decreased endurance, Decreased coordination, Decreased range of motion, Difficulty walking, Decreased activity tolerance, Impaired UE functional use, Impaired vision/preception, Pain, Decreased balance, Hypomobility, Decreased mobility, Decreased strength  Visit Diagnosis: Muscle weakness (generalized)  Chronic bilateral low back pain without sciatica  Pain in left hip     Problem List Patient Active Problem List   Diagnosis Date Noted  . Seronegative arthritis 07/15/2019  . Orthostatic hypotension 07/12/2019  . B12 deficiency 07/12/2019  . Low hemoglobin 07/12/2019  . Pain syndrome, chronic 06/30/2019  . Primary osteoarthritis involving multiple joints 06/30/2019  . Vitamin D deficiency 06/30/2019  . DJD (degenerative joint disease), lumbosacral 11/04/2017  . Personal history of renal cancer 10/17/2017  . Advanced care planning/counseling discussion 01/13/2017  . Senile purpura ([place]) 09/26/2016  . Elevated PSA 01/07/2015  . BPH with obstruction/lower urinary tract symptoms 01/07/2015  . DM due to underlying condition with diabetic nephropathy ([place])   . Hypertension associated with diabetes ([place])   . Hyperlipidemia associated with type 2 diabetes mellitus ([place])     Blythe Stanford, PT DPT 08/23/2019, 9:40 AM  [place] [facility] [address] [place], [address] Phone: [phone]   Fax:  [phone]  Name: CARDIN MALSOM MRN: [id number] Date of Birth: [birth year]

## 2019-08-25 ENCOUNTER — Other Ambulatory Visit: Payer: Self-pay

## 2019-08-25 ENCOUNTER — Ambulatory Visit: Payer: Medicare Other

## 2019-08-25 DIAGNOSIS — G8929 Other chronic pain: Secondary | ICD-10-CM | POA: Diagnosis not present

## 2019-08-25 DIAGNOSIS — M25562 Pain in left knee: Secondary | ICD-10-CM | POA: Diagnosis not present

## 2019-08-25 DIAGNOSIS — M6281 Muscle weakness (generalized): Secondary | ICD-10-CM | POA: Diagnosis not present

## 2019-08-25 DIAGNOSIS — M25552 Pain in left hip: Secondary | ICD-10-CM | POA: Diagnosis not present

## 2019-08-25 DIAGNOSIS — M545 Low back pain: Secondary | ICD-10-CM | POA: Diagnosis not present

## 2019-08-25 NOTE — Therapy (Signed)
[facility] [address] [place], [address] Phone: [phone]   Fax:  [phone]  Physical Therapy Treatment  Patient Details  Name: Jason Contreras MRN: [id number] Date of Birth: 05-02-1941 No data recorded  Encounter Date: 08/25/2019  PT End of Session - 08/25/19 1104    Visit Number  13    Number of Visits  13    Date for PT Re-Evaluation  08/24/19    Authorization Type  3/10    PT Start Time  0945    PT Stop Time  1030    PT Time Calculation (min)  45 min    Equipment Utilized During Treatment  Gait belt    Activity Tolerance  Patient limited by pain    Behavior During Therapy  WFL for tasks assessed/performed       Past Medical History:  Diagnosis Date  . Adenocarcinoma, renal cell ([place])   . Arthritis   . Collagen vascular disease ([place])   . Diabetes mellitus without complication ([place])   . Elevated PSA   . Headache   . Hematuria   . Hyperlipidemia   . Hypertension   . Neuropathy   . Obesity   . PONV (postoperative nausea and vomiting)   . Renal insufficiency   . Right renal mass     Past Surgical History:  Procedure Laterality Date  . APPENDECTOMY    . CRYOABLATION  10/17/2017  . IR RADIOLOGIST EVAL & MGMT  07/17/2017  . IR RADIOLOGIST EVAL & MGMT  08/13/2017  . IR RADIOLOGIST EVAL & MGMT  11/12/2017  . IR RADIOLOGIST EVAL & MGMT  03/05/2018  . RADIOLOGY WITH ANESTHESIA Right 10/17/2017   Procedure: CT WITH ANESTHESIA RENAL CRYOABLATION;  Surgeon: [name], MD;  Location: [facility];  Service: Anesthesiology;  Laterality: Right;  . ROBOTIC ASSITED PARTIAL NEPHRECTOMY Right 12/12/2015   Procedure: ROBOTIC ASSITED PARTIAL NEPHRECTOMY;  Surgeon: [name], MD;  Location: [facility];  Service: Urology;  Laterality: Right;    There were no vitals filed for this visit.  Subjective Assessment - 08/25/19 0953    Subjective  Patient states increased pain over the past couple of days.  Patient reports pain radiates into the R hip and had worse sleep over the past two days.    Pertinent History  Hx of renal cancer, Family Hx of bone cancer, sudden unexplained weight loss, decreased appetite, Decreased saddle sensation, and increasing spread of pain throughout body    Limitations  Lifting;Standing    How long can you stand comfortably?  5 minutes    How long can you walk comfortably?  can't walk w/o pain    Patient Stated Goals  get back to walking pain free    Currently in Pain?  Yes    Pain Score  5     Pain Location  Back    Pain Orientation  Lower    Pain Descriptors / Indicators  Aching    Pain Type  Chronic pain    Pain Onset  More than a month ago    Pain Frequency  Intermittent    Pain Onset  More than a month ago           TREATMENT Therapeutic Exercise Standing lumbar extension in standing -- x 20 Monster walks with RTB around ankles -- 2 x 72ft Hip hiking in sidelying -- x 20  Hip hiking off of 4" step -- x 20  Hip circles  in sanding with RTB -- x20 Standing hip hiking off of step; not off step -- x 20 B Rotational chops with GTB in sitting laterally and upward B -- x 20     Performed exercises to address pelvic tilts and lumbar strength    Manual Therapy  STM performed to lumbar extensors and QL with patient positioned in prone to decrease increased spasms and pain - 15  min   PT Education - 08/25/19 1104    Education Details  form/technique with exercise;    Person(s) Educated  Patient    Methods  Explanation;Demonstration    Comprehension  Verbalized understanding;Returned demonstration       PT Short Term Goals - 08/17/19 0952      PT SHORT TERM GOAL #1   Title  Patient will be independent in HEP in order to reduce number of visits    Baseline  independnent with HEP    Time  2    Period  Weeks    Status  Achieved    Target Date  07/13/19        PT Long Term Goals - 08/25/19 1105      PT LONG TERM GOAL #1   Title  Patient  will be able to obtain full knee extension range of motion to ambulate appropriately    Baseline  -20 degree knee extension; 08/17/2019: -5 degree (equal to the unaffected)    Time  6    Period  Weeks    Status  Achieved      PT LONG TERM GOAL #2   Title  Patient will have his worst knee pain reduced to 5/10 on NPRS in order to feel more comfortable walking around the house    Baseline  eval: 9/10; 08/17/2019: 0/10    Time  6    Period  Weeks    Status  Achieved      PT LONG TERM GOAL #3   Title  Patient will show an ability to walk for 5 minutes without an increase in pain    Baseline  pain immediately; 08/17/2019: 2 minutes    Time  6    Period  Weeks    Status  On-going      PT LONG TERM GOAL #4   Title  Patient will have a worst low back and hip pain of 2/10 to to indicate ability to perform greater walking and bending with less overall pain.    Baseline  6/10    Time  6    Period  Weeks    Status  New            Plan - 08/25/19 1245    Clinical Impression Statement  Patient with increased QL pain on the Left side with required STM manual therpay to alleviate. Patient demonstrates singificnat improvement with exercises and movement afterwqards indicting decreased overall guarding. Patient will benefit from further skilled therapy focused on improving these limitations to return to prior level of function.    Personal Factors and Comorbidities  Age;Comorbidity 3+    Comorbidities  Diabetes, chronic pain, Hx CA    Examination-Activity Limitations  Bed Mobility;Locomotion Level;Transfers;Squat;Stairs;Sleep    Stability/Clinical Decision Making  Unstable/Unpredictable    Rehab Potential  Fair    PT Frequency  2x / week    PT Duration  8 weeks    PT Treatment/Interventions  ADLs/Self Care Home Management;Moist Heat;Traction;Biofeedback;Ultrasound;Cryotherapy;Electrical Stimulation;Gait training;Stair training;Functional mobility training;Therapeutic activities;Therapeutic  exercise;Balance training;Neuromuscular re-education;Patient/family education;Manual techniques;Passive range  of motion;Dry needling;Energy conservation;Taping;Spinal Manipulations;Joint Manipulations    PT Next Visit Plan  Address ROM impairments at the knee with STM and joint mobs    PT Home Exercise Plan  AROM on ball    Consulted and Agree with Plan of Care  Patient       Patient will benefit from skilled therapeutic intervention in order to improve the following deficits and impairments:  Abnormal gait, Decreased endurance, Decreased coordination, Decreased range of motion, Difficulty walking, Decreased activity tolerance, Impaired UE functional use, Impaired vision/preception, Pain, Decreased balance, Hypomobility, Decreased mobility, Decreased strength  Visit Diagnosis: Muscle weakness (generalized)  Chronic bilateral low back pain without sciatica     Problem List Patient Active Problem List   Diagnosis Date Noted  . Seronegative arthritis 07/15/2019  . Orthostatic hypotension 07/12/2019  . B12 deficiency 07/12/2019  . Low hemoglobin 07/12/2019  . Pain syndrome, chronic 06/30/2019  . Primary osteoarthritis involving multiple joints 06/30/2019  . Vitamin D deficiency 06/30/2019  . DJD (degenerative joint disease), lumbosacral 11/04/2017  . Personal history of renal cancer 10/17/2017  . Advanced care planning/counseling discussion 01/13/2017  . Senile purpura ([place]) 09/26/2016  . Elevated PSA 01/07/2015  . BPH with obstruction/lower urinary tract symptoms 01/07/2015  . DM due to underlying condition with diabetic nephropathy ([place])   . Hypertension associated with diabetes ([place])   . Hyperlipidemia associated with type 2 diabetes mellitus ([place])     Blythe Stanford, PT DPT 08/25/2019, 12:46 PM  [facility] [address] [place], [address] Phone: [phone]   Fax:  [phone]  Name: Jason Contreras MRN: [id number] Date of Birth: 1940/06/05

## 2019-08-30 ENCOUNTER — Ambulatory Visit: Payer: Medicare Other

## 2019-08-30 ENCOUNTER — Other Ambulatory Visit: Payer: Self-pay

## 2019-08-30 DIAGNOSIS — M545 Low back pain: Secondary | ICD-10-CM | POA: Diagnosis not present

## 2019-08-30 DIAGNOSIS — G8929 Other chronic pain: Secondary | ICD-10-CM

## 2019-08-30 DIAGNOSIS — M6281 Muscle weakness (generalized): Secondary | ICD-10-CM

## 2019-08-30 DIAGNOSIS — M25552 Pain in left hip: Secondary | ICD-10-CM | POA: Diagnosis not present

## 2019-08-30 DIAGNOSIS — M25562 Pain in left knee: Secondary | ICD-10-CM | POA: Diagnosis not present

## 2019-08-30 NOTE — Therapy (Signed)
[facility] [address] [place], [address] Phone: [phone]   Fax:  [phone]  Physical Therapy Treatment  Patient Details  Name: Jason Contreras MRN: [id number] Date of Birth: [birth year] No data recorded  Encounter Date: 08/30/2019  PT End of Session - 08/30/19 0910    Visit Number  14    Number of Visits  22    Date for PT Re-Evaluation  08/24/19    Authorization Type  1/10    PT Start Time  0900    PT Stop Time  0945    PT Time Calculation (min)  45 min    Equipment Utilized During Treatment  Gait belt    Activity Tolerance  Patient limited by pain    Behavior During Therapy  WFL for tasks assessed/performed       Past Medical History:  Diagnosis Date  . Adenocarcinoma, renal cell ([place])   . Arthritis   . Collagen vascular disease ([place])   . Diabetes mellitus without complication ([place])   . Elevated PSA   . Headache   . Hematuria   . Hyperlipidemia   . Hypertension   . Neuropathy   . Obesity   . PONV (postoperative nausea and vomiting)   . Renal insufficiency   . Right renal mass     Past Surgical History:  Procedure Laterality Date  . APPENDECTOMY    . CRYOABLATION  10/17/2017  . IR RADIOLOGIST EVAL & MGMT  07/17/2017  . IR RADIOLOGIST EVAL & MGMT  08/13/2017  . IR RADIOLOGIST EVAL & MGMT  11/12/2017  . IR RADIOLOGIST EVAL & MGMT  03/05/2018  . RADIOLOGY WITH ANESTHESIA Right 10/17/2017   Procedure: CT WITH ANESTHESIA RENAL CRYOABLATION;  Surgeon: [name], MD;  [facility]: [facility];  Service: Anesthesiology;  Laterality: Right;  . ROBOTIC ASSITED PARTIAL NEPHRECTOMY Right 12/12/2015   Procedure: ROBOTIC ASSITED PARTIAL NEPHRECTOMY;  Surgeon: [name], MD;  Location: [facility];  Service: Urology;  Laterality: Right;    There were no vitals filed for this visit.  Subjective Assessment - 08/30/19 0905    Subjective  Patient states he has been going to the bathroom often but  states his back has been hurting lower. Patient states his sleep has been affected since he's had the difficulties with his prostate.    Pertinent History  Hx of renal cancer, Family Hx of bone cancer, sudden unexplained weight loss, decreased appetite, Decreased saddle sensation, and increasing spread of pain throughout body    Limitations  Lifting;Standing    How long can you stand comfortably?  5 minutes    How long can you walk comfortably?  can't walk w/o pain    Patient Stated Goals  get back to walking pain free    Currently in Pain?  Yes    Pain Score  4     Pain Location  Back    Pain Orientation  Lower    Pain Descriptors / Indicators  Aching    Pain Onset  More than a month ago    Pain Frequency  Intermittent    Pain Onset  More than a month ago           TREATMENT Therapeutic Exercise Standing lumbar extension in standing -- x 20 Hip swings laterally and forward/backward - x 20 B Rotational chops with 3kg laterally and upward B -- x 20  Seated multifidus crunch with RTB - x 20  Prayer stretch  with physioball - x 15 Paloff press with GTB in standing - x 15 Multifidus crunch with RTB - x 20 Monster walks in standing - x 20 RTB   Performed exercises to address pelvic tilts and lumbar strength       PT Education - 08/30/19 0910    Education Details  form/technique with exercise    Person(s) Educated  Patient    Methods  Explanation;Demonstration    Comprehension  Verbalized understanding;Returned demonstration       PT Short Term Goals - 08/17/19 0952      PT SHORT TERM GOAL #1   Title  Patient will be independent in HEP in order to reduce number of visits    Baseline  independnent with HEP    Time  2    Period  Weeks    Status  Achieved    Target Date  07/13/19        PT Long Term Goals - 08/25/19 1105      PT LONG TERM GOAL #1   Title  Patient will be able to obtain full knee extension range of motion to ambulate appropriately    Baseline  -20  degree knee extension; 08/17/2019: -5 degree (equal to the unaffected)    Time  6    Period  Weeks    Status  Achieved      PT LONG TERM GOAL #2   Title  Patient will have his worst knee pain reduced to 5/10 on NPRS in order to feel more comfortable walking around the house    Baseline  eval: 9/10; 08/17/2019: 0/10    Time  6    Period  Weeks    Status  Achieved      PT LONG TERM GOAL #3   Title  Patient will show an ability to walk for 5 minutes without an increase in pain    Baseline  pain immediately; 08/17/2019: 2 minutes    Time  6    Period  Weeks    Status  On-going      PT LONG TERM GOAL #4   Title  Patient will have a worst low back and hip pain of 2/10 to to indicate ability to perform greater walking and bending with less overall pain.    Baseline  6/10    Time  6    Period  Weeks    Status  New            Plan - 08/30/19 [id number]    Clinical Impression Statement  Patient with increased pain along the multifidus today, focused today's session on strengthening that area and gave resissted pelvic tilts in sitting with resistance band to further address strength limitations. Patient demonstrates improvement overall with low back functioning and conitnues to have increased pain. Patient will benefit from further skilled therapy to return to prior level of function.    Personal Factors and Comorbidities  Age;Comorbidity 3+    Comorbidities  Diabetes, chronic pain, Hx CA    Examination-Activity Limitations  Bed Mobility;Locomotion Level;Transfers;Squat;Stairs;Sleep    Stability/Clinical Decision Making  Unstable/Unpredictable    Rehab Potential  Fair    PT Frequency  2x / week    PT Duration  8 weeks    PT Treatment/Interventions  ADLs/Self Care Home Management;Moist Heat;Traction;Biofeedback;Ultrasound;Cryotherapy;Electrical Stimulation;Gait training;Stair training;Functional mobility training;Therapeutic activities;Therapeutic exercise;Balance training;Neuromuscular  re-education;Patient/family education;Manual techniques;Passive range of motion;Dry needling;Energy conservation;Taping;Spinal Manipulations;Joint Manipulations    PT Next Visit Plan  Address ROM impairments at the  knee with STM and joint mobs    PT Home Exercise Plan  AROM on ball    Consulted and Agree with Plan of Care  Patient       Patient will benefit from skilled therapeutic intervention in order to improve the following deficits and impairments:  Abnormal gait, Decreased endurance, Decreased coordination, Decreased range of motion, Difficulty walking, Decreased activity tolerance, Impaired UE functional use, Impaired vision/preception, Pain, Decreased balance, Hypomobility, Decreased mobility, Decreased strength  Visit Diagnosis: Muscle weakness (generalized)  Chronic bilateral low back pain without sciatica  Pain in left hip     Problem List Patient Active Problem List   Diagnosis Date Noted  . Seronegative arthritis 07/15/2019  . Orthostatic hypotension 07/12/2019  . B12 deficiency 07/12/2019  . Low hemoglobin 07/12/2019  . Pain syndrome, chronic 06/30/2019  . Primary osteoarthritis involving multiple joints 06/30/2019  . Vitamin D deficiency 06/30/2019  . DJD (degenerative joint disease), lumbosacral 11/04/2017  . Personal history of renal cancer 10/17/2017  . Advanced care planning/counseling discussion 01/13/2017  . Senile purpura ([place]) 09/26/2016  . Elevated PSA 01/07/2015  . BPH with obstruction/lower urinary tract symptoms 01/07/2015  . DM due to underlying condition with diabetic nephropathy ([place])   . Hypertension associated with diabetes ([place])   . Hyperlipidemia associated with type 2 diabetes mellitus ([place])     Blythe Stanford, PT DPT 08/30/2019, 9:37 AM  [facility] [address], [place], [address] Phone: [phone]   Fax:  [phone]  Name: Jason Contreras MRN: [id number] Date  of Birth: 1941-04-28

## 2019-08-30 NOTE — Addendum Note (Signed)
Addended by: Blain Pais on: 08/30/2019 09:24 AM   Modules accepted: Orders

## 2019-09-02 ENCOUNTER — Ambulatory Visit: Payer: Medicare Other | Admitting: Urology

## 2019-09-02 ENCOUNTER — Other Ambulatory Visit: Payer: Self-pay

## 2019-09-02 ENCOUNTER — Encounter: Payer: Self-pay | Admitting: Urology

## 2019-09-02 ENCOUNTER — Other Ambulatory Visit: Payer: Self-pay | Admitting: Urology

## 2019-09-02 VITALS — BP 170/92 | HR 94

## 2019-09-02 DIAGNOSIS — R972 Elevated prostate specific antigen [PSA]: Secondary | ICD-10-CM

## 2019-09-02 MED ORDER — GENTAMICIN SULFATE 40 MG/ML IJ SOLN
80.0000 mg | Freq: Once | INTRAMUSCULAR | Status: AC
  Administered 2019-09-02: 80 mg via INTRAMUSCULAR

## 2019-09-02 MED ORDER — LEVOFLOXACIN 500 MG PO TABS
500.0000 mg | ORAL_TABLET | Freq: Once | ORAL | Status: AC
  Administered 2019-09-02: 500 mg via ORAL

## 2019-09-02 NOTE — Progress Notes (Signed)
   09/02/19  CC:  Chief Complaint  Patient presents with  . Prostate Biopsy    HPI: 79 year old male with rising PSA as well as a abnormal prostate MRI with a PI-RADS 5 lesion at the right anterior transition zone.  Please see previous notes for details.  He presents today for prostate biopsy.   Blood pressure (!) 170/92, pulse 94. NED. A&Ox3.   No respiratory distress   Abd soft, NT, ND Normal sphincter tone  Prostate Biopsy Procedure   Informed consent was obtained after discussing risks/benefits of the procedure.  A time out was performed to ensure correct patient identity.  Pre-Procedure: - Gentamicin given prophylactically - Levaquin 500 mg administered PO -Transrectal Ultrasound performed revealing a 67 gm prostate -No significant hypoechoic or median lobe noted  Procedure: - Prostate block performed using 10 cc 1% lidocaine and biopsies taken from sextant areas, a total of 12 under ultrasound guidance.  An additional 13th biopsy was included in the right mid trying to target the anterior mid peripheral zone.  Post-Procedure: - Patient tolerated the procedure well - He was counseled to seek immediate medical attention if experiences any severe pain, significant bleeding, or fevers - Return in one week to discuss biopsy results     No follow-ups on file.  [name], MD

## 2019-09-06 NOTE — Telephone Encounter (Signed)
Error

## 2019-09-07 ENCOUNTER — Ambulatory Visit: Payer: Medicare Other

## 2019-09-10 LAB — ANATOMIC PATHOLOGY REPORT

## 2019-09-14 DIAGNOSIS — M8949 Other hypertrophic osteoarthropathy, multiple sites: Secondary | ICD-10-CM | POA: Diagnosis not present

## 2019-09-14 DIAGNOSIS — M1380 Other specified arthritis, unspecified site: Secondary | ICD-10-CM | POA: Diagnosis not present

## 2019-09-14 DIAGNOSIS — Z79899 Other long term (current) drug therapy: Secondary | ICD-10-CM | POA: Diagnosis not present

## 2019-09-14 NOTE — Progress Notes (Signed)
09/15/19 3:33 PM   Jason Contreras 10-31-1940 [id number]  Referring provider: Venita Lick, NP [address] [place],  [place] [address] Chief Complaint  Patient presents with  . Results    HPI: Jason Contreras is a 79 y.o. M with rising PSA as well as a abnormal prostate MRI with a PI-RADS 5 lesion at the right anterior transition zone returns today for f/u s/p biopsy.   DRE  6/20 by Carlis Stable which was unremarkable.  Prostate biopsy in 2017 was negative with TRUS volume of 41.   His MRI of prostate from 07/23/19 revealed PI-RADS category 5 lesion of the right anterior transition zone and right anterior fibromuscular stroma at the prostate base and mid gland. Lesion measures 2.1 by 1.4 by 2.2 cm.   He underwent a prostate bx on 09/02/19 which was unremarkable, no evidence of malignancy.  He reports of improvement in his urinary symptoms. He is currently on Flomax and has not tried finasteride.   Most recent PSA 13.3 as of 07/06/19.   PSA Trend:  Component     Latest Ref Rng & Units 07/12/2016 01/13/2017 01/13/2017 07/29/2017          1:58 PM  2:06 PM   Prostate Specific Ag, Serum     0.0 - 4.0 ng/mL 5.1 (H) 4.8 (H) CANCELED 6.8 (H)   Component     Latest Ref Rng & Units 01/12/2018 10/08/2018 05/28/2019 07/06/2019             Prostate Specific Ag, Serum     0.0 - 4.0 ng/mL 5.3 (H) 5.9 (H) 10.3 (H) 13.3 (H)    PMH: Past Medical History:  Diagnosis Date  . Adenocarcinoma, renal cell ([place])   . Arthritis   . Collagen vascular disease ([place])   . Diabetes mellitus without complication ([place])   . Elevated PSA   . Headache   . Hematuria   . Hyperlipidemia   . Hypertension   . Neuropathy   . Obesity   . PONV (postoperative nausea and vomiting)   . Renal insufficiency   . Right renal mass     Surgical History: Past Surgical History:  Procedure Laterality Date  . APPENDECTOMY    . CRYOABLATION  10/17/2017  . IR RADIOLOGIST EVAL & MGMT  07/17/2017  . IR RADIOLOGIST EVAL & MGMT   08/13/2017  . IR RADIOLOGIST EVAL & MGMT  11/12/2017  . IR RADIOLOGIST EVAL & MGMT  03/05/2018  . RADIOLOGY WITH ANESTHESIA Right 10/17/2017   Procedure: CT WITH ANESTHESIA RENAL CRYOABLATION;  Surgeon: [name], MD;  Location: [facility];  Service: Anesthesiology;  Laterality: Right;  . ROBOTIC ASSITED PARTIAL NEPHRECTOMY Right 12/12/2015   Procedure: ROBOTIC ASSITED PARTIAL NEPHRECTOMY;  Surgeon: [name], MD;  Location: [facility];  Service: Urology;  Laterality: Right;    Home Medications:  Allergies as of 09/15/2019      Reactions   Morphine Nausea And Vomiting   Morphine And Related Nausea And Vomiting      Medication List       Accurate as of Sep 15, 2019 11:59 PM. If you have any questions, ask your nurse or doctor.        atorvastatin 20 MG tablet Commonly known as: LIPITOR TAKE 1 TABLET BY MOUTH  DAILY AT 6 PM.   carvedilol 6.25 MG tablet Commonly known as: COREG TAKE 1 TABLET BY MOUTH TWO  TIMES DAILY   cholecalciferol 25 MCG (1000 UNIT) tablet Commonly known  as: VITAMIN D3 Take 1,000 Units by mouth daily.   diazepam 10 MG tablet Commonly known as: Valium Take 30 minutes prior to biopsy   DULoxetine 60 MG capsule Commonly known as: CYMBALTA Take 1 capsule (60 mg total) by mouth daily.   finasteride 5 MG tablet Commonly known as: PROSCAR Take 1 tablet (5 mg total) by mouth daily. Started by: [name], MD   gabapentin 600 MG tablet Commonly known as: NEURONTIN Take 1 tablet (600 mg total) by mouth 3 (three) times daily.   leflunomide 20 MG tablet Commonly known as: ARAVA Take 20 mg by mouth daily.   lidocaine 5 % ointment Commonly known as: XYLOCAINE Apply 1 application topically 3 (three) times daily as needed.   lidocaine 5 % Commonly known as: LIDODERM Place 1 patch onto the skin daily. Remove & Discard patch within 12 hours or as directed by MD   metFORMIN 500 MG tablet Commonly known as: GLUCOPHAGE TAKE 2 TABLETS BY MOUTH TWO TIMES  DAILY   mometasone 0.1 % cream Commonly known as: ELOCON APPLY  CREAM TOPICALLY ONCE DAILY   tamsulosin 0.4 MG Caps capsule Commonly known as: FLOMAX TAKE 1 CAPSULE BY MOUTH  DAILY   vitamin B-12 1000 MCG tablet Commonly known as: CYANOCOBALAMIN Take 1,000 mcg by mouth daily.       Allergies:  Allergies  Allergen Reactions  . Morphine Nausea And Vomiting  . Morphine And Related Nausea And Vomiting    Family History: Family History  Problem Relation Age of Onset  . Bone cancer Mother   . Cancer Mother   . Cancer Father   . Stroke Brother   . Kidney disease Neg Hx     Social History:  reports that he has never smoked. He has never used smokeless tobacco. He reports that he does not drink alcohol or use drugs.   Physical Exam: BP (!) 147/84   Pulse (!) 118   Wt 211 lb (95.7 kg)   BMI 32.08 kg/m   Constitutional:  Alert and oriented, No acute distress. HEENT: [place] AT, moist mucus membranes.  Trachea midline, no masses. Cardiovascular: No clubbing, cyanosis, or edema. Respiratory: Normal respiratory effort, no increased work of breathing. Skin: No rashes, bruises or suspicious lesions. Neurologic: Grossly intact, no focal deficits, moving all 4 extremities. Psychiatric: Normal mood and affect.  Laboratory Data:  Lab Results  Component Value Date   CREATININE 0.91 07/12/2019    Lab Results  Component Value Date   HGBA1C 5.8 05/28/2019   Assessment & Plan:    1. Elevated PSA   Most recent PSA elevated to 13.3  MRI of prostate from 07/23/19 revealed PI-RADS category 5 lesion in anterior position  Prostate bx on 09/02/19 unremarkable, still concern for lesion given anterior position and high grade lesion on MR Discussed fusion bx vs surveillance w/ PSA in 2 months given uncertainty in findings  He has elected surveillance w/ PSA in 6 weeks followed by next day virtual visit   2. BPH w/ obstruction  Currently on Flomax  Not interested in surgical  intervention, opted for optimal medical management  Start finasteride in conjunction w/ Flomax to optimize Discusssed risks/ side effects of meds   [facility] [address] [place], [place] [address] [phone]  I, Lucas Mallow, am acting as a scribe for Dr. [name],  I have reviewed the above documentation for accuracy and completeness, and I agree with the above.   [name],  MD

## 2019-09-15 ENCOUNTER — Other Ambulatory Visit: Payer: Self-pay

## 2019-09-15 ENCOUNTER — Ambulatory Visit: Payer: Medicare Other | Admitting: Urology

## 2019-09-15 ENCOUNTER — Encounter: Payer: Self-pay | Admitting: Urology

## 2019-09-15 VITALS — BP 147/84 | HR 118 | Wt 211.0 lb

## 2019-09-15 DIAGNOSIS — R972 Elevated prostate specific antigen [PSA]: Secondary | ICD-10-CM

## 2019-09-15 MED ORDER — FINASTERIDE 5 MG PO TABS: 5.0000 mg | ORAL_TABLET | Freq: Every day | ORAL | 4 refills | Status: DC

## 2019-09-20 ENCOUNTER — Ambulatory Visit: Payer: Medicare Other

## 2019-10-01 ENCOUNTER — Ambulatory Visit: Payer: Self-pay | Admitting: General Practice

## 2019-10-01 ENCOUNTER — Telehealth: Payer: Self-pay

## 2019-10-01 NOTE — Chronic Care Management (AMB) (Signed)
  Chronic Care Management   Outreach Note  10/01/2019 Name: Jason Contreras MRN: [id number] DOB: [birth year]  Referred by: Venita Lick, NP Reason for referral : Chronic Care Management (Follow up: Prostate Cancer, HTN, DM2, HLD, Chronic Pain- new concerns)   An unsuccessful telephone outreach was attempted today. The patient was referred to the case management team for assistance with care management and care coordination.   Follow Up Plan: A HIPPA compliant phone message was left for the patient providing contact information and requesting a return call.   Noreene Larsson RN, MSN, [facility] Mobile: [phone]

## 2019-10-11 ENCOUNTER — Ambulatory Visit (INDEPENDENT_AMBULATORY_CARE_PROVIDER_SITE_OTHER): Payer: Medicare Other | Admitting: Nurse Practitioner

## 2019-10-11 ENCOUNTER — Other Ambulatory Visit: Payer: Self-pay

## 2019-10-11 ENCOUNTER — Encounter: Payer: Self-pay | Admitting: Nurse Practitioner

## 2019-10-11 VITALS — BP 130/82 | HR 70 | Temp 98.0°F | Ht 69.0 in | Wt 207.2 lb

## 2019-10-11 DIAGNOSIS — D519 Vitamin B12 deficiency anemia, unspecified: Secondary | ICD-10-CM | POA: Diagnosis not present

## 2019-10-11 DIAGNOSIS — Z23 Encounter for immunization: Secondary | ICD-10-CM | POA: Diagnosis not present

## 2019-10-11 DIAGNOSIS — E1159 Type 2 diabetes mellitus with other circulatory complications: Secondary | ICD-10-CM

## 2019-10-11 DIAGNOSIS — Z1159 Encounter for screening for other viral diseases: Secondary | ICD-10-CM

## 2019-10-11 DIAGNOSIS — E538 Deficiency of other specified B group vitamins: Secondary | ICD-10-CM

## 2019-10-11 DIAGNOSIS — I10 Essential (primary) hypertension: Secondary | ICD-10-CM

## 2019-10-11 DIAGNOSIS — D692 Other nonthrombocytopenic purpura: Secondary | ICD-10-CM

## 2019-10-11 DIAGNOSIS — N401 Enlarged prostate with lower urinary tract symptoms: Secondary | ICD-10-CM

## 2019-10-11 DIAGNOSIS — E785 Hyperlipidemia, unspecified: Secondary | ICD-10-CM

## 2019-10-11 DIAGNOSIS — E1169 Type 2 diabetes mellitus with other specified complication: Secondary | ICD-10-CM | POA: Diagnosis not present

## 2019-10-11 DIAGNOSIS — E0821 Diabetes mellitus due to underlying condition with diabetic nephropathy: Secondary | ICD-10-CM | POA: Diagnosis not present

## 2019-10-11 DIAGNOSIS — E559 Vitamin D deficiency, unspecified: Secondary | ICD-10-CM

## 2019-10-11 DIAGNOSIS — N138 Other obstructive and reflux uropathy: Secondary | ICD-10-CM

## 2019-10-11 DIAGNOSIS — R972 Elevated prostate specific antigen [PSA]: Secondary | ICD-10-CM

## 2019-10-11 LAB — BAYER DCA HB A1C WAIVED: HB A1C (BAYER DCA - WAIVED): 7.5 % — ABNORMAL HIGH (ref <7.0)

## 2019-10-11 NOTE — Patient Instructions (Signed)

## 2019-10-11 NOTE — Assessment & Plan Note (Signed)
Chronic, ongoing.  Continue current medication regimen and adjust as needed. Lipid panel today. 

## 2019-10-11 NOTE — Progress Notes (Signed)
BP 130/82 (BP Location: Left Arm)   Pulse 70   Temp 98 F (36.7 C) (Oral)   Ht 5\' 9"  (1.753 m)   Wt 207 lb 3.2 oz (94 kg)   SpO2 99%   BMI 30.60 kg/m    Subjective:    Patient ID: Jason Contreras, male    DOB: October 23, 1940, 79 y.o.   MRN: [id number]  HPI: Jason Contreras is a 79 y.o. male  Chief Complaint  Patient presents with  . Hypertension  . Hyperlipidemia  . Vitamin B12 Def  . Vitamin D Def   DIABETES Continues on Metformin 1000 MG BID, discontinued Actos and Amaryl at past visits.  Previous A1C in January 5.8%.   Hypoglycemic episodes:no Polydipsia/polyuria: no Visual disturbance: no Chest pain: no Paresthesias: no Glucose Monitoring: yes  Accucheck frequency: occasional  Fasting glucose: just got new meter -- older meter 142  Post prandial:  Evening:  Before meals: Taking Insulin?: no  Long acting insulin:  Short acting insulin: Blood Pressure Monitoring: daily Retinal Examination: Not up to Date Foot Exam: Up to Date Pneumovax: Not up to Date Influenza: Up to Date Aspirin: no   HYPERTENSION / HYPERLIPIDEMIA Continues on Coreg 6.25 MG BID + Atorvastatin 20 MG daily. Recent LDL in January 38 and TCHOL 97.   Satisfied with current treatment? yes Duration of hypertension: chronic BP monitoring frequency: a few times a week BP range: 130/70 range at home, sometimes lower 119/60 BP medication side effects: no Duration of hyperlipidemia: chronic Cholesterol medication side effects: no Cholesterol supplements: none Medication compliance: good compliance Aspirin: no Recent stressors: no Recurrent headaches: no Visual changes: no Palpitations: no Dyspnea: no Chest pain: no -- occasional heart burn in morning reported Lower extremity edema: no Dizzy/lightheaded: no  ELEVATED PSA: Being followed by urology.  Last PSA in office 13.3 and with urology on 09/15/19 was 13.3.  Is to go for follow-up PSA July 2nd.  Had negative biopsy, but findings were on  imaging so surveillance.  Has history of kidney cancer to right side, had two operations on this.  Was started on Finasteride at recent urology visit and continues Tamsulosin.  VITAMIN D DEFICIENCY: Level was 25.7 on recent labs, previous 10.1.  Started on supplement.  No recent falls or fractures. Taking B12 supplements for history of low levels, recent level 800.  Relevant past medical, surgical, family and social history reviewed and updated as indicated. Interim medical history since our last visit reviewed. Allergies and medications reviewed and updated.  Review of Systems  Constitutional: Negative for activity change, diaphoresis, fatigue and fever.  Respiratory: Negative for cough, chest tightness, shortness of breath and wheezing.   Cardiovascular: Negative for chest pain, palpitations and leg swelling.  Gastrointestinal: Negative.   Endocrine: Negative for polydipsia, polyphagia and polyuria.  Neurological: Negative for dizziness, syncope, weakness and numbness.  Psychiatric/Behavioral: Negative.     Per HPI unless specifically indicated above     Objective:    BP 130/82 (BP Location: Left Arm)   Pulse 70   Temp 98 F (36.7 C) (Oral)   Ht 5\' 9"  (1.753 m)   Wt 207 lb 3.2 oz (94 kg)   SpO2 99%   BMI 30.60 kg/m   Wt Readings from Last 3 Encounters:  10/11/19 207 lb 3.2 oz (94 kg)  09/15/19 211 lb (95.7 kg)  08/11/19 208 lb 6.4 oz (94.5 kg)    Physical Exam Vitals and nursing note reviewed.  Constitutional:  General: He is awake.     Appearance: He is well-developed and overweight.  HENT:     Head: Normocephalic and atraumatic.     Right Ear: Hearing normal. No drainage.     Left Ear: Hearing normal. No drainage.  Eyes:     General: Lids are normal.        Right eye: No discharge.        Left eye: No discharge.     Conjunctiva/sclera: Conjunctivae normal.     Pupils: Pupils are equal, round, and reactive to light.  Neck:     Thyroid: No thyromegaly.      Vascular: No carotid bruit or JVD.     Trachea: Trachea normal.  Cardiovascular:     Rate and Rhythm: Normal rate and regular rhythm.     Heart sounds: Normal heart sounds, S1 normal and S2 normal. No murmur. No gallop.   Pulmonary:     Effort: Pulmonary effort is normal. No accessory muscle usage or respiratory distress.     Breath sounds: Normal breath sounds.  Abdominal:     General: Bowel sounds are normal.     Palpations: Abdomen is soft.  Musculoskeletal:        General: Normal range of motion.     Cervical back: Normal range of motion and neck supple.     Right lower leg: No edema.     Left lower leg: No edema.  Skin:    General: Skin is warm and dry.     Capillary Refill: Capillary refill takes less than 2 seconds.     Comments: Scattered pale purple/yellow bruises noted to BUE.  Neurological:     Mental Status: He is alert and oriented to person, place, and time.     Cranial Nerves: Cranial nerves are intact.     Coordination: Coordination is intact.     Gait: Gait is intact.     Deep Tendon Reflexes: Reflexes are normal and symmetric.     Reflex Scores:      Brachioradialis reflexes are 2+ on the right side and 2+ on the left side.      Patellar reflexes are 2+ on the right side and 2+ on the left side. Psychiatric:        Mood and Affect: Mood normal.        Behavior: Behavior normal. Behavior is cooperative.        Thought Content: Thought content normal.        Judgment: Judgment normal.    Diabetic Foot Exam - Simple   Simple Foot Form Visual Inspection No deformities, no ulcerations, no other skin breakdown bilaterally: Yes Sensation Testing Intact to touch and monofilament testing bilaterally: Yes Pulse Check Posterior Tibialis and Dorsalis pulse intact bilaterally: Yes Comments      Results for orders placed or performed in visit on 09/02/19  Anatomic Pathology Report  Result Value Ref Range   Diagnosis synopsis: Comment    Specimen: Comment     Clinical history: Comment    Most significant diagnosis: HGPIN    Diagnosis: Comment    Microscopic description: Comment    Grossing comments: Comment    Gross description: Comment    Case comment(s): Comment    Electronically signed by: Comment    CPT code(s): Comment    CPT Disclaimer: Comment    Clinician provided ICD: R97.2    Pathologist provided ICD: N42.31       Assessment & Plan:   Problem List  Items Addressed This Visit      Cardiovascular and Mediastinum   Hypertension associated with diabetes ([place])    Chronic, stable with BP at goal today in office and at home.  Recommend he monitor BP at home at least three mornings a week + focus on DASH diet.  Continue current medication regimen and adjust as needed.  BMP today.  Return in 3 months.      Relevant Orders   Bayer DCA Hb A1c Waived   Basic metabolic panel   Senile purpura (HCC)    Chronic, stable.  Recommend continue to perform gentle skin care and monitor for skin breakdown, if present notify provider.        Endocrine   DM due to underlying condition with diabetic nephropathy ([place]) - Primary    Chronic, ongoing in patient with history of renal cell CA.  A1C today 7.5%, increased from previous but have discontinued some higher risk diabetes medications.  At this time he does not wish to add on medication and wishes to focus on diet.  Monitor BS daily in morning at home.  Continue Metformin, consider use of alternate options such as GLP if elevations present, due his age and risk for hypoglycemia.  Return to office in 3 months.      Relevant Orders   Bayer DCA Hb A1c Waived   Hyperlipidemia associated with type 2 diabetes mellitus (HCC)    Chronic, ongoing.  Continue current medication regimen and adjust as needed.  Lipid panel today.      Relevant Orders   Bayer DCA Hb A1c Waived   Lipid Panel w/o Chol/HDL Ratio     Genitourinary   BPH with obstruction/lower urinary tract symptoms    Chronic, ongoing.   Continue current medication regimen and collaboration with urology.          Other   Elevated PSA    Continue collaboration with urology.  Recent note reviewed.      Vitamin D deficiency    Continue daily supplement and recheck level today.      Relevant Orders   VITAMIN D 25 Hydroxy (Vit-D Deficiency, Fractures)   B12 deficiency    Ongoing and improving with supplement with recent level 800, continue daily supplement and recheck level today.        Relevant Orders   Vitamin B12    Other Visit Diagnoses    Need for hepatitis C screening test       Hep C check on labs   Relevant Orders   Hepatitis C Antibody   Need for 23-polyvalent pneumococcal polysaccharide vaccine       PPSV23 administered today   Relevant Orders   Pneumococcal polysaccharide vaccine 23-valent greater than or equal to 2yo subcutaneous/IM       Follow up plan: Return in about 3 months (around 01/11/2020) for T2DM, HTN/HLD, Arthritis, Prostate.

## 2019-10-11 NOTE — Assessment & Plan Note (Signed)
Chronic, ongoing.  Continue current medication regimen and collaboration with urology.

## 2019-10-11 NOTE — Assessment & Plan Note (Signed)
Chronic, stable with BP at goal today in office and at home.  Recommend he monitor BP at home at least three mornings a week + focus on DASH diet.  Continue current medication regimen and adjust as needed.  BMP today.  Return in 3 months.

## 2019-10-11 NOTE — Assessment & Plan Note (Signed)
Chronic, ongoing in patient with history of renal cell CA.  A1C today 7.5%, increased from previous but have discontinued some higher risk diabetes medications.  At this time he does not wish to add on medication and wishes to focus on diet.  Monitor BS daily in morning at home.  Continue Metformin, consider use of alternate options such as GLP if elevations present, due his age and risk for hypoglycemia.  Return to office in 3 months.

## 2019-10-11 NOTE — Assessment & Plan Note (Signed)
Continue collaboration with urology.  Recent note reviewed. 

## 2019-10-11 NOTE — Assessment & Plan Note (Signed)
Continue daily supplement and recheck level today. 

## 2019-10-11 NOTE — Assessment & Plan Note (Signed)
Chronic, stable.  Recommend continue to perform gentle skin care and monitor for skin breakdown, if present notify provider.

## 2019-10-11 NOTE — Assessment & Plan Note (Signed)
Ongoing and improving with supplement with recent level 800, continue daily supplement and recheck level today.

## 2019-10-12 LAB — LIPID PANEL W/O CHOL/HDL RATIO
Cholesterol, Total: 129 mg/dL (ref 100–199)
HDL: 43 mg/dL (ref >39)
LDL Chol Calc (NIH): 64 mg/dL (ref 0–99)
Triglycerides: 122 mg/dL (ref 0–149)
VLDL Cholesterol Cal: 22 mg/dL (ref 5–40)

## 2019-10-12 LAB — BASIC METABOLIC PANEL
BUN/Creatinine Ratio: 19 (ref 10–24)
BUN: 18 mg/dL (ref 8–27)
CO2: 22 mmol/L (ref 20–29)
Calcium: 9.1 mg/dL (ref 8.6–10.2)
Chloride: 104 mmol/L (ref 96–106)
Creatinine, Ser: 0.96 mg/dL (ref 0.76–1.27)
GFR calc Af Amer: 88 mL/min/{1.73_m2} (ref >59)
GFR calc non Af Amer: 76 mL/min/{1.73_m2} (ref >59)
Glucose: 167 mg/dL — ABNORMAL HIGH (ref 65–99)
Potassium: 4.6 mmol/L (ref 3.5–5.2)
Sodium: 142 mmol/L (ref 134–144)

## 2019-10-12 LAB — VITAMIN D 25 HYDROXY (VIT D DEFICIENCY, FRACTURES): Vit D, 25-Hydroxy: 25.6 ng/mL — ABNORMAL LOW (ref 30.0–100.0)

## 2019-10-12 LAB — VITAMIN B12: Vitamin B-12: 1581 pg/mL — ABNORMAL HIGH (ref 232–1245)

## 2019-10-12 LAB — HEPATITIS C ANTIBODY: Hep C Virus Ab: <0.1 s/co ratio (ref 0.0–0.9)

## 2019-10-12 NOTE — Progress Notes (Signed)
Contacted via [place] evening Jason Contreras.  Your labs have returned.  Kidney function remains stable.  Cholesterol levels are at goal.  Hep C testing is negative.  B12 level has improved, continue daily supplement.  Vitamin D remains on lower side, goal is greater than 30.  Would increase your Vitamin D supplement to 2000 units daily.  Any questions? Keep being awesome!! Kindest regards, Manly Nestle

## 2019-10-18 DIAGNOSIS — Z79899 Other long term (current) drug therapy: Secondary | ICD-10-CM | POA: Diagnosis not present

## 2019-10-18 DIAGNOSIS — M1380 Other specified arthritis, unspecified site: Secondary | ICD-10-CM | POA: Diagnosis not present

## 2019-11-02 ENCOUNTER — Ambulatory Visit: Payer: Medicare Other | Admitting: Urology

## 2019-11-04 ENCOUNTER — Other Ambulatory Visit: Payer: Self-pay | Admitting: Interventional Radiology

## 2019-11-04 DIAGNOSIS — N2889 Other specified disorders of kidney and ureter: Secondary | ICD-10-CM

## 2019-11-05 ENCOUNTER — Other Ambulatory Visit: Payer: Self-pay

## 2019-11-05 ENCOUNTER — Other Ambulatory Visit: Payer: Medicare Other

## 2019-11-05 DIAGNOSIS — R972 Elevated prostate specific antigen [PSA]: Secondary | ICD-10-CM

## 2019-11-06 LAB — PSA: Prostate Specific Ag, Serum: 5.7 ng/mL — ABNORMAL HIGH (ref 0.0–4.0)

## 2019-11-06 NOTE — Progress Notes (Signed)
Virtual Visit via Video Note  I connected with Jason Contreras on 11/09/2019 at 11:15 AM EDT by a video enabled telemedicine application and verified that I am speaking with the correct person using two identifiers.  Location: Patient: home Provider: office    I discussed the limitations of evaluation and management by telemedicine and the availability of in person appointments. The patient expressed understanding and agreed to proceed.  History of Present Illness: Jason Contreras is a 79 y.o. male with an elevated PSA and as well as a abnormal prostate MRI with a PI-RADS 5 lesion at the right anterior transition zone who returns today for a 6 week follow up.   DRE  6/20 by Carlis Stable which was unremarkable.Prostate biopsy in 2017 was negative with TRUS volume of 41.  His MRI of prostate from 07/23/19 revealed PI-RADS category 5 lesion of the right anterior transition zone and right anterior fibromuscular stroma at the prostate base and mid gland. Lesion measures 2.1 by 1.4 by 2.2 cm.   He underwent a prostate bx on 09/02/19 which was unremarkable, no evidence of malignancy.  PSA trended down to 5.7 on 11/05/2019.   Patient remains on finasteride x 2 months with some improvement. He feels like he is not emptying his bladder completely. He notes some nocturia.  Overall his general sense of that he is doing better than previously.  His reports alternating diarrhea and constipation. He feels like GI issues are related to medications.   PSA Trend: 07/29/2017: 6.8 01/12/2018: 5.3 10/08/2018: 5.9 05/28/2019: 10.3 07/06/2019: 13.3 11/05/2019: 5.7  He also has a personal history of renal cell carcinoma status post partial nephrectomy followed by cryoablation for secondary lesion.  Please see previous notes for details.  Last imaging 07/2019 NED.   Observations/Objective: Patient is engaged and asking good questions.   Assessment and Plan:  1. Elevated PSA PSA trended down to 5.7 on  11/05/2019, reassuring both from finasteride effect but also suggested that there is an inflammatory component MRI of prostate from 07/23/19 revealed PI-RADS category 5 lesion in anterior position  Prostate bx on 09/02/19 unremarkable. He has elected surveillance w/ PSA in 4 months.  2. BPH w/ obstruction Currently on Flomax/finasteride Not interested in surgical intervention, opted for optimal medical management  Continue finasteride in conjunction w/ Flomax to optimize for another 4 months  3.  Right renal cell carcinoma x2 Status post cryoablation and partial nephrectomy Will be due for surveillance imaging 07/2020, order at the time of next visit  Follow Up Instructions:  Return in 4 months with PSA and IPSS.   I discussed the assessment and treatment plan with the patient. The patient was provided an opportunity to ask questions and all were answered. The patient agreed with the plan and demonstrated an understanding of the instructions.   The patient was advised to call back or seek an in-person evaluation if the symptoms worsen or if the condition fails to improve as anticipated.    Fransico Him, am acting as a scribe for Dr. [name].  I have reviewed the above documentation for accuracy and completeness, and I agree with the above.   [name], MD

## 2019-11-09 ENCOUNTER — Telehealth (INDEPENDENT_AMBULATORY_CARE_PROVIDER_SITE_OTHER): Payer: Medicare Other | Admitting: Urology

## 2019-11-09 ENCOUNTER — Other Ambulatory Visit: Payer: Self-pay

## 2019-11-09 DIAGNOSIS — C641 Malignant neoplasm of right kidney, except renal pelvis: Secondary | ICD-10-CM | POA: Diagnosis not present

## 2019-11-09 DIAGNOSIS — R972 Elevated prostate specific antigen [PSA]: Secondary | ICD-10-CM

## 2019-11-09 DIAGNOSIS — N138 Other obstructive and reflux uropathy: Secondary | ICD-10-CM

## 2019-11-09 DIAGNOSIS — N401 Enlarged prostate with lower urinary tract symptoms: Secondary | ICD-10-CM

## 2019-11-09 NOTE — Progress Notes (Signed)
This service is provided via telemedicine   No vital signs collected/recorded due to the encounter was a telemedicine visit.     Patient consents to a telephone visit:  yes    Names of all persons participating in the telemedicine service and their role in the encounter:  Jency Schnieders, CMA and Ashley Brandon, MD   

## 2019-11-10 ENCOUNTER — Ambulatory Visit (INDEPENDENT_AMBULATORY_CARE_PROVIDER_SITE_OTHER): Payer: Medicare Other | Admitting: Pharmacist

## 2019-11-10 DIAGNOSIS — R972 Elevated prostate specific antigen [PSA]: Secondary | ICD-10-CM

## 2019-11-10 DIAGNOSIS — N401 Enlarged prostate with lower urinary tract symptoms: Secondary | ICD-10-CM

## 2019-11-10 DIAGNOSIS — E0821 Diabetes mellitus due to underlying condition with diabetic nephropathy: Secondary | ICD-10-CM

## 2019-11-10 DIAGNOSIS — I152 Hypertension secondary to endocrine disorders: Secondary | ICD-10-CM

## 2019-11-10 NOTE — Patient Instructions (Signed)
Visit Information  Goals Addressed              This Visit's Progress     Patient Stated   .  PharmD "His pain is terrible" (pt-stated)        CARE PLAN ENTRY (see longtitudinal plan of care for additional care plan information)  Current Barriers:  . Polypharmacy; complex patient with multiple comorbidities including HTN, HLD, arthritis, T2DM . Self-manages medications. Daughter, Bernerd Limbo, is very involved in his care . Per chart review, previous [facility] pharmacist assisted in application for Medicare Extra Help. o Arthritis: follows w/ rheumatology. Significant symptomatic improvement on leflunomide 20 mg daily. Reports some occasional diarrhea, but notes that the RA improvement is "worth it" o Chronic pain: duloxetine 60 mg daily, gabapentin 600 mg TID o T2DM: A1c 7.5%, Metformin 1000 mg BID - Recently got a new glucometer. Plans to start checking readings tomorrow.  o HTN: carvedilol 6.25 mg BID; not checking BP at home. Wife notes they need a new BP machine o ASCVD risk reduction: atorvastatin 20 mg daily; last LDL at goal <70 o BPH : Appt w/ urology yesterday. PSA decreased w/ finsteride tx, so current thought is that malignancy is not a concern. Patient notes some urologic symptom improvement w/ finasteride + tamsulosin.  o Supplementation: vitamin B12 1000 mcg daily, Vitamin D3 2000 units daily- recently increased per PCP recommendation o Anemia: patient wonders if he needs to be taking iron supplementation.   Pharmacist Clinical Goal(s):  [name] Over the next 90 days, patient will work with PharmD and provider towards optimized medication management  Interventions: . Comprehensive medication review performed, medication list updated in electronic medical record . Inter-disciplinary care team collaboration (see longitudinal plan of care) . Reviewed goal A1c, goal fasting, and goal 2 hour post prandial glucose. Encouraged patient to start checking occasional fastings to evaluate  glucose control. Discussed that prednisone course could have impacted most recent A1c . Educated patient's wife to investigate BP machine through OTC benefits through [facility].  . Praised on good news regarding PSA. Encouraged continued follow up . Educated on macro vs microcytic anemia, reviewed recent lab work from [facility] showing normocytic anemia. Educated patient.  Patient Self Care Activities:  . Patient will take medications as prescribed  Please see past updates related to this goal by clicking on the "Past Updates" button in the selected goal         The patient verbalized understanding of instructions provided today and declined a print copy of patient instruction materials.   Plan:  - Scheduled f/u call in ~ 10 weeks  Catie Darnelle Maffucci, PharmD, [place] [phone]

## 2019-11-10 NOTE — Chronic Care Management (AMB) (Signed)
Chronic Care Management   Follow Up Note   11/10/2019 Name: Jason Contreras MRN: [id number] DOB: December 10, 1940  Referred by: Venita Lick, NP Reason for referral : Chronic Care Management (Medication Management)   Jason Contreras is a 79 y.o. year old male who is a primary care patient of [name], [name], NP. The CCM team was consulted for assistance with chronic disease management and care coordination needs.    Contacted patient for medication management review.   Review of patient status, including review of consultants reports, relevant laboratory and other test results, and collaboration with appropriate care team members and the patient's provider was performed as part of comprehensive patient evaluation and provision of chronic care management services.    SDOH (Social Determinants of Health) assessments performed: No See Care Plan activities for detailed interventions related to [facility])     Outpatient Encounter Medications as of 11/10/2019  Medication Sig  . atorvastatin (LIPITOR) 20 MG tablet TAKE 1 TABLET BY MOUTH  DAILY AT 6 PM.  . carvedilol (COREG) 6.25 MG tablet TAKE 1 TABLET BY MOUTH TWO  TIMES DAILY  . cholecalciferol (VITAMIN D3) 25 MCG (1000 UNIT) tablet Take 2,000 Units by mouth daily.   . DULoxetine (CYMBALTA) 60 MG capsule Take 1 capsule (60 mg total) by mouth daily.  . finasteride (PROSCAR) 5 MG tablet Take 1 tablet (5 mg total) by mouth daily.  [name] gabapentin (NEURONTIN) 600 MG tablet Take 1 tablet (600 mg total) by mouth 3 (three) times daily.  [name] leflunomide (ARAVA) 20 MG tablet Take 20 mg by mouth daily.  . metFORMIN (GLUCOPHAGE) 500 MG tablet TAKE 2 TABLETS BY MOUTH TWO TIMES DAILY  . mometasone (ELOCON) 0.1 % cream APPLY  CREAM TOPICALLY ONCE DAILY  . tamsulosin (FLOMAX) 0.4 MG CAPS capsule TAKE 1 CAPSULE BY MOUTH  DAILY  . vitamin B-12 (CYANOCOBALAMIN) 1000 MCG tablet Take 1,000 mcg by mouth daily.  [name] lidocaine (LIDODERM) 5 % Place 1 patch onto the skin daily.  Remove & Discard patch within 12 hours or as directed by MD (Patient not taking: Reported on 11/10/2019)   No facility-administered encounter medications on file as of 11/10/2019.     Objective:   Goals Addressed              This Visit's Progress     Patient Stated   .  PharmD "His pain is terrible" (pt-stated)        CARE PLAN ENTRY (see longtitudinal plan of care for additional care plan information)  Current Barriers:  . Polypharmacy; complex patient with multiple comorbidities including HTN, HLD, arthritis, T2DM . Self-manages medications. Daughter, Bernerd Limbo, is very involved in his care . Per chart review, previous [facility] pharmacist assisted in application for Medicare Extra Help. o Arthritis: follows w/ rheumatology. Significant symptomatic improvement on leflunomide 20 mg daily. Reports some occasional diarrhea, but notes that the RA improvement is "worth it" o Chronic pain: duloxetine 60 mg daily, gabapentin 600 mg TID o T2DM: A1c 7.5%, Metformin 1000 mg BID - Recently got a new glucometer. Plans to start checking readings tomorrow.  o HTN: carvedilol 6.25 mg BID; not checking BP at home. Wife notes they need a new BP machine o ASCVD risk reduction: atorvastatin 20 mg daily; last LDL at goal <70 o BPH : Appt w/ urology yesterday. PSA decreased w/ finsteride tx, so current thought is that malignancy is not a concern. Patient notes some urologic symptom improvement w/ finasteride + tamsulosin.  o Supplementation:  vitamin B12 1000 mcg daily, Vitamin D3 2000 units daily- recently increased per PCP recommendation o Anemia: patient wonders if he needs to be taking iron supplementation.   Pharmacist Clinical Goal(s):  [name] Over the next 90 days, patient will work with PharmD and provider towards optimized medication management  Interventions: . Comprehensive medication review performed, medication list updated in electronic medical record . Inter-disciplinary care team collaboration  (see longitudinal plan of care) . Reviewed goal A1c, goal fasting, and goal 2 hour post prandial glucose. Encouraged patient to start checking occasional fastings to evaluate glucose control. Discussed that prednisone course could have impacted most recent A1c . Educated patient's wife to investigate BP machine through OTC benefits through [facility].  . Praised on good news regarding PSA. Encouraged continued follow up . Educated on macro vs microcytic anemia, reviewed recent lab work from [facility] showing normocytic anemia. Educated patient.  Patient Self Care Activities:  . Patient will take medications as prescribed  Please see past updates related to this goal by clicking on the "Past Updates" button in the selected goal          Plan:  - Scheduled f/u call in ~ 10 weeks  Catie Darnelle Maffucci, PharmD, [place] [phone]

## 2019-11-26 ENCOUNTER — Telehealth: Payer: Self-pay | Admitting: General Practice

## 2019-11-26 ENCOUNTER — Ambulatory Visit: Payer: Self-pay | Admitting: General Practice

## 2019-11-26 DIAGNOSIS — E1169 Type 2 diabetes mellitus with other specified complication: Secondary | ICD-10-CM | POA: Diagnosis not present

## 2019-11-26 DIAGNOSIS — E1159 Type 2 diabetes mellitus with other circulatory complications: Secondary | ICD-10-CM

## 2019-11-26 DIAGNOSIS — E0821 Diabetes mellitus due to underlying condition with diabetic nephropathy: Secondary | ICD-10-CM

## 2019-11-26 DIAGNOSIS — G894 Chronic pain syndrome: Secondary | ICD-10-CM

## 2019-11-26 DIAGNOSIS — I10 Essential (primary) hypertension: Secondary | ICD-10-CM | POA: Diagnosis not present

## 2019-11-26 DIAGNOSIS — E785 Hyperlipidemia, unspecified: Secondary | ICD-10-CM | POA: Diagnosis not present

## 2019-11-26 DIAGNOSIS — N138 Other obstructive and reflux uropathy: Secondary | ICD-10-CM

## 2019-11-26 DIAGNOSIS — N401 Enlarged prostate with lower urinary tract symptoms: Secondary | ICD-10-CM

## 2019-11-26 NOTE — Chronic Care Management (AMB) (Signed)
Chronic Care Management   Follow Up Note   11/26/2019 Name: Jason Contreras MRN: [id number] DOB: Nov 14, 1940  Referred by: Venita Lick, NP Reason for referral : Appointment (RNCM Chronic Disease Management and Care Coordination Needs)   Jason Contreras is a 79 y.o. year old male who is a primary care patient of [name], [name], NP. The CCM team was consulted for assistance with chronic disease management and care coordination needs.    Review of patient status, including review of consultants reports, relevant laboratory and other test results, and collaboration with appropriate care team members and the patient's provider was performed as part of comprehensive patient evaluation and provision of chronic care management services.    SDOH (Social Determinants of Health) assessments performed: Yes See Care Plan activities for detailed interventions related to [facility])     Outpatient Encounter Medications as of 11/26/2019  Medication Sig  . atorvastatin (LIPITOR) 20 MG tablet TAKE 1 TABLET BY MOUTH  DAILY AT 6 PM.  . carvedilol (COREG) 6.25 MG tablet TAKE 1 TABLET BY MOUTH TWO  TIMES DAILY  . cholecalciferol (VITAMIN D3) 25 MCG (1000 UNIT) tablet Take 2,000 Units by mouth daily.   . DULoxetine (CYMBALTA) 60 MG capsule Take 1 capsule (60 mg total) by mouth daily.  . finasteride (PROSCAR) 5 MG tablet Take 1 tablet (5 mg total) by mouth daily.  [name] gabapentin (NEURONTIN) 600 MG tablet Take 1 tablet (600 mg total) by mouth 3 (three) times daily.  [name] leflunomide (ARAVA) 20 MG tablet Take 20 mg by mouth daily.  [name] lidocaine (LIDODERM) 5 % Place 1 patch onto the skin daily. Remove & Discard patch within 12 hours or as directed by MD (Patient not taking: Reported on 11/10/2019)  . metFORMIN (GLUCOPHAGE) 500 MG tablet TAKE 2 TABLETS BY MOUTH TWO TIMES DAILY  . mometasone (ELOCON) 0.1 % cream APPLY  CREAM TOPICALLY ONCE DAILY  . tamsulosin (FLOMAX) 0.4 MG CAPS capsule TAKE 1 CAPSULE BY MOUTH  DAILY  .  vitamin B-12 (CYANOCOBALAMIN) 1000 MCG tablet Take 1,000 mcg by mouth daily.   No facility-administered encounter medications on file as of 11/26/2019.     Objective:  BP Readings from Last 3 Encounters:  10/11/19 130/82  09/15/19 (!) 147/84  09/02/19 (!) 170/92   Lab Results  Component Value Date   HGBA1C 7.5 (H) 10/11/2019    Goals Addressed            This Visit's Progress   . RNCM: I am having a lot of pain especially in my left knee       Current Barriers:  [name] Knowledge Deficits related to pain control in left knee and other pain due to chronic pain/osteoarthritis . Impaired mobility due to pain in left knee- currently working with physical therapy and it is helping him with pain relief from arthritis   Nurse Case Manager Clinical Goal(s):  [name] Over the next 90 days, patient will verbalize understanding of plan for pain control for chronic pain  . Over the next 90 days, patient will work with pcp and CCM team to address needs related to best options for pain relief in patient with chronic pain issues . Over the next 90 days, patient will demonstrate a decrease in pain  exacerbations as evidenced by pain level being less than 6 on a consistent daily basis . Over the next 90 days, patient will attend all scheduled medical appointments: with orthopedic provider and pcp . Over the next 90  days, patient will demonstrate understanding of rationale for each prescribed medication as evidenced by compliance and taking recommended dosages . Over the next 90 days, patient will work with CM team pharmacist to aide in answering questions and concerns about pain medications and safe administration . Over the next 90 days, the patient will demonstrate ongoing self health care management ability as evidenced by decreased pain level, no falls, and increased mobility  Interventions:  . Evaluation of current treatment plan related to unresolved pain due to arthritis worse in the left knee and  patient's adherence to plan as established by provider. Per the patient he is taking prednisone and leflunamide and his pain level is 99% better. 11-26-2019: The patient is taking the leflunamide and still doing well. Has arthritis in his back but he takes a couple of Tylenol and he is good. He can deal with that pain now and he is happy that the medication is helping. Follows up with the specialist in August. . Reviewed medications with patient and discussed compliance, the patient verbalized he is currently taking prednisone and leflunamide and is having relief from the arthritis pain.  . Discussed plans with patient for ongoing care management follow up and provided patient with direct contact information for care management team . Reviewed scheduled/upcoming provider appointments including: 9/7 with pcp, 8/4 with Dr. Posey Pronto.  for blood work,  and November with Dr. Marca Ancona . Evaluation of new pain concerns or questions. The patient denies any new concerns at this time. The patient feels better than he has in a while. Is happy that his pain is under control. He says he will continue to do what the doctors recommend because he never wants to feel like he did with the pain in his knees, arms, legs and hands.  Patient Self Care Activities:  . Patient verbalizes understanding of arthritis pain and the plan of care for controlling pain . Self administers medications as prescribed . Attends all scheduled provider appointments . Calls provider office for new concerns or questions . Unable to independently control pain due to arthritis without medication therapy and specialist intervention  . Unable to perform IADLs independently (currently having to use a cane when ambulating)- improvement in mobility, working with PT also   Please see past updates related to this goal by clicking on the "Past Updates" button in the selected goal      . RNCM: I try to take my bp and blood sugar readings daily       Current  Barriers:  . Chronic Disease Management support, education, and care coordination needs related to HTN, HLD, and DMII  Clinical Goal(s) related to HTN, HLD, and DMII:  Over the next 90 days, patient will:  . Work with the care management team to address educational, disease management, and care coordination needs  . Begin or continue self health monitoring activities as directed today Measure and record cbg (blood glucose) BID times daily, Measure and record blood pressure 7 times per week, and institue a hearth healthy/ADA diet . Call provider office for new or worsened signs and symptoms Blood glucose findings outside established parameters, Blood pressure findings outside established parameters, and New or worsened symptom related to HTN, HLD, and DM2 . Call care management team with questions or concerns . Verbalize basic understanding of patient centered plan of care established today  Interventions related to HTN, HLD, and DMII:  . Evaluation of current treatment plans and patient's adherence to plan as established by provider .  Assessed patient understanding of disease states. The patient has a good understanding of disease processes. The patient has pain under control but is also dealing with possible new diagnosis of prostate cancer- will see specialist on 08-05-2019.  11-26-2019: The patient has found out that he does not have prostate cancer and is very happy about this news.  . Assessed patient's education and care coordination needs: 11-26-2019: The patient denies any new concerns at this time. He and his wife went to see their son for a few days recently. They talk to their daughter on the phone regularly and went to their grandson's graduation in May. They stay home a lot but are enjoying retirement.  . Provided disease specific education to patient: education and support on dietary restrictions and evaluation of appetite. The patient states that he is maintaining his weight and he also is  eating well. He does not eat as much as he use to eat.  Nash Dimmer with appropriate clinical care team members regarding patient needs . Evaluation of blood sugar readings. His blood sugars have been ranging 116-170.  He is keeping a better record now. The patient states that he is eating more foods in the air fryer and enjoying this. Education and support. Will send information by my chart for the patient.   . Evaluation of blood pressure readings. The patient states he has not been taking his blood pressure readings. Denies any issues related to blood pressure. Education on the benefits of taking blood pressures regularly.   Patient Self Care Activities related to HTN, HLD, and DMII:  . Patient is unable to independently self-manage chronic health conditions  Please see past updates related to this goal by clicking on the "Past Updates" button in the selected goal          Plan:   Telephone follow up appointment with care management team member scheduled for: 01-21-2020 at 130 pm   Noreene Larsson RN, MSN, [facility] Mobile: [phone]

## 2019-11-26 NOTE — Patient Instructions (Signed)
Visit Information  Goals Addressed            This Visit's Progress   . RNCM: I am having a lot of pain especially in my left knee       Current Barriers:  [name] Knowledge Deficits related to pain control in left knee and other pain due to chronic pain/osteoarthritis . Impaired mobility due to pain in left knee- currently working with physical therapy and it is helping him with pain relief from arthritis   Nurse Case Manager Clinical Goal(s):  [name] Over the next 90 days, patient will verbalize understanding of plan for pain control for chronic pain  . Over the next 90 days, patient will work with pcp and CCM team to address needs related to best options for pain relief in patient with chronic pain issues . Over the next 90 days, patient will demonstrate a decrease in pain  exacerbations as evidenced by pain level being less than 6 on a consistent daily basis . Over the next 90 days, patient will attend all scheduled medical appointments: with orthopedic provider and pcp . Over the next 90 days, patient will demonstrate understanding of rationale for each prescribed medication as evidenced by compliance and taking recommended dosages . Over the next 90 days, patient will work with CM team pharmacist to aide in answering questions and concerns about pain medications and safe administration . Over the next 90 days, the patient will demonstrate ongoing self health care management ability as evidenced by decreased pain level, no falls, and increased mobility  Interventions:  . Evaluation of current treatment plan related to unresolved pain due to arthritis worse in the left knee and patient's adherence to plan as established by provider. Per the patient he is taking prednisone and leflunamide and his pain level is 99% better. 11-26-2019: The patient is taking the leflunamide and still doing well. Has arthritis in his back but he takes a couple of Tylenol and he is good. He can deal with that pain now and  he is happy that the medication is helping. Follows up with the specialist in August. . Reviewed medications with patient and discussed compliance, the patient verbalized he is currently taking prednisone and leflunamide and is having relief from the arthritis pain.  . Discussed plans with patient for ongoing care management follow up and provided patient with direct contact information for care management team . Reviewed scheduled/upcoming provider appointments including: 9/7 with pcp, 8/4 with Dr. Posey Pronto.  for blood work,  and November with Dr. Marca Ancona . Evaluation of new pain concerns or questions. The patient denies any new concerns at this time. The patient feels better than he has in a while. Is happy that his pain is under control. He says he will continue to do what the doctors recommend because he never wants to feel like he did with the pain in his knees, arms, legs and hands.  Patient Self Care Activities:  . Patient verbalizes understanding of arthritis pain and the plan of care for controlling pain . Self administers medications as prescribed . Attends all scheduled provider appointments . Calls provider office for new concerns or questions . Unable to independently control pain due to arthritis without medication therapy and specialist intervention  . Unable to perform IADLs independently (currently having to use a cane when ambulating)- improvement in mobility, working with PT also   Please see past updates related to this goal by clicking on the "Past Updates" button in the selected goal      .  RNCM: I try to take my bp and blood sugar readings daily       Current Barriers:  . Chronic Disease Management support, education, and care coordination needs related to HTN, HLD, and DMII  Clinical Goal(s) related to HTN, HLD, and DMII:  Over the next 90 days, patient will:  . Work with the care management team to address educational, disease management, and care coordination needs   . Begin or continue self health monitoring activities as directed today Measure and record cbg (blood glucose) BID times daily, Measure and record blood pressure 7 times per week, and institue a hearth healthy/ADA diet . Call provider office for new or worsened signs and symptoms Blood glucose findings outside established parameters, Blood pressure findings outside established parameters, and New or worsened symptom related to HTN, HLD, and DM2 . Call care management team with questions or concerns . Verbalize basic understanding of patient centered plan of care established today  Interventions related to HTN, HLD, and DMII:  . Evaluation of current treatment plans and patient's adherence to plan as established by provider . Assessed patient understanding of disease states. The patient has a good understanding of disease processes. The patient has pain under control but is also dealing with possible new diagnosis of prostate cancer- will see specialist on 08-05-2019.  11-26-2019: The patient has found out that he does not have prostate cancer and is very happy about this news.  . Assessed patient's education and care coordination needs: 11-26-2019: The patient denies any new concerns at this time. He and his wife went to see their son for a few days recently. They talk to their daughter on the phone regularly and went to their grandson's graduation in May. They stay home a lot but are enjoying retirement.  . Provided disease specific education to patient: education and support on dietary restrictions and evaluation of appetite. The patient states that he is maintaining his weight and he also is eating well. He does not eat as much as he use to eat.  Nash Dimmer with appropriate clinical care team members regarding patient needs . Evaluation of blood sugar readings. His blood sugars have been ranging 116-170.  He is keeping a better record now. The patient states that he is eating more foods in the air  fryer and enjoying this. Education and support. Will send information by my chart for the patient.   . Evaluation of blood pressure readings. The patient states he has not been taking his blood pressure readings. Denies any issues related to blood pressure. Education on the benefits of taking blood pressures regularly.   Patient Self Care Activities related to HTN, HLD, and DMII:  . Patient is unable to independently self-manage chronic health conditions  Please see past updates related to this goal by clicking on the "Past Updates" button in the selected goal         Patient verbalizes understanding of instructions provided today.   Telephone follow up appointment with care management team member scheduled for: 01-21-2020 at 130pm  [place], MSN, [facility] Mobile: [phone]    DASH Eating Plan DASH stands for "Dietary Approaches to Stop Hypertension." The DASH eating plan is a healthy eating plan that has been shown to reduce high blood pressure (hypertension). It may also reduce your risk for type 2 diabetes, heart disease, and stroke. The DASH eating plan may also help with weight loss. What are  tips for following this plan?  General guidelines  Avoid eating more than 2,300 mg (milligrams) of salt (sodium) a day. If you have hypertension, you may need to reduce your sodium intake to 1,500 mg a day.  Limit alcohol intake to no more than 1 drink a day for nonpregnant women and 2 drinks a day for men. One drink equals 12 oz of beer, 5 oz of wine, or 1 oz of hard liquor.  Work with your health care provider to maintain a healthy body weight or to lose weight. Ask what an ideal weight is for you.  Get at least 30 minutes of exercise that causes your heart to beat faster (aerobic exercise) most days of the week. Activities may include walking, swimming, or biking.  Work with your health care  provider or diet and nutrition specialist (dietitian) to adjust your eating plan to your individual calorie needs. Reading food labels   Check food labels for the amount of sodium per serving. Choose foods with less than 5 percent of the Daily Value of sodium. Generally, foods with less than 300 mg of sodium per serving fit into this eating plan.  To find whole grains, look for the word "whole" as the first word in the ingredient list. Shopping  Buy products labeled as "low-sodium" or "no salt added."  Buy fresh foods. Avoid canned foods and premade or frozen meals. Cooking  Avoid adding salt when cooking. Use salt-free seasonings or herbs instead of table salt or sea salt. Check with your health care provider or pharmacist before using salt substitutes.  Do not fry foods. Cook foods using healthy methods such as baking, boiling, grilling, and broiling instead.  Cook with heart-healthy oils, such as olive, canola, soybean, or sunflower oil. Meal planning  Eat a balanced diet that includes: ? 5 or more servings of fruits and vegetables each day. At each meal, try to fill half of your plate with fruits and vegetables. ? Up to 6-8 servings of whole grains each day. ? Less than 6 oz of lean meat, poultry, or fish each day. A 3-oz serving of meat is about the same size as a deck of cards. One egg equals 1 oz. ? 2 servings of low-fat dairy each day. ? A serving of nuts, seeds, or beans 5 times each week. ? Heart-healthy fats. Healthy fats called Omega-3 fatty acids are found in foods such as flaxseeds and coldwater fish, like sardines, salmon, and mackerel.  Limit how much you eat of the following: ? Canned or prepackaged foods. ? Food that is high in trans fat, such as fried foods. ? Food that is high in saturated fat, such as fatty meat. ? Sweets, desserts, sugary drinks, and other foods with added sugar. ? Full-fat dairy products.  Do not salt foods before eating.  Try to eat at  least 2 vegetarian meals each week.  Eat more home-cooked food and less restaurant, buffet, and fast food.  When eating at a restaurant, ask that your food be prepared with less salt or no salt, if possible. What foods are recommended? The items listed may not be a complete list. Talk with your dietitian about what dietary choices are best for you. Grains Whole-grain or whole-wheat bread. Whole-grain or whole-wheat pasta. Brown rice. [place]. Bulgur. Whole-grain and low-sodium cereals. Pita bread. Low-fat, low-sodium crackers. Whole-wheat flour tortillas. Vegetables Fresh or frozen vegetables (raw, steamed, roasted, or grilled). Low-sodium or reduced-sodium tomato and vegetable juice. Low-sodium or reduced-sodium tomato sauce  and tomato paste. Low-sodium or reduced-sodium canned vegetables. Fruits All fresh, dried, or frozen fruit. Canned fruit in natural juice (without added sugar). Meat and other protein foods Skinless chicken or [place]. Ground chicken or [place]. Pork with fat trimmed off. Fish and seafood. Egg whites. Dried beans, peas, or lentils. Unsalted nuts, nut butters, and seeds. Unsalted canned beans. Lean cuts of beef with fat trimmed off. Low-sodium, lean deli meat. Dairy Low-fat (1%) or fat-free (skim) milk. Fat-free, low-fat, or reduced-fat cheeses. Nonfat, low-sodium ricotta or cottage cheese. Low-fat or nonfat yogurt. Low-fat, low-sodium cheese. Fats and oils Soft margarine without trans fats. Vegetable oil. Low-fat, reduced-fat, or light mayonnaise and salad dressings (reduced-sodium). Canola, safflower, olive, soybean, and sunflower oils. Avocado. Seasoning and other foods Herbs. Spices. Seasoning mixes without salt. Unsalted popcorn and pretzels. Fat-free sweets. What foods are not recommended? The items listed may not be a complete list. Talk with your dietitian about what dietary choices are best for you. Grains Baked goods made with fat, such as croissants,  muffins, or some breads. Dry pasta or rice meal packs. Vegetables Creamed or fried vegetables. Vegetables in a cheese sauce. Regular canned vegetables (not low-sodium or reduced-sodium). Regular canned tomato sauce and paste (not low-sodium or reduced-sodium). Regular tomato and vegetable juice (not low-sodium or reduced-sodium). Angie Fava. Olives. Fruits Canned fruit in a light or heavy syrup. Fried fruit. Fruit in cream or butter sauce. Meat and other protein foods Fatty cuts of meat. Ribs. Fried meat. Berniece Salines. Sausage. Bologna and other processed lunch meats. Salami. Fatback. Hotdogs. Bratwurst. Salted nuts and seeds. Canned beans with added salt. Canned or smoked fish. Whole eggs or egg yolks. Chicken or [place] with skin. Dairy Whole or 2% milk, cream, and half-and-half. Whole or full-fat cream cheese. Whole-fat or sweetened yogurt. Full-fat cheese. Nondairy creamers. Whipped toppings. Processed cheese and cheese spreads. Fats and oils Butter. Stick margarine. Lard. Shortening. Ghee. Bacon fat. Tropical oils, such as coconut, palm kernel, or palm oil. Seasoning and other foods Salted popcorn and pretzels. Onion salt, garlic salt, seasoned salt, table salt, and sea salt. Worcestershire sauce. Tartar sauce. Barbecue sauce. Teriyaki sauce. Soy sauce, including reduced-sodium. Steak sauce. Canned and packaged gravies. Fish sauce. Oyster sauce. Cocktail sauce. Horseradish that you find on the shelf. Ketchup. Mustard. Meat flavorings and tenderizers. Bouillon cubes. Hot sauce and Tabasco sauce. Premade or packaged marinades. Premade or packaged taco seasonings. Relishes. Regular salad dressings. Where to find more information:  National Heart, Lung, and [address]: https://wilson-eaton.com/  American Heart Association: www.heart.org Summary  The DASH eating plan is a healthy eating plan that has been shown to reduce high blood pressure (hypertension). It may also reduce your risk for type 2 diabetes,  heart disease, and stroke.  With the DASH eating plan, you should limit salt (sodium) intake to 2,300 mg a day. If you have hypertension, you may need to reduce your sodium intake to 1,500 mg a day.  When on the DASH eating plan, aim to eat more fresh fruits and vegetables, whole grains, lean proteins, low-fat dairy, and heart-healthy fats.  Work with your health care provider or diet and nutrition specialist (dietitian) to adjust your eating plan to your individual calorie needs. This information is not intended to replace advice given to you by your health care provider. Make sure you discuss any questions you have with your health care provider. Document Revised: 04/04/2017 Document Reviewed: 04/15/2016 Elsevier Patient Education  [place].  Diabetes Mellitus and Nutrition, Adult When you have diabetes (  diabetes mellitus), it is very important to have healthy eating habits because your blood sugar (glucose) levels are greatly affected by what you eat and drink. Eating healthy foods in the appropriate amounts, at about the same times every day, can help you:  Control your blood glucose.  Lower your risk of heart disease.  Improve your blood pressure.  Reach or maintain a healthy weight. Every person with diabetes is different, and each person has different needs for a meal plan. Your health care provider may recommend that you work with a diet and nutrition specialist (dietitian) to make a meal plan that is best for you. Your meal plan may vary depending on factors such as:  The calories you need.  The medicines you take.  Your weight.  Your blood glucose, blood pressure, and cholesterol levels.  Your activity level.  Other health conditions you have, such as heart or kidney disease. How do carbohydrates affect me? Carbohydrates, also called carbs, affect your blood glucose level more than any other type of food. Eating carbs naturally raises the amount of glucose in  your blood. Carb counting is a method for keeping track of how many carbs you eat. Counting carbs is important to keep your blood glucose at a healthy level, especially if you use insulin or take certain oral diabetes medicines. It is important to know how many carbs you can safely have in each meal. This is different for every person. Your dietitian can help you calculate how many carbs you should have at each meal and for each snack. Foods that contain carbs include:  Bread, cereal, rice, pasta, and crackers.  Potatoes and corn.  Peas, beans, and lentils.  Milk and yogurt.  Fruit and juice.  Desserts, such as cakes, cookies, ice cream, and candy. How does alcohol affect me? Alcohol can cause a sudden decrease in blood glucose (hypoglycemia), especially if you use insulin or take certain oral diabetes medicines. Hypoglycemia can be a life-threatening condition. Symptoms of hypoglycemia (sleepiness, dizziness, and confusion) are similar to symptoms of having too much alcohol. If your health care provider says that alcohol is safe for you, follow these guidelines:  Limit alcohol intake to no more than 1 drink per day for nonpregnant women and 2 drinks per day for men. One drink equals 12 oz of beer, 5 oz of wine, or 1 oz of hard liquor.  Do not drink on an empty stomach.  Keep yourself hydrated with water, diet soda, or unsweetened iced tea.  Keep in mind that regular soda, juice, and other mixers may contain a lot of sugar and must be counted as carbs. What are tips for following this plan?  Reading food labels  Start by checking the serving size on the "Nutrition Facts" label of packaged foods and drinks. The amount of calories, carbs, fats, and other nutrients listed on the label is based on one serving of the item. Many items contain more than one serving per package.  Check the total grams (g) of carbs in one serving. You can calculate the number of servings of carbs in one  serving by dividing the total carbs by 15. For example, if a food has 30 g of total carbs, it would be equal to 2 servings of carbs.  Check the number of grams (g) of saturated and trans fats in one serving. Choose foods that have low or no amount of these fats.  Check the number of milligrams (mg) of salt (sodium) in one serving.  Most people should limit total sodium intake to less than 2,300 mg per day.  Always check the nutrition information of foods labeled as "low-fat" or "nonfat". These foods may be higher in added sugar or refined carbs and should be avoided.  Talk to your dietitian to identify your daily goals for nutrients listed on the label. Shopping  Avoid buying canned, premade, or processed foods. These foods tend to be high in fat, sodium, and added sugar.  Shop around the outside edge of the grocery store. This includes fresh fruits and vegetables, bulk grains, fresh meats, and fresh dairy. Cooking  Use low-heat cooking methods, such as baking, instead of high-heat cooking methods like deep frying.  Cook using healthy oils, such as olive, canola, or sunflower oil.  Avoid cooking with butter, cream, or high-fat meats. Meal planning  Eat meals and snacks regularly, preferably at the same times every day. Avoid going long periods of time without eating.  Eat foods high in fiber, such as fresh fruits, vegetables, beans, and whole grains. Talk to your dietitian about how many servings of carbs you can eat at each meal.  Eat 4-6 ounces (oz) of lean protein each day, such as lean meat, chicken, fish, eggs, or tofu. One oz of lean protein is equal to: ? 1 oz of meat, chicken, or fish. ? 1 egg. ?  cup of tofu.  Eat some foods each day that contain healthy fats, such as avocado, nuts, seeds, and fish. Lifestyle  Check your blood glucose regularly.  Exercise regularly as told by your health care provider. This may include: ? 150 minutes of moderate-intensity or  vigorous-intensity exercise each week. This could be brisk walking, biking, or water aerobics. ? Stretching and doing strength exercises, such as yoga or weightlifting, at least 2 times a week.  Take medicines as told by your health care provider.  Do not use any products that contain nicotine or tobacco, such as cigarettes and e-cigarettes. If you need help quitting, ask your health care provider.  Work with a Social worker or diabetes educator to identify strategies to manage stress and any emotional and social challenges. Questions to ask a health care provider  Do I need to meet with a diabetes educator?  Do I need to meet with a dietitian?  What number can I call if I have questions?  When are the best times to check my blood glucose? Where to find more information:  American Diabetes Association: diabetes.org  Academy of Nutrition and Dietetics: www.eatright.[employer] of Diabetes and Digestive and Kidney Diseases (NIH): [employer] Summary  A healthy meal plan will help you control your blood glucose and maintain a healthy lifestyle.  Working with a diet and nutrition specialist (dietitian) can help you make a meal plan that is best for you.  Keep in mind that carbohydrates (carbs) and alcohol have immediate effects on your blood glucose levels. It is important to count carbs and to use alcohol carefully. This information is not intended to replace advice given to you by your health care provider. Make sure you discuss any questions you have with your health care provider. Document Revised: 04/04/2017 Document Reviewed: 05/27/2016 Elsevier Patient Education  2020 [employer].

## 2019-12-08 DIAGNOSIS — M0609 Rheumatoid arthritis without rheumatoid factor, multiple sites: Secondary | ICD-10-CM | POA: Diagnosis not present

## 2019-12-08 DIAGNOSIS — M8949 Other hypertrophic osteoarthropathy, multiple sites: Secondary | ICD-10-CM | POA: Diagnosis not present

## 2019-12-08 DIAGNOSIS — Z79899 Other long term (current) drug therapy: Secondary | ICD-10-CM | POA: Diagnosis not present

## 2020-01-11 ENCOUNTER — Ambulatory Visit: Payer: Medicare Other | Admitting: Nurse Practitioner

## 2020-01-12 ENCOUNTER — Other Ambulatory Visit: Payer: Self-pay

## 2020-01-12 DIAGNOSIS — E785 Hyperlipidemia, unspecified: Secondary | ICD-10-CM

## 2020-01-12 DIAGNOSIS — I10 Essential (primary) hypertension: Secondary | ICD-10-CM

## 2020-01-12 MED ORDER — TAMSULOSIN HCL 0.4 MG PO CAPS: 0.4000 mg | ORAL_CAPSULE | Freq: Every day | ORAL | 4 refills | Status: DC

## 2020-01-12 MED ORDER — ATORVASTATIN CALCIUM 20 MG PO TABS: ORAL_TABLET | ORAL | 4 refills | Status: DC

## 2020-01-12 MED ORDER — CARVEDILOL 6.25 MG PO TABS: 6.2500 mg | ORAL_TABLET | Freq: Two times a day (BID) | ORAL | 4 refills | Status: DC

## 2020-01-12 NOTE — Telephone Encounter (Signed)
Patient last seen 10/11/19 and has appointment in October.

## 2020-01-18 ENCOUNTER — Telehealth: Payer: Self-pay | Admitting: Nurse Practitioner

## 2020-01-18 NOTE — Telephone Encounter (Signed)
Copied from [place] [id number]. Topic: Medicare AWV >> Jan 18, 2020 10:29 AM Cher Nakai R wrote: Reason for CRM:  Left message for patient to call back and schedule the Medicare Annual Wellness Visit (AWV) virtually.  Last AWV 01/21/2019  Please schedule at anytime with [facility]-Nurse Health Advisor.  45 minute appointment  Any questions, please call me at [phone]

## 2020-01-21 ENCOUNTER — Telehealth: Payer: Self-pay

## 2020-01-31 ENCOUNTER — Ambulatory Visit (INDEPENDENT_AMBULATORY_CARE_PROVIDER_SITE_OTHER): Payer: Medicare Other

## 2020-01-31 VITALS — Ht 70.0 in | Wt 208.0 lb

## 2020-01-31 DIAGNOSIS — Z0000 Encounter for general adult medical examination without abnormal findings: Secondary | ICD-10-CM

## 2020-01-31 NOTE — Patient Instructions (Signed)
[name] , Thank you for taking time to come for your Medicare Wellness Visit. I appreciate your ongoing commitment to your health goals. Please review the following plan we discussed and let me know if I can assist you in the future.   Screening recommendations/referrals: Colonoscopy: not required Recommended yearly ophthalmology/optometry visit for glaucoma screening and checkup Recommended yearly dental visit for hygiene and checkup  Vaccinations: Influenza vaccine: due Pneumococcal vaccine: completed 10/11/2019 Tdap vaccine: due Shingles vaccine: discussed   Covid-19:  06/05/2019, 05/12/2019  Advanced directives: Please bring a copy of your POA (Power of Attorney) and/or Living Will to your next appointment.   Conditions/risks identified: none  Next appointment: Follow up in one year for your annual wellness visit.   Preventive Care 77 Years and Older, Male Preventive care refers to lifestyle choices and visits with your health care provider that can promote health and wellness. What does preventive care include?  A yearly physical exam. This is also called an annual well check.  Dental exams once or twice a year.  Routine eye exams. Ask your health care provider how often you should have your eyes checked.  Personal lifestyle choices, including:  Daily care of your teeth and gums.  Regular physical activity.  Eating a healthy diet.  Avoiding tobacco and drug use.  Limiting alcohol use.  Practicing safe sex.  Taking low doses of aspirin every day.  Taking vitamin and mineral supplements as recommended by your health care provider. What happens during an annual well check? The services and screenings done by your health care provider during your annual well check will depend on your age, overall health, lifestyle risk factors, and family history of disease. Counseling  Your health care provider may ask you questions about your:  Alcohol use.  Tobacco  use.  Drug use.  Emotional well-being.  Home and relationship well-being.  Sexual activity.  Eating habits.  History of falls.  Memory and ability to understand (cognition).  Work and work Statistician. Screening  You may have the following tests or measurements:  Height, weight, and BMI.  Blood pressure.  Lipid and cholesterol levels. These may be checked every 5 years, or more frequently if you are over 77 years old.  Skin check.  Lung cancer screening. You may have this screening every year starting at age 77 if you have a 30-pack-year history of smoking and currently smoke or have quit within the past 15 years.  Fecal occult blood test (FOBT) of the stool. You may have this test every year starting at age 77.  Flexible sigmoidoscopy or colonoscopy. You may have a sigmoidoscopy every 5 years or a colonoscopy every 10 years starting at age 77.  Prostate cancer screening. Recommendations will vary depending on your family history and other risks.  Hepatitis C blood test.  Hepatitis B blood test.  Sexually transmitted disease (STD) testing.  Diabetes screening. This is done by checking your blood sugar (glucose) after you have not eaten for a while (fasting). You may have this done every 1-3 years.  Abdominal aortic aneurysm (AAA) screening. You may need this if you are a current or former smoker.  Osteoporosis. You may be screened starting at age 77 if you are at high risk. Talk with your health care provider about your test results, treatment options, and if necessary, the need for more tests. Vaccines  Your health care provider may recommend certain vaccines, such as:  Influenza vaccine. This is recommended every year.  Tetanus, diphtheria,  and acellular pertussis (Tdap, Td) vaccine. You may need a Td booster every 10 years.  Zoster vaccine. You may need this after age 77.  Pneumococcal 13-valent conjugate (PCV13) vaccine. One dose is recommended after age  77.  Pneumococcal polysaccharide (PPSV23) vaccine. One dose is recommended after age 77. Talk to your health care provider about which screenings and vaccines you need and how often you need them. This information is not intended to replace advice given to you by your health care provider. Make sure you discuss any questions you have with your health care provider. Document Released: 05/19/2015 Document Revised: 01/10/2016 Document Reviewed: 02/21/2015 Elsevier Interactive Patient Education  2017 [employer] Prevention in the Home Falls can cause injuries. They can happen to people of all ages. There are many things you can do to make your home safe and to help prevent falls. What can I do on the outside of my home?  Regularly fix the edges of walkways and driveways and fix any cracks.  Remove anything that might make you trip as you walk through a door, such as a raised step or threshold.  Trim any bushes or trees on the path to your home.  Use bright outdoor lighting.  Clear any walking paths of anything that might make someone trip, such as rocks or tools.  Regularly check to see if handrails are loose or broken. Make sure that both sides of any steps have handrails.  Any raised decks and porches should have guardrails on the edges.  Have any leaves, snow, or ice cleared regularly.  Use sand or salt on walking paths during winter.  Clean up any spills in your garage right away. This includes oil or grease spills. What can I do in the bathroom?  Use night lights.  Install grab bars by the toilet and in the tub and shower. Do not use towel bars as grab bars.  Use non-skid mats or decals in the tub or shower.  If you need to sit down in the shower, use a plastic, non-slip stool.  Keep the floor dry. Clean up any water that spills on the floor as soon as it happens.  Remove soap buildup in the tub or shower regularly.  Attach bath mats securely with double-sided  non-slip rug tape.  Do not have throw rugs and other things on the floor that can make you trip. What can I do in the bedroom?  Use night lights.  Make sure that you have a light by your bed that is easy to reach.  Do not use any sheets or blankets that are too big for your bed. They should not hang down onto the floor.  Have a firm chair that has side arms. You can use this for support while you get dressed.  Do not have throw rugs and other things on the floor that can make you trip. What can I do in the kitchen?  Clean up any spills right away.  Avoid walking on wet floors.  Keep items that you use a lot in easy-to-reach places.  If you need to reach something above you, use a strong step stool that has a grab bar.  Keep electrical cords out of the way.  Do not use floor polish or wax that makes floors slippery. If you must use wax, use non-skid floor wax.  Do not have throw rugs and other things on the floor that can make you trip. What can I do with my  stairs?  Do not leave any items on the stairs.  Make sure that there are handrails on both sides of the stairs and use them. Fix handrails that are broken or loose. Make sure that handrails are as long as the stairways.  Check any carpeting to make sure that it is firmly attached to the stairs. Fix any carpet that is loose or worn.  Avoid having throw rugs at the top or bottom of the stairs. If you do have throw rugs, attach them to the floor with carpet tape.  Make sure that you have a light switch at the top of the stairs and the bottom of the stairs. If you do not have them, ask someone to add them for you. What else can I do to help prevent falls?  Wear shoes that:  Do not have high heels.  Have rubber bottoms.  Are comfortable and fit you well.  Are closed at the toe. Do not wear sandals.  If you use a stepladder:  Make sure that it is fully opened. Do not climb a closed stepladder.  Make sure that both  sides of the stepladder are locked into place.  Ask someone to hold it for you, if possible.  Clearly mark and make sure that you can see:  Any grab bars or handrails.  First and last steps.  Where the edge of each step is.  Use tools that help you move around (mobility aids) if they are needed. These include:  Canes.  Walkers.  Scooters.  Crutches.  Turn on the lights when you go into a dark area. Replace any light bulbs as soon as they burn out.  Set up your furniture so you have a clear path. Avoid moving your furniture around.  If any of your floors are uneven, fix them.  If there are any pets around you, be aware of where they are.  Review your medicines with your doctor. Some medicines can make you feel dizzy. This can increase your chance of falling. Ask your doctor what other things that you can do to help prevent falls. This information is not intended to replace advice given to you by your health care provider. Make sure you discuss any questions you have with your health care provider. Document Released: 02/16/2009 Document Revised: 09/28/2015 Document Reviewed: 05/27/2014 Elsevier Interactive Patient Education  2017 [employer].

## 2020-01-31 NOTE — Progress Notes (Signed)
I connected with Jason Contreras today by telephone and verified that I am speaking with the correct person using two identifiers. Location patient: home Location provider: work Persons participating in the virtual visit: Jason Contreras, Glenna Durand LPN.   I discussed the limitations, risks, security and privacy concerns of performing an evaluation and management service by telephone and the availability of in person appointments. I also discussed with the patient that there may be a patient responsible charge related to this service. The patient expressed understanding and verbally consented to this telephonic visit.    Interactive audio and video telecommunications were attempted between this provider and patient, however failed, due to patient having technical difficulties OR patient did not have access to video capability.  We continued and completed visit with audio only.     Vital signs may be patient reported or missing.  Subjective:   Jason Contreras is a 79 y.o. male who presents for Medicare Annual/Subsequent preventive examination.  Review of Systems     Cardiac Risk Factors include: advanced age (>33men, >26 women);diabetes mellitus;dyslipidemia;hypertension;male gender;sedentary lifestyle     Objective:    Today's Vitals   01/31/20 1256  Weight: 208 lb (94.3 kg)  Height: 5\' 10"  (1.778 m)   Body mass index is 29.84 kg/m.  Advanced Directives 01/31/2020 01/21/2019 01/08/2018 10/17/2017 10/17/2017 10/14/2017 01/01/2017  Does Patient Have a Medical Advance Directive? Yes Yes Yes No No Yes Yes  Type of Paramedic of [place];Living will Living will;Healthcare Power of [place];Living will - - - [employer];Living will  Copy of [place] in Chart? No - copy requested No - copy requested No - copy requested - - - No - copy requested  Would patient like information on creating a medical advance  directive? - - - No - Patient declined No - Patient declined - -    Current Medications (verified) Outpatient Encounter Medications as of 01/31/2020  Medication Sig  . atorvastatin (LIPITOR) 20 MG tablet TAKE 1 TABLET BY MOUTH  DAILY AT 6 PM.  . carvedilol (COREG) 6.25 MG tablet Take 1 tablet (6.25 mg total) by mouth 2 (two) times daily.  . cholecalciferol (VITAMIN D3) 25 MCG (1000 UNIT) tablet Take 2,000 Units by mouth daily.   . DULoxetine (CYMBALTA) 60 MG capsule Take 1 capsule (60 mg total) by mouth daily.  . finasteride (PROSCAR) 5 MG tablet Take 1 tablet (5 mg total) by mouth daily.  [name] gabapentin (NEURONTIN) 600 MG tablet Take 1 tablet (600 mg total) by mouth 3 (three) times daily.  [name] leflunomide (ARAVA) 20 MG tablet Take 20 mg by mouth daily.  [name] lidocaine (LIDODERM) 5 % Place 1 patch onto the skin daily. Remove & Discard patch within 12 hours or as directed by MD  . metFORMIN (GLUCOPHAGE) 500 MG tablet TAKE 2 TABLETS BY MOUTH TWO TIMES DAILY  . mometasone (ELOCON) 0.1 % cream APPLY  CREAM TOPICALLY ONCE DAILY  . tamsulosin (FLOMAX) 0.4 MG CAPS capsule Take 1 capsule (0.4 mg total) by mouth daily.  . vitamin B-12 (CYANOCOBALAMIN) 1000 MCG tablet Take 1,000 mcg by mouth daily.   No facility-administered encounter medications on file as of 01/31/2020.    Allergies (verified) Morphine and Morphine and related   History: Past Medical History:  Diagnosis Date  . Adenocarcinoma, renal cell ([place])   . Arthritis   . Collagen vascular disease ([place])   . Diabetes mellitus without complication ([place])   .  Elevated PSA   . Headache   . Hematuria   . Hyperlipidemia   . Hypertension   . Neuropathy   . Obesity   . PONV (postoperative nausea and vomiting)   . Renal insufficiency   . Right renal mass    Past Surgical History:  Procedure Laterality Date  . APPENDECTOMY    . CRYOABLATION  10/17/2017  . IR RADIOLOGIST EVAL & MGMT  07/17/2017  . IR RADIOLOGIST EVAL & MGMT  08/13/2017  . IR  RADIOLOGIST EVAL & MGMT  11/12/2017  . IR RADIOLOGIST EVAL & MGMT  03/05/2018  . RADIOLOGY WITH ANESTHESIA Right 10/17/2017   Procedure: CT WITH ANESTHESIA RENAL CRYOABLATION;  Surgeon: [name], MD;  Location: [facility];  Service: Anesthesiology;  Laterality: Right;  . ROBOTIC ASSITED PARTIAL NEPHRECTOMY Right 12/12/2015   Procedure: ROBOTIC ASSITED PARTIAL NEPHRECTOMY;  Surgeon: [name], MD;  Location: [facility] ORS;  Service: Urology;  Laterality: Right;   Family History  Problem Relation Age of Onset  . Bone cancer Mother   . Cancer Mother   . Cancer Father   . Stroke Brother   . Kidney disease Neg Hx    Social History   Socioeconomic History  . Marital status: Married    Spouse name: Not on file  . Number of children: Not on file  . Years of education: 3 years college   . Highest education level: Some college, no degree  Occupational History  . Not on file  Tobacco Use  . Smoking status: Never Smoker  . Smokeless tobacco: Never Used  Vaping Use  . Vaping Use: Never used  Substance and Sexual Activity  . Alcohol use: No  . Drug use: No  . Sexual activity: Not Currently  Other Topics Concern  . Not on file  Social History Narrative  . Not on file   Social Determinants of Health   Financial Resource Strain: Low Risk   . Difficulty of Paying Living Expenses: Not hard at all  Food Insecurity: No Food Insecurity  . Worried About Charity fundraiser in the Last Year: Never true  . Ran Out of Food in the Last Year: Never true  Transportation Needs: No Transportation Needs  . Lack of Transportation (Medical): No  . Lack of Transportation (Non-Medical): No  Physical Activity: Inactive  . Days of Exercise per Week: 0 days  . Minutes of Exercise per Session: 0 min  Stress: No Stress Concern Present  . Feeling of Stress : Not at all  Social Connections:   . Frequency of Communication with Friends and Family: Not on file  . Frequency of Social Gatherings with Friends  and Family: Not on file  . Attends Religious Services: Not on file  . Active Member of Clubs or Organizations: Not on file  . Attends Archivist Meetings: Not on file  . Marital Status: Not on file    Tobacco Counseling Counseling given: Not Answered   Clinical Intake:  Pre-visit preparation completed: Yes  Pain : No/denies pain     Nutritional Status: BMI 25 -29 Overweight Nutritional Risks: Nausea/ vomitting/ diarrhea (diarrhea due to medication) Diabetes: Yes  How often do you need to have someone help you when you read instructions, pamphlets, or other written materials from your doctor or pharmacy?: 1 - Never What is the last grade level you completed in school?: community college  Diabetic? Yes Nutrition Risk Assessment:  Has the patient had any N/V/D within the last 2 months?  Yes  Does the patient have any non-healing wounds?  No  Has the patient had any unintentional weight loss or weight gain?  No   Diabetes:  Is the patient diabetic?  Yes  If diabetic, was a CBG obtained today?  No  Did the patient bring in their glucometer from home?  No  How often do you monitor your CBG's? M-F  Financial Strains and Diabetes Management:  Are you having any financial strains with the device, your supplies or your medication? No .  Does the patient want to be seen by Chronic Care Management for management of their diabetes?  No  Would the patient like to be referred to a Nutritionist or for Diabetic Management?  No   Diabetic Exams:  Diabetic Eye Exam: Overdue for diabetic eye exam. Pt has been advised about the importance in completing this exam. Patient advised to call and schedule an eye exam. Diabetic Foot Exam: Completed 10/11/2019   Interpreter Needed?: No  Information entered by :: NAllen LPN   Activities of Daily Living In your present state of health, do you have any difficulty performing the following activities: 01/31/2020 05/28/2019  Hearing? N  N  Vision? N Y  Difficulty concentrating or making decisions? N N  Walking or climbing stairs? N Y  Dressing or bathing? N N  Doing errands, shopping? N N  Preparing Food and eating ? N -  Using the Toilet? N -  In the past six months, have you accidently leaked urine? Y -  Do you have problems with loss of bowel control? Y -  Comment a couple times due to diarrhea -  Managing your Medications? N -  Managing your Finances? N -  Housekeeping or managing your Housekeeping? N -  Some recent data might be hidden    Patient Care Team: Venita Lick, NP as PCP - General (Nurse Practitioner) [name], MD as Consulting Physician (Urology) [name], MD as Consulting Physician (Interventional Radiology) Vanita Ingles, RN as Case Manager (General Practice)  Indicate any recent Medical Services you may have received from other than [facility] providers in the past year (date may be approximate).     Assessment:   This is a routine wellness examination for [facility].  Hearing/Vision screen  Hearing Screening   125Hz  250Hz  500Hz  1000Hz  2000Hz  3000Hz  4000Hz  6000Hz  8000Hz   Right ear:           Left ear:           Vision Screening Comments: No regular eye exams, [facility]  Dietary issues and exercise activities discussed: Current Exercise Habits: The patient does not participate in regular exercise at present  Goals    .  Increase water intake      Recommend drinking at least 6-8 glasses of water a day    .  Patient Stated      01/31/2020, staying alive    .  PharmD "His pain is terrible" (pt-stated)      CARE PLAN ENTRY (see longtitudinal plan of care for additional care plan information)  Current Barriers:  . Polypharmacy; complex patient with multiple comorbidities including HTN, HLD, arthritis, T2DM . Self-manages medications. Daughter, Bernerd Limbo, is very involved in his care . Per chart review, previous [facility] pharmacist assisted in application for Medicare Extra  Help. o Arthritis: follows w/ rheumatology. Significant symptomatic improvement on leflunomide 20 mg daily. Reports some occasional diarrhea, but notes that the RA improvement is "worth it" o Chronic pain: duloxetine  60 mg daily, gabapentin 600 mg TID o T2DM: A1c 7.5%, Metformin 1000 mg BID - Recently got a new glucometer. Plans to start checking readings tomorrow.  o HTN: carvedilol 6.25 mg BID; not checking BP at home. Wife notes they need a new BP machine o ASCVD risk reduction: atorvastatin 20 mg daily; last LDL at goal <70 o BPH : Appt w/ urology yesterday. PSA decreased w/ finsteride tx, so current thought is that malignancy is not a concern. Patient notes some urologic symptom improvement w/ finasteride + tamsulosin.  o Supplementation: vitamin B12 1000 mcg daily, Vitamin D3 2000 units daily- recently increased per PCP recommendation o Anemia: patient wonders if he needs to be taking iron supplementation.   Pharmacist Clinical Goal(s):  [name] Over the next 90 days, patient will work with PharmD and provider towards optimized medication management  Interventions: . Comprehensive medication review performed, medication list updated in electronic medical record . Inter-disciplinary care team collaboration (see longitudinal plan of care) . Reviewed goal A1c, goal fasting, and goal 2 hour post prandial glucose. Encouraged patient to start checking occasional fastings to evaluate glucose control. Discussed that prednisone course could have impacted most recent A1c . Educated patient's wife to investigate BP machine through OTC benefits through [facility].  . Praised on good news regarding PSA. Encouraged continued follow up . Educated on macro vs microcytic anemia, reviewed recent lab work from [facility] showing normocytic anemia. Educated patient.  Patient Self Care Activities:  . Patient will take medications as prescribed  Please see past updates related to this goal by clicking on the "Past Updates"  button in the selected goal      .  RNCM: I am having a lot of pain especially in my left knee      Current Barriers:  [name] Knowledge Deficits related to pain control in left knee and other pain due to chronic pain/osteoarthritis . Impaired mobility due to pain in left knee- currently working with physical therapy and it is helping him with pain relief from arthritis   Nurse Case Manager Clinical Goal(s):  [name] Over the next 90 days, patient will verbalize understanding of plan for pain control for chronic pain  . Over the next 90 days, patient will work with pcp and CCM team to address needs related to best options for pain relief in patient with chronic pain issues . Over the next 90 days, patient will demonstrate a decrease in pain  exacerbations as evidenced by pain level being less than 6 on a consistent daily basis . Over the next 90 days, patient will attend all scheduled medical appointments: with orthopedic provider and pcp . Over the next 90 days, patient will demonstrate understanding of rationale for each prescribed medication as evidenced by compliance and taking recommended dosages . Over the next 90 days, patient will work with CM team pharmacist to aide in answering questions and concerns about pain medications and safe administration . Over the next 90 days, the patient will demonstrate ongoing self health care management ability as evidenced by decreased pain level, no falls, and increased mobility  Interventions:  . Evaluation of current treatment plan related to unresolved pain due to arthritis worse in the left knee and patient's adherence to plan as established by provider. Per the patient he is taking prednisone and leflunamide and his pain level is 99% better. 11-26-2019: The patient is taking the leflunamide and still doing well. Has arthritis in his back but he takes a couple of Tylenol and  he is good. He can deal with that pain now and he is happy that the medication is helping.  Follows up with the specialist in August. . Reviewed medications with patient and discussed compliance, the patient verbalized he is currently taking prednisone and leflunamide and is having relief from the arthritis pain.  . Discussed plans with patient for ongoing care management follow up and provided patient with direct contact information for care management team . Reviewed scheduled/upcoming provider appointments including: 9/7 with pcp, 8/4 with Dr. Posey Pronto.  for blood work,  and November with Dr. Marca Ancona . Evaluation of new pain concerns or questions. The patient denies any new concerns at this time. The patient feels better than he has in a while. Is happy that his pain is under control. He says he will continue to do what the doctors recommend because he never wants to feel like he did with the pain in his knees, arms, legs and hands.  Patient Self Care Activities:  . Patient verbalizes understanding of arthritis pain and the plan of care for controlling pain . Self administers medications as prescribed . Attends all scheduled provider appointments . Calls provider office for new concerns or questions . Unable to independently control pain due to arthritis without medication therapy and specialist intervention  . Unable to perform IADLs independently (currently having to use a cane when ambulating)- improvement in mobility, working with PT also   Please see past updates related to this goal by clicking on the "Past Updates" button in the selected goal      .  RNCM: I try to take my bp and blood sugar readings daily      Current Barriers:  . Chronic Disease Management support, education, and care coordination needs related to HTN, HLD, and DMII  Clinical Goal(s) related to HTN, HLD, and DMII:  Over the next 90 days, patient will:  . Work with the care management team to address educational, disease management, and care coordination needs  . Begin or continue self health monitoring  activities as directed today Measure and record cbg (blood glucose) BID times daily, Measure and record blood pressure 7 times per week, and institue a hearth healthy/ADA diet . Call provider office for new or worsened signs and symptoms Blood glucose findings outside established parameters, Blood pressure findings outside established parameters, and New or worsened symptom related to HTN, HLD, and DM2 . Call care management team with questions or concerns . Verbalize basic understanding of patient centered plan of care established today  Interventions related to HTN, HLD, and DMII:  . Evaluation of current treatment plans and patient's adherence to plan as established by provider . Assessed patient understanding of disease states. The patient has a good understanding of disease processes. The patient has pain under control but is also dealing with possible new diagnosis of prostate cancer- will see specialist on 08-05-2019.  11-26-2019: The patient has found out that he does not have prostate cancer and is very happy about this news.  . Assessed patient's education and care coordination needs: 11-26-2019: The patient denies any new concerns at this time. He and his wife went to see their son for a few days recently. They talk to their daughter on the phone regularly and went to their grandson's graduation in May. They stay home a lot but are enjoying retirement.  . Provided disease specific education to patient: education and support on dietary restrictions and evaluation of appetite. The patient states that he is maintaining  his weight and he also is eating well. He does not eat as much as he use to eat.  Nash Dimmer with appropriate clinical care team members regarding patient needs . Evaluation of blood sugar readings. His blood sugars have been ranging 116-170.  He is keeping a better record now. The patient states that he is eating more foods in the air fryer and enjoying this. Education and support.  Will send information by my chart for the patient.   . Evaluation of blood pressure readings. The patient states he has not been taking his blood pressure readings. Denies any issues related to blood pressure. Education on the benefits of taking blood pressures regularly.   Patient Self Care Activities related to HTN, HLD, and DMII:  . Patient is unable to independently self-manage chronic health conditions  Please see past updates related to this goal by clicking on the "Past Updates" button in the selected goal        Depression Screen PHQ 2/9 Scores 01/31/2020 06/11/2019 05/28/2019 01/21/2019 01/08/2018 11/04/2017 01/01/2017  PHQ - 2 Score 0 0 0 0 0 0 0  PHQ- 9 Score - - 6 - - - -    Fall Risk Fall Risk  01/31/2020 06/11/2019 01/21/2019 01/08/2018 11/04/2017  Falls in the past year? 0 0 0 No No  Number falls in past yr: - 0 - - -  Injury with Fall? - 0 - - -  Risk for fall due to : Medication side effect Impaired mobility;Orthopedic patient - - -  Follow up Falls evaluation completed;Education provided;Falls prevention discussed Falls evaluation completed;Education provided;Falls prevention discussed - - -    Any stairs in or around the home? Yes  If so, are there any without handrails? No  Home free of loose throw rugs in walkways, pet beds, electrical cords, etc? Yes  Adequate lighting in your home to reduce risk of falls? Yes   ASSISTIVE DEVICES UTILIZED TO PREVENT FALLS:  Life alert? No  Use of a cane, walker or w/c? No  Grab bars in the bathroom? Yes  Shower chair or bench in shower? Yes  Elevated toilet seat or a handicapped toilet? No   TIMED UP AND GO:  Was the test performed? No .    Cognitive Function:     6CIT Screen 01/31/2020 01/08/2018 01/01/2017  What Year? 0 points 0 points 0 points  What month? 0 points 0 points 0 points  What time? 0 points 0 points 0 points  Count back from 20 0 points 0 points 0 points  Months in reverse 0 points 0 points 0 points  Repeat phrase 0  points 0 points 0 points  Total Score 0 0 0    Immunizations Immunization History  Administered Date(s) Administered  . Fluad Quad(high Dose 65+) 01/25/2019  . Influenza, High Dose Seasonal PF 03/25/2016, 01/13/2017, 03/12/2018  . Influenza,inj,Quad PF,6+ Mos 03/13/2015  . Influenza-Unspecified 04/18/2014  . PFIZER SARS-COV-2 Vaccination 05/12/2019, 06/05/2019  . Pneumococcal Conjugate-13 10/19/2013  . Pneumococcal Polysaccharide-23 09/03/1997, 02/26/2005, 10/10/2019, 10/11/2019  . Td 02/17/2008    TDAP status: Up to date Flu Vaccine status: Up to date Pneumococcal vaccine status: Up to date Covid-19 vaccine status: Completed vaccines  Qualifies for Shingles Vaccine? Yes   Zostavax completed No   Shingrix Completed?: No.    Education has been provided regarding the importance of this vaccine. Patient has been advised to call insurance company to determine out of pocket expense if they have not yet received this vaccine.  Advised may also receive vaccine at local pharmacy or Health Dept. Verbalized acceptance and understanding.  Screening Tests Health Maintenance  Topic Date Due  . OPHTHALMOLOGY EXAM  05/03/2017  . INFLUENZA VACCINE  12/05/2019  . TETANUS/TDAP  05/27/2020 (Originally 02/16/2018)  . HEMOGLOBIN A1C  04/11/2020  . URINE MICROALBUMIN  05/27/2020  . FOOT EXAM  10/10/2020  . COVID-19 Vaccine  Completed  . Hepatitis C Screening  Completed  . PNA vac Low Risk Adult  Completed    Health Maintenance  Health Maintenance Due  Topic Date Due  . OPHTHALMOLOGY EXAM  05/03/2017  . INFLUENZA VACCINE  12/05/2019    Colorectal cancer screening: No longer required.   Lung Cancer Screening: (Low Dose CT Chest recommended if Age 18-80 years, 30 pack-year currently smoking OR have quit w/in 15years.) does not qualify.   Lung Cancer Screening Referral: no  Additional Screening:  Hepatitis C Screening: does qualify; Completed 10/11/2019  Vision Screening: Recommended  annual ophthalmology exams for early detection of glaucoma and other disorders of the eye. Is the patient up to date with their annual eye exam?  No  Who is the provider or what is the name of the office in which the patient attends annual eye exams? [facility] If pt is not established with a provider, would they like to be referred to a provider to establish care? No .   Dental Screening: Recommended annual dental exams for proper oral hygiene  Community Resource Referral / Chronic Care Management: CRR required this visit?  No   CCM required this visit?  No      Plan:     I have personally reviewed and noted the following in the patient's chart:   . Medical and social history . Use of alcohol, tobacco or illicit drugs  . Current medications and supplements . Functional ability and status . Nutritional status . Physical activity . Advanced directives . List of other physicians . Hospitalizations, surgeries, and ER visits in previous 12 months . Vitals . Screenings to include cognitive, depression, and falls . Referrals and appointments  In addition, I have reviewed and discussed with patient certain preventive protocols, quality metrics, and best practice recommendations. A written personalized care plan for preventive services as well as general preventive health recommendations were provided to patient.     Kellie Simmering, LPN   07/27/5571   Nurse Notes:

## 2020-02-21 ENCOUNTER — Ambulatory Visit: Payer: Medicare Other | Admitting: Pharmacist

## 2020-02-21 DIAGNOSIS — E0821 Diabetes mellitus due to underlying condition with diabetic nephropathy: Secondary | ICD-10-CM

## 2020-02-21 DIAGNOSIS — E1159 Type 2 diabetes mellitus with other circulatory complications: Secondary | ICD-10-CM

## 2020-02-21 NOTE — Progress Notes (Signed)
Chronic Care Management Pharmacy  Name: Jason Contreras  MRN: [id number] DOB: [birth year]   Chief Complaint/ HPI  Jason Contreras,  79 y.o. , male presents for their Follow-Up CCM visit with the clinical pharmacist via telephone.  PCP : Venita Lick, NP Patient Care Team: Venita Lick, NP as PCP - General (Nurse Practitioner) [name], MD as Consulting Physician (Urology) [name], MD as Consulting Physician (Interventional Radiology) Vanita Ingles, RN as Case Manager (General Practice)  Their chronic conditions include: Hypertension, Hyperlipidemia, Diabetes, BPH, seronegative rheumatoid  Arthritis and history of renal cancer.  Office Visits: 10/11/19- [name], NP - labs, inc vit d to 2000u daily, upcoming visit 02/28/20 08/18/19- Catie Darnelle Maffucci , PharmD- CCM- receiving Medicare LIS  Consult Visit: 12/08/19- Dr. Posey Pronto- Rheumatology - RA stage 2, continue Arava 3m qd 09/15/19 - Dr. BElmarie Shiley Urology - finasteride, flomax   Allergies  Allergen Reactions  . Morphine Nausea And Vomiting  . Morphine And Related Nausea And Vomiting    Medications: Outpatient Encounter Medications as of 02/21/2020  Medication Sig  . atorvastatin (LIPITOR) 20 MG tablet TAKE 1 TABLET BY MOUTH  DAILY AT 6 PM.  . carvedilol (COREG) 6.25 MG tablet Take 1 tablet (6.25 mg total) by mouth 2 (two) times daily.  . cholecalciferol (VITAMIN D3) 25 MCG (1000 UNIT) tablet Take 2,000 Units by mouth daily.   . DULoxetine (CYMBALTA) 60 MG capsule Take 1 capsule (60 mg total) by mouth daily.  . finasteride (PROSCAR) 5 MG tablet Take 1 tablet (5 mg total) by mouth daily.  .Marland Kitchengabapentin (NEURONTIN) 600 MG tablet Take 1 tablet (600 mg total) by mouth 3 (three) times daily.  .Marland Kitchenleflunomide (ARAVA) 20 MG tablet Take 20 mg by mouth daily.  .Marland Kitchenlidocaine (LIDODERM) 5 % Place 1 patch onto the skin daily. Remove & Discard patch within 12 hours or as directed by MD  . metFORMIN (GLUCOPHAGE) 500 MG  tablet TAKE 2 TABLETS BY MOUTH TWO TIMES DAILY  . tamsulosin (FLOMAX) 0.4 MG CAPS capsule Take 1 capsule (0.4 mg total) by mouth daily.  . mometasone (ELOCON) 0.1 % cream APPLY  CREAM TOPICALLY ONCE DAILY  . vitamin B-12 (CYANOCOBALAMIN) 1000 MCG tablet Take 1,000 mcg by mouth daily.    No facility-administered encounter medications on file as of 02/21/2020.    Wt Readings from Last 3 Encounters:  02/28/20 203 lb (92.1 kg)  01/31/20 208 lb (94.3 kg)  10/11/19 207 lb 3.2 oz (94 kg)    Current Diagnosis/Assessment:    Goals Addressed            This Visit's Progress   . Pharmacy Care Plan       CARE PLAN ENTRY (see longitudinal plan of care for additional care plan information)  Current Barriers:  . Chronic Disease Management support, education, and care coordination needs related to Hypertension, Hyperlipidemia, Diabetes, BPH, and Rheumatoid Arthritis   Hypertension BP Readings from Last 3 Encounters:  02/28/20 137/84  10/11/19 130/82  09/15/19 (!) 147/84   . Pharmacist Clinical Goal(s): o Over the next 60 days, patient will work with PharmD and providers to maintain BP goal <130/80 . Current regimen:  o Carvedilol 6.229mbid . Interventions: o I recommend arm cuffs over wrist cuffs for accuracy. Check your blood pressure ~3-4 times weekly. Make sure you are sitting down for at least 5 minutes before, resting calmly, with your feet flat on the floor.   . Patient self care activities -  Over the next  60  days, patient will: o Check BP 3- 4 times weekly, document, and provide at future appointments o Ensure daily salt intake < 2300 mg/day  Hyperlipidemia Lab Results  Component Value Date/Time   LDLCALC 64 10/11/2019 01:23 PM   . Pharmacist Clinical Goal(s): o Over the next 60 days, patient will work with PharmD and providers to maintain LDL goal < 70 . Current regimen:  o Atorvastatin 20 mg daily  Interventions: o Provided diet and exercise counseling. . Patient  self care activities - Over the next 60 days, patient will: o Follow DASH diet  Diabetes Lab Results  Component Value Date/Time   HGBA1C 6.4 02/28/2020 02:15 PM   HGBA1C 7.5 (H) 10/11/2019 01:22 PM   HGBA1C 6.3 12/27/2015 12:00 AM   . Pharmacist Clinical Goal(s): o Over the next 60 days, patient will work with PharmD and providers to achieve A1c goal <7% . Current regimen:  o Metformin 1000 mg bid  . Interventions: o Provided diet and exercise counseling.  o Discussed possible addition of GLP-1/SGLT-2 if A1c remains elevated at next appointment o Checking BG daily. He and spouse check together M- F. . Patient self care activities - Over the next 60 days, patient will: o Check blood sugar once daily, document, and provide at future appointments o Contact provider with any episodes of hypoglycemia o Attend scheduled appointments   Medication management . Pharmacist Clinical Goal(s): o Over the next 60 days, patient will work with PharmD and providers to achieve optimal medication adherence . Current pharmacy: [facility] . Interventions o Comprehensive medication review performed. o Continue current medication management strategy . Patient self care activities - Over the next 60 days, patient will: o Focus on medication adherence by fill dates o Take medications as prescribed o Report any questions or concerns to PharmD and/or provider(s)  Initial goal documentation        Diabetes   A1c goal <7%  Recent Relevant Labs: Lab Results  Component Value Date/Time   HGBA1C 6.4 02/28/2020 02:15 PM   HGBA1C 7.5 (H) 10/11/2019 01:22 PM   HGBA1C 6.3 12/27/2015 12:00 AM   MICROALBUR 10 05/28/2019 09:12 AM   MICROALBUR 10 03/25/2016 01:26 PM    Last diabetic Eye exam:  Lab Results  Component Value Date/Time   HMDIABEYEEXA No Retinopathy 05/03/2016 01:55 PM    BMP Latest Ref Rng & Units 10/11/2019 07/12/2019 05/28/2019  Glucose 65 - 99 mg/dL 167(H) 267(H) 141(H)  BUN 8  - 27 mg/dL '18 17 13  ' Creatinine 0.76 - 1.27 mg/dL 0.96 0.91 0.70(L)  BUN/Creat Ratio 10 - '24 19 19 19  ' Sodium 134 - 144 mmol/L 142 138 141  Potassium 3.5 - 5.2 mmol/L 4.6 5.2 5.2  Chloride 96 - 106 mmol/L 104 99 100  CO2 20 - 29 mmol/L '22 26 28  ' Calcium 8.6 - 10.2 mg/dL 9.1 8.8 9.3    Last diabetic Foot exam: No results found for: HMDIABFOOTEX   Checking BG: Daily Monday -Friday  Recent FBG Readings: 120 - 140s  Patient has failed these meds in past: NA Patient is currently uncontrolled on the following medications: [name] Metformin 1000 mg bid  We discussed: diet and exercise extensively and how to recognize and treat signs of hypoglycemia.   Plan  Continue current medications.  Recommend consider  GLP-1 or SGLT-2   if A1C remains elevated at next visit.  Health Maintenance/Vit D deficiency   Vit D, 25-Hydroxy  Date Value Ref  Range Status  10/11/2019 25.6 (L) 30.0 - 100.0 ng/mL Final    Comment:    Vitamin D deficiency has been defined by the [place] practice guideline as a level of serum 25-OH vitamin D less than 20 ng/mL (1,2). The Endocrine Society went on to further define vitamin D insufficiency as a level between 21 and 29 ng/mL (2). 1. IOM (Institute of Medicine). 2010. Dietary reference    intakes for calcium and D. [place]: The    [employer]. 2. Holick MF, Binkley [place], Bischoff-Ferrari HA, et al.    Evaluation, treatment, and prevention of vitamin D    deficiency: an Endocrine Society clinical practice    guideline. JCEM. 2011 Jul; 96(7):1911-30.     Patient is currently controlled on the following medications:  [name] Vitamin d 2000 iu qd    We discussed:  Patient verified he increased vit d dose to  2000 iu qd. Last B12 level 10/11/19 =1581 patient discontinued therapy at that time. Discussed depletion while on metformin.  Plan  Continue current medications with follow up levels q 6 months to 1  year.  Hypertension   BMP Latest Ref Rng & Units 10/11/2019 07/12/2019 05/28/2019  Glucose 65 - 99 mg/dL 167(H) 267(H) 141(H)  BUN 8 - 27 mg/dL '18 17 13  ' Creatinine 0.76 - 1.27 mg/dL 0.96 0.91 0.70(L)  BUN/Creat Ratio 10 - '24 19 19 19  ' Sodium 134 - 144 mmol/L 142 138 141  Potassium 3.5 - 5.2 mmol/L 4.6 5.2 5.2  Chloride 96 - 106 mmol/L 104 99 100  CO2 20 - 29 mmol/L '22 26 28  ' Calcium 8.6 - 10.2 mg/dL 9.1 8.8 9.3   BP goal is:  <130/80  Office blood pressures are  BP Readings from Last 3 Encounters:  02/28/20 137/84  10/11/19 130/82  09/15/19 (!) 147/84   Patient checks BP at home  Patient home BP readings are ranging: 120-140s  Patient has failed these meds in the past:  Patient is currently controlled on the following medications:  . Carvedilol 6.10m bid  We discussed Proper technique for checking blood pressure.   Plan  Continue current medications     Hyperlipidemia   LDL goal < 70  Lipid Panel     Component Value Date/Time   CHOL 129 10/11/2019 1323   CHOL 101 10/13/2018 0901   TRIG 122 10/11/2019 1323   TRIG 91 10/13/2018 0901   HDL 43 10/11/2019 1323   LDLCALC 64 10/11/2019 1323    Hepatic Function Latest Ref Rng & Units 05/28/2019 10/13/2018 03/12/2018  Total Protein 6.0 - 8.5 g/dL 6.5 - 6.2  Albumin 3.7 - 4.7 g/dL 4.0 - 4.0  AST 0 - 40 IU/L '13 16 16  ' ALT 0 - 44 IU/L '8 19 10  ' Alk Phosphatase 39 - 117 IU/L 99 - 69  Total Bilirubin 0.0 - 1.2 mg/dL 0.5 - 0.5     The ASCVD Risk score (GCalmar, et al., 2013) failed to calculate for the following reasons:   The valid total cholesterol range is 130 to 320 mg/dL   Patient has failed these meds in past: NA Patient is currently controlled on the following medications:  . Atorvastatin 20 mg qd  We discussed:  Diet and exercise.   Plan  Continue current medications  Rheumatoid Arthritis   Patient has failed these meds in past: NA Patient is currently controlled on the following medications:   . Leflunamide 20  mg daily  We discussed:  Patient follows with Dr. Posey Pronto. Patient reports great improvement in symptoms since starting leflunomide.  Report occassional diarrhea but he is able to tolerate and was counseled by prescriber. He reports trying to drink more water to prevent dehydration.  Plan  Continue current medications   Medication Management   Pt uses mailorder pharmacy for all medications Uses pill box? Yes Pt endorses 95% compliance  We discussed: Current pharmacy is preferred with insurance plan and patient is satisfied with pharmacy services  Plan Will discuss BPH,and chronic pain at next visit.  Continue current medication management strategy    Follow up: 2  month phone visit  Junita Push. Kenton Kingfisher PharmD, [facility] [phone]

## 2020-02-25 ENCOUNTER — Encounter: Payer: Self-pay | Admitting: Nurse Practitioner

## 2020-02-25 ENCOUNTER — Ambulatory Visit: Payer: Medicare Other | Attending: Internal Medicine

## 2020-02-25 ENCOUNTER — Other Ambulatory Visit: Payer: Self-pay | Admitting: Internal Medicine

## 2020-02-25 DIAGNOSIS — Z23 Encounter for immunization: Secondary | ICD-10-CM

## 2020-02-25 NOTE — Progress Notes (Signed)
   Covid-19 Vaccination Clinic  Name:  Jason Contreras    MRN: [id number] DOB: May 01, 1941  02/25/2020  Jason Contreras was observed post Covid-19 immunization for 15 minutes without incident. He was provided with Vaccine Information Sheet and instruction to access the V-Safe system.   Jason Contreras was instructed to call 911 with any severe reactions post vaccine: [name] Difficulty breathing  . Swelling of face and throat  . A fast heartbeat  . A bad rash all over body  . Dizziness and weakness

## 2020-02-28 ENCOUNTER — Encounter: Payer: Self-pay | Admitting: Nurse Practitioner

## 2020-02-28 ENCOUNTER — Other Ambulatory Visit: Payer: Self-pay

## 2020-02-28 ENCOUNTER — Ambulatory Visit (INDEPENDENT_AMBULATORY_CARE_PROVIDER_SITE_OTHER): Payer: Medicare Other | Admitting: Nurse Practitioner

## 2020-02-28 VITALS — BP 137/84 | HR 66 | Temp 98.0°F | Resp 16 | Wt 203.0 lb

## 2020-02-28 DIAGNOSIS — E538 Deficiency of other specified B group vitamins: Secondary | ICD-10-CM

## 2020-02-28 DIAGNOSIS — E559 Vitamin D deficiency, unspecified: Secondary | ICD-10-CM | POA: Diagnosis not present

## 2020-02-28 DIAGNOSIS — E0821 Diabetes mellitus due to underlying condition with diabetic nephropathy: Secondary | ICD-10-CM

## 2020-02-28 DIAGNOSIS — E1169 Type 2 diabetes mellitus with other specified complication: Secondary | ICD-10-CM

## 2020-02-28 DIAGNOSIS — E1159 Type 2 diabetes mellitus with other circulatory complications: Secondary | ICD-10-CM | POA: Diagnosis not present

## 2020-02-28 DIAGNOSIS — I152 Hypertension secondary to endocrine disorders: Secondary | ICD-10-CM

## 2020-02-28 DIAGNOSIS — D692 Other nonthrombocytopenic purpura: Secondary | ICD-10-CM | POA: Diagnosis not present

## 2020-02-28 DIAGNOSIS — M0609 Rheumatoid arthritis without rheumatoid factor, multiple sites: Secondary | ICD-10-CM

## 2020-02-28 DIAGNOSIS — E785 Hyperlipidemia, unspecified: Secondary | ICD-10-CM

## 2020-02-28 DIAGNOSIS — R972 Elevated prostate specific antigen [PSA]: Secondary | ICD-10-CM

## 2020-02-28 DIAGNOSIS — N401 Enlarged prostate with lower urinary tract symptoms: Secondary | ICD-10-CM

## 2020-02-28 DIAGNOSIS — N138 Other obstructive and reflux uropathy: Secondary | ICD-10-CM

## 2020-02-28 LAB — BAYER DCA HB A1C WAIVED: HB A1C (BAYER DCA - WAIVED): 6.4 % (ref <7.0)

## 2020-02-28 NOTE — Assessment & Plan Note (Signed)
Chronic, ongoing.  Continue current medication regimen and collaboration with urology.

## 2020-02-28 NOTE — Patient Instructions (Signed)

## 2020-02-28 NOTE — Assessment & Plan Note (Signed)
Chronic, stable.  Recommend continue to perform gentle skin care and monitor for skin breakdown, if present notify provider.

## 2020-02-28 NOTE — Progress Notes (Signed)
BP 137/84 (BP Location: Left Arm, Patient Position: Sitting, Cuff Size: Normal)   Pulse 66   Temp 98 F (36.7 C) (Oral)   Resp 16   Wt 203 lb (92.1 kg)   SpO2 97%   BMI 29.13 kg/m    Subjective:    Patient ID: Jason Contreras, male    DOB: December 01, 1940, 79 y.o.   MRN: [id number]  HPI: Jason Contreras is a 79 y.o. male  Chief Complaint  Patient presents with  . Diabetes  . Hypertension  . Hyperlipidemia   DIABETES Continues on Metformin 1000 MG BID, discontinued Actos and Amaryl at past visits.  Previous A1C in June 7.5%.   Hypoglycemic episodes:no Polydipsia/polyuria: no Visual disturbance: no Chest pain: no Paresthesias: no Glucose Monitoring: yes  Accucheck frequency: daily  Fasting glucose: 140 range  Post prandial:  Evening:  Before meals: Taking Insulin?: no  Long acting insulin:  Short acting insulin: Blood Pressure Monitoring: daily Retinal Examination: Not up to Date Foot Exam: Up to Date Pneumovax: Not up to Date Influenza: Up to Date Aspirin: no   HYPERTENSION / HYPERLIPIDEMIA Continues on Coreg 6.25 MG BID + Atorvastatin 20 MG daily. Recent LDL in July was 64.   Satisfied with current treatment? yes Duration of hypertension: chronic BP monitoring frequency: broken cuff BP range:  BP medication side effects: no Duration of hyperlipidemia: chronic Cholesterol medication side effects: no Cholesterol supplements: none Medication compliance: good compliance Aspirin: no Recent stressors: no Recurrent headaches: no Visual changes: no Palpitations: no Dyspnea: no Chest pain: no Lower extremity edema: no Dizzy/lightheaded: no   RHEUMATOID ARTHRITIS He is currently continuing to be followed by rheumatology for RA of multiple sites with negative Rf.  Last visit 12/08/19. Pain control status: stable Duration: chronic Location: multiple areas What Activities task can be accomplished with current medication? ADL's daily Previous pain specialty  evaluation: yes Non-narcotic analgesic meds: yes  ELEVATED PSA: Being followed by urology.  Last PSA 5.7 and last visit with urology on 11/09/19. Had negative biopsy, but findings were on imaging so surveillance.  Has history of kidney cancer to right side, had two operations on this.  Was started on Finasteride at recent urology visit and continues Tamsulosin.  VITAMIN D DEFICIENCY: Level was 25.6 on recent labs, previous 25.7.  Started on supplement at past visits.  No recent falls or fractures. Taking B12 supplements for history of low levels, recent level 1581.  Relevant past medical, surgical, family and social history reviewed and updated as indicated. Interim medical history since our last visit reviewed. Allergies and medications reviewed and updated.  Review of Systems  Constitutional: Negative for activity change, diaphoresis, fatigue and fever.  Respiratory: Negative for cough, chest tightness, shortness of breath and wheezing.   Cardiovascular: Negative for chest pain, palpitations and leg swelling.  Gastrointestinal: Negative.   Endocrine: Negative for polydipsia, polyphagia and polyuria.  Neurological: Negative for dizziness, syncope, weakness and numbness.  Psychiatric/Behavioral: Negative.     Per HPI unless specifically indicated above     Objective:    BP 137/84 (BP Location: Left Arm, Patient Position: Sitting, Cuff Size: Normal)   Pulse 66   Temp 98 F (36.7 C) (Oral)   Resp 16   Wt 203 lb (92.1 kg)   SpO2 97%   BMI 29.13 kg/m   Wt Readings from Last 3 Encounters:  02/28/20 203 lb (92.1 kg)  01/31/20 208 lb (94.3 kg)  10/11/19 207 lb 3.2 oz (94 kg)  Physical Exam Vitals and nursing note reviewed.  Constitutional:      General: He is awake.     Appearance: He is well-developed and overweight.  HENT:     Head: Normocephalic and atraumatic.     Right Ear: Hearing normal. No drainage.     Left Ear: Hearing normal. No drainage.  Eyes:     General:  Lids are normal.        Right eye: No discharge.        Left eye: No discharge.     Conjunctiva/sclera: Conjunctivae normal.     Pupils: Pupils are equal, round, and reactive to light.  Neck:     Thyroid: No thyromegaly.     Vascular: No carotid bruit or JVD.     Trachea: Trachea normal.  Cardiovascular:     Rate and Rhythm: Normal rate and regular rhythm.     Heart sounds: Normal heart sounds, S1 normal and S2 normal. No murmur heard.  No gallop.   Pulmonary:     Effort: Pulmonary effort is normal. No accessory muscle usage or respiratory distress.     Breath sounds: Normal breath sounds.  Abdominal:     General: Bowel sounds are normal.     Palpations: Abdomen is soft.  Musculoskeletal:        General: Normal range of motion.     Cervical back: Normal range of motion and neck supple.     Right lower leg: No edema.     Left lower leg: No edema.  Skin:    General: Skin is warm and dry.     Capillary Refill: Capillary refill takes less than 2 seconds.     Comments: Scattered pale purple/yellow bruises noted to BUE.  Neurological:     Mental Status: He is alert and oriented to person, place, and time.     Cranial Nerves: Cranial nerves are intact.     Coordination: Coordination is intact.     Gait: Gait is intact.     Deep Tendon Reflexes: Reflexes are normal and symmetric.     Reflex Scores:      Brachioradialis reflexes are 2+ on the right side and 2+ on the left side.      Patellar reflexes are 2+ on the right side and 2+ on the left side. Psychiatric:        Mood and Affect: Mood normal.        Behavior: Behavior normal. Behavior is cooperative.        Thought Content: Thought content normal.        Judgment: Judgment normal.    Results for orders placed or performed in visit on 11/05/19  PSA  Result Value Ref Range   Prostate Specific Ag, Serum 5.7 (H) 0.0 - 4.0 ng/mL      Assessment & Plan:   Problem List Items Addressed This Visit      Cardiovascular and  Mediastinum   Hypertension associated with diabetes ([place])    Chronic, stable with BP at goal today in office.  Recommend he monitor BP at home at least three mornings a week + focus on DASH diet.  Continue current medication regimen and adjust as needed.  BMP today.  Return in 3 months.      Relevant Orders   Bayer DCA Hb A1c Waived   Basic metabolic panel   Senile purpura (HCC)    Chronic, stable.  Recommend continue to perform gentle skin care and monitor for skin breakdown,  if present notify provider.        Endocrine   DM due to underlying condition with diabetic nephropathy ([place]) - Primary    Chronic, ongoing in patient with history of renal cell CA.  A1C today 6.4%, decreased from previous.  Continue current medication regimen and adjust as needed,  Monitor BS daily in morning at home.  Continue Metformin, consider use of alternate options such as GLP if elevations present, due to his age and risk for hypoglycemia.  Return to office in 3 months.      Relevant Orders   Bayer DCA Hb A1c Waived   Hyperlipidemia associated with type 2 diabetes mellitus (HCC)    Chronic, ongoing.  Continue current medication regimen and adjust as needed.  Lipid panel today.      Relevant Orders   Bayer DCA Hb A1c Waived   Lipid Panel w/o Chol/HDL Ratio     Musculoskeletal and Integument   Rheumatoid arthritis of multiple sites with negative rheumatoid factor (HCC)    Chronic, ongoing.  Followed by rheumatology with much improved functional status since initial visit with patient.  Continue this collaboration and current regimen as prescribed by them.  Recent note reviewed.        Genitourinary   BPH with obstruction/lower urinary tract symptoms    Chronic, ongoing.  Continue current medication regimen and collaboration with urology.          Other   Elevated PSA    Continue collaboration with urology.  Recent note reviewed.      Vitamin D deficiency    Continue daily supplement and  recheck level today.      Relevant Orders   VITAMIN D 25 Hydroxy (Vit-D Deficiency, Fractures)   B12 deficiency    Ongoing and improving with supplement with recent level at goal.  Recheck next visit.          Follow up plan: Return in about 3 months (around 05/30/2020) for ANNUAL PHYSICAL.

## 2020-02-28 NOTE — Patient Instructions (Addendum)
Visit Information  It was a pleasure speaking with you today. Thank you for letting me be part of your clinical team. Please call with any questions or concerns.   Goals Addressed   None     The patient verbalized understanding of instructions provided today and agreed to receive a mailed copy of patient instruction and/or educational materials.  Telephone follow up appointment with pharmacy team member scheduled for: 04/19/20 at 11:30 AM  Junita Push. Braeden Dolinski PharmD, BCPS Clinical Pharmacist [phone]  Diabetes Mellitus and Nutrition, Adult When you have diabetes (diabetes mellitus), it is very important to have healthy eating habits because your blood sugar (glucose) levels are greatly affected by what you eat and drink. Eating healthy foods in the appropriate amounts, at about the same times every day, can help you:  Control your blood glucose.  Lower your risk of heart disease.  Improve your blood pressure.  Reach or maintain a healthy weight. Every person with diabetes is different, and each person has different needs for a meal plan. Your health care provider may recommend that you work with a diet and nutrition specialist (dietitian) to make a meal plan that is best for you. Your meal plan may vary depending on factors such as:  The calories you need.  The medicines you take.  Your weight.  Your blood glucose, blood pressure, and cholesterol levels.  Your activity level.  Other health conditions you have, such as heart or kidney disease. How do carbohydrates affect me? Carbohydrates, also called carbs, affect your blood glucose level more than any other type of food. Eating carbs naturally raises the amount of glucose in your blood. Carb counting is a method for keeping track of how many carbs you eat. Counting carbs is important to keep your blood glucose at a healthy level, especially if you use insulin or take certain oral diabetes medicines. It is important to know  how many carbs you can safely have in each meal. This is different for every person. Your dietitian can help you calculate how many carbs you should have at each meal and for each snack. Foods that contain carbs include:  Bread, cereal, rice, pasta, and crackers.  Potatoes and corn.  Peas, beans, and lentils.  Milk and yogurt.  Fruit and juice.  Desserts, such as cakes, cookies, ice cream, and candy. How does alcohol affect me? Alcohol can cause a sudden decrease in blood glucose (hypoglycemia), especially if you use insulin or take certain oral diabetes medicines. Hypoglycemia can be a life-threatening condition. Symptoms of hypoglycemia (sleepiness, dizziness, and confusion) are similar to symptoms of having too much alcohol. If your health care provider says that alcohol is safe for you, follow these guidelines:  Limit alcohol intake to no more than 1 drink per day for nonpregnant women and 2 drinks per day for men. One drink equals 12 oz of beer, 5 oz of wine, or 1 oz of hard liquor.  Do not drink on an empty stomach.  Keep yourself hydrated with water, diet soda, or unsweetened iced tea.  Keep in mind that regular soda, juice, and other mixers may contain a lot of sugar and must be counted as carbs. What are tips for following this plan?  Reading food labels  Start by checking the serving size on the "Nutrition Facts" label of packaged foods and drinks. The amount of calories, carbs, fats, and other nutrients listed on the label is based on one serving of the item. Many items contain more  than one serving per package.  Check the total grams (g) of carbs in one serving. You can calculate the number of servings of carbs in one serving by dividing the total carbs by 15. For example, if a food has 30 g of total carbs, it would be equal to 2 servings of carbs.  Check the number of grams (g) of saturated and trans fats in one serving. Choose foods that have low or no amount of these  fats.  Check the number of milligrams (mg) of salt (sodium) in one serving. Most people should limit total sodium intake to less than 2,300 mg per day.  Always check the nutrition information of foods labeled as "low-fat" or "nonfat". These foods may be higher in added sugar or refined carbs and should be avoided.  Talk to your dietitian to identify your daily goals for nutrients listed on the label. Shopping  Avoid buying canned, premade, or processed foods. These foods tend to be high in fat, sodium, and added sugar.  Shop around the outside edge of the grocery store. This includes fresh fruits and vegetables, bulk grains, fresh meats, and fresh dairy. Cooking  Use low-heat cooking methods, such as baking, instead of high-heat cooking methods like deep frying.  Cook using healthy oils, such as olive, canola, or sunflower oil.  Avoid cooking with butter, cream, or high-fat meats. Meal planning  Eat meals and snacks regularly, preferably at the same times every day. Avoid going long periods of time without eating.  Eat foods high in fiber, such as fresh fruits, vegetables, beans, and whole grains. Talk to your dietitian about how many servings of carbs you can eat at each meal.  Eat 4-6 ounces (oz) of lean protein each day, such as lean meat, chicken, fish, eggs, or tofu. One oz of lean protein is equal to: ? 1 oz of meat, chicken, or fish. ? 1 egg. ?  cup of tofu.  Eat some foods each day that contain healthy fats, such as avocado, nuts, seeds, and fish. Lifestyle  Check your blood glucose regularly.  Exercise regularly as told by your health care provider. This may include: ? 150 minutes of moderate-intensity or vigorous-intensity exercise each week. This could be brisk walking, biking, or water aerobics. ? Stretching and doing strength exercises, such as yoga or weightlifting, at least 2 times a week.  Take medicines as told by your health care provider.  Do not use any  products that contain nicotine or tobacco, such as cigarettes and e-cigarettes. If you need help quitting, ask your health care provider.  Work with a Social worker or diabetes educator to identify strategies to manage stress and any emotional and social challenges. Questions to ask a health care provider  Do I need to meet with a diabetes educator?  Do I need to meet with a dietitian?  What number can I call if I have questions?  When are the best times to check my blood glucose? Where to find more information:  American Diabetes Association: diabetes.org  Academy of Nutrition and Dietetics: www.eatright.[employer] of Diabetes and Digestive and Kidney Diseases (NIH): [employer] Summary  A healthy meal plan will help you control your blood glucose and maintain a healthy lifestyle.  Working with a diet and nutrition specialist (dietitian) can help you make a meal plan that is best for you.  Keep in mind that carbohydrates (carbs) and alcohol have immediate effects on your blood glucose levels. It is important to count carbs  and to use alcohol carefully. This information is not intended to replace advice given to you by your health care provider. Make sure you discuss any questions you have with your health care provider. Document Revised: 04/04/2017 Document Reviewed: 05/27/2016 Elsevier Patient Education  2020 [employer].

## 2020-02-28 NOTE — Assessment & Plan Note (Signed)
Chronic, ongoing.  Followed by rheumatology with much improved functional status since initial visit with patient.  Continue this collaboration and current regimen as prescribed by them.  Recent note reviewed.

## 2020-02-28 NOTE — Assessment & Plan Note (Signed)
Chronic, ongoing.  Continue current medication regimen and adjust as needed. Lipid panel today. 

## 2020-02-28 NOTE — Assessment & Plan Note (Signed)
Ongoing and improving with supplement with recent level at goal.  Recheck next visit.

## 2020-02-28 NOTE — Assessment & Plan Note (Signed)
Chronic, ongoing in patient with history of renal cell CA.  A1C today 6.4%, decreased from previous.  Continue current medication regimen and adjust as needed,  Monitor BS daily in morning at home.  Continue Metformin, consider use of alternate options such as GLP if elevations present, due to his age and risk for hypoglycemia.  Return to office in 3 months.

## 2020-02-28 NOTE — Assessment & Plan Note (Signed)
Chronic, stable with BP at goal today in office.  Recommend he monitor BP at home at least three mornings a week + focus on DASH diet.  Continue current medication regimen and adjust as needed.  BMP today.  Return in 3 months.

## 2020-02-28 NOTE — Assessment & Plan Note (Signed)
Continue daily supplement and recheck level today.

## 2020-02-28 NOTE — Assessment & Plan Note (Signed)
Continue collaboration with urology.  Recent note reviewed.

## 2020-02-29 LAB — BASIC METABOLIC PANEL
BUN/Creatinine Ratio: 16 (ref 10–24)
BUN: 12 mg/dL (ref 8–27)
CO2: 27 mmol/L (ref 20–29)
Calcium: 9.1 mg/dL (ref 8.6–10.2)
Chloride: 101 mmol/L (ref 96–106)
Creatinine, Ser: 0.73 mg/dL — ABNORMAL LOW (ref 0.76–1.27)
GFR calc Af Amer: 103 mL/min/{1.73_m2} (ref >59)
GFR calc non Af Amer: 89 mL/min/{1.73_m2} (ref >59)
Glucose: 156 mg/dL — ABNORMAL HIGH (ref 65–99)
Potassium: 4.8 mmol/L (ref 3.5–5.2)
Sodium: 139 mmol/L (ref 134–144)

## 2020-02-29 LAB — LIPID PANEL W/O CHOL/HDL RATIO
Cholesterol, Total: 127 mg/dL (ref 100–199)
HDL: 44 mg/dL (ref >39)
LDL Chol Calc (NIH): 58 mg/dL (ref 0–99)
Triglycerides: 142 mg/dL (ref 0–149)
VLDL Cholesterol Cal: 25 mg/dL (ref 5–40)

## 2020-02-29 LAB — VITAMIN D 25 HYDROXY (VIT D DEFICIENCY, FRACTURES): Vit D, 25-Hydroxy: 36.6 ng/mL (ref 30.0–100.0)

## 2020-02-29 NOTE — Progress Notes (Signed)
Contacted via MyChart  Good morning Jason Contreras, your labs have returned and overall look great.  Vitamin D level improving, continue your daily supplement.  Kidney function stable and cholesterol levels are at goal.  Jason Contreras!!! Keep being awesome!!  Thank you for allowing me to participate in your care. Kindest regards, Jerrard Bradburn

## 2020-03-01 ENCOUNTER — Telehealth: Payer: Self-pay

## 2020-03-01 ENCOUNTER — Telehealth: Payer: Self-pay | Admitting: General Practice

## 2020-03-01 NOTE — Telephone Encounter (Signed)
  Chronic Care Management   Note  03/01/2020 Name: Jason Contreras MRN: [id number] DOB: [birth year]  The patient answered the phone but he stated he could not talk as he had a couple of people waiting on him and he also had lunch.  States that he is doing fine and just saw Henrine Screws, NP on Monday. Denies any needs.  Agrees to call the [facility] for any new concerns. Ask for follow up after visit to his daughters in [place] for the holidays.   Follow up plan: Telephone follow up appointment with care management team member scheduled for: 05-18-2019 at 1:45 pm  [place], MSN, [facility] Mobile: [phone]

## 2020-03-13 ENCOUNTER — Other Ambulatory Visit: Payer: Self-pay

## 2020-03-13 ENCOUNTER — Ambulatory Visit (INDEPENDENT_AMBULATORY_CARE_PROVIDER_SITE_OTHER): Payer: Medicare Other

## 2020-03-13 DIAGNOSIS — Z23 Encounter for immunization: Secondary | ICD-10-CM | POA: Diagnosis not present

## 2020-03-20 ENCOUNTER — Other Ambulatory Visit: Payer: Self-pay | Admitting: Nurse Practitioner

## 2020-03-20 DIAGNOSIS — E0821 Diabetes mellitus due to underlying condition with diabetic nephropathy: Secondary | ICD-10-CM

## 2020-03-20 NOTE — Telephone Encounter (Signed)
Requested medications are due for refill today yes  Requested medications are on the active medication list yes  Last refill 01/25/20  Last visit I do not see this med/dx addressed in [place] note  Future visit scheduled 05/2020  Notes to clinic Do not see this addressed, does have appt in Jan

## 2020-03-23 ENCOUNTER — Telehealth: Payer: Self-pay | Admitting: Urology

## 2020-03-23 NOTE — Telephone Encounter (Signed)
Would you call Jason Contreras and get him scheduled for an appointment with Dr. Erlene Quan in the next few weeks? It is time for his follow up for Renal Cell carcinoma and elevated PSA

## 2020-03-29 NOTE — Telephone Encounter (Signed)
Appointments made pt. Aware.

## 2020-04-05 ENCOUNTER — Other Ambulatory Visit: Payer: Self-pay | Admitting: Nurse Practitioner

## 2020-04-05 DIAGNOSIS — M8949 Other hypertrophic osteoarthropathy, multiple sites: Secondary | ICD-10-CM | POA: Diagnosis not present

## 2020-04-05 DIAGNOSIS — Z79899 Other long term (current) drug therapy: Secondary | ICD-10-CM | POA: Diagnosis not present

## 2020-04-05 DIAGNOSIS — M0609 Rheumatoid arthritis without rheumatoid factor, multiple sites: Secondary | ICD-10-CM | POA: Diagnosis not present

## 2020-04-12 ENCOUNTER — Other Ambulatory Visit: Payer: Medicare Other

## 2020-04-12 ENCOUNTER — Other Ambulatory Visit: Payer: Self-pay

## 2020-04-12 DIAGNOSIS — R972 Elevated prostate specific antigen [PSA]: Secondary | ICD-10-CM

## 2020-04-13 LAB — PSA: Prostate Specific Ag, Serum: 4.1 ng/mL — ABNORMAL HIGH (ref 0.0–4.0)

## 2020-04-19 ENCOUNTER — Telehealth: Payer: Self-pay

## 2020-04-19 ENCOUNTER — Other Ambulatory Visit: Payer: Self-pay

## 2020-04-19 ENCOUNTER — Ambulatory Visit: Payer: Medicare Other | Admitting: Urology

## 2020-04-19 ENCOUNTER — Encounter: Payer: Self-pay | Admitting: Urology

## 2020-04-19 VITALS — BP 127/74 | HR 106

## 2020-04-19 DIAGNOSIS — C641 Malignant neoplasm of right kidney, except renal pelvis: Secondary | ICD-10-CM

## 2020-04-19 DIAGNOSIS — R972 Elevated prostate specific antigen [PSA]: Secondary | ICD-10-CM

## 2020-04-19 DIAGNOSIS — N401 Enlarged prostate with lower urinary tract symptoms: Secondary | ICD-10-CM | POA: Diagnosis not present

## 2020-04-19 DIAGNOSIS — N138 Other obstructive and reflux uropathy: Secondary | ICD-10-CM

## 2020-04-19 NOTE — Progress Notes (Signed)
04/19/2020 12:22 PM   Jason Contreras [birth year] [id number]  Referring provider: Venita Lick, NP [address] [place],  [place] [address]  Chief Complaint  Patient presents with  . Benign Prostatic Hypertrophy    HPI: 79 y.o. male with an elevated PSA who returns today for 4 months follow-up of elevated PSA and voiding symptoms.    DRE 6/20 by Carlis Stable which was unremarkable.Prostate biopsy in 2017 was negative with TRUS volume of 41.  His MRI of prostate from 07/23/19 revealedPI-RADS category 5 lesion of the right anterior transition zone and right anterior fibromuscular stroma at the prostate base and mid gland. Lesion measures 2.1 by 1.4 by 2.2 cm.  He underwent a prostate bx on 09/02/19 which was unremarkable, no evidence of malignancy.  Overall, he is doing well from a urinary standpoint.  He feels like his urinary symptoms have definitely improved since starting finasteride in addition to Flomax.  Is very pleased.  IPSS as below.  PSA Trend: 07/29/2017: 6.8 01/12/2018: 5.3 10/08/2018: 5.9 05/28/2019: 10.3 07/06/2019: 13.3  11/05/2019: 5.7  Started on finasteride 2 months prior to this lab 04/12/2020: 4.1   He also has a personal history of renal cell carcinoma status post partial nephrectomy followed by cryoablation for secondary lesion.  Please see previous notes for details.  Last imaging 07/2019 NED.   IPSS    Row Name 04/19/20 1400         International Prostate Symptom Score   How often have you had the sensation of not emptying your bladder? About half the time     How often have you had to urinate less than every two hours? About half the time     How often have you found you stopped and started again several times when you urinated? Less than half the time     How often have you found it difficult to postpone urination? Less than half the time     How often have you had a weak urinary stream? Less than 1 in 5 times     How often have you had  to strain to start urination? Less than 1 in 5 times     How many times did you typically get up at night to urinate? 3 Times     Total IPSS Score 15           Quality of Life due to urinary symptoms   If you were to spend the rest of your life with your urinary condition just the way it is now how would you feel about that? Mixed            Score:  1-7 Mild 8-19 Moderate 20-35 Severe   PMH: Past Medical History:  Diagnosis Date  . Adenocarcinoma, renal cell ([place])   . Arthritis   . Collagen vascular disease ([place])   . Diabetes mellitus without complication ([place])   . Elevated PSA   . Headache   . Hematuria   . Hyperlipidemia   . Hypertension   . Neuropathy   . Obesity   . PONV (postoperative nausea and vomiting)   . Renal insufficiency   . Right renal mass     Surgical History: Past Surgical History:  Procedure Laterality Date  . APPENDECTOMY    . CRYOABLATION  10/17/2017  . IR RADIOLOGIST EVAL & MGMT  07/17/2017  . IR RADIOLOGIST EVAL & MGMT  08/13/2017  . IR RADIOLOGIST EVAL & MGMT  11/12/2017  .  IR RADIOLOGIST EVAL & MGMT  03/05/2018  . RADIOLOGY WITH ANESTHESIA Right 10/17/2017   Procedure: CT WITH ANESTHESIA RENAL CRYOABLATION;  Surgeon: [name], MD;  Location: [facility] ORS;  Service: Anesthesiology;  Laterality: Right;  . ROBOTIC ASSITED PARTIAL NEPHRECTOMY Right 12/12/2015   Procedure: ROBOTIC ASSITED PARTIAL NEPHRECTOMY;  Surgeon: [name], MD;  Location: [facility] ORS;  Service: Urology;  Laterality: Right;    Home Medications:  Allergies as of 04/19/2020      Reactions   Morphine Nausea And Vomiting   Morphine And Related Nausea And Vomiting      Medication List       Accurate as of April 19, 2020 11:59 PM. If you have any questions, ask your nurse or doctor.        STOP taking these medications   leflunomide 20 MG tablet Commonly known as: ARAVA Stopped by: [name], MD     TAKE these medications   atorvastatin 20 MG  tablet Commonly known as: LIPITOR TAKE 1 TABLET BY MOUTH  DAILY AT 6 PM.   carvedilol 6.25 MG tablet Commonly known as: COREG Take 1 tablet (6.25 mg total) by mouth 2 (two) times daily.   cholecalciferol 25 MCG (1000 UNIT) tablet Commonly known as: VITAMIN D3 Take 2,000 Units by mouth daily.   DULoxetine 60 MG capsule Commonly known as: CYMBALTA TAKE 1 CAPSULE BY MOUTH  DAILY   finasteride 5 MG tablet Commonly known as: PROSCAR Take 1 tablet (5 mg total) by mouth daily.   gabapentin 600 MG tablet Commonly known as: NEURONTIN TAKE 1 TABLET BY MOUTH 3  TIMES DAILY   lidocaine 5 % Commonly known as: LIDODERM Place 1 patch onto the skin daily. Remove & Discard patch within 12 hours or as directed by MD   metFORMIN 500 MG tablet Commonly known as: GLUCOPHAGE TAKE 2 TABLETS BY MOUTH  TWICE DAILY   methotrexate 2.5 MG tablet Commonly known as: RHEUMATREX Take by mouth.   mometasone 0.1 % cream Commonly known as: ELOCON APPLY  CREAM TOPICALLY ONCE DAILY   tamsulosin 0.4 MG Caps capsule Commonly known as: FLOMAX Take 1 capsule (0.4 mg total) by mouth daily.   vitamin B-12 1000 MCG tablet Commonly known as: CYANOCOBALAMIN Take 1,000 mcg by mouth daily.       Allergies:  Allergies  Allergen Reactions  . Morphine Nausea And Vomiting  . Morphine And Related Nausea And Vomiting    Family History: Family History  Problem Relation Age of Onset  . Bone cancer Mother   . Cancer Mother   . Cancer Father   . Stroke Brother   . Kidney disease Neg Hx     Social History:  reports that he has never smoked. He has never used smokeless tobacco. He reports that he does not drink alcohol and does not use drugs.   Physical Exam: BP 127/74   Pulse (!) 106   Constitutional:  Alert and oriented, No acute distress. HEENT: [place] AT, moist mucus membranes.  Trachea midline, no masses. Cardiovascular: No clubbing, cyanosis, or edema. Respiratory: Normal respiratory effort, no  increased work of breathing. Skin: No rashes, bruises or suspicious lesions. Neurologic: Grossly intact, no focal deficits, moving all 4 extremities. Psychiatric: Normal mood and affect.  Results for orders placed or performed in visit on 04/12/20  PSA  Result Value Ref Range   Prostate Specific Ag, Serum 4.1 (H) 0.0 - 4.0 ng/mL     Assessment & Plan:    1. Renal  cell cancer, right [facility]) Personal history of renal cell carcinoma as outlined above, due for repeat imaging 07/2020  Surveillance imaging in the form of CT abdomen pelvis with contrast ordered for next visit 07/2020 - CT Abdomen Pelvis W Contrast; Future  2. BPH with obstruction/lower urinary tract symptoms Symptoms improving on Flomax and finasteride, continue these medications - PSA; Future  3. Elevated PSA Significant history as outlined above status post MRI fusion followed by fusion biopsy which was negative PSA is appropriately trending downward on finasteride We will continue to follow with serial imaging and rectal exam, PSA DRE next visit  Follow-up in 4 months with imaging/PSA/DRE/IPSS  [name], MD  [place] [address] [place],  [address] [phone]

## 2020-04-19 NOTE — Chronic Care Management (AMB) (Deleted)
Chronic Care Management Pharmacy  Name: Jason Contreras  MRN: [id number] DOB: December 28, 1940   Chief Complaint/ HPI  [name],  79 y.o. , male presents for their Follow-Up CCM visit with the clinical pharmacist via telephone.  PCP : Venita Lick, NP Patient Care Team: Venita Lick, NP as PCP - General (Nurse Practitioner) [name], MD as Consulting Physician (Urology) [name], MD as Consulting Physician (Interventional Radiology) Vanita Ingles, RN as Case Manager ([place]) Vladimir Faster, [facility] as Pharmacist (Pharmacist)  Their chronic conditions include: Hypertension, Hyperlipidemia, Diabetes, BPH, seronegative rheumatoid  Arthritis and history of renal cancer.  Office Visits: 03/13/20- flu vaccine- nurse visit 02/28/20- [name], NP- 10/11/19- [name], NP - labs, inc vit d to 2000u daily, upcoming visit 02/28/20 08/18/19- Catie Darnelle Maffucci , PharmD- CCM- receiving Medicare LIS  Consult Visit: 04/05/20- DR. Posey Pronto- Rheumatology- folic acid 1 mg qd, MTX 15 mg q 7 d, leflunnmide diarrhea, no joint pain or swelling eGFR ~82 12/08/19- Dr. Posey Pronto- Rheumatology - RA stage 2, continue Arava 44m qd 09/15/19 - Dr. BErlene Quan Urology - finasteride, flomax   Allergies  Allergen Reactions  . Morphine Nausea And Vomiting  . Morphine And Related Nausea And Vomiting    Medications: Outpatient Encounter Medications as of 04/19/2020  Medication Sig  . atorvastatin (LIPITOR) 20 MG tablet TAKE 1 TABLET BY MOUTH  DAILY AT 6 PM.  . carvedilol (COREG) 6.25 MG tablet Take 1 tablet (6.25 mg total) by mouth 2 (two) times daily.  . cholecalciferol (VITAMIN D3) 25 MCG (1000 UNIT) tablet Take 2,000 Units by mouth daily.   . DULoxetine (CYMBALTA) 60 MG capsule TAKE 1 CAPSULE BY MOUTH  DAILY  . finasteride (PROSCAR) 5 MG tablet Take 1 tablet (5 mg total) by mouth daily.  .Marland Kitchengabapentin (NEURONTIN) 600 MG tablet TAKE 1 TABLET BY MOUTH 3  TIMES DAILY  . leflunomide  (ARAVA) 20 MG tablet Take 20 mg by mouth daily.  .Marland Kitchenlidocaine (LIDODERM) 5 % Place 1 patch onto the skin daily. Remove & Discard patch within 12 hours or as directed by MD  . metFORMIN (GLUCOPHAGE) 500 MG tablet TAKE 2 TABLETS BY MOUTH  TWICE DAILY  . mometasone (ELOCON) 0.1 % cream APPLY  CREAM TOPICALLY ONCE DAILY  . tamsulosin (FLOMAX) 0.4 MG CAPS capsule Take 1 capsule (0.4 mg total) by mouth daily.  . vitamin B-12 (CYANOCOBALAMIN) 1000 MCG tablet Take 1,000 mcg by mouth daily.    No facility-administered encounter medications on file as of 04/19/2020.    Wt Readings from Last 3 Encounters:  02/28/20 203 lb (92.1 kg)  01/31/20 208 lb (94.3 kg)  10/11/19 207 lb 3.2 oz (94 kg)    Current Diagnosis/Assessment:    Goals Addressed   None     Diabetes   A1c goal <7%  Recent Relevant Labs: Lab Results  Component Value Date/Time   HGBA1C 6.4 02/28/2020 02:15 PM   HGBA1C 7.5 (H) 10/11/2019 01:22 PM   HGBA1C 6.3 12/27/2015 12:00 AM   MICROALBUR 10 05/28/2019 09:12 AM   MICROALBUR 10 03/25/2016 01:26 PM    Last diabetic Eye exam:  Lab Results  Component Value Date/Time   HMDIABEYEEXA No Retinopathy 05/03/2016 01:55 PM    BMP Latest Ref Rng & Units 02/28/2020 10/11/2019 07/12/2019  Glucose 65 - 99 mg/dL 156(H) 167(H) 267(H)  BUN 8 - 27 mg/dL '12 18 17  ' Creatinine 0.76 - 1.27 mg/dL 0.73(L) 0.96 0.91  BUN/Creat Ratio 10 - 24  '16 19 19  ' Sodium 134 - 144 mmol/L 139 142 138  Potassium 3.5 - 5.2 mmol/L 4.8 4.6 5.2  Chloride 96 - 106 mmol/L 101 104 99  CO2 20 - 29 mmol/L '27 22 26  ' Calcium 8.6 - 10.2 mg/dL 9.1 9.1 8.8    Last diabetic Foot exam: No results found for: HMDIABFOOTEX   Checking BG: Daily Monday -Friday  Recent FBG Readings: 120 - 140s  Patient has failed these meds in past: NA Patient is currently uncontrolled on the following medications: [name] Metformin 1000 mg bid  We discussed: diet and exercise extensively and how to recognize and treat signs of hypoglycemia.    Plan  Continue current medications.  Recommend consider  GLP-1 or SGLT-2   if A1C remains elevated at next visit.  Health Maintenance/Vit D deficiency   Vit D, 25-Hydroxy  Date Value Ref Range Status  02/28/2020 36.6 30.0 - 100.0 ng/mL Final    Comment:    Vitamin D deficiency has been defined by the [place] practice guideline as a level of serum 25-OH vitamin D less than 20 ng/mL (1,2). The Endocrine Society went on to further define vitamin D insufficiency as a level between 21 and 29 ng/mL (2). 1. IOM (Institute of Medicine). 2010. Dietary reference    intakes for calcium and D. [place]: The    [employer]. 2. Holick MF, Binkley [place], Bischoff-Ferrari HA, et al.    Evaluation, treatment, and prevention of vitamin D    deficiency: an Endocrine Society clinical practice    guideline. JCEM. 2011 Jul; 96(7):1911-30.     Patient is currently controlled on the following medications:  [name] Vitamin d 2000 iu qd    We discussed:  Patient verified he increased vit d dose to  2000 iu qd. Last B12 level 10/11/19 =1581 patient discontinued therapy at that time. Discussed depletion while on metformin.  Plan  Continue current medications with follow up levels q 6 months to 1 year.  Hypertension   BMP Latest Ref Rng & Units 02/28/2020 10/11/2019 07/12/2019  Glucose 65 - 99 mg/dL 156(H) 167(H) 267(H)  BUN 8 - 27 mg/dL '12 18 17  ' Creatinine 0.76 - 1.27 mg/dL 0.73(L) 0.96 0.91  BUN/Creat Ratio 10 - '24 16 19 19  ' Sodium 134 - 144 mmol/L 139 142 138  Potassium 3.5 - 5.2 mmol/L 4.8 4.6 5.2  Chloride 96 - 106 mmol/L 101 104 99  CO2 20 - 29 mmol/L '27 22 26  ' Calcium 8.6 - 10.2 mg/dL 9.1 9.1 8.8   BP goal is:  <130/80  Office blood pressures are  BP Readings from Last 3 Encounters:  02/28/20 137/84  10/11/19 130/82  09/15/19 (!) 147/84   Patient checks BP at home  Patient home BP readings are ranging: 120-140s  Patient has failed  these meds in the past:  Patient is currently controlled on the following medications:  . Carvedilol 6.50m bid  We discussed Proper technique for checking blood pressure.   Plan  Continue current medications     Hyperlipidemia   LDL goal < 70  Lipid Panel     Component Value Date/Time   CHOL 127 02/28/2020 1419   CHOL 101 10/13/2018 0901   TRIG 142 02/28/2020 1419   TRIG 91 10/13/2018 0901   HDL 44 02/28/2020 1419   LDLCALC 58 02/28/2020 1419    Hepatic Function Latest Ref Rng & Units 05/28/2019 10/13/2018 03/12/2018  Total Protein 6.0 -  8.5 g/dL 6.5 - 6.2  Albumin 3.7 - 4.7 g/dL 4.0 - 4.0  AST 0 - 40 IU/L '13 16 16  ' ALT 0 - 44 IU/L '8 19 10  ' Alk Phosphatase 39 - 117 IU/L 99 - 69  Total Bilirubin 0.0 - 1.2 mg/dL 0.5 - 0.5     The ASCVD Risk score ([place] et al., 2013) failed to calculate for the following reasons:   The valid total cholesterol range is 130 to 320 mg/dL   Patient has failed these meds in past: NA Patient is currently controlled on the following medications:  . Atorvastatin 20 mg qd  We discussed:  Diet and exercise.   Plan  Continue current medications  Rheumatoid Arthritis/osteoarthiritis   Patient has failed these meds in past: NA Patient is currently controlled on the following medications:  [name] Methotrexate 15 mg q7d . Folic acid 1 mg qd . acetaminophen  We discussed:  Patient follows with Dr. Posey Pronto. Patient requested medication change from leflunomide due to diarrhea at OV 04/05/20. ? COVID Booster  Plan  Continue current medications   Medication Management   Pt uses mailorder pharmacy for all medications Uses pill box? Yes Pt endorses 95% compliance  We discussed: Current pharmacy is preferred with insurance plan and patient is satisfied with pharmacy services  Plan Will discuss BPH,and chronic pain at next visit.  Continue current medication management strategy    Follow up: 2  month phone visit  Junita Push. Kenton Kingfisher  PharmD, [facility] [phone]

## 2020-05-17 ENCOUNTER — Other Ambulatory Visit: Payer: Self-pay | Admitting: Nurse Practitioner

## 2020-05-17 ENCOUNTER — Telehealth: Payer: Self-pay | Admitting: General Practice

## 2020-05-17 ENCOUNTER — Ambulatory Visit: Payer: Self-pay | Admitting: General Practice

## 2020-05-17 DIAGNOSIS — E785 Hyperlipidemia, unspecified: Secondary | ICD-10-CM

## 2020-05-17 DIAGNOSIS — E1159 Type 2 diabetes mellitus with other circulatory complications: Secondary | ICD-10-CM

## 2020-05-17 DIAGNOSIS — E1169 Type 2 diabetes mellitus with other specified complication: Secondary | ICD-10-CM

## 2020-05-17 DIAGNOSIS — E0821 Diabetes mellitus due to underlying condition with diabetic nephropathy: Secondary | ICD-10-CM

## 2020-05-17 DIAGNOSIS — I152 Hypertension secondary to endocrine disorders: Secondary | ICD-10-CM

## 2020-05-17 MED ORDER — MOMETASONE FUROATE 0.1 % EX CREA: TOPICAL_CREAM | CUTANEOUS | 3 refills | Status: DC

## 2020-05-17 NOTE — Chronic Care Management (AMB) (Signed)
Chronic Care Management   CCM RN Visit Note  05/17/2020 Name: Jason Contreras MRN: [id number] DOB: 1940-07-19  Subjective: Jason Contreras is a 80 y.o. year old male who is a primary care patient of [name], [name], NP. The care management team was consulted for assistance with disease management and care coordination needs.    Engaged with patient by telephone for follow up visit in response to provider referral for case management and/or care coordination services.   Consent to Services:  the patient is engaged with the CCM team  Patient agreed to services and verbal consent obtained.   Assessment/Interventions: Review of patient past medical history, allergies, medications, health status, including review of consultants reports, laboratory and other test data, was performed as part of comprehensive evaluation and provision of chronic care management services.   SDOH (Social Determinants of Health) assessments and interventions performed:    CCM Care Plan  Allergies  Allergen Reactions   Morphine Nausea And Vomiting   Morphine And Related Nausea And Vomiting    Outpatient Encounter Medications as of 05/17/2020  Medication Sig   atorvastatin (LIPITOR) 20 MG tablet TAKE 1 TABLET BY MOUTH  DAILY AT 6 PM.   carvedilol (COREG) 6.25 MG tablet Take 1 tablet (6.25 mg total) by mouth 2 (two) times daily.   cholecalciferol (VITAMIN D3) 25 MCG (1000 UNIT) tablet Take 2,000 Units by mouth daily.    DULoxetine (CYMBALTA) 60 MG capsule TAKE 1 CAPSULE BY MOUTH  DAILY   finasteride (PROSCAR) 5 MG tablet Take 1 tablet (5 mg total) by mouth daily.   gabapentin (NEURONTIN) 600 MG tablet TAKE 1 TABLET BY MOUTH 3  TIMES DAILY   lidocaine (LIDODERM) 5 % Place 1 patch onto the skin daily. Remove & Discard patch within 12 hours or as directed by MD   metFORMIN (GLUCOPHAGE) 500 MG tablet TAKE 2 TABLETS BY MOUTH  TWICE DAILY   methotrexate (RHEUMATREX) 2.5 MG tablet Take by mouth.    tamsulosin (FLOMAX) 0.4 MG CAPS capsule Take 1 capsule (0.4 mg total) by mouth daily.   vitamin B-12 (CYANOCOBALAMIN) 1000 MCG tablet Take 1,000 mcg by mouth daily.    [DISCONTINUED] mometasone (ELOCON) 0.1 % cream APPLY  CREAM TOPICALLY ONCE DAILY   No facility-administered encounter medications on file as of 05/17/2020.    Patient Active Problem List   Diagnosis Date Noted   Rheumatoid arthritis of multiple sites with negative rheumatoid factor ([place]) 12/08/2019   B12 deficiency 07/12/2019   Pain syndrome, chronic 06/30/2019   Vitamin D deficiency 06/30/2019   DJD (degenerative joint disease), lumbosacral 11/04/2017   Personal history of renal cancer 10/17/2017   Advanced care planning/counseling discussion 01/13/2017   Senile purpura ([place]) 09/26/2016   Elevated PSA 01/07/2015   BPH with obstruction/lower urinary tract symptoms 01/07/2015   DM due to underlying condition with diabetic nephropathy ([place])    Hypertension associated with diabetes ([place])    Hyperlipidemia associated with type 2 diabetes mellitus ([place])     Conditions to be addressed/monitored:HTN, HLD and DMII  Care Plan : RNCM: Diabetes Type 2 (Adult)  Updates made by Vanita Ingles since 05/17/2020 12:00 AM    Problem: RNCM: Glycemic Management (Diabetes, Type 2)   Priority: Medium    Goal: RNCM: Glycemic Management Optimized   Note:   Objective:  Lab Results  Component Value Date   HGBA1C 6.4 02/28/2020    Lab Results  Component Value Date   CREATININE 0.73 (L) 02/28/2020   CREATININE  0.96 10/11/2019   CREATININE 0.91 07/12/2019     No results found for: EGFR Current Barriers:   Knowledge Deficits related to basic Diabetes pathophysiology and self care/management  Knowledge Deficits related to medications used for management of diabetes  Unable to independently manage DM  Does not contact provider office for questions/concerns Case Manager Clinical Goal(s):   Collaboration with  Venita Lick, NP regarding development and update of comprehensive plan of care as evidenced by provider attestation and co-signature  Inter-disciplinary care team collaboration (see longitudinal plan of care)  Over the next 120 days, patient will demonstrate improved adherence to prescribed treatment plan for diabetes self care/management as evidenced by:   daily monitoring and recording of CBG   adherence to ADA/ carb modified diet  exercise 3/4 days/week  adherence to prescribed medication regimen Interventions:   Provided education to patient about basic DM disease process  Reviewed medications with patient and discussed importance of medication adherence  Discussed plans with patient for ongoing care management follow up and provided patient with direct contact information for care management team  Provided patient with written educational materials related to hypo and hyperglycemia and importance of correct treatment  Reviewed scheduled/upcoming provider appointments including: 06-02-2020 at 3 pm  Advised patient, providing education and rationale, to check cbg BID and record, calling pcp for findings outside established parameters.    Review of patient status, including review of consultants reports, relevant laboratory and other test results, and medications completed.  - barriers to adherence to treatment plan identified  - blood glucose readings reviewed  - resources required to improve adherence to care identified  - self-awareness of signs/symptoms of hypo or hyperglycemia encouraged  - use of blood glucose monitoring log promoted Patient Goals/Self-Care Activities  Over the next 120 days, patient will:  - UNABLE to independently manage DM Checks blood sugars as prescribed and utilize hyper and hypoglycemia protocol as needed Adheres to prescribed ADA/carb modified Follow Up Plan: Telephone follow up appointment with care management team member scheduled  for: 07-12-2020 at 1:30 pm   Task: RNCM: Alleviate Barriers to Glycemic Management   Note:   Care Management Activities:    - barriers to adherence to treatment plan identified - blood glucose readings reviewed - resources required to improve adherence to care identified - self-awareness of signs/symptoms of hypo or hyperglycemia encouraged - use of blood glucose monitoring log promoted       Care Plan : RNCM: Hypertension (Adult)  Updates made by Vanita Ingles since 05/17/2020 12:00 AM    Problem: RNCM: Hypertension (Hypertension)   Priority: Medium    Goal: RNCM: Hypertension Monitored   Priority: Medium  Note:   Objective:   Last practice recorded BP readings:  BP Readings from Last 3 Encounters:  04/19/20 127/74  02/28/20 137/84  10/11/19 130/82     Most recent eGFR/CrCl: No results found for: EGFR  No components found for: CRCL Current Barriers:   Knowledge Deficits related to basic understanding of hypertension pathophysiology and self care management  Knowledge Deficits related to understanding of medications prescribed for management of hypertension  Unable to independently HTN  Unable to self administer medications as prescribed  Does not contact provider office for questions/concerns Case Manager Clinical Goal(s):   Over the next 120 days, patient will verbalize understanding of plan for hypertension management  Over the next 120 days, patient will attend all scheduled medical appointments: 06-02-2020 at 3 pm  Over the next 120 days, patient will  demonstrate improved adherence to prescribed treatment plan for hypertension as evidenced by taking all medications as prescribed, monitoring and recording blood pressure as directed, adhering to low sodium/DASH diet  Over the next 120 days, patient will demonstrate improved health management independence as evidenced by checking blood pressure as directed and notifying PCP if SBP>160 or DBP > 90, taking all  medications as prescribe, and adhering to a low sodium diet as discussed. Interventions:   Collaboration with Venita Lick, NP regarding development and update of comprehensive plan of care as evidenced by provider attestation and co-signature  Inter-disciplinary care team collaboration (see longitudinal plan of care)  Evaluation of current treatment plan related to hypertension self management and patient's adherence to plan as established by provider.  Provided education to patient re: stroke prevention, s/s of heart attack and stroke, DASH diet, complications of uncontrolled blood pressure  Reviewed medications with patient and discussed importance of compliance  Discussed plans with patient for ongoing care management follow up and provided patient with direct contact information for care management team  Advised patient, providing education and rationale, to monitor blood pressure daily and record, calling PCP for findings outside established parameters.   Reviewed scheduled/upcoming provider appointments including: 06-02-2020 at 3 pm Patient Goals/Self-Care Activities  Over the next 120 days, patient will:  - UNABLE to independently management HTN Calls provider office for new concerns, questions, or BP outside discussed parameters Checks BP and records as discussed Follows a low sodium diet/DASH diet - blood pressure trends reviewed - depression screen reviewed - home or ambulatory blood pressure monitoring encouraged Follow Up Plan: Telephone follow up appointment with care management team member scheduled for: 07-12-2020 at 1:30 pm   Task: RNCM: Identify and Monitor Blood Pressure Elevation   Note:   Care Management Activities:    - blood pressure trends reviewed - depression screen reviewed - home or ambulatory blood pressure monitoring encouraged       Care Plan : RNCM: Management of HLD  Updates made by Vanita Ingles since 05/17/2020 12:00 AM    Problem: RNCM:  Management of HLD   Priority: Medium    Goal: RNCM:HLD  Self-Management Plan Developed   Priority: Medium  Note:   Current Barriers:   Poorly controlled hyperlipidemia, complicated by DM/HTN  Current antihyperlipidemic regimen: Lipitor 20 mg daily   Most recent lipid panel:     Component Value Date/Time   CHOL 127 02/28/2020 1419   CHOL 101 10/13/2018 0901   TRIG 142 02/28/2020 1419   TRIG 91 10/13/2018 0901   HDL 44 02/28/2020 1419   CHOLHDL 2.5 03/12/2018 1356   VLDL 18 10/13/2018 0901   LDLCALC 58 02/28/2020 1419     ASCVD risk enhancing conditions: age >82, DM, HTN  Unable to independently manage HLD  Does not contact provider office for questions/concerns  RN Care Manager Clinical Goal(s):   Over the next 120 days, patient will work with Consulting civil engineer, providers, and care team towards execution of optimized self-health management plan  Over the next 120 days, patient will verbalize understanding of plan for HLD  Over the next 120 days, patient will work with [facility] and pcp to address needs related to HLD and effective management   Over the next 120 days, patient will attend all scheduled medical appointments: 06-02-2020 at 3 pm  Interventions:  Collaboration with Venita Lick, NP regarding development and update of comprehensive plan of care as evidenced by provider attestation and co-signature  Inter-disciplinary  care team collaboration (see longitudinal plan of care)  Medication review performed; medication list updated in electronic medical record.   Inter-disciplinary care team collaboration (see longitudinal plan of care)  Referred to pharmacy team for assistance with HLD medication management  Evaluation of current treatment plan related to HLD and patient's adherence to plan as established by provider.  Advised patient to call the office for changes in condition or questions concerning chronic conditions   Provided education to patient re:  heart healthy/ADA diet and hidden sodium in food content   Discussed plans with patient for ongoing care management follow up and provided patient with direct contact information for care management team  Reviewed scheduled/upcoming provider appointments including: 06-02-2020 at 3 pm   Patient Goals/Self-Care Activities:  Over the next 120 days, patient will:   - call for medicine refill 2 or 3 days before it runs out - call if I am sick and can't take my medicine - keep a list of all the medicines I take; vitamins and herbals too - learn to read medicine labels - drink 6 to 8 glasses of water each day - eat 3 to 5 servings of fruits and vegetables each day - eat 5 or 6 small meals each day - limit fast food meals to no more than 1 per week - manage portion size - prepare main meal at home 3 to 5 days each week - read food labels for fat, fiber, carbohydrates and portion size - be open to making changes - I can manage, know and watch for signs of a heart attack - if I have chest pain, call for help - learn about small changes that will make a big difference - learn my personal risk factors  - barriers to meeting goals identified - choices provided - collaboration with team encouraged - decision-making supported - health risks reviewed - questions answered - resources needed to meet goals identified - self-reflection promoted - self-reliance encouraged  Follow Up Plan: Telephone follow up appointment with care management team member scheduled for:07-12-2020 at 1:30 pm     Task: RNCM: Mutually Develop and Royce Macadamia Achievement of Patient Goals   Note:   Care Management Activities:    - barriers to meeting goals identified - choices provided - collaboration with team encouraged - decision-making supported - health risks reviewed - questions answered - resources needed to meet goals identified - self-reflection promoted - self-reliance encouraged         Plan:Telephone  follow up appointment with care management team member scheduled for:  07-12-2020 at 1:30 pm  Noreene Larsson RN, MSN, [facility] Mobile: [phone]

## 2020-05-17 NOTE — Patient Instructions (Signed)
Visit Information  Patient Care Plan: RNCM: Diabetes Type 2 (Adult)    Problem Identified: RNCM: Glycemic Management (Diabetes, Type 2)   Priority: Medium    Goal: RNCM: Glycemic Management Optimized   Note:   Objective:  Lab Results  Component Value Date   HGBA1C 6.4 02/28/2020 .   Lab Results  Component Value Date   CREATININE 0.73 (L) 02/28/2020   CREATININE 0.96 10/11/2019   CREATININE 0.91 07/12/2019 .   [name] No results found for: EGFR Current Barriers:  [name] Knowledge Deficits related to basic Diabetes pathophysiology and self care/management . Knowledge Deficits related to medications used for management of diabetes . Unable to independently manage DM . Does not contact provider office for questions/concerns Case Manager Clinical Goal(s):  [name] Collaboration with Venita Lick, NP regarding development and update of comprehensive plan of care as evidenced by provider attestation and co-signature . Inter-disciplinary care team collaboration (see longitudinal plan of care) . Over the next 120 days, patient will demonstrate improved adherence to prescribed treatment plan for diabetes self care/management as evidenced by:  . daily monitoring and recording of CBG  . adherence to ADA/ carb modified diet . exercise 3/4 days/week . adherence to prescribed medication regimen Interventions:  . Provided education to patient about basic DM disease process . Reviewed medications with patient and discussed importance of medication adherence . Discussed plans with patient for ongoing care management follow up and provided patient with direct contact information for care management team . Provided patient with written educational materials related to hypo and hyperglycemia and importance of correct treatment . Reviewed scheduled/upcoming provider appointments including: 06-02-2020 at 3 pm . Advised patient, providing education and rationale, to check cbg BID and record, calling pcp for  findings outside established parameters.   . Review of patient status, including review of consultants reports, relevant laboratory and other test results, and medications completed. . - barriers to adherence to treatment plan identified . - blood glucose readings reviewed . - resources required to improve adherence to care identified . - self-awareness of signs/symptoms of hypo or hyperglycemia encouraged . - use of blood glucose monitoring log promoted Patient Goals/Self-Care Activities . Over the next 120 days, patient will:  - UNABLE to independently manage DM Checks blood sugars as prescribed and utilize hyper and hypoglycemia protocol as needed Adheres to prescribed ADA/carb modified Follow Up Plan: Telephone follow up appointment with care management team member scheduled for: 07-12-2020 at 1:30 pm   Task: RNCM: Alleviate Barriers to Glycemic Management   Note:   Care Management Activities:    - barriers to adherence to treatment plan identified - blood glucose readings reviewed - resources required to improve adherence to care identified - self-awareness of signs/symptoms of hypo or hyperglycemia encouraged - use of blood glucose monitoring log promoted       Patient Care Plan: RNCM: Hypertension (Adult)    Problem Identified: RNCM: Hypertension (Hypertension)   Priority: Medium    Goal: RNCM: Hypertension Monitored   Priority: Medium  Note:   Objective:  . Last practice recorded BP readings:  BP Readings from Last 3 Encounters:  04/19/20 127/74  02/28/20 137/84  10/11/19 130/82 .   [name] Most recent eGFR/CrCl: No results found for: EGFR  No components found for: CRCL Current Barriers:  [name] Knowledge Deficits related to basic understanding of hypertension pathophysiology and self care management . Knowledge Deficits related to understanding of medications prescribed for management of hypertension . Unable to independently HTN . Unable  to self administer medications as  prescribed . Does not contact provider office for questions/concerns Case Manager Clinical Goal(s):  [name] Over the next 120 days, patient will verbalize understanding of plan for hypertension management . Over the next 120 days, patient will attend all scheduled medical appointments: 06-02-2020 at 3 pm . Over the next 120 days, patient will demonstrate improved adherence to prescribed treatment plan for hypertension as evidenced by taking all medications as prescribed, monitoring and recording blood pressure as directed, adhering to low sodium/DASH diet . Over the next 120 days, patient will demonstrate improved health management independence as evidenced by checking blood pressure as directed and notifying PCP if SBP>160 or DBP > 90, taking all medications as prescribe, and adhering to a low sodium diet as discussed. Interventions:  . Collaboration with Venita Lick, NP regarding development and update of comprehensive plan of care as evidenced by provider attestation and co-signature . Inter-disciplinary care team collaboration (see longitudinal plan of care) . Evaluation of current treatment plan related to hypertension self management and patient's adherence to plan as established by provider. . Provided education to patient re: stroke prevention, s/s of heart attack and stroke, DASH diet, complications of uncontrolled blood pressure . Reviewed medications with patient and discussed importance of compliance . Discussed plans with patient for ongoing care management follow up and provided patient with direct contact information for care management team . Advised patient, providing education and rationale, to monitor blood pressure daily and record, calling PCP for findings outside established parameters.  . Reviewed scheduled/upcoming provider appointments including: 06-02-2020 at 3 pm Patient Goals/Self-Care Activities . Over the next 120 days, patient will:  - UNABLE to independently  management HTN Calls provider office for new concerns, questions, or BP outside discussed parameters Checks BP and records as discussed Follows a low sodium diet/DASH diet - blood pressure trends reviewed - depression screen reviewed - home or ambulatory blood pressure monitoring encouraged Follow Up Plan: Telephone follow up appointment with care management team member scheduled for: 07-12-2020 at 1:30 pm   Task: RNCM: Identify and Monitor Blood Pressure Elevation   Note:   Care Management Activities:    - blood pressure trends reviewed - depression screen reviewed - home or ambulatory blood pressure monitoring encouraged       Patient Care Plan: RNCM: Management of HLD    Problem Identified: RNCM: Management of HLD   Priority: Medium    Goal: RNCM:HLD  Self-Management Plan Developed   Priority: Medium  Note:   Current Barriers:  . Poorly controlled hyperlipidemia, complicated by DM/HTN . Current antihyperlipidemic regimen: Lipitor 20 mg daily  . Most recent lipid panel:     Component Value Date/Time   CHOL 127 02/28/2020 1419   CHOL 101 10/13/2018 0901   TRIG 142 02/28/2020 1419   TRIG 91 10/13/2018 0901   HDL 44 02/28/2020 1419   CHOLHDL 2.5 03/12/2018 1356   VLDL 18 10/13/2018 0901   LDLCALC 58 02/28/2020 1419 .   [name] ASCVD risk enhancing conditions: age >59, DM, HTN . Unable to independently manage HLD . Does not contact provider office for questions/concerns  RN Care Manager Clinical Goal(s):  [name] Over the next 120 days, patient will work with Consulting civil engineer, providers, and care team towards execution of optimized self-health management plan . Over the next 120 days, patient will verbalize understanding of plan for HLD . Over the next 120 days, patient will work with [facility] and pcp to address needs related to  HLD and effective management  . Over the next 120 days, patient will attend all scheduled medical appointments: 06-02-2020 at 3  pm  Interventions: . Collaboration with Venita Lick, NP regarding development and update of comprehensive plan of care as evidenced by provider attestation and co-signature . Inter-disciplinary care team collaboration (see longitudinal plan of care) . Medication review performed; medication list updated in electronic medical record.  Bertram Savin care team collaboration (see longitudinal plan of care) . Referred to pharmacy team for assistance with HLD medication management . Evaluation of current treatment plan related to HLD and patient's adherence to plan as established by provider. . Advised patient to call the office for changes in condition or questions concerning chronic conditions  . Provided education to patient re: heart healthy/ADA diet and hidden sodium in food content  . Discussed plans with patient for ongoing care management follow up and provided patient with direct contact information for care management team . Reviewed scheduled/upcoming provider appointments including: 06-02-2020 at 3 pm   Patient Goals/Self-Care Activities: . Over the next 120 days, patient will:   - call for medicine refill 2 or 3 days before it runs out - call if I am sick and can't take my medicine - keep a list of all the medicines I take; vitamins and herbals too - learn to read medicine labels - drink 6 to 8 glasses of water each day - eat 3 to 5 servings of fruits and vegetables each day - eat 5 or 6 small meals each day - limit fast food meals to no more than 1 per week - manage portion size - prepare main meal at home 3 to 5 days each week - read food labels for fat, fiber, carbohydrates and portion size - be open to making changes - I can manage, know and watch for signs of a heart attack - if I have chest pain, call for help - learn about small changes that will make a big difference - learn my personal risk factors  - barriers to meeting goals identified - choices  provided - collaboration with team encouraged - decision-making supported - health risks reviewed - questions answered - resources needed to meet goals identified - self-reflection promoted - self-reliance encouraged  Follow Up Plan: Telephone follow up appointment with care management team member scheduled for:07-12-2020 at 1:30 pm     Task: RNCM: Mutually Develop and Royce Macadamia Achievement of Patient Goals   Note:   Care Management Activities:    - barriers to meeting goals identified - choices provided - collaboration with team encouraged - decision-making supported - health risks reviewed - questions answered - resources needed to meet goals identified - self-reflection promoted - self-reliance encouraged         The patient verbalized understanding of instructions, educational materials, and care plan provided today and declined offer to receive copy of patient instructions, educational materials, and care plan.   Telephone follow up appointment with care management team member scheduled for: 07-12-2020 at 1:30pm  Noreene Larsson RN, MSN, [facility] Mobile: [phone]

## 2020-05-26 ENCOUNTER — Encounter: Payer: Self-pay | Admitting: Nurse Practitioner

## 2020-05-26 DIAGNOSIS — I700 Atherosclerosis of aorta: Secondary | ICD-10-CM | POA: Insufficient documentation

## 2020-05-31 ENCOUNTER — Other Ambulatory Visit: Payer: Self-pay | Admitting: Nurse Practitioner

## 2020-06-02 ENCOUNTER — Other Ambulatory Visit: Payer: Self-pay

## 2020-06-02 ENCOUNTER — Ambulatory Visit (INDEPENDENT_AMBULATORY_CARE_PROVIDER_SITE_OTHER): Payer: Medicare Other | Admitting: Nurse Practitioner

## 2020-06-02 ENCOUNTER — Encounter: Payer: Self-pay | Admitting: Nurse Practitioner

## 2020-06-02 VITALS — BP 124/80 | HR 102 | Temp 98.4°F | Ht 68.31 in | Wt 201.8 lb

## 2020-06-02 DIAGNOSIS — N401 Enlarged prostate with lower urinary tract symptoms: Secondary | ICD-10-CM

## 2020-06-02 DIAGNOSIS — N138 Other obstructive and reflux uropathy: Secondary | ICD-10-CM

## 2020-06-02 DIAGNOSIS — E538 Deficiency of other specified B group vitamins: Secondary | ICD-10-CM

## 2020-06-02 DIAGNOSIS — Z85528 Personal history of other malignant neoplasm of kidney: Secondary | ICD-10-CM

## 2020-06-02 DIAGNOSIS — I499 Cardiac arrhythmia, unspecified: Secondary | ICD-10-CM

## 2020-06-02 DIAGNOSIS — E1169 Type 2 diabetes mellitus with other specified complication: Secondary | ICD-10-CM | POA: Diagnosis not present

## 2020-06-02 DIAGNOSIS — Z0000 Encounter for general adult medical examination without abnormal findings: Secondary | ICD-10-CM

## 2020-06-02 DIAGNOSIS — E785 Hyperlipidemia, unspecified: Secondary | ICD-10-CM | POA: Diagnosis not present

## 2020-06-02 DIAGNOSIS — D692 Other nonthrombocytopenic purpura: Secondary | ICD-10-CM

## 2020-06-02 DIAGNOSIS — R972 Elevated prostate specific antigen [PSA]: Secondary | ICD-10-CM

## 2020-06-02 DIAGNOSIS — E0821 Diabetes mellitus due to underlying condition with diabetic nephropathy: Secondary | ICD-10-CM

## 2020-06-02 DIAGNOSIS — D519 Vitamin B12 deficiency anemia, unspecified: Secondary | ICD-10-CM | POA: Diagnosis not present

## 2020-06-02 DIAGNOSIS — M0609 Rheumatoid arthritis without rheumatoid factor, multiple sites: Secondary | ICD-10-CM | POA: Diagnosis not present

## 2020-06-02 DIAGNOSIS — I152 Hypertension secondary to endocrine disorders: Secondary | ICD-10-CM

## 2020-06-02 DIAGNOSIS — E559 Vitamin D deficiency, unspecified: Secondary | ICD-10-CM

## 2020-06-02 DIAGNOSIS — I700 Atherosclerosis of aorta: Secondary | ICD-10-CM | POA: Diagnosis not present

## 2020-06-02 DIAGNOSIS — E1159 Type 2 diabetes mellitus with other circulatory complications: Secondary | ICD-10-CM

## 2020-06-02 LAB — BAYER DCA HB A1C WAIVED: HB A1C (BAYER DCA - WAIVED): 6.5 % (ref <7.0)

## 2020-06-02 MED ORDER — METFORMIN HCL 500 MG PO TABS: ORAL_TABLET | ORAL | 4 refills | Status: DC

## 2020-06-02 MED ORDER — DULOXETINE HCL 60 MG PO CPEP: 60.0000 mg | ORAL_CAPSULE | Freq: Every day | ORAL | 4 refills | Status: DC

## 2020-06-02 NOTE — Assessment & Plan Note (Signed)
Ongoing.  Continue daily supplement and recheck level today.

## 2020-06-02 NOTE — Assessment & Plan Note (Signed)
Chronic, ongoing.  Continue current medication regimen and adjust as needed. Lipid panel today. 

## 2020-06-02 NOTE — Assessment & Plan Note (Signed)
Chronic, stable.  Recommend continue to perform gentle skin care and monitor for skin breakdown, if present notify provider.

## 2020-06-02 NOTE — Assessment & Plan Note (Signed)
Chronic, stable with BP at goal today in office.  Recommend he monitor BP at home at least three mornings a week + focus on DASH diet.  Continue current medication regimen and adjust as needed.  CMP and TSH today.  Return in 3 months.

## 2020-06-02 NOTE — Assessment & Plan Note (Signed)
Chronic, ongoing.  Continue current medication regimen and collaboration with urology.

## 2020-06-02 NOTE — Assessment & Plan Note (Signed)
Chronic, ongoing.  Followed by rheumatology with much improved functional status since initially meeting this patient one year ago.  Continue this collaboration and current regimen as prescribed by them.  Recent note reviewed.

## 2020-06-02 NOTE — Assessment & Plan Note (Signed)
Noted on CXR 07/20/19.  Continue statin and ASA daily for prevention.  Monitor closely. °

## 2020-06-02 NOTE — Addendum Note (Signed)
Addended by: [name] T on: 06/02/2020 08:50 PM   Modules accepted: Orders

## 2020-06-02 NOTE — Assessment & Plan Note (Signed)
Followed by urology, continue this collaboration and review notes when present. ? ?

## 2020-06-02 NOTE — Assessment & Plan Note (Signed)
Continue collaboration with urology.  Recent note reviewed.  PSA monitored by them.   

## 2020-06-02 NOTE — Progress Notes (Signed)
BP 124/80   Pulse (!) 102   Temp 98.4 F (36.9 C) (Oral)   Ht 5' 8.31" (1.735 m)   Wt 201 lb 12.8 oz (91.5 kg)   SpO2 97%   BMI 30.41 kg/m    Subjective:    Patient ID: Jason Contreras, male    DOB: 04/16/1941, 80 y.o.   MRN: [id number]  HPI: Jason Contreras is a 80 y.o. male presenting on 06/02/2020 for comprehensive medical examination. Current medical complaints include:none  He currently lives with: wife Interim Problems from his last visit: no   DIABETES Continues on Metformin 1000 MG BID and was still taking Actos until one week ago.  Last A1C was 6.4% October. Hypoglycemic episodes:no Polydipsia/polyuria: no Visual disturbance: no Chest pain: no Paresthesias: no Glucose Monitoring: no  Accucheck frequency: 3 times a week  Fasting glucose: 140-150  Post prandial:  Evening:  Before meals: Taking Insulin?: no  Long acting insulin:  Short acting insulin: Blood Pressure Monitoring: a few times a week Retinal Examination: Not up to Date Foot Exam: Up to Date Pneumovax: Not up to Date Influenza: Up to Date Aspirin: no   HYPERTENSION / HYPERLIPIDEMIA Continues on Atorvastatin 20 MG and Coreg 6.25 MG BID.  Has history of aortic atherosclerosis noted on CXR 07/20/19. Satisfied with current treatment? yes Duration of hypertension: chronic BP monitoring frequency: a few times a week BP range:  BP medication side effects: no Duration of hyperlipidemia: chronic Cholesterol medication side effects: no Cholesterol supplements: none Medication compliance: good compliance Aspirin: no Recent stressors: no Recurrent headaches: no Visual changes: no Palpitations: no Dyspnea: no Chest pain: no Lower extremity edema: no Dizzy/lightheaded: no  Current CrCl --- 57.64   RHEUMATOID ARTHRITIS He is currently continuing to be followed by rheumatology for RA of multiple sites with negative Rf.  Last visit 04/05/20.  Overall has been much improved since taking  Methotrexate. Pain control status: improved Duration: chronic Location: multiple areas What Activities task can be accomplished with current medication? ADL's daily Previous pain specialty evaluation: yes Non-narcotic analgesic meds: yes  ELEVATED PSA: Being followed by urology.  Last PSA 4.1 and last visit with urology on 04/19/20. Had negative biopsy.  Has history of kidney cancer to right side, had two operations on this.  Was started on Finasteride at urology visit last year and continues Tamsulosin.  VITAMIN D DEFICIENCY: Level was 36.6 recent visit.  Started on supplement at past visits.  No recent falls or fractures. Taking B12 supplements for history of low levels, recent level 1581.  Functional Status Survey: Is the patient deaf or have difficulty hearing?: No Does the patient have difficulty seeing, even when wearing glasses/contacts?: No Does the patient have difficulty concentrating, remembering, or making decisions?: No Does the patient have difficulty walking or climbing stairs?: No Does the patient have difficulty dressing or bathing?: No Does the patient have difficulty doing errands alone such as visiting a doctor's office or shopping?: No  FALL RISK: Fall Risk  06/02/2020 01/31/2020 06/11/2019 01/21/2019 01/08/2018  Falls in the past year? 0 0 0 0 No  Number falls in past yr: - - 0 - -  Injury with Fall? - - 0 - -  Risk for fall due to : - Medication side effect Impaired mobility;Orthopedic patient - -  Follow up - Falls evaluation completed;Education provided;Falls prevention discussed Falls evaluation completed;Education provided;Falls prevention discussed - -    Depression Screen Depression screen [place] 2/9 06/02/2020 01/31/2020 06/11/2019 05/28/2019 01/21/2019  Decreased Interest 0 0 0 0 0  Down, Depressed, Hopeless 0 0 0 0 0  PHQ - 2 Score 0 0 0 0 0  Altered sleeping 0 - - 2 -  Tired, decreased energy 0 - - 3 -  Change in appetite 0 - - 1 -  Feeling bad or failure  about yourself  0 - - 0 -  Trouble concentrating 0 - - 0 -  Moving slowly or fidgety/restless 0 - - 0 -  Suicidal thoughts 0 - - 0 -  PHQ-9 Score 0 - - 6 -  Difficult doing work/chores - - - Somewhat difficult -    Advanced Directives <no information>  Past Medical History:  Past Medical History:  Diagnosis Date  . Adenocarcinoma, renal cell ([place])   . Arthritis   . Collagen vascular disease ([place])   . Diabetes mellitus without complication ([place])   . Elevated PSA   . Headache   . Hematuria   . Hyperlipidemia   . Hypertension   . Neuropathy   . Obesity   . PONV (postoperative nausea and vomiting)   . Renal insufficiency   . Right renal mass     Surgical History:  Past Surgical History:  Procedure Laterality Date  . APPENDECTOMY    . CRYOABLATION  10/17/2017  . IR RADIOLOGIST EVAL & MGMT  07/17/2017  . IR RADIOLOGIST EVAL & MGMT  08/13/2017  . IR RADIOLOGIST EVAL & MGMT  11/12/2017  . IR RADIOLOGIST EVAL & MGMT  03/05/2018  . RADIOLOGY WITH ANESTHESIA Right 10/17/2017   Procedure: CT WITH ANESTHESIA RENAL CRYOABLATION;  Surgeon: [name], MD;  Location: [facility];  Service: Anesthesiology;  Laterality: Right;  . ROBOTIC ASSITED PARTIAL NEPHRECTOMY Right 12/12/2015   Procedure: ROBOTIC ASSITED PARTIAL NEPHRECTOMY;  Surgeon: [name], MD;  Location: [facility] ORS;  Service: Urology;  Laterality: Right;    Medications:  Current Outpatient Medications on File Prior to Visit  Medication Sig  . atorvastatin (LIPITOR) 20 MG tablet TAKE 1 TABLET BY MOUTH  DAILY AT 6 PM.  . carvedilol (COREG) 6.25 MG tablet Take 1 tablet (6.25 mg total) by mouth 2 (two) times daily.  . cholecalciferol (VITAMIN D3) 25 MCG (1000 UNIT) tablet Take 2,000 Units by mouth daily.   . DULoxetine (CYMBALTA) 60 MG capsule TAKE 1 CAPSULE BY MOUTH  DAILY  . finasteride (PROSCAR) 5 MG tablet Take 1 tablet (5 mg total) by mouth daily.  . folic acid (FOLVITE) 1 MG tablet Take by mouth.  . gabapentin  (NEURONTIN) 600 MG tablet TAKE 1 TABLET BY MOUTH 3  TIMES DAILY  . lidocaine (LIDODERM) 5 % Place 1 patch onto the skin daily. Remove & Discard patch within 12 hours or as directed by MD  . metFORMIN (GLUCOPHAGE) 500 MG tablet TAKE 2 TABLETS BY MOUTH  TWICE DAILY  . methotrexate (RHEUMATREX) 2.5 MG tablet Take by mouth.  . mometasone (ELOCON) 0.1 % cream APPLY  CREAM TOPICALLY ONCE DAILY  . tamsulosin (FLOMAX) 0.4 MG CAPS capsule Take 1 capsule (0.4 mg total) by mouth daily.  . vitamin B-12 (CYANOCOBALAMIN) 1000 MCG tablet Take 1,000 mcg by mouth daily.    No current facility-administered medications on file prior to visit.    Allergies:  Allergies  Allergen Reactions  . Morphine Nausea And Vomiting  . Morphine And Related Nausea And Vomiting    Social History:  Social History   Socioeconomic History  . Marital status: Married    Spouse name: Not  on file  . Number of children: Not on file  . Years of education: 3 years college   . Highest education level: Some college, no degree  Occupational History  . Not on file  Tobacco Use  . Smoking status: Never Smoker  . Smokeless tobacco: Never Used  Vaping Use  . Vaping Use: Never used  Substance and Sexual Activity  . Alcohol use: No  . Drug use: No  . Sexual activity: Not Currently  Other Topics Concern  . Not on file  Social History Narrative  . Not on file   Social Determinants of Health   Financial Resource Strain: Low Risk   . Difficulty of Paying Living Expenses: Not hard at all  Food Insecurity: No Food Insecurity  . Worried About Charity fundraiser in the Last Year: Never true  . Ran Out of Food in the Last Year: Never true  Transportation Needs: No Transportation Needs  . Lack of Transportation (Medical): No  . Lack of Transportation (Non-Medical): No  Physical Activity: Inactive  . Days of Exercise per Week: 0 days  . Minutes of Exercise per Session: 0 min  Stress: No Stress Concern Present  . Feeling of  Stress : Not at all  Social Connections: Not on file  Intimate Partner Violence: Not on file   Social History   Tobacco Use  Smoking Status Never Smoker  Smokeless Tobacco Never Used   Social History   Substance and Sexual Activity  Alcohol Use No    Family History:  Family History  Problem Relation Age of Onset  . Bone cancer Mother   . Cancer Mother   . Cancer Father   . Stroke Brother   . Kidney disease Neg Hx     Past medical history, surgical history, medications, allergies, family history and social history reviewed with patient today and changes made to appropriate areas of the chart.   Review of Systems - negative All other ROS negative except what is listed above and in the HPI.      Objective:    BP 124/80   Pulse (!) 102   Temp 98.4 F (36.9 C) (Oral)   Ht 5' 8.31" (1.735 m)   Wt 201 lb 12.8 oz (91.5 kg)   SpO2 97%   BMI 30.41 kg/m   Wt Readings from Last 3 Encounters:  06/02/20 201 lb 12.8 oz (91.5 kg)  02/28/20 203 lb (92.1 kg)  01/31/20 208 lb (94.3 kg)    Physical Exam Vitals and nursing note reviewed.  Constitutional:      General: He is awake. He is not in acute distress.    Appearance: He is well-developed and overweight. He is not ill-appearing.  HENT:     Head: Normocephalic and atraumatic.     Right Ear: Hearing, tympanic membrane, ear canal and external ear normal. No drainage.     Left Ear: Hearing, tympanic membrane, ear canal and external ear normal. No drainage.     Nose: Nose normal.     Mouth/Throat:     Pharynx: Uvula midline.  Eyes:     General: Lids are normal.        Right eye: No discharge.        Left eye: No discharge.     Extraocular Movements: Extraocular movements intact.     Conjunctiva/sclera: Conjunctivae normal.     Pupils: Pupils are equal, round, and reactive to light.     Visual Fields: Right eye visual fields  normal and left eye visual fields normal.  Neck:     Thyroid: No thyromegaly.     Vascular: No  carotid bruit or JVD.     Trachea: Trachea normal.  Cardiovascular:     Rate and Rhythm: Normal rate. Rhythm irregular.     Heart sounds: Normal heart sounds, S1 normal and S2 normal. No murmur heard. No gallop.   Pulmonary:     Effort: Pulmonary effort is normal. No accessory muscle usage or respiratory distress.     Breath sounds: Normal breath sounds.  Abdominal:     General: Bowel sounds are normal.     Palpations: Abdomen is soft. There is no hepatomegaly or splenomegaly.     Tenderness: There is no abdominal tenderness.  Genitourinary:    Comments: Deferred per patient request. Musculoskeletal:        General: Normal range of motion.     Cervical back: Normal range of motion and neck supple.     Right lower leg: No edema.     Left lower leg: No edema.  Lymphadenopathy:     Head:     Right side of head: No submental, submandibular, tonsillar, preauricular or posterior auricular adenopathy.     Left side of head: No submental, submandibular, tonsillar, preauricular or posterior auricular adenopathy.     Cervical: No cervical adenopathy.  Skin:    General: Skin is warm and dry.     Capillary Refill: Capillary refill takes less than 2 seconds.     Findings: No rash.     Comments: A few scattered pale purple bruises to bilateral upper extremity.  Neurological:     Mental Status: He is alert and oriented to person, place, and time.     Cranial Nerves: Cranial nerves are intact.     Gait: Gait is intact.     Deep Tendon Reflexes: Reflexes are normal and symmetric.     Reflex Scores:      Brachioradialis reflexes are 2+ on the right side and 2+ on the left side.      Patellar reflexes are 2+ on the right side and 2+ on the left side. Psychiatric:        Attention and Perception: Attention normal.        Mood and Affect: Mood normal.        Speech: Speech normal.        Behavior: Behavior normal. Behavior is cooperative.        Thought Content: Thought content normal.         Cognition and Memory: Cognition normal.        Judgment: Judgment normal.    EKG in office today with rate 90, normal axis, NSR with x 2 PAC noted.  Diabetic Foot Exam - Simple   Simple Foot Form Visual Inspection See comments: Yes Sensation Testing See comments: Yes Pulse Check See comments: Yes Comments 1+ pulses bilaterally PT and DP.  Mild xerosis noted.  Thickened yellow toenails.  Sensation right 7/10 and left 7/10.        Results for orders placed or performed in visit on 06/02/20  Bayer DCA Hb A1c Waived  Result Value Ref Range   HB A1C (BAYER DCA - WAIVED) 6.5 <7.0 %      Assessment & Plan:   Problem List Items Addressed This Visit      Cardiovascular and Mediastinum   Hypertension associated with diabetes ([place])    Chronic, stable with BP at goal today in  office.  Recommend he monitor BP at home at least three mornings a week + focus on DASH diet.  Continue current medication regimen and adjust as needed.  CMP and TSH today.  Return in 3 months.      Relevant Orders   Bayer DCA Hb A1c Waived (Completed)   Microalbumin, Urine Waived   CBC with Differential/Platelet   Comprehensive metabolic panel   TSH   Senile purpura (HCC)    Chronic, stable.  Recommend continue to perform gentle skin care and monitor for skin breakdown, if present notify provider.      Aortic atherosclerosis ([place])    Noted on CXR 07/20/19.  Continue statin and ASA daily for prevention.  Monitor closely.        Endocrine   DM due to underlying condition with diabetic nephropathy ([place]) - Primary    Chronic, ongoing in patient with history of renal cell CA.  A1C today 6.5%, remains stable.  Continue current medication regimen and adjust as needed (reminded him not to take Actos, this was discontinued during past visit),  Monitor BS daily in morning at home.  Continue Metformin, consider use of alternate options such as GLP if elevations present, due to his age and risk for hypoglycemia.  Could trial SGLT2, but with urinary symptoms may not tolerate.  Return to office in 3 months.      Relevant Orders   Bayer DCA Hb A1c Waived (Completed)   Microalbumin, Urine Waived   Hyperlipidemia associated with type 2 diabetes mellitus (HCC)    Chronic, ongoing.  Continue current medication regimen and adjust as needed.  Lipid panel today.      Relevant Orders   Bayer DCA Hb A1c Waived (Completed)   Comprehensive metabolic panel   Lipid Panel w/o Chol/HDL Ratio     Musculoskeletal and Integument   Rheumatoid arthritis of multiple sites with negative rheumatoid factor (HCC)    Chronic, ongoing.  Followed by rheumatology with much improved functional status since initially meeting this patient one year ago.  Continue this collaboration and current regimen as prescribed by them.  Recent note reviewed.        Genitourinary   BPH with obstruction/lower urinary tract symptoms    Chronic, ongoing.  Continue current medication regimen and collaboration with urology.          Other   Elevated PSA    Continue collaboration with urology.  Recent note reviewed.  PSA monitored by them.      Personal history of renal cancer    Followed by urology, continue this collaboration and review notes when present.       Vitamin D deficiency    Ongoing.  Continue daily supplement and recheck level today.      Relevant Orders   VITAMIN D 25 Hydroxy (Vit-D Deficiency, Fractures)   B12 deficiency    Ongoing and improving with supplement with recent level at goal.  Recheck today and consider reducing or discontinuing supplement as needed.      Relevant Orders   Vitamin B12    Other Visit Diagnoses    Irregular heartbeat       EKG remains at baseline today with NSR and occasional PAC.   Relevant Orders   EKG 12-Lead (Completed)   Annual physical exam       Annual labs obtained today.       Discussed aspirin prophylaxis for myocardial infarction prevention and decision was  refuses  LABORATORY TESTING:  Health maintenance labs  ordered today as discussed above.   The natural history of prostate cancer and ongoing controversy regarding screening and potential treatment outcomes of prostate cancer has been discussed with the patient. The meaning of a false positive PSA and a false negative PSA has been discussed. He indicates understanding of the limitations of this screening test and wishes  to proceed with screening PSA testing -- followed by urology.   IMMUNIZATIONS:   - Tdap: Tetanus vaccination status reviewed: needed, obtain next visit - Influenza: Up to date - Pneumovax: Up to date - Prevnar: Refused - Zostavax vaccine: will think about this and alert provider if requested  SCREENING: - Colonoscopy: Not applicable  Discussed with patient purpose of the colonoscopy is to detect colon cancer at curable precancerous or early stages   - AAA Screening: Not applicable  -Hearing Test: Not applicable  -Spirometry: Not applicable   PATIENT COUNSELING:    Sexuality: Discussed sexually transmitted diseases, partner selection, use of condoms, avoidance of unintended pregnancy  and contraceptive alternatives.   Advised to avoid cigarette smoking.  I discussed with the patient that most people either abstain from alcohol or drink within safe limits (<=14/week and <=4 drinks/occasion for males, <=7/weeks and <= 3 drinks/occasion for females) and that the risk for alcohol disorders and other health effects rises proportionally with the number of drinks per week and how often a drinker exceeds daily limits.  Discussed cessation/primary prevention of drug use and availability of treatment for abuse.   Diet: Encouraged to adjust caloric intake to maintain  or achieve ideal body weight, to reduce intake of dietary saturated fat and total fat, to limit sodium intake by avoiding high sodium foods and not adding table salt, and to maintain adequate dietary potassium and  calcium preferably from fresh fruits, vegetables, and low-fat dairy products.    Stressed the importance of regular exercise  Injury prevention: Discussed safety belts, safety helmets, smoke detector, smoking near bedding or upholstery.   Dental health: Discussed importance of regular tooth brushing, flossing, and dental visits.   Follow up plan: NEXT PREVENTATIVE PHYSICAL DUE IN 1 YEAR. Return in about 3 months (around 08/31/2020) for T2DM, HTN/HLD.

## 2020-06-02 NOTE — Assessment & Plan Note (Signed)
Ongoing and improving with supplement with recent level at goal.  Recheck today and consider reducing or discontinuing supplement as needed.

## 2020-06-02 NOTE — Patient Instructions (Signed)
Diabetes Mellitus and Nutrition, Adult When you have diabetes, or diabetes mellitus, it is very important to have healthy eating habits because your blood sugar (glucose) levels are greatly affected by what you eat and drink. Eating healthy foods in the right amounts, at about the same times every day, can help you:  Control your blood glucose.  Lower your risk of heart disease.  Improve your blood pressure.  Reach or maintain a healthy weight. What can affect my meal plan? Every person with diabetes is different, and each person has different needs for a meal plan. Your health care provider may recommend that you work with a dietitian to make a meal plan that is best for you. Your meal plan may vary depending on factors such as:  The calories you need.  The medicines you take.  Your weight.  Your blood glucose, blood pressure, and cholesterol levels.  Your activity level.  Other health conditions you have, such as heart or kidney disease. How do carbohydrates affect me? Carbohydrates, also called carbs, affect your blood glucose level more than any other type of food. Eating carbs naturally raises the amount of glucose in your blood. Carb counting is a method for keeping track of how many carbs you eat. Counting carbs is important to keep your blood glucose at a healthy level, especially if you use insulin or take certain oral diabetes medicines. It is important to know how many carbs you can safely have in each meal. This is different for every person. Your dietitian can help you calculate how many carbs you should have at each meal and for each snack. How does alcohol affect me? Alcohol can cause a sudden decrease in blood glucose (hypoglycemia), especially if you use insulin or take certain oral diabetes medicines. Hypoglycemia can be a life-threatening condition. Symptoms of hypoglycemia, such as sleepiness, dizziness, and confusion, are similar to symptoms of having too much  alcohol.  Do not drink alcohol if: ? Your health care provider tells you not to drink. ? You are pregnant, may be pregnant, or are planning to become pregnant.  If you drink alcohol: ? Do not drink on an empty stomach. ? Limit how much you use to:  0-1 drink a day for women.  0-2 drinks a day for men. ? Be aware of how much alcohol is in your drink. In the [place] one drink equals one 12 oz bottle of beer (355 mL), one 5 oz glass of wine (148 mL), or one 1 oz glass of hard liquor (44 mL). ? Keep yourself hydrated with water, diet soda, or unsweetened iced tea.  Keep in mind that regular soda, juice, and other mixers may contain a lot of sugar and must be counted as carbs. What are tips for following this plan? Reading food labels  Start by checking the serving size on the "Nutrition Facts" label of packaged foods and drinks. The amount of calories, carbs, fats, and other nutrients listed on the label is based on one serving of the item. Many items contain more than one serving per package.  Check the total grams (g) of carbs in one serving. You can calculate the number of servings of carbs in one serving by dividing the total carbs by 15. For example, if a food has 30 g of total carbs per serving, it would be equal to 2 servings of carbs.  Check the number of grams (g) of saturated fats and trans fats in one serving. Choose foods that have   a low amount or none of these fats.  Check the number of milligrams (mg) of salt (sodium) in one serving. Most people should limit total sodium intake to less than 2,300 mg per day.  Always check the nutrition information of foods labeled as "low-fat" or "nonfat." These foods may be higher in added sugar or refined carbs and should be avoided.  Talk to your dietitian to identify your daily goals for nutrients listed on the label. Shopping  Avoid buying canned, pre-made, or processed foods. These foods tend to be high in fat, sodium, and added  sugar.  Shop around the outside edge of the grocery store. This is where you will most often find fresh fruits and vegetables, bulk grains, fresh meats, and fresh dairy. Cooking  Use low-heat cooking methods, such as baking, instead of high-heat cooking methods like deep frying.  Cook using healthy oils, such as olive, canola, or sunflower oil.  Avoid cooking with butter, cream, or high-fat meats. Meal planning  Eat meals and snacks regularly, preferably at the same times every day. Avoid going long periods of time without eating.  Eat foods that are high in fiber, such as fresh fruits, vegetables, beans, and whole grains. Talk with your dietitian about how many servings of carbs you can eat at each meal.  Eat 4-6 oz (112-168 g) of lean protein each day, such as lean meat, chicken, fish, eggs, or tofu. One ounce (oz) of lean protein is equal to: ? 1 oz (28 g) of meat, chicken, or fish. ? 1 egg. ?  cup (62 g) of tofu.  Eat some foods each day that contain healthy fats, such as avocado, nuts, seeds, and fish.   What foods should I eat? Fruits Berries. Apples. Oranges. Peaches. Apricots. Plums. Grapes. Mango. Papaya. Pomegranate. Kiwi. Cherries. Vegetables Lettuce. Spinach. Leafy greens, including kale, chard, collard greens, and mustard greens. Beets. Cauliflower. Cabbage. Broccoli. Carrots. Green beans. Tomatoes. Peppers. Onions. Cucumbers. Brussels sprouts. Grains Whole grains, such as whole-wheat or whole-grain bread, crackers, tortillas, cereal, and pasta. Unsweetened oatmeal. Quinoa. Brown or wild rice. Meats and other proteins Seafood. Poultry without skin. Lean cuts of poultry and beef. Tofu. Nuts. Seeds. Dairy Low-fat or fat-free dairy products such as milk, yogurt, and cheese. The items listed above may not be a complete list of foods and beverages you can eat. Contact a dietitian for more information. What foods should I avoid? Fruits Fruits canned with  syrup. Vegetables Canned vegetables. Frozen vegetables with butter or cream sauce. Grains Refined white flour and flour products such as bread, pasta, snack foods, and cereals. Avoid all processed foods. Meats and other proteins Fatty cuts of meat. Poultry with skin. Breaded or fried meats. Processed meat. Avoid saturated fats. Dairy Full-fat yogurt, cheese, or milk. Beverages Sweetened drinks, such as soda or iced tea. The items listed above may not be a complete list of foods and beverages you should avoid. Contact a dietitian for more information. Questions to ask a health care provider  Do I need to meet with a diabetes educator?  Do I need to meet with a dietitian?  What number can I call if I have questions?  When are the best times to check my blood glucose? Where to find more information:  American Diabetes Association: diabetes.org  Academy of Nutrition and Dietetics: www.eatright.org  National Institute of Diabetes and Digestive and Kidney Diseases: www.niddk.nih.gov  Association of Diabetes Care and Education Specialists: www.diabeteseducator.org Summary  It is important to have healthy eating   habits because your blood sugar (glucose) levels are greatly affected by what you eat and drink.  A healthy meal plan will help you control your blood glucose and maintain a healthy lifestyle.  Your health care provider may recommend that you work with a dietitian to make a meal plan that is best for you.  Keep in mind that carbohydrates (carbs) and alcohol have immediate effects on your blood glucose levels. It is important to count carbs and to use alcohol carefully. This information is not intended to replace advice given to you by your health care provider. Make sure you discuss any questions you have with your health care provider. Document Revised: 03/30/2019 Document Reviewed: 03/30/2019 Elsevier Patient Education  2021 Elsevier Inc.  

## 2020-06-02 NOTE — Assessment & Plan Note (Signed)
Chronic, ongoing in patient with history of renal cell CA.  A1C today 6.5%, remains stable.  Continue current medication regimen and adjust as needed (reminded him not to take Actos, this was discontinued during past visit),  Monitor BS daily in morning at home.  Continue Metformin, consider use of alternate options such as GLP if elevations present, due to his age and risk for hypoglycemia. Could trial SGLT2, but with urinary symptoms may not tolerate.  Return to office in 3 months.

## 2020-06-03 LAB — TSH: TSH: 1.47 u[IU]/mL (ref 0.450–4.500)

## 2020-06-03 LAB — CBC WITH DIFFERENTIAL/PLATELET
Basophils Absolute: 0 10*3/uL (ref 0.0–0.2)
Basos: 1 %
EOS (ABSOLUTE): 0.1 10*3/uL (ref 0.0–0.4)
Eos: 1 %
Hematocrit: 42.1 % (ref 37.5–51.0)
Hemoglobin: 13.8 g/dL (ref 13.0–17.7)
Immature Grans (Abs): 0 10*3/uL (ref 0.0–0.1)
Immature Granulocytes: 1 %
Lymphocytes Absolute: 1.1 10*3/uL (ref 0.7–3.1)
Lymphs: 17 %
MCH: 32.5 pg (ref 26.6–33.0)
MCHC: 32.8 g/dL (ref 31.5–35.7)
MCV: 99 fL — ABNORMAL HIGH (ref 79–97)
Monocytes Absolute: 0.4 10*3/uL (ref 0.1–0.9)
Monocytes: 7 %
Neutrophils Absolute: 4.7 10*3/uL (ref 1.4–7.0)
Neutrophils: 73 %
Platelets: 243 10*3/uL (ref 150–450)
RBC: 4.24 x10E6/uL (ref 4.14–5.80)
RDW: 13.9 % (ref 11.6–15.4)
WBC: 6.4 10*3/uL (ref 3.4–10.8)

## 2020-06-03 LAB — COMPREHENSIVE METABOLIC PANEL
ALT: 21 IU/L (ref 0–44)
AST: 21 IU/L (ref 0–40)
Albumin/Globulin Ratio: 2.1 (ref 1.2–2.2)
Albumin: 4.1 g/dL (ref 3.7–4.7)
Alkaline Phosphatase: 68 IU/L (ref 44–121)
BUN/Creatinine Ratio: 15 (ref 10–24)
BUN: 12 mg/dL (ref 8–27)
Bilirubin Total: 0.5 mg/dL (ref 0.0–1.2)
CO2: 22 mmol/L (ref 20–29)
Calcium: 9.4 mg/dL (ref 8.6–10.2)
Chloride: 101 mmol/L (ref 96–106)
Creatinine, Ser: 0.82 mg/dL (ref 0.76–1.27)
GFR calc Af Amer: 99 mL/min/{1.73_m2} (ref >59)
GFR calc non Af Amer: 85 mL/min/{1.73_m2} (ref >59)
Globulin, Total: 2 g/dL (ref 1.5–4.5)
Glucose: 196 mg/dL — ABNORMAL HIGH (ref 65–99)
Potassium: 4.7 mmol/L (ref 3.5–5.2)
Sodium: 140 mmol/L (ref 134–144)
Total Protein: 6.1 g/dL (ref 6.0–8.5)

## 2020-06-03 LAB — LIPID PANEL W/O CHOL/HDL RATIO
Cholesterol, Total: 138 mg/dL (ref 100–199)
HDL: 44 mg/dL (ref >39)
LDL Chol Calc (NIH): 67 mg/dL (ref 0–99)
Triglycerides: 155 mg/dL — ABNORMAL HIGH (ref 0–149)
VLDL Cholesterol Cal: 27 mg/dL (ref 5–40)

## 2020-06-03 LAB — VITAMIN D 25 HYDROXY (VIT D DEFICIENCY, FRACTURES): Vit D, 25-Hydroxy: 28.6 ng/mL — ABNORMAL LOW (ref 30.0–100.0)

## 2020-06-03 LAB — VITAMIN B12: Vitamin B-12: 334 pg/mL (ref 232–1245)

## 2020-06-03 NOTE — Progress Notes (Signed)
Contacted via MyChart   Good evening Jason Contreras, your labs have returned.  Overall they remain stable with normal kidney and liver function + normal thyroid lab.  Cholesterol levels show LDL at goal for stroke prevention, continue statin daily.  Vitamin D and Vitamin B12 levels are a bit on lower side again.  I recommend taking Vitamin B12 1000 MCG every other day and Vitamin D3 2000 units every day to help maintain these in stable ranges, we will recheck next visit.  Any questions? Keep being awesome!!  Thank you for allowing me to participate in your care. Kindest regards, Jacquez Sheetz

## 2020-06-07 ENCOUNTER — Ambulatory Visit (INDEPENDENT_AMBULATORY_CARE_PROVIDER_SITE_OTHER): Payer: Medicare Other | Admitting: Pharmacist

## 2020-06-07 DIAGNOSIS — E0821 Diabetes mellitus due to underlying condition with diabetic nephropathy: Secondary | ICD-10-CM

## 2020-06-07 DIAGNOSIS — I152 Hypertension secondary to endocrine disorders: Secondary | ICD-10-CM

## 2020-06-07 DIAGNOSIS — E538 Deficiency of other specified B group vitamins: Secondary | ICD-10-CM

## 2020-06-07 DIAGNOSIS — E1159 Type 2 diabetes mellitus with other circulatory complications: Secondary | ICD-10-CM | POA: Diagnosis not present

## 2020-06-07 NOTE — Patient Instructions (Addendum)
Visit Information  It was a pleasure speaking with you today. Thank you for letting me be part of your clinical team. Please call with any questions or concerns.   Goals Addressed            This Visit's Progress   . Pharmacy Care Plan       CARE PLAN ENTRY (see longitudinal plan of care for additional care plan information)  Current Barriers:  . Chronic Disease Management support, education, and care coordination needs related to Hypertension, Hyperlipidemia, Diabetes, BPH, and Rheumatoid Arthritis   Hypertension BP Readings from Last 3 Encounters:  06/02/20 124/80  04/19/20 127/74  02/28/20 137/84   . Pharmacist Clinical Goal(s): o Over the next 60 days, patient will work with PharmD and providers to maintain BP goal <130/80 . Current regimen:  o Carvedilol 6.25mg  bid . Interventions: o I recommend arm cuffs over wrist cuffs for accuracy. Check your blood pressure ~3-4 times weekly. Make sure you are sitting down for at least 5 minutes before, resting calmly, with your feet flat on the floor.   . Patient self care activities - Over the next  60  days, patient will: o Check BP 3- 4 times weekly, document, and provide at future appointments o Ensure daily salt intake < 2300 mg/day  Hyperlipidemia Lab Results  Component Value Date/Time   LDLCALC 67 06/02/2020 02:54 PM   . Pharmacist Clinical Goal(s): o Over the next 60 days, patient will work with PharmD and providers to maintain LDL goal < 70 . Current regimen:  o Atorvastatin 20 mg daily  Interventions: o Provided diet and exercise counseling. . Patient self care activities - Over the next 60 days, patient will: o Follow DASH diet  Diabetes Lab Results  Component Value Date/Time   HGBA1C 6.5 06/02/2020 02:53 PM   HGBA1C 6.4 02/28/2020 02:15 PM   HGBA1C 6.3 12/27/2015 12:00 AM   . Pharmacist Clinical Goal(s): o Over the next 60 days, patient will work with PharmD and providers to achieve A1c goal <7% . Current  regimen:  o Metformin 1000 mg bid  . Interventions: o Provided diet and exercise counseling.  o Checking BG daily. He and spouse check together three days per week. . Patient self care activities - Over the next 60 days, patient will: o Check blood sugar once daily and 3-4 times weekly document, and provide at future appointments o Contact provider with any episodes of hypoglycemia o Attend scheduled appointments   Medication management . Pharmacist Clinical Goal(s): o Over the next 60 days, patient will work with PharmD and providers to achieve optimal medication adherence . [facility]: [facility] . Interventions o Comprehensive medication review performed. o Continue current medication management strategy . Patient self care activities - Over the next 60 days, patient will: o Focus on medication adherence by fill dates o Take medications as prescribed o Report any questions or concerns to PharmD and/or provider(s)  Initial goal documentation        The patient verbalized understanding of instructions, educational materials, and care plan provided today and agreed to receive a mailed copy of patient instructions, educational materials, and care plan.   Telephone follow up appointment with pharmacy team member scheduled for: 09/04/20  [place] Pod. Sylvan Sookdeo PharmD, BCPS Clinical Pharmacist [phone]  Diabetes Mellitus and Nutrition, Adult When you have diabetes, or diabetes mellitus, it is very important to have healthy eating habits because your blood sugar (glucose) levels are greatly affected by what you  eat and drink. Eating healthy foods in the right amounts, at about the same times every day, can help you:  Control your blood glucose.  Lower your risk of heart disease.  Improve your blood pressure.  Reach or maintain a healthy weight. What can affect my meal plan? Every person with diabetes is different, and each person has different needs for a meal  plan. Your health care provider may recommend that you work with a dietitian to make a meal plan that is best for you. Your meal plan may vary depending on factors such as:  The calories you need.  The medicines you take.  Your weight.  Your blood glucose, blood pressure, and cholesterol levels.  Your activity level.  Other health conditions you have, such as heart or kidney disease. How do carbohydrates affect me? Carbohydrates, also called carbs, affect your blood glucose level more than any other type of food. Eating carbs naturally raises the amount of glucose in your blood. Carb counting is a method for keeping track of how many carbs you eat. Counting carbs is important to keep your blood glucose at a healthy level, especially if you use insulin or take certain oral diabetes medicines. It is important to know how many carbs you can safely have in each meal. This is different for every person. Your dietitian can help you calculate how many carbs you should have at each meal and for each snack. How does alcohol affect me? Alcohol can cause a sudden decrease in blood glucose (hypoglycemia), especially if you use insulin or take certain oral diabetes medicines. Hypoglycemia can be a life-threatening condition. Symptoms of hypoglycemia, such as sleepiness, dizziness, and confusion, are similar to symptoms of having too much alcohol.  Do not drink alcohol if: ? Your health care provider tells you not to drink. ? You are pregnant, may be pregnant, or are planning to become pregnant.  If you drink alcohol: ? Do not drink on an empty stomach. ? Limit how much you use to:  0-1 drink a day for women.  0-2 drinks a day for men. ? Be aware of how much alcohol is in your drink. In the [place] one drink equals one 12 oz bottle of beer (355 mL), one 5 oz glass of wine (148 mL), or one 1 oz glass of hard liquor (44 mL). ? Keep yourself hydrated with water, diet soda, or unsweetened iced  tea.  Keep in mind that regular soda, juice, and other mixers may contain a lot of sugar and must be counted as carbs. What are tips for following this plan? Reading food labels  Start by checking the serving size on the "Nutrition Facts" label of packaged foods and drinks. The amount of calories, carbs, fats, and other nutrients listed on the label is based on one serving of the item. Many items contain more than one serving per package.  Check the total grams (g) of carbs in one serving. You can calculate the number of servings of carbs in one serving by dividing the total carbs by 15. For example, if a food has 30 g of total carbs per serving, it would be equal to 2 servings of carbs.  Check the number of grams (g) of saturated fats and trans fats in one serving. Choose foods that have a low amount or none of these fats.  Check the number of milligrams (mg) of salt (sodium) in one serving. Most people should limit total sodium intake to less than 2,300  mg per day.  Always check the nutrition information of foods labeled as "low-fat" or "nonfat." These foods may be higher in added sugar or refined carbs and should be avoided.  Talk to your dietitian to identify your daily goals for nutrients listed on the label. Shopping  Avoid buying canned, pre-made, or processed foods. These foods tend to be high in fat, sodium, and added sugar.  Shop around the outside edge of the grocery store. This is where you will most often find fresh fruits and vegetables, bulk grains, fresh meats, and fresh dairy. Cooking  Use low-heat cooking methods, such as baking, instead of high-heat cooking methods like deep frying.  Cook using healthy oils, such as olive, canola, or sunflower oil.  Avoid cooking with butter, cream, or high-fat meats. Meal planning  Eat meals and snacks regularly, preferably at the same times every day. Avoid going long periods of time without eating.  Eat foods that are high in  fiber, such as fresh fruits, vegetables, beans, and whole grains. Talk with your dietitian about how many servings of carbs you can eat at each meal.  Eat 4-6 oz (112-168 g) of lean protein each day, such as lean meat, chicken, fish, eggs, or tofu. One ounce (oz) of lean protein is equal to: ? 1 oz (28 g) of meat, chicken, or fish. ? 1 egg. ?  cup (62 g) of tofu.  Eat some foods each day that contain healthy fats, such as avocado, nuts, seeds, and fish.   What foods should I eat? Fruits Berries. Apples. Oranges. Peaches. Apricots. Plums. Grapes. Mango. Papaya. Pomegranate. Kiwi. Cherries. Vegetables Lettuce. Spinach. Leafy greens, including kale, chard, collard greens, and mustard greens. Beets. Cauliflower. Cabbage. Broccoli. Carrots. Green beans. Tomatoes. Peppers. Onions. Cucumbers. Brussels sprouts. Grains Whole grains, such as whole-wheat or whole-grain bread, crackers, tortillas, cereal, and pasta. Unsweetened oatmeal. Quinoa. Brown or wild rice. Meats and other proteins Seafood. Poultry without skin. Lean cuts of poultry and beef. Tofu. Nuts. Seeds. Dairy Low-fat or fat-free dairy products such as milk, yogurt, and cheese. The items listed above may not be a complete list of foods and beverages you can eat. Contact a dietitian for more information. What foods should I avoid? Fruits Fruits canned with syrup. Vegetables Canned vegetables. Frozen vegetables with butter or cream sauce. Grains Refined white flour and flour products such as bread, pasta, snack foods, and cereals. Avoid all processed foods. Meats and other proteins Fatty cuts of meat. Poultry with skin. Breaded or fried meats. Processed meat. Avoid saturated fats. Dairy Full-fat yogurt, cheese, or milk. Beverages Sweetened drinks, such as soda or iced tea. The items listed above may not be a complete list of foods and beverages you should avoid. Contact a dietitian for more information. Questions to ask a health  care provider  Do I need to meet with a diabetes educator?  Do I need to meet with a dietitian?  What number can I call if I have questions?  When are the best times to check my blood glucose? Where to find more information:  American Diabetes Association: diabetes.org  Academy of Nutrition and Dietetics: www.eatright.[employer] of Diabetes and Digestive and Kidney Diseases: [employer]  Association of Diabetes Care and Education Specialists: www.diabeteseducator.org Summary  It is important to have healthy eating habits because your blood sugar (glucose) levels are greatly affected by what you eat and drink.  A healthy meal plan will help you control your blood glucose and maintain a healthy  lifestyle.  Your health care provider may recommend that you work with a dietitian to make a meal plan that is best for you.  Keep in mind that carbohydrates (carbs) and alcohol have immediate effects on your blood glucose levels. It is important to count carbs and to use alcohol carefully. This information is not intended to replace advice given to you by your health care provider. Make sure you discuss any questions you have with your health care provider. Document Revised: 03/30/2019 Document Reviewed: 03/30/2019 Elsevier Patient Education  2021 [employer].

## 2020-06-07 NOTE — Chronic Care Management (AMB) (Signed)
Chronic Care Management Pharmacy  Name: Jason Contreras  MRN: [id number] DOB: 1940/10/07   Chief Complaint/ HPI  Jason Contreras,  80 y.o. , male presents for their Follow-Up CCM visit with the clinical pharmacist via telephone.  PCP : Venita Lick, NP Patient Care Team: Venita Lick, NP as PCP - General (Nurse Practitioner) [name], MD as Consulting Physician (Urology) [name], MD as Consulting Physician (Interventional Radiology) Vanita Ingles, RN as Case Manager ([place]) Vladimir Faster, [facility] as Pharmacist (Pharmacist)  Their chronic conditions include: Hypertension, Hyperlipidemia, Diabetes, BPH, seronegative rheumatoid  Arthritis and history of renal cancer with partial nephrectomy.  Office Visits: 06/02/20- Dawson Bills, DNP- bloodwork- vit d 2000 un qd, b12 1064mg qod 10/11/19- JMarnee Guarneri NP - labs, inc vit d to 2000u daily, upcoming visit 02/28/20 08/18/19- Catie TDarnelle Maffucci, PharmD- CCM- receiving Medicare LIS  Consult Visit: 04/19/20- Dr. BErlene Quan urology- PSA, renal cell rpt imaging 3/22--ct abd 04/05/20- Dr. PPosey Pronto rheumatology - folate, MTX 15 mg  12/08/19- Dr. PPosey Pronto Rheumatology - RA stage 2, continue Arava 263mqd 09/15/19 - Dr. BrElmarie ShileyUrology - finasteride, flomax   Allergies  Allergen Reactions  . Morphine Nausea And Vomiting  . Morphine And Related Nausea And Vomiting    Medications: Outpatient Encounter Medications as of 06/07/2020  Medication Sig  . atorvastatin (LIPITOR) 20 MG tablet TAKE 1 TABLET BY MOUTH  DAILY AT 6 PM.  . carvedilol (COREG) 6.25 MG tablet Take 1 tablet (6.25 mg total) by mouth 2 (two) times daily.  . cholecalciferol (VITAMIN D3) 25 MCG (1000 UNIT) tablet Take 2,000 Units by mouth daily.   . DULoxetine (CYMBALTA) 60 MG capsule Take 1 capsule (60 mg total) by mouth daily.  . finasteride (PROSCAR) 5 MG tablet Take 1 tablet (5 mg total) by mouth daily.  . folic acid (FOLVITE) 1 MG tablet Take by  mouth.  . gabapentin (NEURONTIN) 600 MG tablet TAKE 1 TABLET BY MOUTH 3  TIMES DAILY  . lidocaine (LIDODERM) 5 % Place 1 patch onto the skin daily. Remove & Discard patch within 12 hours or as directed by MD  . metFORMIN (GLUCOPHAGE) 500 MG tablet TAKE 2 TABLETS BY MOUTH  TWICE DAILY  . methotrexate (RHEUMATREX) 2.5 MG tablet Take by mouth.  . mometasone (ELOCON) 0.1 % cream APPLY  CREAM TOPICALLY ONCE DAILY  . tamsulosin (FLOMAX) 0.4 MG CAPS capsule Take 1 capsule (0.4 mg total) by mouth daily.  . vitamin B-12 (CYANOCOBALAMIN) 1000 MCG tablet Take 1,000 mcg by mouth daily.    No facility-administered encounter medications on file as of 06/07/2020.    Wt Readings from Last 3 Encounters:  06/02/20 201 lb 12.8 oz (91.5 kg)  02/28/20 203 lb (92.1 kg)  01/31/20 208 lb (94.3 kg)    Current Diagnosis/Assessment:    Goals Addressed   None     Diabetes   A1c goal <7%  Recent Relevant Labs: Lab Results  Component Value Date/Time   HGBA1C 6.5 06/02/2020 02:53 PM   HGBA1C 6.4 02/28/2020 02:15 PM   HGBA1C 6.3 12/27/2015 12:00 AM   MICROALBUR 10 05/28/2019 09:12 AM   MICROALBUR 10 03/25/2016 01:26 PM    Last diabetic Eye exam:  Lab Results  Component Value Date/Time   HMDIABEYEEXA No Retinopathy 05/03/2016 01:55 PM    BMP Latest Ref Rng & Units 06/02/2020 02/28/2020 10/11/2019  Glucose 65 - 99 mg/dL 196(H) 156(H) 167(H)  BUN 8 - 27 mg/dL '12 12 18  ' Creatinine  0.76 - 1.27 mg/dL 0.82 0.73(L) 0.96  BUN/Creat Ratio 10 - '24 15 16 19  ' Sodium 134 - 144 mmol/L 140 139 142  Potassium 3.5 - 5.2 mmol/L 4.7 4.8 4.6  Chloride 96 - 106 mmol/L 101 101 104  CO2 20 - 29 mmol/L '22 27 22  ' Calcium 8.6 - 10.2 mg/dL 9.4 9.1 9.1    Last diabetic Foot exam: No results found for: HMDIABFOOTEX   Checking BG: Three times weekly  Recent FBG Readings: 120 - 140s, 141 this am  Patient has failed these meds in past: NA Patient is currently uncontrolled on the following medications: [name] Metformin 1000 mg  bid  We discussed: diet and exercise extensively and how to recognize and treat signs of hypoglycemia.   Plan  Continue current medications.   Health Maintenance/Vit D deficiency   Vit D, 25-Hydroxy  Date Value Ref Range Status  06/02/2020 28.6 (L) 30.0 - 100.0 ng/mL Final    Comment:    Vitamin D deficiency has been defined by the [place] practice guideline as a level of serum 25-OH vitamin D less than 20 ng/mL (1,2). The Endocrine Society went on to further define vitamin D insufficiency as a level between 21 and 29 ng/mL (2). 1. IOM (Institute of Medicine). 2010. Dietary reference    intakes for calcium and D. [place]: The    [employer]. 2. Holick MF, Binkley [place], Bischoff-Ferrari HA, et al.    Evaluation, treatment, and prevention of vitamin D    deficiency: an Endocrine Society clinical practice    guideline. JCEM. 2011 Jul; 96(7):1911-30.     Patient is currently controlled on the following medications:  [name] Vitamin d 2000 iu qd  . B12 1000 mcg qod   We discussed:  Patient verified he increased vit d dose to  2000 iu qd. Last B12 level  324 06/02/20. Discussed depletion while on metformin. He has restarted B12 supplement.  Plan  Continue current medications with follow up levels q 6 months to 1 year.  Hypertension   BMP Latest Ref Rng & Units 06/02/2020 02/28/2020 10/11/2019  Glucose 65 - 99 mg/dL 196(H) 156(H) 167(H)  BUN 8 - 27 mg/dL '12 12 18  ' Creatinine 0.76 - 1.27 mg/dL 0.82 0.73(L) 0.96  BUN/Creat Ratio 10 - '24 15 16 19  ' Sodium 134 - 144 mmol/L 140 139 142  Potassium 3.5 - 5.2 mmol/L 4.7 4.8 4.6  Chloride 96 - 106 mmol/L 101 101 104  CO2 20 - 29 mmol/L '22 27 22  ' Calcium 8.6 - 10.2 mg/dL 9.4 9.1 9.1   BP goal is:  <130/80  Office blood pressures are  BP Readings from Last 3 Encounters:  06/02/20 124/80  04/19/20 127/74  02/28/20 137/84   Patient checks BP at home    Patient has failed these  meds in the past:  Patient is currently controlled on the following medications:  . Carvedilol 6.65m bid  We discussed Proper technique for checking blood pressure. Provided diet and exercise counseling.   Plan  Continue current medications   BPH   No results found for: PSA   Patient has failed these meds in past: NA Patient is currently controlled on the following medications:  . flomax 0.4 mg qd . Finasteride 5 mg qd  We discussed:  Recent follow up with Dr. BErlene Quan Due for follow up ct for RCC.    Plan  Continue current medications    Hyperlipidemia  LDL goal < 70  Lipid Panel     Component Value Date/Time   CHOL 138 06/02/2020 1454   CHOL 101 10/13/2018 0901   TRIG 155 (H) 06/02/2020 1454   TRIG 91 10/13/2018 0901   HDL 44 06/02/2020 1454   LDLCALC 67 06/02/2020 1454    Hepatic Function Latest Ref Rng & Units 06/02/2020 05/28/2019 10/13/2018  Total Protein 6.0 - 8.5 g/dL 6.1 6.5 -  Albumin 3.7 - 4.7 g/dL 4.1 4.0 -  AST 0 - 40 IU/L '21 13 16  ' ALT 0 - 44 IU/L '21 8 19  ' Alk Phosphatase 44 - 121 IU/L 68 99 -  Total Bilirubin 0.0 - 1.2 mg/dL 0.5 0.5 -     The 10-year ASCVD risk score Mikey Bussing DC Jr., et al., 2013) is: 48%   Values used to calculate the score:     Age: 19 years     Sex: Male     Is Non-Hispanic African American: No     Diabetic: Yes     Tobacco smoker: No     Systolic Blood Pressure: 782 mmHg     Is BP treated: Yes     HDL Cholesterol: 44 mg/dL     Total Cholesterol: 138 mg/dL   Patient has failed these meds in past: NA Patient is currently controlled on the following medications:  . Atorvastatin 20 mg qd  We discussed:  Diet and exercise.   Plan  Continue current medications  Rheumatoid Arthritis s   Patient has failed these meds in past: NA Patient is currently controlled on the following medications:  [name] Methotrexate 15 mg q7d . Folic Acid 1 mg qd  We discussed:  Patient unable to tolerate diarrhea with leflunamide. MTX and  Folate restarted. He states he is doing well so far. Minimal pain in his hands. Stage 2 moderate, stable per Dr. Posey Pronto.  Plan  Continue current medications   Medication Management   Pt uses mailorder pharmacy for all medications Uses pill box? Yes Pt endorses 95% compliance  We discussed: Current pharmacy is preferred with insurance plan and patient is satisfied with pharmacy services  Plan Continue current medication management strategy    Follow up: 3-4  month phone visit  Junita Push. Kenton Kingfisher PharmD, [facility] [phone]

## 2020-06-14 ENCOUNTER — Other Ambulatory Visit: Payer: Self-pay

## 2020-06-14 ENCOUNTER — Other Ambulatory Visit: Payer: Medicare Other

## 2020-06-14 DIAGNOSIS — I152 Hypertension secondary to endocrine disorders: Secondary | ICD-10-CM | POA: Diagnosis not present

## 2020-06-14 DIAGNOSIS — E0821 Diabetes mellitus due to underlying condition with diabetic nephropathy: Secondary | ICD-10-CM | POA: Diagnosis not present

## 2020-06-14 DIAGNOSIS — E1159 Type 2 diabetes mellitus with other circulatory complications: Secondary | ICD-10-CM | POA: Diagnosis not present

## 2020-06-14 LAB — MICROALBUMIN, URINE WAIVED
Creatinine, Urine Waived: 50 mg/dL (ref 10–300)
Microalb, Ur Waived: 10 mg/L (ref 0–19)
Microalb/Creat Ratio: <30 mg/g (ref <30)

## 2020-06-14 NOTE — Addendum Note (Signed)
Addended by: Dorris Fetch on: 06/14/2020 11:04 AM   Modules accepted: Orders

## 2020-06-14 NOTE — Addendum Note (Signed)
Addended by: [name] T on: 06/14/2020 11:03 AM   Modules accepted: Orders

## 2020-06-14 NOTE — Progress Notes (Signed)
Contacted via MyChart   Minimal protein in urine still!!  [employer]!!

## 2020-07-04 DIAGNOSIS — M0609 Rheumatoid arthritis without rheumatoid factor, multiple sites: Secondary | ICD-10-CM | POA: Diagnosis not present

## 2020-07-04 DIAGNOSIS — Z79899 Other long term (current) drug therapy: Secondary | ICD-10-CM | POA: Diagnosis not present

## 2020-07-04 DIAGNOSIS — M8949 Other hypertrophic osteoarthropathy, multiple sites: Secondary | ICD-10-CM | POA: Diagnosis not present

## 2020-07-12 ENCOUNTER — Ambulatory Visit (INDEPENDENT_AMBULATORY_CARE_PROVIDER_SITE_OTHER): Payer: Medicare Other | Admitting: General Practice

## 2020-07-12 ENCOUNTER — Telehealth: Payer: Self-pay | Admitting: General Practice

## 2020-07-12 DIAGNOSIS — E1169 Type 2 diabetes mellitus with other specified complication: Secondary | ICD-10-CM | POA: Diagnosis not present

## 2020-07-12 DIAGNOSIS — I152 Hypertension secondary to endocrine disorders: Secondary | ICD-10-CM

## 2020-07-12 DIAGNOSIS — E785 Hyperlipidemia, unspecified: Secondary | ICD-10-CM

## 2020-07-12 DIAGNOSIS — E1159 Type 2 diabetes mellitus with other circulatory complications: Secondary | ICD-10-CM

## 2020-07-12 DIAGNOSIS — M0609 Rheumatoid arthritis without rheumatoid factor, multiple sites: Secondary | ICD-10-CM

## 2020-07-12 DIAGNOSIS — E0821 Diabetes mellitus due to underlying condition with diabetic nephropathy: Secondary | ICD-10-CM

## 2020-07-12 NOTE — Patient Instructions (Signed)
Visit Information  PATIENT GOALS: Patient Care Plan: RNCM: Diabetes Type 2 (Adult)    Problem Identified: RNCM: Glycemic Management (Diabetes, Type 2)   Priority: Medium    Goal: RNCM: Glycemic Management Optimized   Note:   Objective:   Lab Results   Component  Value  Date     HGBA1C  6.5  06/02/2020      Lab Results   Component  Value  Date     CHOL  138  06/02/2020     HDL  44  06/02/2020     LDLCALC  67  06/02/2020     TRIG  155 (H)  06/02/2020     CHOLHDL  2.5  03/12/2018      No results found for: EGFR Current Barriers:   Knowledge Deficits related to basic Diabetes pathophysiology and self care/management  Knowledge Deficits related to medications used for management of diabetes  Unable to independently manage DM  Does not contact provider office for questions/concerns Case Manager Clinical Goal(s):   Collaboration with Venita Lick, NP regarding development and update of comprehensive plan of care as evidenced by provider attestation and co-signature  Inter-disciplinary care team collaboration (see longitudinal plan of care)  Over the next 120 days, patient will demonstrate improved adherence to prescribed treatment plan for diabetes self care/management as evidenced by:   daily monitoring and recording of CBG   adherence to ADA/ carb modified diet  exercise 3/4 days/week  adherence to prescribed medication regimen Interventions:   Provided education to patient about basic DM disease process.  07-12-2020: Review of heart healthy/ADA diet, medications compliance and overall DM health. The patient states he feels great.   Reviewed medications with patient and discussed importance of medication adherence. 07-12-2020: The patient ask about getting new script for his metformin 1063m tablets instead of 2- 500 mg tablets.  He wanted to know why his wife had the 10035mtablet and he had the 500 mg x 2.  Education and support  given. Education on not getting the 500 mg tablets mixed up with the 1000 mg tablets. The patient verbalized understanding. Will send in basket message to the pcp about changing script at new refill for 1000 mg tablets to be dispensed.   Discussed plans with patient for ongoing care management follow up and provided patient with direct contact information for care management team  Provided patient with written educational materials related to hypo and hyperglycemia and importance of correct treatment.  07-12-2020: The patient states that he knows what to look for with changes in his blood sugars. Denies any lows or highs at this time.   Reviewed scheduled/upcoming provider appointments including: 09-01-2020 at 220  pm  Advised patient, providing education and rationale, to check cbg BID and record, calling pcp for findings outside established parameters.  07-12-2020: The patient states that his blood sugars have been good except when he ate the dump cake earlier this week. States he keeps a consistent check on his blood sugars.  He is mindful of highs and lows.   Review of patient status, including review of consultants reports, relevant laboratory and other test results, and medications completed.  - barriers to adherence to treatment plan identified  - blood glucose readings reviewed  - resources required to improve adherence to care identified  - self-awareness of signs/symptoms of hypo or hyperglycemia encouraged  - use of blood glucose monitoring log promoted Patient Goals/Self-Care Activities  Over the next 120 days, patient will:  -  UNABLE to independently manage DM Checks blood sugars as prescribed and utilize hyper and hypoglycemia protocol as needed Adheres to prescribed ADA/carb modified Follow Up Plan: Telephone follow up appointment with care management team member scheduled for: 09-26-2020 at 1:45 pm   Task: RNCM: Alleviate Barriers to Glycemic Management   Note:   Care  Management Activities:    - barriers to adherence to treatment plan identified - blood glucose readings reviewed - resources required to improve adherence to care identified - self-awareness of signs/symptoms of hypo or hyperglycemia encouraged - use of blood glucose monitoring log promoted       Patient Care Plan: RNCM: Hypertension (Adult)    Problem Identified: RNCM: Hypertension (Hypertension)   Priority: Medium    Long-Range Goal: RNCM: Hypertension Monitored   Priority: Medium  Note:   Objective:   Last practice recorded BP readings:   BP Readings from Last 3 Encounters:   06/02/20  124/80   04/19/20  127/74   02/28/20  137/84      Most recent eGFR/CrCl: No results found for: EGFR  No components found for: CRCL Current Barriers:   Knowledge Deficits related to basic understanding of hypertension pathophysiology and self care management  Knowledge Deficits related to understanding of medications prescribed for management of hypertension  Unable to independently HTN  Unable to self administer medications as prescribed  Does not contact provider office for questions/concerns Case Manager Clinical Goal(s):   Over the next 120 days, patient will verbalize understanding of plan for hypertension management  Over the next 120 days, patient will attend all scheduled medical appointments: 06-02-2020 at 3 pm  Over the next 120 days, patient will demonstrate improved adherence to prescribed treatment plan for hypertension as evidenced by taking all medications as prescribed, monitoring and recording blood pressure as directed, adhering to low sodium/DASH diet  Over the next 120 days, patient will demonstrate improved health management independence as evidenced by checking blood pressure as directed and notifying PCP if SBP>160 or DBP > 90, taking all medications as prescribe, and adhering to a low sodium diet as discussed. Interventions:   Collaboration with  Venita Lick, NP regarding development and update of comprehensive plan of care as evidenced by provider attestation and co-signature  Inter-disciplinary care team collaboration (see longitudinal plan of care)  Evaluation of current treatment plan related to hypertension self management and patient's adherence to plan as established by provider.  Provided education to patient re: stroke prevention, s/s of heart attack and stroke, DASH diet, complications of uncontrolled blood pressure  Reviewed medications with patient and discussed importance of compliance  Discussed plans with patient for ongoing care management follow up and provided patient with direct contact information for care management team  Advised patient, providing education and rationale, to monitor blood pressure daily and record, calling PCP for findings outside established parameters. 07-12-2020: The patient states that he has been having headaches and when he has headaches sometimes he has blood pressures that are elevated but not bad. The patient states he has always had headaches but some days are worse than others. Reminded patient to discuss this with the pcp on next visit for recommendations.   Reviewed scheduled/upcoming provider appointments including: 09-01-2020 at 220 pm Patient Goals/Self-Care Activities  Over the next 120 days, patient will:  - UNABLE to independently management HTN Calls provider office for new concerns, questions, or BP outside discussed parameters Checks BP and records as discussed Follows a low sodium diet/DASH diet - blood pressure  trends reviewed - depression screen reviewed - home or ambulatory blood pressure monitoring encouraged Follow Up Plan: Telephone follow up appointment with care management team member scheduled for: 09-26-2020 at 1:45pm   Task: RNCM: Identify and Monitor Blood Pressure Elevation   Note:   Care Management Activities:    - blood pressure trends reviewed -  depression screen reviewed - home or ambulatory blood pressure monitoring encouraged       Patient Care Plan: RNCM: Management of HLD    Problem Identified: RNCM: Management of HLD   Priority: Medium    Goal: RNCM:HLD  Self-Management Plan Developed   Priority: Medium  Note:   Current Barriers:   Poorly controlled hyperlipidemia, complicated by DM/HTN  Current antihyperlipidemic regimen: Lipitor 20 mg daily   Most recent lipid panel:   Lab Results   Component  Value  Date     CHOL  138  06/02/2020     HDL  44  06/02/2020     [place]  67  06/02/2020     TRIG  155 (H)  06/02/2020     CHOLHDL  2.5  03/12/2018      ASCVD risk enhancing conditions: age >31, DM, HTN  Unable to independently manage HLD  Does not contact provider office for questions/concerns  RN Care Manager Clinical Goal(s):   Over the next 120 days, patient will work with Consulting civil engineer, providers, and care team towards execution of optimized self-health management plan  Over the next 120 days, patient will verbalize understanding of plan for HLD  Over the next 120 days, patient will work with [facility] and pcp to address needs related to HLD and effective management   Over the next 120 days, patient will attend all scheduled medical appointments: 06-02-2020 at 3 pm  Interventions:  Collaboration with Venita Lick, NP regarding development and update of comprehensive plan of care as evidenced by provider attestation and co-signature  Inter-disciplinary care team collaboration (see longitudinal plan of care)  Medication review performed; medication list updated in electronic medical record.   Inter-disciplinary care team collaboration (see longitudinal plan of care)  Referred to pharmacy team for assistance with HLD medication management  Evaluation of current treatment plan related to HLD and patient's adherence to plan as established by provider. 07-12-2020: The patient  denies any new concerns with his HLD. States he is monitoring his dietary intake. Knows to call for changes in conditions or questions.   Advised patient to call the office for changes in condition or questions concerning chronic conditions   Provided education to patient re: heart healthy/ADA diet and hidden sodium in food content   Discussed plans with patient for ongoing care management follow up and provided patient with direct contact information for care management team  Reviewed scheduled/upcoming provider appointments including: 09-01-2020 at 220pm   Patient Goals/Self-Care Activities:  Over the next 120 days, patient will:   - call for medicine refill 2 or 3 days before it runs out - call if I am sick and can't take my medicine - keep a list of all the medicines I take; vitamins and herbals too - learn to read medicine labels - drink 6 to 8 glasses of water each day - eat 3 to 5 servings of fruits and vegetables each day - eat 5 or 6 small meals each day - limit fast food meals to no more than 1 per week - manage portion size - prepare main meal at home 3 to 5 days  each week - read food labels for fat, fiber, carbohydrates and portion size - be open to making changes - I can manage, know and watch for signs of a heart attack - if I have chest pain, call for help - learn about small changes that will make a big difference - learn my personal risk factors  - barriers to meeting goals identified - choices provided - collaboration with team encouraged - decision-making supported - health risks reviewed - questions answered - resources needed to meet goals identified - self-reflection promoted - self-reliance encouraged  Follow Up Plan: Telephone follow up appointment with care management team member scheduled for:09-26-2020 at 1:45 pm     Task: RNCM: Mutually Develop and Foster Achievement of Patient Goals   Note:   Care Management Activities:    - barriers to  meeting goals identified - choices provided - collaboration with team encouraged - decision-making supported - health risks reviewed - questions answered - resources needed to meet goals identified - self-reflection promoted - self-reliance encouraged       Patient Care Plan: RNCM: RA Management    Problem Identified: RNCM: Pain (RA)   Priority: Medium    Long-Range Goal: RNCM: Manage Pain- RA   Priority: Medium  Note:   Current Barriers:   Knowledge Deficits related to managing acute/chronic pain  Non-adherence to scheduled provider appointments  Non-adherence to prescribed medication regimen  Difficulty obtaining medications  Chronic Disease Management support and education needs related to chronic pain  Lacks social connections  Does not contact provider office for questions/concerns Nurse Case Manager Clinical Goal(s):   patient will verbalize understanding of plan for managing pain  patient will attend all scheduled medical appointments: 09-01-2020 at 220 pm  patient will demonstrate use of different relaxation  skills and/or diversional activities to assist with pain reduction (distraction, imagery, relaxation, massage, acupressure, TENS, heat, and cold application  patient will report pain at a level less than 3 to 4 on a 10-10 rating scale  patient will use pharmacological and nonpharmacological pain relief strategies  patient will verbalize acceptable level of pain relief and ability to engage in desired activities  patient will engage in desired activities without an increase in pain level Interventions:   Collaboration with [name] T, NP regarding development and update of comprehensive plan of care as evidenced by provider attestation and co-signature  Inter-disciplinary care team collaboration (see longitudinal plan of care)  - deep breathing, relaxation and mindfulness use promoted  - medication-induced side effects managed  - misuse of  pain medication assessed  - motivation and barriers to change assessed and addressed  - mutually acceptable comfort goal set  - pain assessed  - pain treatment goals reviewed  - premedication prior to activity encouraged  Evaluation of current treatment plan related to RA and patient's adherence to plan as established by provider.  Advised patient to call the office for new questions or concerns   Provided education to patient re: alternative pain control methods   Reviewed medications with patient and discussed compliance. The patient was taken off of Leflunomide and started on Methotrexate. The patient states the Leflunomide was causing diarrhea. He has not had any issues with the Methotrexate. Will continue to monitor.   Discussed plans with patient for ongoing care management follow up and provided patient with direct contact information for care management team  Allow patient to maintain a diary of pain ratings, timing, precipitating events, medications, treatments, and what works best to relieve  pain,   Refer to support groups and self-help groups  Educate patient about the use of pharmacological interventions for pain management- antianxiety, antidepressants, NSAIDS, opioid analgesics,   Explain the importance of lifestyle modifications to effective pain management  Patient Goals/Self Care Activities:   - healthy lifestyle promoted  - medication side effects managed  - multimodal pain management plan developed  - pain assessed  - response to pain management plan monitored  - response to pharmacologic therapy monitored  - support and encouragement provided  - weight management encouraged  Self-administers medications as prescribed  Attends all scheduled provider appointments  Calls pharmacy for medication refills  Calls provider office for new concerns or questions Follow Up Plan: Telephone follow up appointment with care management team member scheduled  for: 09-26-2020 at 1:45 pm     Task: RNCM: Facilitate Multimodal Pain Management Plan   Note:   Care Management Activities:    - healthy lifestyle promoted - medication side effects managed - multimodal pain management plan developed - pain assessed - response to pain management plan monitored - response to pharmacologic therapy monitored - support and encouragement provided - weight management encouraged         Patient verbalizes understanding of instructions provided today and agrees to view in [place].   Telephone follow up appointment with care management team member scheduled for:09-26-2020 at 1:45 pm  Noreene Larsson RN, MSN, [facility] Mobile: [phone]

## 2020-07-12 NOTE — Chronic Care Management (AMB) (Signed)
Chronic Care Management   CCM RN Visit Note  07/12/2020 Name: Jason Contreras MRN: [id number] DOB: 11-16-1940  Subjective: Jason Contreras is a 80 y.o. year old male who is a primary care patient of [name], [name], NP. The care management team was consulted for assistance with disease management and care coordination needs.    Engaged with patient by telephone for follow up visit in response to provider referral for case management and/or care coordination services.   Consent to Services:  The patient was given information about Chronic Care Management services, agreed to services, and gave verbal consent prior to initiation of services.  Please see initial visit note for detailed documentation.   Patient agreed to services and verbal consent obtained.   Assessment: Review of patient past medical history, allergies, medications, health status, including review of consultants reports, laboratory and other test data, was performed as part of comprehensive evaluation and provision of chronic care management services.   SDOH (Social Determinants of Health) assessments and interventions performed:    CCM Care Plan  Allergies  Allergen Reactions   Morphine Nausea And Vomiting   Morphine And Related Nausea And Vomiting    Outpatient Encounter Medications as of 07/12/2020  Medication Sig   atorvastatin (LIPITOR) 20 MG tablet TAKE 1 TABLET BY MOUTH  DAILY AT 6 PM.   carvedilol (COREG) 6.25 MG tablet Take 1 tablet (6.25 mg total) by mouth 2 (two) times daily.   cholecalciferol (VITAMIN D3) 25 MCG (1000 UNIT) tablet Take 2,000 Units by mouth daily.    DULoxetine (CYMBALTA) 60 MG capsule Take 1 capsule (60 mg total) by mouth daily.   finasteride (PROSCAR) 5 MG tablet Take 1 tablet (5 mg total) by mouth daily.   folic acid (FOLVITE) 1 MG tablet Take by mouth.   gabapentin (NEURONTIN) 600 MG tablet TAKE 1 TABLET BY MOUTH 3  TIMES DAILY   lidocaine (LIDODERM) 5 % Place 1 patch onto the  skin daily. Remove & Discard patch within 12 hours or as directed by MD   metFORMIN (GLUCOPHAGE) 500 MG tablet TAKE 2 TABLETS BY MOUTH  TWICE DAILY   methotrexate (RHEUMATREX) 2.5 MG tablet Take by mouth.   mometasone (ELOCON) 0.1 % cream APPLY  CREAM TOPICALLY ONCE DAILY   tamsulosin (FLOMAX) 0.4 MG CAPS capsule Take 1 capsule (0.4 mg total) by mouth daily.   vitamin B-12 (CYANOCOBALAMIN) 1000 MCG tablet Take 1,000 mcg by mouth daily.    No facility-administered encounter medications on file as of 07/12/2020.    Patient Active Problem List   Diagnosis Date Noted   Aortic atherosclerosis ([place]) 05/26/2020   Rheumatoid arthritis of multiple sites with negative rheumatoid factor ([place]) 12/08/2019   B12 deficiency 07/12/2019   Vitamin D deficiency 06/30/2019   Personal history of renal cancer 10/17/2017   Advanced care planning/counseling discussion 01/13/2017   Senile purpura ([place]) 09/26/2016   Elevated PSA 01/07/2015   BPH with obstruction/lower urinary tract symptoms 01/07/2015   DM due to underlying condition with diabetic nephropathy ([place])    Hypertension associated with diabetes ([place])    Hyperlipidemia associated with type 2 diabetes mellitus ([place])     Conditions to be addressed/monitored:HTN, HLD, DMII and RA  Care Plan : RNCM: Diabetes Type 2 (Adult)  Updates made by Vanita Ingles since 07/12/2020 12:00 AM    Problem: RNCM: Glycemic Management (Diabetes, Type 2)   Priority: Medium    Goal: RNCM: Glycemic Management Optimized   Note:   Objective:  Lab Results   Component  Value  Date     HGBA1C  6.5  06/02/2020      Lab Results   Component  Value  Date     CHOL  138  06/02/2020     HDL  44  06/02/2020     LDLCALC  67  06/02/2020     TRIG  155 (H)  06/02/2020     CHOLHDL  2.5  03/12/2018      No results found for: EGFR Current Barriers:   Knowledge Deficits related to basic Diabetes pathophysiology and self  care/management  Knowledge Deficits related to medications used for management of diabetes  Unable to independently manage DM  Does not contact provider office for questions/concerns Case Manager Clinical Goal(s):   Collaboration with Venita Lick, NP regarding development and update of comprehensive plan of care as evidenced by provider attestation and co-signature  Inter-disciplinary care team collaboration (see longitudinal plan of care)  Over the next 120 days, patient will demonstrate improved adherence to prescribed treatment plan for diabetes self care/management as evidenced by:   daily monitoring and recording of CBG   adherence to ADA/ carb modified diet  exercise 3/4 days/week  adherence to prescribed medication regimen Interventions:   Provided education to patient about basic DM disease process.  07-12-2020: Review of heart healthy/ADA diet, medications compliance and overall DM health. The patient states he feels great.   Reviewed medications with patient and discussed importance of medication adherence. 07-12-2020: The patient ask about getting new script for his metformin 1062m tablets instead of 2- 500 mg tablets.  He wanted to know why his wife had the 10055mtablet and he had the 500 mg x 2.  Education and support given. Education on not getting the 500 mg tablets mixed up with the 1000 mg tablets. The patient verbalized understanding. Will send in basket message to the pcp about changing script at new refill for 1000 mg tablets to be dispensed.   Discussed plans with patient for ongoing care management follow up and provided patient with direct contact information for care management team  Provided patient with written educational materials related to hypo and hyperglycemia and importance of correct treatment.  07-12-2020: The patient states that he knows what to look for with changes in his blood sugars. Denies any lows or highs at this time.   Reviewed  scheduled/upcoming provider appointments including: 09-01-2020 at 220  pm  Advised patient, providing education and rationale, to check cbg BID and record, calling pcp for findings outside established parameters.  07-12-2020: The patient states that his blood sugars have been good except when he ate the dump cake earlier this week. States he keeps a consistent check on his blood sugars.  He is mindful of highs and lows.   Review of patient status, including review of consultants reports, relevant laboratory and other test results, and medications completed.  - barriers to adherence to treatment plan identified  - blood glucose readings reviewed  - resources required to improve adherence to care identified  - self-awareness of signs/symptoms of hypo or hyperglycemia encouraged  - use of blood glucose monitoring log promoted Patient Goals/Self-Care Activities  Over the next 120 days, patient will:  - UNABLE to independently manage DM Checks blood sugars as prescribed and utilize hyper and hypoglycemia protocol as needed Adheres to prescribed ADA/carb modified Follow Up Plan: Telephone follow up appointment with care management team member scheduled for: 09-26-2020 at 1:45 pm  Care Plan : RNCM: Hypertension (Adult)  Updates made by Vanita Ingles since 07/12/2020 12:00 AM    Problem: RNCM: Hypertension (Hypertension)   Priority: Medium    Long-Range Goal: RNCM: Hypertension Monitored   Priority: Medium  Note:   Objective:   Last practice recorded BP readings:   BP Readings from Last 3 Encounters:   06/02/20  124/80   04/19/20  127/74   02/28/20  137/84      Most recent eGFR/CrCl: No results found for: EGFR  No components found for: CRCL Current Barriers:   Knowledge Deficits related to basic understanding of hypertension pathophysiology and self care management  Knowledge Deficits related to understanding of medications prescribed for management of hypertension  Unable  to independently HTN  Unable to self administer medications as prescribed  Does not contact provider office for questions/concerns Case Manager Clinical Goal(s):   Over the next 120 days, patient will verbalize understanding of plan for hypertension management  Over the next 120 days, patient will attend all scheduled medical appointments: 06-02-2020 at 3 pm  Over the next 120 days, patient will demonstrate improved adherence to prescribed treatment plan for hypertension as evidenced by taking all medications as prescribed, monitoring and recording blood pressure as directed, adhering to low sodium/DASH diet  Over the next 120 days, patient will demonstrate improved health management independence as evidenced by checking blood pressure as directed and notifying PCP if SBP>160 or DBP > 90, taking all medications as prescribe, and adhering to a low sodium diet as discussed. Interventions:   Collaboration with Venita Lick, NP regarding development and update of comprehensive plan of care as evidenced by provider attestation and co-signature  Inter-disciplinary care team collaboration (see longitudinal plan of care)  Evaluation of current treatment plan related to hypertension self management and patient's adherence to plan as established by provider.  Provided education to patient re: stroke prevention, s/s of heart attack and stroke, DASH diet, complications of uncontrolled blood pressure  Reviewed medications with patient and discussed importance of compliance  Discussed plans with patient for ongoing care management follow up and provided patient with direct contact information for care management team  Advised patient, providing education and rationale, to monitor blood pressure daily and record, calling PCP for findings outside established parameters. 07-12-2020: The patient states that he has been having headaches and when he has headaches sometimes he has blood pressures that are  elevated but not bad. The patient states he has always had headaches but some days are worse than others. Reminded patient to discuss this with the pcp on next visit for recommendations.   Reviewed scheduled/upcoming provider appointments including: 09-01-2020 at 220 pm Patient Goals/Self-Care Activities  Over the next 120 days, patient will:  - UNABLE to independently management HTN Calls provider office for new concerns, questions, or BP outside discussed parameters Checks BP and records as discussed Follows a low sodium diet/DASH diet - blood pressure trends reviewed - depression screen reviewed - home or ambulatory blood pressure monitoring encouraged Follow Up Plan: Telephone follow up appointment with care management team member scheduled for: 09-26-2020 at 1:45pm   Care Plan : RNCM: Management of HLD  Updates made by Vanita Ingles since 07/12/2020 12:00 AM    Problem: RNCM: Management of HLD   Priority: Medium    Goal: RNCM:HLD  Self-Management Plan Developed   Priority: Medium  Note:   Current Barriers:   Poorly controlled hyperlipidemia, complicated by DM/HTN  Current antihyperlipidemic regimen: Lipitor 20  mg daily   Most recent lipid panel:   Lab Results   Component  Value  Date     CHOL  138  06/02/2020     HDL  44  06/02/2020     LDLCALC  67  06/02/2020     TRIG  155 (H)  06/02/2020     CHOLHDL  2.5  03/12/2018      ASCVD risk enhancing conditions: age >47, DM, HTN  Unable to independently manage HLD  Does not contact provider office for questions/concerns  RN Care Manager Clinical Goal(s):   Over the next 120 days, patient will work with Consulting civil engineer, providers, and care team towards execution of optimized self-health management plan  Over the next 120 days, patient will verbalize understanding of plan for HLD  Over the next 120 days, patient will work with [facility] and pcp to address needs related to HLD and effective management    Over the next 120 days, patient will attend all scheduled medical appointments: 06-02-2020 at 3 pm  Interventions:  Collaboration with Venita Lick, NP regarding development and update of comprehensive plan of care as evidenced by provider attestation and co-signature  Inter-disciplinary care team collaboration (see longitudinal plan of care)  Medication review performed; medication list updated in electronic medical record.   Inter-disciplinary care team collaboration (see longitudinal plan of care)  Referred to pharmacy team for assistance with HLD medication management  Evaluation of current treatment plan related to HLD and patient's adherence to plan as established by provider. 07-12-2020: The patient denies any new concerns with his HLD. States he is monitoring his dietary intake. Knows to call for changes in conditions or questions.   Advised patient to call the office for changes in condition or questions concerning chronic conditions   Provided education to patient re: heart healthy/ADA diet and hidden sodium in food content   Discussed plans with patient for ongoing care management follow up and provided patient with direct contact information for care management team  Reviewed scheduled/upcoming provider appointments including: 09-01-2020 at 220pm   Patient Goals/Self-Care Activities:  Over the next 120 days, patient will:   - call for medicine refill 2 or 3 days before it runs out - call if I am sick and can't take my medicine - keep a list of all the medicines I take; vitamins and herbals too - learn to read medicine labels - drink 6 to 8 glasses of water each day - eat 3 to 5 servings of fruits and vegetables each day - eat 5 or 6 small meals each day - limit fast food meals to no more than 1 per week - manage portion size - prepare main meal at home 3 to 5 days each week - read food labels for fat, fiber, carbohydrates and portion size - be open to making  changes - I can manage, know and watch for signs of a heart attack - if I have chest pain, call for help - learn about small changes that will make a big difference - learn my personal risk factors  - barriers to meeting goals identified - choices provided - collaboration with team encouraged - decision-making supported - health risks reviewed - questions answered - resources needed to meet goals identified - self-reflection promoted - self-reliance encouraged  Follow Up Plan: Telephone follow up appointment with care management team member scheduled for:09-26-2020 at 1:45 pm     Care Plan : RNCM: RA Management  Updates made by  Vanita Ingles since 07/12/2020 12:00 AM    Problem: RNCM: Pain (RA)   Priority: Medium    Long-Range Goal: RNCM: Manage Pain- RA   Priority: Medium  Note:   Current Barriers:   Knowledge Deficits related to managing acute/chronic pain  Non-adherence to scheduled provider appointments  Non-adherence to prescribed medication regimen  Difficulty obtaining medications  Chronic Disease Management support and education needs related to chronic pain  Lacks social connections  Does not contact provider office for questions/concerns Nurse Case Manager Clinical Goal(s):   patient will verbalize understanding of plan for managing pain  patient will attend all scheduled medical appointments: 09-01-2020 at 220 pm  patient will demonstrate use of different relaxation  skills and/or diversional activities to assist with pain reduction (distraction, imagery, relaxation, massage, acupressure, TENS, heat, and cold application  patient will report pain at a level less than 3 to 4 on a 10-10 rating scale  patient will use pharmacological and nonpharmacological pain relief strategies  patient will verbalize acceptable level of pain relief and ability to engage in desired activities  patient will engage in desired activities without an increase in pain  level Interventions:   Collaboration with [name] T, NP regarding development and update of comprehensive plan of care as evidenced by provider attestation and co-signature  Inter-disciplinary care team collaboration (see longitudinal plan of care)  - deep breathing, relaxation and mindfulness use promoted  - medication-induced side effects managed  - misuse of pain medication assessed  - motivation and barriers to change assessed and addressed  - mutually acceptable comfort goal set  - pain assessed  - pain treatment goals reviewed  - premedication prior to activity encouraged  Evaluation of current treatment plan related to RA and patient's adherence to plan as established by provider.  Advised patient to call the office for new questions or concerns   Provided education to patient re: alternative pain control methods   Reviewed medications with patient and discussed compliance. The patient was taken off of Leflunomide and started on Methotrexate. The patient states the Leflunomide was causing diarrhea. He has not had any issues with the Methotrexate. Will continue to monitor.   Discussed plans with patient for ongoing care management follow up and provided patient with direct contact information for care management team  Allow patient to maintain a diary of pain ratings, timing, precipitating events, medications, treatments, and what works best to relieve pain,   Refer to support groups and self-help groups  Educate patient about the use of pharmacological interventions for pain management- antianxiety, antidepressants, NSAIDS, opioid analgesics,   Explain the importance of lifestyle modifications to effective pain management  Patient Goals/Self Care Activities:   - healthy lifestyle promoted  - medication side effects managed  - multimodal pain management plan developed  - pain assessed  - response to pain management plan monitored  - response to  pharmacologic therapy monitored  - support and encouragement provided  - weight management encouraged  Self-administers medications as prescribed  Attends all scheduled provider appointments  Calls pharmacy for medication refills  Calls provider office for new concerns or questions Follow Up Plan: Telephone follow up appointment with care management team member scheduled for: 09-26-2020 at 1:45 pm     Task: RNCM: Facilitate Multimodal Pain Management Plan   Note:   Care Management Activities:    - healthy lifestyle promoted - medication side effects managed - multimodal pain management plan developed - pain assessed - response to pain management  plan monitored - response to pharmacologic therapy monitored - support and encouragement provided - weight management encouraged         Plan:Telephone follow up appointment with care management team member scheduled for:  09-01-2020 at 63 pm  [place], MSN, [facility] Mobile: [phone]

## 2020-08-07 ENCOUNTER — Telehealth: Payer: Self-pay

## 2020-08-07 NOTE — Chronic Care Management (AMB) (Signed)
Chronic Care Management Pharmacy Assistant   Name: Jason Contreras  MRN: [id number] DOB: 05-07-1940   Reason for Encounter: Disease State- Diabetes Mellitus  Disease State Call   Recent office visits:  None Noted   Recent consult visits:  07/04/20- Mayur Posey Pronto, MD (Rheumatology)- Medication problem, started methotrexate 2.5mg  six tabs every 7 days   Hospital visits:  None in previous 6 months  Medications: Outpatient Encounter Medications as of 08/07/2020  Medication Sig  . atorvastatin (LIPITOR) 20 MG tablet TAKE 1 TABLET BY MOUTH  DAILY AT 6 PM.  . carvedilol (COREG) 6.25 MG tablet Take 1 tablet (6.25 mg total) by mouth 2 (two) times daily.  . cholecalciferol (VITAMIN D3) 25 MCG (1000 UNIT) tablet Take 2,000 Units by mouth daily.   [name] COVID-19 mRNA vaccine, Pfizer, 30 MCG/0.3ML injection USE AS DIRECTED  . DULoxetine (CYMBALTA) 60 MG capsule Take 1 capsule (60 mg total) by mouth daily.  . finasteride (PROSCAR) 5 MG tablet Take 1 tablet (5 mg total) by mouth daily.  . folic acid (FOLVITE) 1 MG tablet Take by mouth.  . gabapentin (NEURONTIN) 600 MG tablet TAKE 1 TABLET BY MOUTH 3  TIMES DAILY  . lidocaine (LIDODERM) 5 % Place 1 patch onto the skin daily. Remove & Discard patch within 12 hours or as directed by MD  . metFORMIN (GLUCOPHAGE) 500 MG tablet TAKE 2 TABLETS BY MOUTH  TWICE DAILY  . methotrexate (RHEUMATREX) 2.5 MG tablet Take by mouth.  . mometasone (ELOCON) 0.1 % cream APPLY  CREAM TOPICALLY ONCE DAILY  . tamsulosin (FLOMAX) 0.4 MG CAPS capsule Take 1 capsule (0.4 mg total) by mouth daily.  . vitamin B-12 (CYANOCOBALAMIN) 1000 MCG tablet Take 1,000 mcg by mouth daily.    No facility-administered encounter medications on file as of 08/07/2020.   Recent Relevant Labs: Lab Results  Component Value Date/Time   HGBA1C 6.5 06/02/2020 02:53 PM   HGBA1C 6.4 02/28/2020 02:15 PM   HGBA1C 6.3 12/27/2015 12:00 AM   MICROALBUR 10 06/14/2020 11:04 AM   MICROALBUR 10 05/28/2019  09:12 AM    Kidney Function Lab Results  Component Value Date/Time   CREATININE 0.82 06/02/2020 02:54 PM   CREATININE 0.73 (L) 02/28/2020 02:19 PM   GFRNONAA 85 06/02/2020 02:54 PM   GFRAA 99 06/02/2020 02:54 PM    Current antihyperglycemic regimen:  Metformin 500mg - Take two tabs twice daily    What recent interventions/DTPs have been made to improve glycemic control: No recent interventions noted    Have there been any recent hospitalizations or ED visits since last visit with CPP? No   Patient denies hypoglycemic symptoms, including Pale, Sweaty, Shaky, Hungry, Nervous/irritable and Vision changes   Patient denies hyperglycemic symptoms, including blurry vision, excessive thirst, fatigue, polyuria and weakness   How often are you checking your blood sugar? 3 times weekly before breakfast   What are your blood sugars ranging?  o Fasting:07/31/2020 130,112,150,147,122,118 o Before meals:  o After meals: o Bedtime:   During the week, how often does your blood glucose drop below 70? Never   Are you checking your feet daily/regularly?  Patient stated he doesn't  inspect his feet, but does notice if he steps on something. I advised him that he should be checking his feet daily and to make provider aware of any abnormal cuts or sore.  Adherence Review: Is the patient currently on a STATIN medication? Yes  Atrovastatin 20mg - take one tab every evening   Is the patient  currently on ACE/ARB medication? No   Does the patient have >5 day gap between last estimated fill dates? No   Star Rating Drugs: Atrovastatin 20mg - 90DS  last filled 05/04/20 take one tab every evening  Metformin 500mg  90DS last filled 04/19/20 take two tabs twice daily  Patient stated prescription is received through [facility], [place]

## 2020-08-08 ENCOUNTER — Other Ambulatory Visit: Payer: Self-pay

## 2020-08-08 DIAGNOSIS — R972 Elevated prostate specific antigen [PSA]: Secondary | ICD-10-CM

## 2020-08-18 ENCOUNTER — Other Ambulatory Visit: Payer: Self-pay

## 2020-08-21 ENCOUNTER — Other Ambulatory Visit: Payer: Medicare Other

## 2020-08-23 ENCOUNTER — Ambulatory Visit: Payer: Self-pay | Admitting: Urology

## 2020-09-01 ENCOUNTER — Encounter: Payer: Self-pay | Admitting: Nurse Practitioner

## 2020-09-01 ENCOUNTER — Other Ambulatory Visit: Payer: Self-pay

## 2020-09-01 ENCOUNTER — Ambulatory Visit (INDEPENDENT_AMBULATORY_CARE_PROVIDER_SITE_OTHER): Payer: Medicare Other | Admitting: Nurse Practitioner

## 2020-09-01 VITALS — BP 132/80 | HR 78 | Temp 98.0°F | Wt 205.2 lb

## 2020-09-01 DIAGNOSIS — E785 Hyperlipidemia, unspecified: Secondary | ICD-10-CM | POA: Diagnosis not present

## 2020-09-01 DIAGNOSIS — Z85528 Personal history of other malignant neoplasm of kidney: Secondary | ICD-10-CM

## 2020-09-01 DIAGNOSIS — I152 Hypertension secondary to endocrine disorders: Secondary | ICD-10-CM

## 2020-09-01 DIAGNOSIS — E538 Deficiency of other specified B group vitamins: Secondary | ICD-10-CM

## 2020-09-01 DIAGNOSIS — D692 Other nonthrombocytopenic purpura: Secondary | ICD-10-CM | POA: Diagnosis not present

## 2020-09-01 DIAGNOSIS — I700 Atherosclerosis of aorta: Secondary | ICD-10-CM

## 2020-09-01 DIAGNOSIS — E0821 Diabetes mellitus due to underlying condition with diabetic nephropathy: Secondary | ICD-10-CM

## 2020-09-01 DIAGNOSIS — R972 Elevated prostate specific antigen [PSA]: Secondary | ICD-10-CM

## 2020-09-01 DIAGNOSIS — M0609 Rheumatoid arthritis without rheumatoid factor, multiple sites: Secondary | ICD-10-CM

## 2020-09-01 DIAGNOSIS — E1169 Type 2 diabetes mellitus with other specified complication: Secondary | ICD-10-CM

## 2020-09-01 DIAGNOSIS — E559 Vitamin D deficiency, unspecified: Secondary | ICD-10-CM

## 2020-09-01 DIAGNOSIS — E1159 Type 2 diabetes mellitus with other circulatory complications: Secondary | ICD-10-CM | POA: Diagnosis not present

## 2020-09-01 LAB — BAYER DCA HB A1C WAIVED: HB A1C (BAYER DCA - WAIVED): 7.3 % — ABNORMAL HIGH (ref <7.0)

## 2020-09-01 NOTE — Assessment & Plan Note (Signed)
Chronic, ongoing.  Followed by rheumatology with much improved functional status since initially meeting this patient one year ago.  Continue this collaboration and current regimen as prescribed by them.  Recent note reviewed.

## 2020-09-01 NOTE — Assessment & Plan Note (Signed)
Noted on CXR 07/20/19.  Continue statin and ASA daily for prevention.  Monitor closely. °

## 2020-09-01 NOTE — Assessment & Plan Note (Signed)
Chronic, ongoing.  Continue current medication regimen and adjust as needed.  Lipid panel up to date. 

## 2020-09-01 NOTE — Assessment & Plan Note (Signed)
Chronic, stable.  Recommend continue to perform gentle skin care and monitor for skin breakdown, if present notify provider.

## 2020-09-01 NOTE — Assessment & Plan Note (Signed)
Chronic, ongoing in patient with history of renal cell CA.  A1C today 7.3%, slight trend up due to diet indiscretions.  Continue current medication regimen and adjust as needed,  Monitor BS daily in morning at home.  Continue Metformin, consider use of alternate options such as GLP if elevations present next visit, due to his age and risk for hypoglycemia. Could trial SGLT2, but with urinary symptoms may not tolerate.  Return to office in 3 months.

## 2020-09-01 NOTE — Progress Notes (Signed)
BP 132/80   Pulse 78   Temp 98 F (36.7 C) (Oral)   Wt 205 lb 3.2 oz (93.1 kg)   SpO2 97%   BMI 30.92 kg/m    Subjective:    Patient ID: Jason Contreras, male    DOB: [birth year], 80 y.o.   MRN: [id number]  HPI: Jason Contreras is a 80 y.o. male  Chief Complaint  Patient presents with  . Hyperlipidemia  . Hypertension  . Diabetes   DIABETES Continues on Metformin 1000 MG BID, discontinued Actos and Amaryl at past visits.  Previous A1C in January 6.5%.  Has been eating more drop cookies recently. Hypoglycemic episodes:no Polydipsia/polyuria: no Visual disturbance: no Chest pain: no Paresthesias: no Glucose Monitoring: yes             Accucheck frequency: not checking             Fasting glucose:              Post prandial:             Evening:             Before meals: Taking Insulin?: no             Long acting insulin:             Short acting insulin: Blood Pressure Monitoring: daily Retinal Examination: Not up to Date Foot Exam: Up to Date Pneumovax: Not up to Date Influenza: Up to Date Aspirin: no   HYPERTENSION / HYPERLIPIDEMIA Continues on Coreg 6.25 MG BID + Atorvastatin 20 MG daily. Recent LDL in July was 64.   Satisfied with current treatment? yes Duration of hypertension: chronic BP monitoring frequency: daily BP range: 130/80 range BP medication side effects: no Duration of hyperlipidemia: chronic Cholesterol medication side effects: no Cholesterol supplements: none Medication compliance: good compliance Aspirin: no Recent stressors: no Recurrent headaches: no Visual changes: no Palpitations: no Dyspnea: no Chest pain: no Lower extremity edema: no Dizzy/lightheaded: no   RHEUMATOID ARTHRITIS He is currently continuing to be followed by rheumatology for RA of multiple sites with negative Rf.  Last visit 07/04/20.  Taking Methotrexate and Duloxetine. Pain control status: stable Duration: chronic Location: multiple areas What Activities  task can be accomplished with current medication? ADL's daily Previous pain specialty evaluation: yes Non-narcotic analgesic meds: yes  ELEVATED PSA: Being followed by urology.  Last PSA 4.1 and last visit with urology on 04/19/20. Had negative biopsy, but findings were on imaging so surveillance. Has history of kidney cancer to right side, had two operations on this.  Was started on Finasteride at recent urology visit and continues Tamsulosin.  VITAMIN D DEFICIENCY: Level was 28.6 on recent labs, previous 36.6.  Started on supplement at past visits.  No recent falls or fractures. Taking B12 supplements for history of low levels, recent level 334.  Relevant past medical, surgical, family and social history reviewed and updated as indicated. Interim medical history since our last visit reviewed. Allergies and medications reviewed and updated.  Review of Systems  Constitutional: Negative for activity change, diaphoresis, fatigue and fever.  Respiratory: Negative for cough, chest tightness, shortness of breath and wheezing.   Cardiovascular: Negative for chest pain, palpitations and leg swelling.  Gastrointestinal: Negative.   Endocrine: Negative for polydipsia, polyphagia and polyuria.  Neurological: Negative for dizziness, syncope, weakness and numbness.  Psychiatric/Behavioral: Negative.     Per HPI unless specifically indicated above     Objective:  BP 132/80   Pulse 78   Temp 98 F (36.7 C) (Oral)   Wt 205 lb 3.2 oz (93.1 kg)   SpO2 97%   BMI 30.92 kg/m   Wt Readings from Last 3 Encounters:  09/01/20 205 lb 3.2 oz (93.1 kg)  06/02/20 201 lb 12.8 oz (91.5 kg)  02/28/20 203 lb (92.1 kg)    Physical Exam Vitals and nursing note reviewed.  Constitutional:      General: He is awake.     Appearance: He is well-developed and overweight.  HENT:     Head: Normocephalic and atraumatic.     Right Ear: Hearing normal. No drainage.     Left Ear: Hearing normal. No drainage.   Eyes:     General: Lids are normal.        Right eye: No discharge.        Left eye: No discharge.     Conjunctiva/sclera: Conjunctivae normal.     Pupils: Pupils are equal, round, and reactive to light.  Neck:     Thyroid: No thyromegaly.     Vascular: No carotid bruit or JVD.     Trachea: Trachea normal.  Cardiovascular:     Rate and Rhythm: Normal rate and regular rhythm.     Heart sounds: Normal heart sounds, S1 normal and S2 normal. No murmur heard. No gallop.   Pulmonary:     Effort: Pulmonary effort is normal. No accessory muscle usage or respiratory distress.     Breath sounds: Normal breath sounds.  Abdominal:     General: Bowel sounds are normal.     Palpations: Abdomen is soft.  Musculoskeletal:        General: Normal range of motion.     Cervical back: Normal range of motion and neck supple.     Right lower leg: No edema.     Left lower leg: No edema.  Skin:    General: Skin is warm and dry.     Capillary Refill: Capillary refill takes less than 2 seconds.     Comments: Scattered pale purple/yellow bruises noted to BUE.  Neurological:     Mental Status: He is alert and oriented to person, place, and time.     Cranial Nerves: Cranial nerves are intact.     Coordination: Coordination is intact.     Gait: Gait is intact.     Deep Tendon Reflexes: Reflexes are normal and symmetric.     Reflex Scores:      Brachioradialis reflexes are 2+ on the right side and 2+ on the left side.      Patellar reflexes are 2+ on the right side and 2+ on the left side. Psychiatric:        Mood and Affect: Mood normal.        Behavior: Behavior normal. Behavior is cooperative.        Thought Content: Thought content normal.        Judgment: Judgment normal.     Results for orders placed or performed in visit on 06/02/20  Bayer DCA Hb A1c Waived  Result Value Ref Range   HB A1C (BAYER DCA - WAIVED) 6.5 <7.0 %  CBC with Differential/Platelet  Result Value Ref Range   WBC 6.4  3.4 - 10.8 x10E3/uL   RBC 4.24 4.14 - 5.80 x10E6/uL   Hemoglobin 13.8 13.0 - 17.7 g/dL   Hematocrit 81.142.1 91.437.5 - 51.0 %   MCV 99 (H) 79 - 97 fL  MCH 32.5 26.6 - 33.0 pg   MCHC 32.8 31.5 - 35.7 g/dL   RDW 13.9 11.6 - 15.4 %   Platelets 243 150 - 450 x10E3/uL   Neutrophils 73 Not Estab. %   Lymphs 17 Not Estab. %   Monocytes 7 Not Estab. %   Eos 1 Not Estab. %   Basos 1 Not Estab. %   Neutrophils Absolute 4.7 1.4 - 7.0 x10E3/uL   Lymphocytes Absolute 1.1 0.7 - 3.1 x10E3/uL   Monocytes Absolute 0.4 0.1 - 0.9 x10E3/uL   EOS (ABSOLUTE) 0.1 0.0 - 0.4 x10E3/uL   Basophils Absolute 0.0 0.0 - 0.2 x10E3/uL   Immature Granulocytes 1 Not Estab. %   Immature Grans (Abs) 0.0 0.0 - 0.1 x10E3/uL  Comprehensive metabolic panel  Result Value Ref Range   Glucose 196 (H) 65 - 99 mg/dL   BUN 12 8 - 27 mg/dL   Creatinine, Ser 0.82 0.76 - 1.27 mg/dL   GFR calc non Af Amer 85 >59 mL/min/1.73   GFR calc Af Amer 99 >59 mL/min/1.73   BUN/Creatinine Ratio 15 10 - 24   Sodium 140 134 - 144 mmol/L   Potassium 4.7 3.5 - 5.2 mmol/L   Chloride 101 96 - 106 mmol/L   CO2 22 20 - 29 mmol/L   Calcium 9.4 8.6 - 10.2 mg/dL   Total Protein 6.1 6.0 - 8.5 g/dL   Albumin 4.1 3.7 - 4.7 g/dL   Globulin, Total 2.0 1.5 - 4.5 g/dL   Albumin/Globulin Ratio 2.1 1.2 - 2.2   Bilirubin Total 0.5 0.0 - 1.2 mg/dL   Alkaline Phosphatase 68 44 - 121 IU/L   AST 21 0 - 40 IU/L   ALT 21 0 - 44 IU/L  Lipid Panel w/o Chol/HDL Ratio  Result Value Ref Range   Cholesterol, Total 138 100 - 199 mg/dL   Triglycerides 155 (H) 0 - 149 mg/dL   HDL 44 >39 mg/dL   VLDL Cholesterol Cal 27 5 - 40 mg/dL   LDL Chol Calc (NIH) 67 0 - 99 mg/dL  TSH  Result Value Ref Range   TSH 1.470 0.450 - 4.500 uIU/mL  VITAMIN D 25 Hydroxy (Vit-D Deficiency, Fractures)  Result Value Ref Range   Vit D, 25-Hydroxy 28.6 (L) 30.0 - 100.0 ng/mL  Vitamin B12  Result Value Ref Range   Vitamin B-12 334 232 - 1,245 pg/mL  Microalbumin, Urine Waived  Result  Value Ref Range   Microalb, Ur Waived 10 0 - 19 mg/L   Creatinine, Urine Waived 50 10 - 300 mg/dL   Microalb/Creat Ratio <30 <30 mg/g      Assessment & Plan:   Problem List Items Addressed This Visit      Cardiovascular and Mediastinum   Hypertension associated with diabetes ([place])    Chronic, stable with BP at goal today in office on recheck.  Recommend he monitor BP at home at least three mornings a week + focus on DASH diet.  Continue current medication regimen and adjust as needed.  Labs up to date.  Return in 3 months.      Relevant Orders   Bayer DCA Hb A1c Waived   Senile purpura (HCC)    Chronic, stable.  Recommend continue to perform gentle skin care and monitor for skin breakdown, if present notify provider.      Aortic atherosclerosis ([place])    Noted on CXR 07/20/19.  Continue statin and ASA daily for prevention.  Monitor closely.  Endocrine   DM due to underlying condition with diabetic nephropathy ([place]) - Primary    Chronic, ongoing in patient with history of renal cell CA.  A1C today 7.3%, slight trend up due to diet indiscretions.  Continue current medication regimen and adjust as needed,  Monitor BS daily in morning at home.  Continue Metformin, consider use of alternate options such as GLP if elevations present next visit, due to his age and risk for hypoglycemia. Could trial SGLT2, but with urinary symptoms may not tolerate.  Return to office in 3 months.      Relevant Orders   Bayer DCA Hb A1c Waived   Hyperlipidemia associated with type 2 diabetes mellitus (HCC)    Chronic, ongoing.  Continue current medication regimen and adjust as needed.  Lipid panel up to date.      Relevant Orders   Bayer DCA Hb A1c Waived     Musculoskeletal and Integument   Rheumatoid arthritis of multiple sites with negative rheumatoid factor (HCC)    Chronic, ongoing.  Followed by rheumatology with much improved functional status since initially meeting this patient one year  ago.  Continue this collaboration and current regimen as prescribed by them.  Recent note reviewed.        Other   Elevated PSA    Continue collaboration with urology.  Recent note reviewed.  PSA monitored by them.      Personal history of renal cancer    Followed by urology, continue this collaboration and review notes when present.       Vitamin D deficiency    Ongoing.  Continue daily supplement and recheck level today.      Relevant Orders   VITAMIN D 25 Hydroxy (Vit-D Deficiency, Fractures)   B12 deficiency    Ongoing and improving with supplement with recent level at goal.  Recheck today and consider reducing or discontinuing supplement as needed.      Relevant Orders   Vitamin B12       Follow up plan: Return in about 3 months (around 12/01/2020) for T2DM, HTN/HLD, PSA, RA.

## 2020-09-01 NOTE — Assessment & Plan Note (Signed)
Ongoing.  Continue daily supplement and recheck level today.

## 2020-09-01 NOTE — Assessment & Plan Note (Addendum)
Chronic, stable with BP at goal today in office on recheck.  Recommend he monitor BP at home at least three mornings a week + focus on DASH diet.  Continue current medication regimen and adjust as needed.  Labs up to date.  Return in 3 months.

## 2020-09-01 NOTE — Patient Instructions (Signed)
Diabetes Mellitus and Nutrition, Adult When you have diabetes, or diabetes mellitus, it is very important to have healthy eating habits because your blood sugar (glucose) levels are greatly affected by what you eat and drink. Eating healthy foods in the right amounts, at about the same times every day, can help you:  Control your blood glucose.  Lower your risk of heart disease.  Improve your blood pressure.  Reach or maintain a healthy weight. What can affect my meal plan? Every person with diabetes is different, and each person has different needs for a meal plan. Your health care provider may recommend that you work with a dietitian to make a meal plan that is best for you. Your meal plan may vary depending on factors such as:  The calories you need.  The medicines you take.  Your weight.  Your blood glucose, blood pressure, and cholesterol levels.  Your activity level.  Other health conditions you have, such as heart or kidney disease. How do carbohydrates affect me? Carbohydrates, also called carbs, affect your blood glucose level more than any other type of food. Eating carbs naturally raises the amount of glucose in your blood. Carb counting is a method for keeping track of how many carbs you eat. Counting carbs is important to keep your blood glucose at a healthy level, especially if you use insulin or take certain oral diabetes medicines. It is important to know how many carbs you can safely have in each meal. This is different for every person. Your dietitian can help you calculate how many carbs you should have at each meal and for each snack. How does alcohol affect me? Alcohol can cause a sudden decrease in blood glucose (hypoglycemia), especially if you use insulin or take certain oral diabetes medicines. Hypoglycemia can be a life-threatening condition. Symptoms of hypoglycemia, such as sleepiness, dizziness, and confusion, are similar to symptoms of having too much  alcohol.  Do not drink alcohol if: ? Your health care provider tells you not to drink. ? You are pregnant, may be pregnant, or are planning to become pregnant.  If you drink alcohol: ? Do not drink on an empty stomach. ? Limit how much you use to:  0-1 drink a day for women.  0-2 drinks a day for men. ? Be aware of how much alcohol is in your drink. In the [place] one drink equals one 12 oz bottle of beer (355 mL), one 5 oz glass of wine (148 mL), or one 1 oz glass of hard liquor (44 mL). ? Keep yourself hydrated with water, diet soda, or unsweetened iced tea.  Keep in mind that regular soda, juice, and other mixers may contain a lot of sugar and must be counted as carbs. What are tips for following this plan? Reading food labels  Start by checking the serving size on the "Nutrition Facts" label of packaged foods and drinks. The amount of calories, carbs, fats, and other nutrients listed on the label is based on one serving of the item. Many items contain more than one serving per package.  Check the total grams (g) of carbs in one serving. You can calculate the number of servings of carbs in one serving by dividing the total carbs by 15. For example, if a food has 30 g of total carbs per serving, it would be equal to 2 servings of carbs.  Check the number of grams (g) of saturated fats and trans fats in one serving. Choose foods that have   a low amount or none of these fats.  Check the number of milligrams (mg) of salt (sodium) in one serving. Most people should limit total sodium intake to less than 2,300 mg per day.  Always check the nutrition information of foods labeled as "low-fat" or "nonfat." These foods may be higher in added sugar or refined carbs and should be avoided.  Talk to your dietitian to identify your daily goals for nutrients listed on the label. Shopping  Avoid buying canned, pre-made, or processed foods. These foods tend to be high in fat, sodium, and added  sugar.  Shop around the outside edge of the grocery store. This is where you will most often find fresh fruits and vegetables, bulk grains, fresh meats, and fresh dairy. Cooking  Use low-heat cooking methods, such as baking, instead of high-heat cooking methods like deep frying.  Cook using healthy oils, such as olive, canola, or sunflower oil.  Avoid cooking with butter, cream, or high-fat meats. Meal planning  Eat meals and snacks regularly, preferably at the same times every day. Avoid going long periods of time without eating.  Eat foods that are high in fiber, such as fresh fruits, vegetables, beans, and whole grains. Talk with your dietitian about how many servings of carbs you can eat at each meal.  Eat 4-6 oz (112-168 g) of lean protein each day, such as lean meat, chicken, fish, eggs, or tofu. One ounce (oz) of lean protein is equal to: ? 1 oz (28 g) of meat, chicken, or fish. ? 1 egg. ?  cup (62 g) of tofu.  Eat some foods each day that contain healthy fats, such as avocado, nuts, seeds, and fish.   What foods should I eat? Fruits Berries. Apples. Oranges. Peaches. Apricots. Plums. Grapes. Mango. Papaya. Pomegranate. Kiwi. Cherries. Vegetables Lettuce. Spinach. Leafy greens, including kale, chard, collard greens, and mustard greens. Beets. Cauliflower. Cabbage. Broccoli. Carrots. Green beans. Tomatoes. Peppers. Onions. Cucumbers. Brussels sprouts. Grains Whole grains, such as whole-wheat or whole-grain bread, crackers, tortillas, cereal, and pasta. Unsweetened oatmeal. Quinoa. Brown or wild rice. Meats and other proteins Seafood. Poultry without skin. Lean cuts of poultry and beef. Tofu. Nuts. Seeds. Dairy Low-fat or fat-free dairy products such as milk, yogurt, and cheese. The items listed above may not be a complete list of foods and beverages you can eat. Contact a dietitian for more information. What foods should I avoid? Fruits Fruits canned with  syrup. Vegetables Canned vegetables. Frozen vegetables with butter or cream sauce. Grains Refined white flour and flour products such as bread, pasta, snack foods, and cereals. Avoid all processed foods. Meats and other proteins Fatty cuts of meat. Poultry with skin. Breaded or fried meats. Processed meat. Avoid saturated fats. Dairy Full-fat yogurt, cheese, or milk. Beverages Sweetened drinks, such as soda or iced tea. The items listed above may not be a complete list of foods and beverages you should avoid. Contact a dietitian for more information. Questions to ask a health care provider  Do I need to meet with a diabetes educator?  Do I need to meet with a dietitian?  What number can I call if I have questions?  When are the best times to check my blood glucose? Where to find more information:  American Diabetes Association: diabetes.org  Academy of Nutrition and Dietetics: www.eatright.org  National Institute of Diabetes and Digestive and Kidney Diseases: www.niddk.nih.gov  Association of Diabetes Care and Education Specialists: www.diabeteseducator.org Summary  It is important to have healthy eating   habits because your blood sugar (glucose) levels are greatly affected by what you eat and drink.  A healthy meal plan will help you control your blood glucose and maintain a healthy lifestyle.  Your health care provider may recommend that you work with a dietitian to make a meal plan that is best for you.  Keep in mind that carbohydrates (carbs) and alcohol have immediate effects on your blood glucose levels. It is important to count carbs and to use alcohol carefully. This information is not intended to replace advice given to you by your health care provider. Make sure you discuss any questions you have with your health care provider. Document Revised: 03/30/2019 Document Reviewed: 03/30/2019 Elsevier Patient Education  2021 Elsevier Inc.  

## 2020-09-01 NOTE — Assessment & Plan Note (Signed)
Followed by urology, continue this collaboration and review notes when present. ? ?

## 2020-09-01 NOTE — Assessment & Plan Note (Signed)
Continue collaboration with urology.  Recent note reviewed.  PSA monitored by them.   

## 2020-09-01 NOTE — Assessment & Plan Note (Signed)
Ongoing and improving with supplement with recent level at goal.  Recheck today and consider reducing or discontinuing supplement as needed.

## 2020-09-02 LAB — VITAMIN D 25 HYDROXY (VIT D DEFICIENCY, FRACTURES): Vit D, 25-Hydroxy: 32.6 ng/mL (ref 30.0–100.0)

## 2020-09-02 LAB — VITAMIN B12: Vitamin B-12: 314 pg/mL (ref 232–1245)

## 2020-09-02 NOTE — Progress Notes (Signed)
Contacted via MyChart   Good morning Jason Contreras, your labs have returned.  B12 level remains on lower side of normal.  I would recommend you take 500 MCG Vitamin B12 daily, especially since you are on long term Metformin which can lower B12 levels.  B12 is important for overall nervous system health, including the brain.  Vitamin D level is normal this check, continue your supplement daily.  Any questions? Keep being amazing!!  Thank you for allowing me to participate in your care. Kindest regards, Yoko Mcgahee

## 2020-09-04 ENCOUNTER — Ambulatory Visit (INDEPENDENT_AMBULATORY_CARE_PROVIDER_SITE_OTHER): Payer: Medicare Other | Admitting: Pharmacist

## 2020-09-04 DIAGNOSIS — I10 Essential (primary) hypertension: Secondary | ICD-10-CM

## 2020-09-04 DIAGNOSIS — E0821 Diabetes mellitus due to underlying condition with diabetic nephropathy: Secondary | ICD-10-CM

## 2020-09-04 NOTE — Progress Notes (Signed)
Chronic Care Management Pharmacy Note  09/14/2020 Name:  Jason Contreras MRN:  [id number] DOB:  [birth year]  Subjective: Jason Contreras is an 80 y.o. year old male who is a primary patient of [name], [name], NP.  The CCM team was consulted for assistance with disease management and care coordination needs.    Engaged with patient by telephone for follow up visit in response to provider referral for pharmacy case management and/or care coordination services.   Consent to Services:  The patient was given information about Chronic Care Management services, agreed to services, and gave verbal consent prior to initiation of services.  Please see initial visit note for detailed documentation.   Patient Care Team: Venita Lick, NP as PCP - General (Nurse Practitioner) [name], MD as Consulting Physician (Urology) [name], MD as Consulting Physician (Interventional Radiology) Vanita Ingles, RN as Case Manager ([place]) Vladimir Faster, [facility] as Pharmacist (Pharmacist)  Recent office visits: 4/29/22Ned Card (PCP)- blood work, take B12 500 mcg qd  Recent consult visits: 09/05/20- ABD CT w contrast surveillance h/o renal cancer 3/01/22Posey Pronto (rheumatology)- MTX 15 mg q 7 d, RA stage 2 moderate, blood work   Hospital visits: None in previous 6 months  Objective:  Lab Results  Component Value Date   CREATININE 0.80 09/05/2020   BUN 12 06/02/2020   GFRNONAA 85 06/02/2020   GFRAA 99 06/02/2020   NA 140 06/02/2020   K 4.7 06/02/2020   CALCIUM 9.4 06/02/2020   CO2 22 06/02/2020   GLUCOSE 196 (H) 06/02/2020    Lab Results  Component Value Date/Time   HGBA1C 7.3 (H) 09/01/2020 02:22 PM   HGBA1C 6.5 06/02/2020 02:53 PM   HGBA1C 6.3 12/27/2015 12:00 AM   MICROALBUR 10 06/14/2020 11:04 AM   MICROALBUR 10 05/28/2019 09:12 AM    Last diabetic Eye exam:  Lab Results  Component Value Date/Time   HMDIABEYEEXA No Retinopathy 05/03/2016 01:55 PM    Last  diabetic Foot exam: No results found for: HMDIABFOOTEX   Lab Results  Component Value Date   CHOL 138 06/02/2020   HDL 44 06/02/2020   LDLCALC 67 06/02/2020   TRIG 155 (H) 06/02/2020   CHOLHDL 2.5 03/12/2018    Hepatic Function Latest Ref Rng & Units 06/02/2020 05/28/2019 10/13/2018  Total Protein 6.0 - 8.5 g/dL 6.1 6.5 -  Albumin 3.7 - 4.7 g/dL 4.1 4.0 -  AST 0 - 40 IU/L '21 13 16  ' ALT 0 - 44 IU/L '21 8 19  ' Alk Phosphatase 44 - 121 IU/L 68 99 -  Total Bilirubin 0.0 - 1.2 mg/dL 0.5 0.5 -    Lab Results  Component Value Date/Time   TSH 1.470 06/02/2020 02:54 PM   TSH 3.410 05/28/2019 11:47 AM    CBC Latest Ref Rng & Units 06/02/2020 07/12/2019 05/28/2019  WBC 3.4 - 10.8 x10E3/uL 6.4 7.3 5.1  Hemoglobin 13.0 - 17.7 g/dL 13.8 13.0 12.8(L)  Hematocrit 37.5 - 51.0 % 42.1 39.1 38.7  Platelets 150 - 450 x10E3/uL 243 287 308    Lab Results  Component Value Date/Time   VD25OH 32.6 09/01/2020 03:25 PM   VD25OH 28.6 (L) 06/02/2020 02:54 PM    Clinical ASCVD: Yes  The 10-year ASCVD risk score Mikey Bussing DC Jr., et al., 2013) is: 60.1%   Values used to calculate the score:     Age: 89 years     Sex: Male     Is Non-Hispanic African American: No  Diabetic: Yes     Tobacco smoker: No     Systolic Blood Pressure: 497 mmHg     Is BP treated: Yes     HDL Cholesterol: 44 mg/dL     Total Cholesterol: 138 mg/dL    Depression screen [facility] 2/9 06/02/2020 01/31/2020 06/11/2019  Decreased Interest 0 0 0  Down, Depressed, Hopeless 0 0 0  PHQ - 2 Score 0 0 0  Altered sleeping 0 - -  Tired, decreased energy 0 - -  Change in appetite 0 - -  Feeling bad or failure about yourself  0 - -  Trouble concentrating 0 - -  Moving slowly or fidgety/restless 0 - -  Suicidal thoughts 0 - -  PHQ-9 Score 0 - -  Difficult doing work/chores - - -       Social History   Tobacco Use  Smoking Status Never Smoker  Smokeless Tobacco Never Used   BP Readings from Last 3 Encounters:  09/07/20 (!) 150/84   09/01/20 132/80  06/02/20 124/80   Pulse Readings from Last 3 Encounters:  09/07/20 88  09/01/20 78  06/02/20 (!) 102   Wt Readings from Last 3 Encounters:  09/07/20 205 lb (93 kg)  09/01/20 205 lb 3.2 oz (93.1 kg)  06/02/20 201 lb 12.8 oz (91.5 kg)   BMI Readings from Last 3 Encounters:  09/07/20 29.41 kg/m  09/01/20 30.92 kg/m  06/02/20 30.41 kg/m    Assessment/Interventions: Review of patient past medical history, allergies, medications, health status, including review of consultants reports, laboratory and other test data, was performed as part of comprehensive evaluation and provision of chronic care management services.   SDOH:  (Social Determinants of Health) assessments and interventions performed: No  SDOH Screenings   Alcohol Screen: Not on file  Depression (PHQ2-9): Low Risk   . PHQ-2 Score: 0  Financial Resource Strain: Low Risk   . Difficulty of Paying Living Expenses: Not hard at all  Food Insecurity: No Food Insecurity  . Worried About Charity fundraiser in the Last Year: Never true  . Ran Out of Food in the Last Year: Never true  Housing: Not on file  Physical Activity: Inactive  . Days of Exercise per Week: 0 days  . Minutes of Exercise per Session: 0 min  Social Connections: Not on file  Stress: No Stress Concern Present  . Feeling of Stress : Not at all  Tobacco Use: Low Risk   . Smoking Tobacco Use: Never Smoker  . Smokeless Tobacco Use: Never Used  Transportation Needs: No Transportation Needs  . Lack of Transportation (Medical): No  . Lack of Transportation (Non-Medical): No     Immunization History  Administered Date(s) Administered  . Fluad Quad(high Dose 65+) 01/25/2019, 03/13/2020  . Influenza, High Dose Seasonal PF 03/25/2016, 01/13/2017, 03/12/2018  . Influenza,inj,Quad PF,6+ Mos 03/13/2015  . Influenza-Unspecified 04/18/2014  . PFIZER(Purple Top)SARS-COV-2 Vaccination 05/12/2019, 06/05/2019, 02/25/2020  . Pneumococcal  Conjugate-13 10/19/2013  . Pneumococcal Polysaccharide-23 09/03/1997, 02/26/2005, 10/10/2019, 10/11/2019  . Td 02/17/2008    Conditions to be addressed/monitored:  Hypertension, Hyperlipidemia, Diabetes, Coronary Artery Disease, BPH and Rheumatoid arthritis  Care Plan : [place]  Updates made by Vladimir Faster, [place] since 09/14/2020 12:00 AM    Problem: DM, HTN, HLD, BPH, Rheumatoid arthritis   Priority: High    Goal: Patient-Specific Goal     Goal: disease management   Start Date: 09/04/2020  This Visit's Progress: On track  Priority: High  Note:   Current Barriers:  . Unable to maintain control of diabetes . Suboptimal therapeutic regimen for diabetes  Pharmacist Clinical Goal(s):  [name] Patient will maintain control of diabetes as evidenced by A1c and home readings  through collaboration with PharmD and provider.   Interventions: . 1:1 collaboration with Venita Lick, NP regarding development and update of comprehensive plan of care as evidenced by provider attestation and co-signature . Inter-disciplinary care team collaboration (see longitudinal plan of care) . Comprehensive medication review performed; medication list updated in electronic medical record  BP Readings from Last 3 Encounters:  09/01/20 132/80  06/02/20 124/80  04/19/20 127/74    .Hypertension (BP goal <130/80) -Controlled -Current treatment: . Carvedilol 6.25 mg bid -Medications previously tried: NA  -Current home readings: 130-140/70-80s -Current dietary habits: [place]'t eat a lot of meats -Current exercise habits: no structured regimen, works in his building most days -Denies hypotensive/hypertensive symptoms -Educated on Daily salt intake goal < 2300 mg; Exercise goal of 150 minutes per week; Importance of home blood pressure monitoring; -Counseled to monitor BP at home 2-3 times weekly, document, and provide log at future appointments -Recommended to continue current  medication  Lab Results  Component Value Date   [place] 67 06/02/2020   The 10-year ASCVD risk score Mikey Bussing DC Jr., et al., 2013) is: 51.9% The 10-year ASCVD risk score Mikey Bussing DC Brooke Bonito., et al., 2013) is: 51.9%   Values used to calculate the score:     Age: 49 years     Sex: Male     Is Non-Hispanic African American: No     Diabetic: Yes     Tobacco smoker: No     Systolic Blood Pressure: 536 mmHg     Is BP treated: Yes     HDL Cholesterol: 44 mg/dL     Total Cholesterol: 138 mg/dL  Hyperlipidemia/ CAD: (LDL goal < 70) -Controlled -Current treatment: . Atorvastatin 20 mg qd -Medications previously tried: NA -Educated on Benefits of statin for ASCVD risk reduction; Importance of limiting foods high in cholesterol; Exercise goal of 150 minutes per week; -Counseled on diet and exercise extensively Recommended to continue current medication Lab Results  Component Value Date   HGBA1C 7.3 (H) 09/01/2020    Diabetes (A1c goal <7%) -Not ideally controlled    -Current medications: [name] Metformin 1000 mg bid -Medications previously tried: NA -Current home glucose readings have not checked since 4/13 when spouse had eye surgery. Will resume tomorrow.  . fasting glucose:  . post prandial glucose:  -Denies hypoglycemic/hyperglycemic symptoms -Current meal patterns:  -wife reports making drop cookies while daughter was home  -Educated on A1c and blood sugar goals; Complications of diabetes including kidney damage, retinal damage, and cardiovascular disease; Benefits of routine self-monitoring of blood sugar; Patient schedule for CT with contrast tomorrow.  -Counseled to check feet daily and get yearly eye exams -Recommended to continue current medication   Rheumatoid arthritis (Goal: improve functioning , prevent AE's) -Controlled Follows with rheumatology -Current treatment  . Methotrexate 15 mg q week . Folic acid 1 mg qd -Medications previously tried: leflunomide -  diarrhea -Counseled on diet and exercise extensively Recommended patient continue B complex with folic acid. Dosages of 5 mg folate daily have been used in studies. Advised patient to disclose to rheumatologist for completeness.  BPH/ H/o renal cancer partioal neprectomy (Goal: reduce sympotms) -Controlled follows with Urology -Current treatment  . Finasteride 5 mg qd . tamsulosin 0.4 mg qd -Medications previously tried: NA -Recommended  to continue current medication  Last  Vitamin D level 32.6 Last B12 level 314 Health Maintenance -Vaccine gaps: NOne -Current therapy:  [name] Vitamin d 2000 units daily . bcomplex-c - folic acid  dialy . Folic acid 1 mg qd . Vitamin c 500 mg qd -Educated on Supplements may interfere with prescription drugs . Always discuss with prescriber of PharmD before starting. -Patient is satisfied with current therapy and denies issues -Recommended to continue current medication Recommended patient verify b complex contains 500 mcg of B12 per PCP recommendations.   Patient Goals/Self-Care Activities . Patient will:  - take medications as prescribed check glucose 3- 5 times weekly, document, and provide at future appointments check blood pressure 2-3 times weekly, document, and provide at future appointments target a minimum of 150 minutes of moderate intensity exercise weekly  Follow Up Plan: Telephone follow up appointment with care management team member scheduled for: 6-8 weeks CPA, 3 months PharmD         Problem: CHL AMB "PATIENT-SPECIFIC PROBLEM"        Medication Assistance: None required.  Patient affirms current coverage meets needs.  Patient's preferred pharmacy is:  [facility], [address], [address] [place], [place] [address] Phone: [phone] Fax: [phone]  [facility] - [place], [place] - [address] [place] [address] Phone: [phone]  Fax: [phone]  Uses pill box? Yes Pt endorses 95% compliance  We discussed: Current pharmacy is preferred with insurance plan and patient is satisfied with pharmacy services Patient decided to: Continue current medication management strategy  Care Plan and Follow Up Patient Decision:  Patient agrees to Care Plan and Follow-up.  Plan: Telephone follow up appointment with care management team member scheduled for:  6-8 weeks CPA , 3 months PharmD  Junita Push. Kenton Kingfisher PharmD, [facility] [phone]

## 2020-09-05 ENCOUNTER — Other Ambulatory Visit: Payer: Self-pay

## 2020-09-05 ENCOUNTER — Other Ambulatory Visit: Payer: Medicare Other

## 2020-09-05 ENCOUNTER — Ambulatory Visit
Admission: RE | Admit: 2020-09-05 | Discharge: 2020-09-05 | Disposition: A | Discharge status: Home / Self Care | Payer: Medicare Other | Source: Ambulatory Visit | Attending: Urology | Admitting: Urology

## 2020-09-05 DIAGNOSIS — Z85528 Personal history of other malignant neoplasm of kidney: Secondary | ICD-10-CM | POA: Diagnosis not present

## 2020-09-05 DIAGNOSIS — K5750 Diverticulosis of both small and large intestine without perforation or abscess without bleeding: Secondary | ICD-10-CM | POA: Diagnosis not present

## 2020-09-05 DIAGNOSIS — I700 Atherosclerosis of aorta: Secondary | ICD-10-CM | POA: Diagnosis not present

## 2020-09-05 DIAGNOSIS — C641 Malignant neoplasm of right kidney, except renal pelvis: Secondary | ICD-10-CM | POA: Insufficient documentation

## 2020-09-05 DIAGNOSIS — R972 Elevated prostate specific antigen [PSA]: Secondary | ICD-10-CM

## 2020-09-05 DIAGNOSIS — Z905 Acquired absence of kidney: Secondary | ICD-10-CM | POA: Diagnosis not present

## 2020-09-05 LAB — POCT I-STAT CREATININE: Creatinine, Ser: 0.8 mg/dL (ref 0.61–1.24)

## 2020-09-05 MED ORDER — IOHEXOL 300 MG/ML  SOLN
100.0000 mL | Freq: Once | INTRAMUSCULAR | Status: AC | PRN
  Administered 2020-09-05: 100 mL via INTRAVENOUS

## 2020-09-06 LAB — PSA: Prostate Specific Ag, Serum: 4 ng/mL (ref 0.0–4.0)

## 2020-09-06 NOTE — Progress Notes (Signed)
09/07/2020  12:39 PM   Jason Contreras 09-09-1940 [id number]  Referring provider: Venita Lick, NP [address] [place],  [place] [address] Chief Complaint  Patient presents with  . Renal cell cancer, right [facility])    HPI: Jason Contreras is a 80 y.o. male with a personal history of renal cell carcinoma, hematuria, right renal mass, and elevated PSA, who returns today for a 4 month follow up.  DRE 6/20 by Carlis Stable which was unremarkable.Prostate biopsy in 2017 was negative with TRUS volume of 41.  His MRI of prostate from 07/23/19 revealedPI-RADS category 5 lesion of the right anterior transition zone and right anterior fibromuscular stroma at the prostate base and mid gland. Lesion measures 2.1 by 1.4 by 2.2 cm.  He underwent a prostate bx on 09/02/19 which was unremarkable, no evidence of malignancy.  Patient's history of renal cell carcinoma was status post partial nephrectomy (12/2015) 1.8 cm papillary renal cell carcinoma, that was followed by cryoablation (10/2017) which was a clear cell carcinoma, for a secondary lesion. Most recent CT was 09/06/2020, which showed changed of prior right lower pole partial nephrectomy and cryoablation without evidence of recurrence or abdominopelvic metastases. Please see previous notes for more details.  PSA trend: 5.3 - 01/12/2018 5.9 - 10/08/2018 10.3 - 05/28/2019 13.3 - 07/06/2019 5.7 - 11/05/2019 (started Finasteride 2 months prior) 4.1 - 04/12/2020 4.0 - 09/05/2020  Started on Finasteride in 09/2019.  Also on flomax.  Today he reported that he has not had any nocturia for the last few weeks. The last time he saw MD he was urinating and defecating on himself because of the previous medication he was prescribed, and then this was changed to Methotrexate, and this has fixed this incontinence. He is pleased with this.  IPSS is 13.     IPSS    Row Name 09/07/20 1000         International Prostate Symptom Score   How often have you had the  sensation of not emptying your bladder? Less than half the time     How often have you had to urinate less than every two hours? Less than half the time     How often have you found you stopped and started again several times when you urinated? Less than half the time     How often have you found it difficult to postpone urination? Less than half the time     How often have you had a weak urinary stream? Less than half the time     How often have you had to strain to start urination? Less than 1 in 5 times     How many times did you typically get up at night to urinate? 2 Times     Total IPSS Score 13           Quality of Life due to urinary symptoms   If you were to spend the rest of your life with your urinary condition just the way it is now how would you feel about that? Mostly Satisfied            Score:  1-7 Mild 8-19 Moderate 20-35 Severe   PMH: Past Medical History:  Diagnosis Date  . Adenocarcinoma, renal cell ([place])   . Arthritis   . Collagen vascular disease ([place])   . Diabetes mellitus without complication ([place])   . Elevated PSA   . Headache   . Hematuria   . Hyperlipidemia   .  Hypertension   . Neuropathy   . Obesity   . PONV (postoperative nausea and vomiting)   . Renal insufficiency   . Right renal mass     Surgical History: Past Surgical History:  Procedure Laterality Date  . APPENDECTOMY    . CRYOABLATION  10/17/2017  . IR RADIOLOGIST EVAL & MGMT  07/17/2017  . IR RADIOLOGIST EVAL & MGMT  08/13/2017  . IR RADIOLOGIST EVAL & MGMT  11/12/2017  . IR RADIOLOGIST EVAL & MGMT  03/05/2018  . RADIOLOGY WITH ANESTHESIA Right 10/17/2017   Procedure: CT WITH ANESTHESIA RENAL CRYOABLATION;  Surgeon: [name], MD;  Location: [facility] ORS;  Service: Anesthesiology;  Laterality: Right;  . ROBOTIC ASSITED PARTIAL NEPHRECTOMY Right 12/12/2015   Procedure: ROBOTIC ASSITED PARTIAL NEPHRECTOMY;  Surgeon: [name], MD;  Location: [facility] ORS;  Service: Urology;  Laterality:  Right;    Home Medications:  Allergies as of 09/07/2020      Reactions   Morphine Nausea And Vomiting   Morphine And Related Nausea And Vomiting      Medication List       Accurate as of Sep 07, 2020 12:39 PM. If you have any questions, ask your nurse or doctor.        acetaminophen 325 MG tablet Commonly known as: TYLENOL Take 650 mg by mouth every 6 (six) hours as needed. Takes at bedtime and occasionally in am for back/hip pain   atorvastatin 20 MG tablet Commonly known as: LIPITOR TAKE 1 TABLET BY MOUTH  DAILY AT 6 PM.   carvedilol 6.25 MG tablet Commonly known as: COREG Take 1 tablet (6.25 mg total) by mouth 2 (two) times daily.   cholecalciferol 25 MCG (1000 UNIT) tablet Commonly known as: VITAMIN D3 Take 2,000 Units by mouth daily.   DULoxetine 60 MG capsule Commonly known as: CYMBALTA Take 1 capsule (60 mg total) by mouth daily.   finasteride 5 MG tablet Commonly known as: PROSCAR Take 1 tablet (5 mg total) by mouth daily.   folic acid 1 MG tablet Commonly known as: FOLVITE Take by mouth.   gabapentin 600 MG tablet Commonly known as: NEURONTIN TAKE 1 TABLET BY MOUTH 3  TIMES DAILY   lidocaine 5 % Commonly known as: LIDODERM Place 1 patch onto the skin daily. Remove & Discard patch within 12 hours or as directed by MD   metFORMIN 500 MG tablet Commonly known as: GLUCOPHAGE TAKE 2 TABLETS BY MOUTH  TWICE DAILY   methotrexate 2.5 MG tablet Commonly known as: RHEUMATREX Take by mouth.   mometasone 0.1 % cream Commonly known as: ELOCON APPLY  CREAM TOPICALLY ONCE DAILY   Pfizer-BioNTech COVID-19 Vacc 30 MCG/0.3ML injection Generic drug: COVID-19 mRNA vaccine (Pfizer) USE AS DIRECTED   SUPER B COMPLEX/FA/VIT C PO Take 1 tablet by mouth daily. Contains [id number] mcg folic acid   tamsulosin 0.4 MG Caps capsule Commonly known as: FLOMAX Take 1 capsule (0.4 mg total) by mouth daily.   vitamin C 500 MG tablet Commonly known as: ASCORBIC ACID Take 500  mg by mouth daily.       Allergies: Allergies  Allergen Reactions  . Morphine Nausea And Vomiting  . Morphine And Related Nausea And Vomiting    Family History: Family History  Problem Relation Age of Onset  . Bone cancer Mother   . Cancer Mother   . Cancer Father   . Stroke Brother   . Kidney disease Neg Hx     Social History:   reports  that he has never smoked. He has never used smokeless tobacco. He reports that he does not drink alcohol and does not use drugs.  ROS: Pertinent ROS in HPI.  Physical Exam: BP (!) 150/84   Pulse 88   Ht 5\' 10"  (1.778 m)   Wt 205 lb (93 kg)   BMI 29.41 kg/m   Constitutional:  Alert and oriented, No acute distress. HEENT: [place] AT, moist mucus membranes.  Trachea midline, no masses. Cardiovascular: No clubbing, cyanosis, or edema. Respiratory: Normal respiratory effort, no increased work of breathing. Rectal: Prostate is 30 grams, no nodules, non-tender, with normal sphincter tone. Skin: No rashes, bruises or suspicious lesions. Neurologic: Grossly intact, no focal deficits, moving all 4 extremities. Psychiatric: Normal mood and affect.  Laboratory Data:  Lab Results  Component Value Date   CREATININE 0.80 09/05/2020   I have reviewed the labs.   Pertinent Imaging:  CLINICAL DATA:  History of renal cell carcinoma, follow-up  EXAM: CT ABDOMEN AND PELVIS WITH CONTRAST  TECHNIQUE: Multidetector CT imaging of the abdomen and pelvis was performed using the standard protocol following bolus administration of intravenous contrast.  CONTRAST:  169mL OMNIPAQUE IOHEXOL 300 MG/ML  SOLN  COMPARISON:  Multiple priors including CT abdomen pelvis July 20, 2019  FINDINGS: Lower chest: No acute abnormality.  Hepatobiliary: No suspicious hepatic lesion. Gallbladder is unremarkable. No biliary ductal dilation.  Pancreas: Within normal limits.  Spleen: Within normal limits.  Adrenals/Urinary Tract: Adrenal glands are  within normal limits.  Stable changes of partial nephrectomy in the anterior right lower kidney without suspicious enhancing soft tissue nodularity in the surgical bed. Changes of prior cryoablation in the posterior right lower kidney without residual enhancement in the treatment bed to suggest viable tumor. No hydronephrosis. Left kidney is unremarkable.  Urinary bladder is grossly unremarkable for degree of distension.  Stomach/Bowel: Stomach is within normal limits. Appendix appears normal. Colonic diverticulosis without findings of acute diverticulitis. No evidence of bowel wall thickening, distention, or inflammatory changes.  Vascular/Lymphatic: Aortic atherosclerosis without aneurysmal dilation. No pathologically enlarged abdominal or pelvic lymph nodes.  Reproductive: Prostate is unremarkable.  Other: No abdominopelvic ascites.  Musculoskeletal: Again seen is the high density sclerotic lesion in the right iliac bone stable dating back to at least 2016 likely a bone island. No aggressive lytic or blastic lesions of bone.  IMPRESSION: 1. Changes of prior right lower pole partial nephrectomy and cryoablation without evidence of local recurrence or abdominopelvic metastases. 2. Colonic diverticulosis without findings of acute diverticulitis. 3. Aortic atherosclerosis.  Aortic Atherosclerosis (ICD10-I70.0).   Electronically Signed   By: Dahlia Bailiff MD   On: 09/05/2020 12:29   I have personally reviewed the images and agree with radiologist interpretation.    Assessment & Plan:    1. Bilateral renal cell carcinoma Personal history of renal cell carcinoma x 2   NED on imaging today  Because he's had multifocal disease, it's reasonal to continue with annual cross sectional imaging, and follow up in 1 year with CT abdomen pelvis.  2. BPH with obstruction and LUTS Symptoms improving on Flomax and Finasteride, continue these medications.  3. Elevated  PSA Significant history as outlined above status post MRI fusion followed by fusion biopsy, which was negative.  PSA is appropriately trending downward on Finasteride, has stabilized which is also reassuring  We will continue to follow with serial imaging and rectal exam, PSA, and DRE.   Follow Up:  Annual follow up with IPSS, DRE, and PSA along  with CT abd/ pelvis   I, Ardyth Gal, am acting as a scribe for Dr. [name].   I have reviewed the above documentation for accuracy and completeness, and I agree with the above.   [name], MD   [facility] [address] [place], [place] [address] [phone]

## 2020-09-07 ENCOUNTER — Ambulatory Visit: Payer: Medicare Other | Admitting: Urology

## 2020-09-07 ENCOUNTER — Other Ambulatory Visit: Payer: Self-pay

## 2020-09-07 VITALS — BP 150/84 | HR 88 | Ht 70.0 in | Wt 205.0 lb

## 2020-09-07 DIAGNOSIS — C641 Malignant neoplasm of right kidney, except renal pelvis: Secondary | ICD-10-CM | POA: Diagnosis not present

## 2020-09-14 NOTE — Patient Instructions (Addendum)
Visit Information  It was a pleasure speaking with you today. Thank you for letting me be part of your clinical team. Please call with any questions or concerns.   Goals Addressed            This Visit's Progress   . Monitor and Manage My Blood Sugar-Diabetes Type 2       Timeframe:  Long-Range Goal Priority:  High Start Date:                             Expected End Date:                       Follow Up Date  12/06/20   - check blood sugar at prescribed times - check blood sugar if I feel it is too high or too low - enter blood sugar readings and medication or insulin into daily log - take the blood sugar log to all doctor visits    Why is this important?    Checking your blood sugar at home helps to keep it from getting very high or very low.   Writing the results in a diary or log helps the doctor know how to care for you.   Your blood sugar log should have the time, date and the results.   Also, write down the amount of insulin or other medicine that you take.   Other information, like what you ate, exercise done and how you were feeling, will also be helpful.     Notes:     . Obtain Eye Exam-Diabetes Type 2       Follow Up Date 12/06/20   - schedule appointment with eye doctor    Why is this important?    Eye check-ups are important when you have diabetes.   Vision loss can be prevented.    Notes:     . Track and Manage My Blood Pressure-Hypertension       Timeframe:  Long-Range Goal Priority:  Medium Start Date:                             Expected End Date:                       Follow Up Date 12/06/20    - check blood pressure 3 times per week - write blood pressure results in a log or diary    Why is this important?    You won't feel high blood pressure, but it can still hurt your blood vessels.   High blood pressure can cause heart or kidney problems. It can also cause a stroke.   Making lifestyle changes like losing a little weight or eating  less salt will help.   Checking your blood pressure at home and at different times of the day can help to control blood pressure.   If the doctor prescribes medicine remember to take it the way the doctor ordered.   Call the office if you cannot afford the medicine or if there are questions about it.     Notes:        The patient verbalized understanding of instructions, educational materials, and care plan provided today and agreed to receive a mailed copy of patient instructions, educational materials, and care plan.   Telephone follow up appointment with pharmacy team member scheduled  for: 6- 8 weekls  Junita Push. Kenton Kingfisher PharmD, BCPS Clinical Pharmacist [phone]  Diabetic Nephropathy  Diabetic nephropathy is kidney disease that is caused by diabetes (diabetes mellitus). Kidneys are organs that filter and clean blood and get rid of body waste products and extra fluid. Diabetes can cause gradual kidney damage over many years. Diabetic nephropathy that continues to get worse can lead to kidney failure. What are the causes? This condition is caused by kidney damage from diabetes that is not well controlled with treatment. Having high blood sugar (glucose) for a long time because of diabetes can damage blood vessels in the kidneys and cause them to thicken and become scarred. Those changes prevent the kidneys from functioning normally. What increases the risk? This condition is more likely to develop in people with diabetes who:  Have had diabetes for many years.  Have high blood pressure.  Have high blood glucose levels over a long period of time.  Have a family history of kidney disease.  Have a history of tobacco use.  Have certain genes that are passed from parent to child (inherited). What are the signs or symptoms? This condition may not cause symptoms at first. If you do have symptoms, they may include:  Swelling of your hands, feet, or ankles.  Weakness.  Poor  appetite.  Nausea.  Confusion.  Tiredness (fatigue).  Trouble sleeping.  Dry, itchy skin. If nephropathy leads to kidney failure, symptoms may include:  Vomiting.  Shortness of breath.  Jerky movements that you cannot control (seizure).  Coma. How is this diagnosed? It is important to diagnose this condition before symptoms develop. You may be screened for diabetic nephropathy at a routine health care visit. Screening tests may include:  Urine tests. These may be done every year.  Urine collection over a 24-hour period to measure kidney function.  Blood tests to measure blood glucose levels and kidney function.  Regular blood pressure monitoring. If your health care provider suspects diabetic nephropathy, he or she may:  Review your medical history and symptoms.  Do a physical exam.  Do an ultrasound of your kidneys.  Perform a procedure to take a sample of kidney tissue for testing (biopsy). How is this treated? The goal of treatment is to prevent or slow down any damage to your kidneys by managing your diabetes. To do this, it is important to control:  Your blood pressure. ? Your target blood pressure may vary depending on your medical conditions, your age, and other factors. ? To help control blood pressure, you may be prescribed medicines to lower your blood pressure (ACE inhibitors) or to help your body get rid of excess fluid (diuretics).  Your A1c (hemoglobin A1c) level. Generally, the goal of treatment is to maintain an A1c level of less than 7%.  Your blood glucose level.  Your blood lipids. If you have high cholesterol, you may need to take lipid-lowering drugs, such as statins. Other treatments may include:  Medicines, including insulin injections.  Lifestyle changes, such as losing weight, quitting smoking, or making changes to your diet. If your disease progresses to end-stage kidney failure, treatment may include:  Dialysis. This is a procedure  to filter your blood with a machine.  Kidney transplant. Follow these instructions at home: Eating and drinking  Eat healthy foods, and eat healthy snacks between meals. Follow instructions from your health care provider about eating and drinking restrictions.  Limit your sodium (salt), protein, or fluid intake as directed.  If you drink  alcohol: ? Limit how much you use to:  0-1 drink a day for nonpregnant women.  0-2 drinks a day for men. ? Be aware of how much alcohol is in your drink. In the [place] one drink equals one 12 oz bottle of beer (355 mL), one 5 oz glass of wine (148 mL), or one 1 oz glass of hard liquor (44 mL). Lifestyle  Maintain a healthy weight. Work with your health care provider to lose weight, if needed.  Do not use any products that contain nicotine or tobacco, such as cigarettes, e-cigarettes, and chewing tobacco. If you need help quitting, ask your health care provider.  Be physically active every day. Ask your health care provider what type of exercise is best for you.  Work with your health care provider to manage your blood pressure. General instructions  Follow your diabetes management plan as directed. ? Check your blood glucose levels as directed by your health care provider. ? Keep your blood glucose in your target range as directed by your health care provider. ? Have your A1c level checked two or more times a year, or as often as told by your health care provider.  Measure your blood pressure regularly at home, as told by your health care provider.  Take over-the-counter and prescription medicines only as told by your health care provider. These include insulin and supplements.  Keep all follow-up visits and routine visits as told by your health care provider. This is important. Make sure you get screening tests as directed.      Where to find more information American Diabetes Association: www.diabetes.org Contact a health care provider  if:  You have trouble keeping your blood glucose in your goal range.  Your blood glucose level is higher than 240 mg/dL (13.3 mmol/L) for 2 days in a row.  You have swelling in your hands, ankles, or feet.  You feel weak, tired, or dizzy.  You have sudden muscle tightening (spasms).  You have nausea or vomiting.  You feel tired all the time. Get help right away if:  You are very sleepy.  You faint.  You have: ? A seizure. ? Severe, painful muscle spasms. ? Shortness of breath. ? Chest pain. Summary  Diabetic nephropathy is kidney disease that is caused by diabetes (diabetes mellitus).  Keep your blood sugar (glucose) in your target range as directed by your health care provider.  Work with your health care provider to manage your blood pressure.  Keep all follow-up visits and routine visits as told by your health care provider. This is important. Make sure you get screening tests as directed. This information is not intended to replace advice given to you by your health care provider. Make sure you discuss any questions you have with your health care provider. Document Revised: 10/05/2018 Document Reviewed: 10/05/2018 Elsevier Patient Education  [place].

## 2020-09-26 ENCOUNTER — Telehealth: Payer: Self-pay

## 2020-09-26 NOTE — Telephone Encounter (Signed)
  Care Management   Follow Up Note   09/26/2020 Name: CAIUS SILBERNAGEL MRN: [id number] DOB: [birth year]   Referred by: Venita Lick, NP Reason for referral : Chronic Care Management (RNCM follow up for chronic disease management and care coordination needs)   An unsuccessful telephone outreach was attempted today. The patient was referred to the case management team for assistance with care management and care coordination.   Follow Up Plan: A HIPPA compliant phone message was left for the patient providing contact information and requesting a return call.    Salvatore Marvel RN, Agricultural consultant Health  [employer] [facility] Mobile: [phone]

## 2020-10-17 ENCOUNTER — Telehealth: Payer: Medicare Other | Admitting: General Practice

## 2020-10-17 ENCOUNTER — Ambulatory Visit (INDEPENDENT_AMBULATORY_CARE_PROVIDER_SITE_OTHER): Payer: Medicare Other

## 2020-10-17 DIAGNOSIS — E1159 Type 2 diabetes mellitus with other circulatory complications: Secondary | ICD-10-CM

## 2020-10-17 DIAGNOSIS — I152 Hypertension secondary to endocrine disorders: Secondary | ICD-10-CM

## 2020-10-17 DIAGNOSIS — E1169 Type 2 diabetes mellitus with other specified complication: Secondary | ICD-10-CM

## 2020-10-17 DIAGNOSIS — M0609 Rheumatoid arthritis without rheumatoid factor, multiple sites: Secondary | ICD-10-CM

## 2020-10-17 DIAGNOSIS — E0821 Diabetes mellitus due to underlying condition with diabetic nephropathy: Secondary | ICD-10-CM

## 2020-10-17 DIAGNOSIS — E785 Hyperlipidemia, unspecified: Secondary | ICD-10-CM

## 2020-10-17 NOTE — Patient Instructions (Signed)
Visit Information  PATIENT GOALS:  Goals Addressed             This Visit's Progress    COMPLETED: Increase water intake       Recommend drinking at least 6-8 glasses of water a day         Patient verbalizes understanding of instructions provided today and agrees to view in [place].   Telephone follow up appointment with care management team member scheduled for:01-16-2021 at 10:45am  Salvatore Marvel RN, [facility] Mobile: [phone]

## 2020-10-17 NOTE — Chronic Care Management (AMB) (Signed)
Chronic Care Management   CCM RN Visit Note  10/17/2020 Name: Jason Contreras MRN: [id number] DOB: February 02, 1941  Subjective: Jason Contreras is a 80 y.o. year old male who is a primary care patient of [name], [name], NP. The care management team was consulted for assistance with disease management and care coordination needs.    Engaged with patient by telephone for follow up visit in response to provider referral for case management and/or care coordination services.   Consent to Services:  The patient was given information about Chronic Care Management services, agreed to services, and gave verbal consent prior to initiation of services.  Please see initial visit note for detailed documentation.   Patient agreed to services and verbal consent obtained.   Assessment: Review of patient past medical history, allergies, medications, health status, including review of consultants reports, laboratory and other test data, was performed as part of comprehensive evaluation and provision of chronic care management services.   SDOH (Social Determinants of Health) assessments and interventions performed:    CCM Care Plan  Allergies  Allergen Reactions   Morphine Nausea And Vomiting   Morphine And Related Nausea And Vomiting    Outpatient Encounter Medications as of 10/17/2020  Medication Sig   acetaminophen (TYLENOL) 325 MG tablet Take 650 mg by mouth every 6 (six) hours as needed. Takes at bedtime and occasionally in am for back/hip pain   atorvastatin (LIPITOR) 20 MG tablet TAKE 1 TABLET BY MOUTH  DAILY AT 6 PM.   B Complex-C-Folic Acid (SUPER B COMPLEX/FA/VIT C PO) Take 1 tablet by mouth daily. Contains 093 mcg folic acid   carvedilol (COREG) 6.25 MG tablet Take 1 tablet (6.25 mg total) by mouth 2 (two) times daily.   cholecalciferol (VITAMIN D3) 25 MCG (1000 UNIT) tablet Take 2,000 Units by mouth daily.    COVID-19 mRNA vaccine, Pfizer, 30 MCG/0.3ML injection USE AS DIRECTED   DULoxetine  (CYMBALTA) 60 MG capsule Take 1 capsule (60 mg total) by mouth daily.   finasteride (PROSCAR) 5 MG tablet Take 1 tablet (5 mg total) by mouth daily.   folic acid (FOLVITE) 1 MG tablet Take by mouth.   gabapentin (NEURONTIN) 600 MG tablet TAKE 1 TABLET BY MOUTH 3  TIMES DAILY   lidocaine (LIDODERM) 5 % Place 1 patch onto the skin daily. Remove & Discard patch within 12 hours or as directed by MD   metFORMIN (GLUCOPHAGE) 500 MG tablet TAKE 2 TABLETS BY MOUTH  TWICE DAILY   methotrexate (RHEUMATREX) 2.5 MG tablet Take by mouth.   mometasone (ELOCON) 0.1 % cream APPLY  CREAM TOPICALLY ONCE DAILY   tamsulosin (FLOMAX) 0.4 MG CAPS capsule Take 1 capsule (0.4 mg total) by mouth daily.   vitamin C (ASCORBIC ACID) 500 MG tablet Take 500 mg by mouth daily.   No facility-administered encounter medications on file as of 10/17/2020.    Patient Active Problem List   Diagnosis Date Noted   Aortic atherosclerosis ([place]) 05/26/2020   Rheumatoid arthritis of multiple sites with negative rheumatoid factor ([place]) 12/08/2019   B12 deficiency 07/12/2019   Vitamin D deficiency 06/30/2019   Personal history of renal cancer 10/17/2017   Advanced care planning/counseling discussion 01/13/2017   Senile purpura ([place]) 09/26/2016   Elevated PSA 01/07/2015   BPH with obstruction/lower urinary tract symptoms 01/07/2015   DM due to underlying condition with diabetic nephropathy ([place])    Hypertension associated with diabetes ([place])    Hyperlipidemia associated with type 2 diabetes mellitus ([place])  Conditions to be addressed/monitored:HTN, HLD, DMII, and RA-Chronic Pain  Care Plan : RNCM: Diabetes Type 2 (Adult)  Updates made by Inge Rise, RN since 10/17/2020 12:00 AM     Problem: RNCM: Glycemic Management (Diabetes, Type 2)   Priority: Medium     Goal: RNCM: Glycemic Management Optimized   Note:   Objective:  Lab Results  Component Value Date   HGBA1C 7.3 (H) 09/01/2020     Lab Results   Component Value Date   CREATININE 0.80 09/05/2020   CREATININE 0.82 06/02/2020   CREATININE 0.73 (L) 02/28/2020     No results found for: EGFR Current Barriers:  Knowledge Deficits related to basic Diabetes pathophysiology and self care/management Knowledge Deficits related to medications used for management of diabetes Unable to independently manage DM Does not contact provider office for questions/concerns Case Manager Clinical Goal(s):  Collaboration with Venita Lick, NP regarding development and update of comprehensive plan of care as evidenced by provider attestation and co-signature Inter-disciplinary care team collaboration (see longitudinal plan of care) Over the next 120 days, patient will demonstrate improved adherence to prescribed treatment plan for diabetes self care/management as evidenced by:  daily monitoring and recording of CBG  adherence to ADA/ carb modified diet exercise 3/4 days/week adherence to prescribed medication regimen Interventions:  Provided education to patient about basic DM disease process.  07-12-2020: Review of heart healthy/ADA diet, medications compliance and overall DM health. The patient states he feels great.  Reviewed medications with patient and discussed importance of medication adherence. 07-12-2020: The patient ask about getting new script for his metformin 1069m tablets instead of 2- 500 mg tablets.  He wanted to know why his wife had the 10021mtablet and he had the 500 mg x 2.  Education and support given. Education on not getting the 500 mg tablets mixed up with the 1000 mg tablets. The patient verbalized understanding. Will send in basket message to the pcp about changing script at new refill for 1000 mg tablets to be dispensed. 10-17-2020 After reconsidering, Patient reports he did not want to change Metformin tablet from 500 mg tab to 1000 mg tablet.  He continues to be careful and takes 2 tablets to equal 1000 mg.  Discussed plans with  patient for ongoing care management follow up and provided patient with direct contact information for care management team Provided patient with written educational materials related to hypo and hyperglycemia and importance of correct treatment.  07-12-2020: The patient states that he knows what to look for with changes in his blood sugars. Denies any lows or highs at this time.  Reviewed scheduled/upcoming provider appointments including: 11-27-2020 at 1:00pm Advised patient, providing education and rationale, to check cbg BID and record, calling pcp for findings outside established parameters.  07-12-2020: The patient states that his blood sugars have been good except when he ate the dump cake earlier this week. States he keeps a consistent check on his blood sugars.  He is mindful of highs and lows. 10-17-2020: Patient checks blood glucose every morning with range of 140-170 lately.  Patient states he "eats what he wants to eat". He talked about his age and desiring to eat what he wants at this point in life.  Patient and his wife are going on a trip out of state to grandson's wedding this weekend.  Patient has all his medications.  Reviewed all medications. Need to encourage heart healthy /ADA diet . Review of patient status, including review of consultants reports,  relevant laboratory and other test results, and medications completed. - barriers to adherence to treatment plan identified - blood glucose readings reviewed - resources required to improve adherence to care identified - self-awareness of signs/symptoms of hypo or hyperglycemia encouraged - use of blood glucose monitoring log promoted Patient Goals/Self-Care Activities Over the next 120 days, patient will:  - UNABLE to independently manage DM Checks blood sugars as prescribed and utilize hyper and hypoglycemia protocol as needed Adheres to prescribed ADA/carb modified Follow Up Plan: Telephone follow up appointment with care management team  member scheduled for: 01-16-2021 at 10:45am    Care Plan : RNCM: Hypertension (Adult)  Updates made by Inge Rise, RN since 10/17/2020 12:00 AM     Problem: RNCM: Hypertension (Hypertension)   Priority: Medium     Long-Range Goal: RNCM: Hypertension Monitored   Priority: Medium  Note:   Objective:  Last practice recorded BP readings:  BP Readings from Last 3 Encounters:  09/07/20 (!) 150/84  09/01/20 132/80  06/02/20 124/80    This SmartLink has not been configured with any valid records.     Most recent eGFR/CrCl: No results found for: EGFR  No components found for: CRCL Current Barriers:  Knowledge Deficits related to basic understanding of hypertension pathophysiology and self care management Knowledge Deficits related to understanding of medications prescribed for management of hypertension Unable to independently HTN Unable to self administer medications as prescribed Does not contact provider office for questions/concerns Case Manager Clinical Goal(s):  Over the next 120 days, patient will verbalize understanding of plan for hypertension management Over the next 120 days, patient will attend all scheduled medical appointments: 11-27-2020 at 1:00pm Over the next 120 days, patient will demonstrate improved adherence to prescribed treatment plan for hypertension as evidenced by taking all medications as prescribed, monitoring and recording blood pressure as directed, adhering to low sodium/DASH diet Over the next 120 days, patient will demonstrate improved health management independence as evidenced by checking blood pressure as directed and notifying PCP if SBP>160 or DBP > 90, taking all medications as prescribe, and adhering to a low sodium diet as discussed. Interventions:  Collaboration with Venita Lick, NP regarding development and update of comprehensive plan of care as evidenced by provider attestation and co-signature Inter-disciplinary care team  collaboration (see longitudinal plan of care) Evaluation of current treatment plan related to hypertension self management and patient's adherence to plan as established by provider. Provided education to patient re: stroke prevention, s/s of heart attack and stroke, DASH diet, complications of uncontrolled blood pressure Reviewed medications with patient and discussed importance of compliance Discussed plans with patient for ongoing care management follow up and provided patient with direct contact information for care management team Advised patient, providing education and rationale, to monitor blood pressure daily and record, calling PCP for findings outside established parameters. 07-12-2020: The patient states that he has been having headaches and when he has headaches sometimes he has blood pressures that are elevated but not bad. The patient states he has always had headaches but some days are worse than others. Reminded patient to discuss this with the pcp on next visit for recommendations. 10-17-2020: Patient reports chronic headaches for years.  He uses Tylenol PRN with good relief. Reviewed scheduled/upcoming provider appointments including: 11-27-2020 at 1:00 pm Patient Goals/Self-Care Activities Over the next 120 days, patient will:  - UNABLE to independently management HTN Calls provider office for new concerns, questions, or BP outside discussed parameters Checks BP and records as  discussed Follows a low sodium diet/DASH diet - blood pressure trends reviewed - depression screen reviewed - home or ambulatory blood pressure monitoring encouraged Follow Up Plan: Telephone follow up appointment with care management team member scheduled for: 01-16-2021 at 10:45am    Care Plan : RNCM: Management of HLD  Updates made by Inge Rise, RN since 10/17/2020 12:00 AM     Problem: RNCM: Management of HLD   Priority: Medium     Goal: RNCM:HLD  Self-Management Plan Developed    Priority: Medium  Note:   Current Barriers:  Poorly controlled hyperlipidemia, complicated by DM/HTN Current antihyperlipidemic regimen: Lipitor 20 mg daily  Most recent lipid panel:  Lab Results  Component Value Date   CHOL 138 06/02/2020   CHOL 127 02/28/2020   CHOL 129 10/11/2019   Lab Results  Component Value Date   HDL 44 06/02/2020   HDL 44 02/28/2020   HDL 43 10/11/2019   Lab Results  Component Value Date   LDLCALC 67 06/02/2020   [place] 58 02/28/2020   [place] 64 10/11/2019   Lab Results  Component Value Date   TRIG 155 (H) 06/02/2020   TRIG 142 02/28/2020   TRIG 122 10/11/2019   Lab Results  Component Value Date   CHOLHDL 2.5 03/12/2018   CHOLHDL 2.7 01/13/2017   CHOLHDL 3.6 11/21/2014   No results found for: LDLDIRECT   ASCVD risk enhancing conditions: age >63, DM, HTN Unable to independently manage HLD Does not contact provider office for questions/concerns  RN Care Manager Clinical Goal(s):  Over the next 120 days, patient will work with Consulting civil engineer, providers, and care team towards execution of optimized self-health management plan Over the next 120 days, patient will verbalize understanding of plan for HLD Over the next 120 days, patient will work with [facility] and pcp to address needs related to HLD and effective management  Over the next 120 days, patient will attend all scheduled medical appointments: 06-02-2020 at 3 pm  Interventions: Collaboration with Venita Lick, NP regarding development and update of comprehensive plan of care as evidenced by provider attestation and co-signature Inter-disciplinary care team collaboration (see longitudinal plan of care) Medication review performed; medication list updated in electronic medical record.  Inter-disciplinary care team collaboration (see longitudinal plan of care) Referred to pharmacy team for assistance with HLD medication management Evaluation of current treatment plan related to HLD and  patient's adherence to plan as established by provider. 07-12-2020: The patient denies any new concerns with his HLD. States he is monitoring his dietary intake. Knows to call for changes in conditions or questions. 10-17-2020:  Patient continues to deny any concerns or questions regarding his HLD. Advised patient to call the office for changes in condition or questions concerning chronic conditions  Provided education to patient re: heart healthy/ADA diet and hidden sodium in food content  Discussed plans with patient for ongoing care management follow up and provided patient with direct contact information for care management team Reviewed scheduled/upcoming provider appointments including: 11-27-2020 at 1:00pm  Patient Goals/Self-Care Activities: Over the next 120 days, patient will:   - call for medicine refill 2 or 3 days before it runs out - call if I am sick and can't take my medicine - keep a list of all the medicines I take; vitamins and herbals too - learn to read medicine labels - drink 6 to 8 glasses of water each day - eat 3 to 5 servings of fruits and vegetables each day -  eat 5 or 6 small meals each day - limit fast food meals to no more than 1 per week - manage portion size - prepare main meal at home 3 to 5 days each week - read food labels for fat, fiber, carbohydrates and portion size - be open to making changes - I can manage, know and watch for signs of a heart attack - if I have chest pain, call for help - learn about small changes that will make a big difference - learn my personal risk factors  - barriers to meeting goals identified - choices provided - collaboration with team encouraged - decision-making supported - health risks reviewed - questions answered - resources needed to meet goals identified - self-reflection promoted - self-reliance encouraged  Follow Up Plan: Telephone follow up appointment with care management team member scheduled for:01-16-2021 at  10:45am      Care Plan : RNCM: RA Management  Updates made by Inge Rise, RN since 10/17/2020 12:00 AM     Problem: RNCM: Pain (RA)   Priority: Medium     Long-Range Goal: RNCM: Manage Pain- RA   Priority: Medium  Note:   Current Barriers:  Knowledge Deficits related to managing acute/chronic pain Non-adherence to scheduled provider appointments Non-adherence to prescribed medication regimen Difficulty obtaining medications Chronic Disease Management support and education needs related to chronic pain Lacks social connections Does not contact provider office for questions/concerns Nurse Case Manager Clinical Goal(s):  patient will verbalize understanding of plan for managing pain patient will attend all scheduled medical appointments: 11-27-2020 at 1:00pm patient will demonstrate use of different relaxation  skills and/or diversional activities to assist with pain reduction (distraction, imagery, relaxation, massage, acupressure, TENS, heat, and cold application patient will report pain at a level less than 3 to 4 on a 10-10 rating scale patient will use pharmacological and nonpharmacological pain relief strategies patient will verbalize acceptable level of pain relief and ability to engage in desired activities patient will engage in desired activities without an increase in pain level Interventions:  Collaboration with [name] T, NP regarding development and update of comprehensive plan of care as evidenced by provider attestation and co-signature Inter-disciplinary care team collaboration (see longitudinal plan of care) - deep breathing, relaxation and mindfulness use promoted - medication-induced side effects managed - misuse of pain medication assessed - motivation and barriers to change assessed and addressed - mutually acceptable comfort goal set - pain assessed - pain treatment goals reviewed - premedication prior to activity encouraged Evaluation of  current treatment plan related to RA and patient's adherence to plan as established by provider. 10-17-2020: Patient reports he is doing fairly well today overall with pain control.  Patient states he has chronic headaches for years.  He has mild headache this morning upon waking.  He uses Tylenol PRN with good relief. Advised patient to call the office for new questions or concerns  Provided education to patient re: alternative pain control methods  Reviewed medications with patient and discussed compliance. The patient was taken off of Leflunomide and started on Methotrexate. The patient states the Leflunomide was causing diarrhea. He has not had any issues with the Methotrexate. Will continue to monitor.  Discussed plans with patient for ongoing care management follow up and provided patient with direct contact information for care management team Allow patient to maintain a diary of pain ratings, timing, precipitating events, medications, treatments, and what works best to relieve pain,  Refer to support groups and self-help groups  Educate patient about the use of pharmacological interventions for pain management- antianxiety, antidepressants, NSAIDS, opioid analgesics,  Explain the importance of lifestyle modifications to effective pain management  Patient Goals/Self Care Activities:  - healthy lifestyle promoted - medication side effects managed - multimodal pain management plan developed - pain assessed - response to pain management plan monitored - response to pharmacologic therapy monitored - support and encouragement provided - weight management encouraged Self-administers medications as prescribed Attends all scheduled provider appointments Calls pharmacy for medication refills Calls provider office for new concerns or questions Follow Up Plan: Telephone follow up appointment with care management team member scheduled for: 01-16-2021 at 10:45am       Plan:Telephone follow up  appointment with care management team member scheduled for:  01-16-2021 at 10:45am  Salvatore Marvel RN, [facility] Mobile: [phone]

## 2020-11-14 DIAGNOSIS — M0609 Rheumatoid arthritis without rheumatoid factor, multiple sites: Secondary | ICD-10-CM | POA: Diagnosis not present

## 2020-11-14 DIAGNOSIS — M159 Polyosteoarthritis, unspecified: Secondary | ICD-10-CM | POA: Diagnosis not present

## 2020-11-14 DIAGNOSIS — Z79899 Other long term (current) drug therapy: Secondary | ICD-10-CM | POA: Diagnosis not present

## 2020-11-27 ENCOUNTER — Other Ambulatory Visit: Payer: Self-pay

## 2020-11-27 ENCOUNTER — Encounter: Payer: Self-pay | Admitting: Nurse Practitioner

## 2020-11-27 ENCOUNTER — Ambulatory Visit (INDEPENDENT_AMBULATORY_CARE_PROVIDER_SITE_OTHER): Payer: Medicare Other | Admitting: Nurse Practitioner

## 2020-11-27 VITALS — BP 132/82 | HR 82 | Temp 98.7°F | Wt 203.2 lb

## 2020-11-27 DIAGNOSIS — M0609 Rheumatoid arthritis without rheumatoid factor, multiple sites: Secondary | ICD-10-CM | POA: Diagnosis not present

## 2020-11-27 DIAGNOSIS — N138 Other obstructive and reflux uropathy: Secondary | ICD-10-CM

## 2020-11-27 DIAGNOSIS — D692 Other nonthrombocytopenic purpura: Secondary | ICD-10-CM

## 2020-11-27 DIAGNOSIS — E1169 Type 2 diabetes mellitus with other specified complication: Secondary | ICD-10-CM

## 2020-11-27 DIAGNOSIS — I152 Hypertension secondary to endocrine disorders: Secondary | ICD-10-CM

## 2020-11-27 DIAGNOSIS — E0821 Diabetes mellitus due to underlying condition with diabetic nephropathy: Secondary | ICD-10-CM | POA: Diagnosis not present

## 2020-11-27 DIAGNOSIS — N401 Enlarged prostate with lower urinary tract symptoms: Secondary | ICD-10-CM

## 2020-11-27 DIAGNOSIS — R972 Elevated prostate specific antigen [PSA]: Secondary | ICD-10-CM

## 2020-11-27 DIAGNOSIS — E785 Hyperlipidemia, unspecified: Secondary | ICD-10-CM

## 2020-11-27 DIAGNOSIS — E1159 Type 2 diabetes mellitus with other circulatory complications: Secondary | ICD-10-CM | POA: Diagnosis not present

## 2020-11-27 LAB — BAYER DCA HB A1C WAIVED: HB A1C (BAYER DCA - WAIVED): 7.8 % — ABNORMAL HIGH (ref <7.0)

## 2020-11-27 MED ORDER — FOLIC ACID 1 MG PO TABS: 1.0000 mg | ORAL_TABLET | Freq: Every day | ORAL | 4 refills | Status: DC

## 2020-11-27 NOTE — Patient Instructions (Signed)
Diabetes Mellitus and Nutrition, Adult When you have diabetes, or diabetes mellitus, it is very important to have healthy eating habits because your blood sugar (glucose) levels are greatly affected by what you eat and drink. Eating healthy foods in the right amounts, at about the same times every day, can help you:  Control your blood glucose.  Lower your risk of heart disease.  Improve your blood pressure.  Reach or maintain a healthy weight. What can affect my meal plan? Every person with diabetes is different, and each person has different needs for a meal plan. Your health care provider may recommend that you work with a dietitian to make a meal plan that is best for you. Your meal plan may vary depending on factors such as:  The calories you need.  The medicines you take.  Your weight.  Your blood glucose, blood pressure, and cholesterol levels.  Your activity level.  Other health conditions you have, such as heart or kidney disease. How do carbohydrates affect me? Carbohydrates, also called carbs, affect your blood glucose level more than any other type of food. Eating carbs naturally raises the amount of glucose in your blood. Carb counting is a method for keeping track of how many carbs you eat. Counting carbs is important to keep your blood glucose at a healthy level, especially if you use insulin or take certain oral diabetes medicines. It is important to know how many carbs you can safely have in each meal. This is different for every person. Your dietitian can help you calculate how many carbs you should have at each meal and for each snack. How does alcohol affect me? Alcohol can cause a sudden decrease in blood glucose (hypoglycemia), especially if you use insulin or take certain oral diabetes medicines. Hypoglycemia can be a life-threatening condition. Symptoms of hypoglycemia, such as sleepiness, dizziness, and confusion, are similar to symptoms of having too much  alcohol.  Do not drink alcohol if: ? Your health care provider tells you not to drink. ? You are pregnant, may be pregnant, or are planning to become pregnant.  If you drink alcohol: ? Do not drink on an empty stomach. ? Limit how much you use to:  0-1 drink a day for women.  0-2 drinks a day for men. ? Be aware of how much alcohol is in your drink. In the [place] one drink equals one 12 oz bottle of beer (355 mL), one 5 oz glass of wine (148 mL), or one 1 oz glass of hard liquor (44 mL). ? Keep yourself hydrated with water, diet soda, or unsweetened iced tea.  Keep in mind that regular soda, juice, and other mixers may contain a lot of sugar and must be counted as carbs. What are tips for following this plan? Reading food labels  Start by checking the serving size on the "Nutrition Facts" label of packaged foods and drinks. The amount of calories, carbs, fats, and other nutrients listed on the label is based on one serving of the item. Many items contain more than one serving per package.  Check the total grams (g) of carbs in one serving. You can calculate the number of servings of carbs in one serving by dividing the total carbs by 15. For example, if a food has 30 g of total carbs per serving, it would be equal to 2 servings of carbs.  Check the number of grams (g) of saturated fats and trans fats in one serving. Choose foods that have   a low amount or none of these fats.  Check the number of milligrams (mg) of salt (sodium) in one serving. Most people should limit total sodium intake to less than 2,300 mg per day.  Always check the nutrition information of foods labeled as "low-fat" or "nonfat." These foods may be higher in added sugar or refined carbs and should be avoided.  Talk to your dietitian to identify your daily goals for nutrients listed on the label. Shopping  Avoid buying canned, pre-made, or processed foods. These foods tend to be high in fat, sodium, and added  sugar.  Shop around the outside edge of the grocery store. This is where you will most often find fresh fruits and vegetables, bulk grains, fresh meats, and fresh dairy. Cooking  Use low-heat cooking methods, such as baking, instead of high-heat cooking methods like deep frying.  Cook using healthy oils, such as olive, canola, or sunflower oil.  Avoid cooking with butter, cream, or high-fat meats. Meal planning  Eat meals and snacks regularly, preferably at the same times every day. Avoid going long periods of time without eating.  Eat foods that are high in fiber, such as fresh fruits, vegetables, beans, and whole grains. Talk with your dietitian about how many servings of carbs you can eat at each meal.  Eat 4-6 oz (112-168 g) of lean protein each day, such as lean meat, chicken, fish, eggs, or tofu. One ounce (oz) of lean protein is equal to: ? 1 oz (28 g) of meat, chicken, or fish. ? 1 egg. ?  cup (62 g) of tofu.  Eat some foods each day that contain healthy fats, such as avocado, nuts, seeds, and fish.   What foods should I eat? Fruits Berries. Apples. Oranges. Peaches. Apricots. Plums. Grapes. Mango. Papaya. Pomegranate. Kiwi. Cherries. Vegetables Lettuce. Spinach. Leafy greens, including kale, chard, collard greens, and mustard greens. Beets. Cauliflower. Cabbage. Broccoli. Carrots. Green beans. Tomatoes. Peppers. Onions. Cucumbers. Brussels sprouts. Grains Whole grains, such as whole-wheat or whole-grain bread, crackers, tortillas, cereal, and pasta. Unsweetened oatmeal. Quinoa. Brown or wild rice. Meats and other proteins Seafood. Poultry without skin. Lean cuts of poultry and beef. Tofu. Nuts. Seeds. Dairy Low-fat or fat-free dairy products such as milk, yogurt, and cheese. The items listed above may not be a complete list of foods and beverages you can eat. Contact a dietitian for more information. What foods should I avoid? Fruits Fruits canned with  syrup. Vegetables Canned vegetables. Frozen vegetables with butter or cream sauce. Grains Refined white flour and flour products such as bread, pasta, snack foods, and cereals. Avoid all processed foods. Meats and other proteins Fatty cuts of meat. Poultry with skin. Breaded or fried meats. Processed meat. Avoid saturated fats. Dairy Full-fat yogurt, cheese, or milk. Beverages Sweetened drinks, such as soda or iced tea. The items listed above may not be a complete list of foods and beverages you should avoid. Contact a dietitian for more information. Questions to ask a health care provider  Do I need to meet with a diabetes educator?  Do I need to meet with a dietitian?  What number can I call if I have questions?  When are the best times to check my blood glucose? Where to find more information:  American Diabetes Association: diabetes.org  Academy of Nutrition and Dietetics: www.eatright.org  National Institute of Diabetes and Digestive and Kidney Diseases: www.niddk.nih.gov  Association of Diabetes Care and Education Specialists: www.diabeteseducator.org Summary  It is important to have healthy eating   habits because your blood sugar (glucose) levels are greatly affected by what you eat and drink.  A healthy meal plan will help you control your blood glucose and maintain a healthy lifestyle.  Your health care provider may recommend that you work with a dietitian to make a meal plan that is best for you.  Keep in mind that carbohydrates (carbs) and alcohol have immediate effects on your blood glucose levels. It is important to count carbs and to use alcohol carefully. This information is not intended to replace advice given to you by your health care provider. Make sure you discuss any questions you have with your health care provider. Document Revised: 03/30/2019 Document Reviewed: 03/30/2019 Elsevier Patient Education  2021 Elsevier Inc.  

## 2020-11-27 NOTE — Assessment & Plan Note (Signed)
Chronic, ongoing.  Continue current medication regimen and collaboration with urology.

## 2020-11-27 NOTE — Assessment & Plan Note (Signed)
Continue collaboration with urology.  Recent note reviewed.  PSA monitored by them.   

## 2020-11-27 NOTE — Assessment & Plan Note (Signed)
Chronic, stable with BP at goal today in office today.  Recommend he monitor BP at home at least three mornings a week + focus on DASH diet.  Continue current medication regimen and adjust as needed.  Labs: BMP today.  Return in 3 months.

## 2020-11-27 NOTE — Assessment & Plan Note (Signed)
Chronic, ongoing.  Followed by rheumatology with much improved functional status since initially meeting this patient one year ago.  Continue this collaboration and current regimen as prescribed by them.  Recent note reviewed.

## 2020-11-27 NOTE — Assessment & Plan Note (Signed)
Chronic, ongoing.  Continue current medication regimen and adjust as needed. Lipid panel today. 

## 2020-11-27 NOTE — Assessment & Plan Note (Signed)
Chronic, ongoing in patient with history of renal cell CA.  A1C today 7.8%, slight trend up due to dietary indiscretions.  Continue current medication regimen and adjust as needed -- discussed addition of Trulicity which his wife also takes, but he wishes to hold off and focus on diet changes,  Monitor BS daily in morning at home.  Continue Metformin, consider use of alternate options such as GLP if elevations present next visit, due to his age and risk for hypoglycemia. Could trial SGLT2, but with urinary symptoms may not tolerate.  Return to office in 3 months.

## 2020-11-27 NOTE — Progress Notes (Signed)
BP 132/82   Pulse 82   Temp 98.7 F (37.1 C) (Oral)   Wt 203 lb 3.2 oz (92.2 kg)   SpO2 97%   BMI 29.16 kg/m    Subjective:    Patient ID: Jason Contreras, male    DOB: 06-26-1940, 80 y.o.   MRN: [id number]  HPI: Jason Contreras is a 80 y.o. male  Chief Complaint  Patient presents with   Diabetes   Hypertension   Hyperlipidemia   DIABETES Taking Metformin 1000 MG BID, discontinued Actos and Amaryl over past year  Previous A1C in April 7.3%.   Hypoglycemic episodes:no Polydipsia/polyuria: no Visual disturbance: no Chest pain: no Paresthesias: no Glucose Monitoring: yes             Accucheck frequency: not checking             Fasting glucose:              Post prandial:             Evening:             Before meals: Taking Insulin?: no             Long acting insulin:             Short acting insulin: Blood Pressure Monitoring: daily Retinal Examination: Not up to Date Foot Exam: Up to Date Pneumovax: Not up to Date Influenza: Up to Date Aspirin: no    HYPERTENSION / HYPERLIPIDEMIA Continues on Coreg 6.25 MG BID + Atorvastatin 20 MG daily.  Satisfied with current treatment? yes Duration of hypertension: chronic BP monitoring frequency: daily BP range: <130/80 range BP medication side effects: no Duration of hyperlipidemia: chronic Cholesterol medication side effects: no Cholesterol supplements: none Medication compliance: good compliance Aspirin: no Recent stressors: no Recurrent headaches: no Visual changes: no Palpitations: no Dyspnea: no Chest pain: no Lower extremity edema: no Dizzy/lightheaded: no    RHEUMATOID ARTHRITIS He is currently continuing to be followed by rheumatology for RA of multiple sites with negative Rf.  Last visit 11/14/20.  Taking Methotrexate and Duloxetine + Folic Acid. Pain control status: stable Duration: chronic Location: multiple areas What Activities task can be accomplished with current medication? ADL's  daily Previous pain specialty evaluation: yes Non-narcotic analgesic meds: yes   ELEVATED PSA: Being followed by urology.  Last PSA 4.0 and last visit with urology on 09/07/20.  To continue surveillance. Has history of kidney cancer to right side, had two operations on this.  Continues on Finasteride and Tamsulosin.   Relevant past medical, surgical, family and social history reviewed and updated as indicated. Interim medical history since our last visit reviewed. Allergies and medications reviewed and updated.  Review of Systems  Constitutional:  Negative for activity change, diaphoresis, fatigue and fever.  Respiratory:  Negative for cough, chest tightness, shortness of breath and wheezing.   Cardiovascular:  Negative for chest pain, palpitations and leg swelling.  Gastrointestinal: Negative.   Endocrine: Negative for polydipsia, polyphagia and polyuria.  Neurological:  Negative for dizziness, syncope, weakness and numbness.  Psychiatric/Behavioral: Negative.     Per HPI unless specifically indicated above     Objective:    BP 132/82   Pulse 82   Temp 98.7 F (37.1 C) (Oral)   Wt 203 lb 3.2 oz (92.2 kg)   SpO2 97%   BMI 29.16 kg/m   Wt Readings from Last 3 Encounters:  11/27/20 203 lb 3.2 oz (92.2 kg)  09/07/20  205 lb (93 kg)  09/01/20 205 lb 3.2 oz (93.1 kg)    Physical Exam Vitals and nursing note reviewed.  Constitutional:      General: He is awake.     Appearance: He is well-developed and overweight.  HENT:     Head: Normocephalic and atraumatic.     Right Ear: Hearing normal. No drainage.     Left Ear: Hearing normal. No drainage.  Eyes:     General: Lids are normal.        Right eye: No discharge.        Left eye: No discharge.     Conjunctiva/sclera: Conjunctivae normal.     Pupils: Pupils are equal, round, and reactive to light.  Neck:     Thyroid: No thyromegaly.     Vascular: No carotid bruit or JVD.     Trachea: Trachea normal.  Cardiovascular:      Rate and Rhythm: Normal rate and regular rhythm.     Heart sounds: Normal heart sounds, S1 normal and S2 normal. No murmur heard.   No gallop.  Pulmonary:     Effort: Pulmonary effort is normal. No accessory muscle usage or respiratory distress.     Breath sounds: Normal breath sounds.  Abdominal:     General: Bowel sounds are normal.     Palpations: Abdomen is soft.  Musculoskeletal:        General: Normal range of motion.     Cervical back: Normal range of motion and neck supple.     Right lower leg: No edema.     Left lower leg: No edema.  Skin:    General: Skin is warm and dry.     Capillary Refill: Capillary refill takes less than 2 seconds.     Comments: Scattered pale purple/yellow bruises noted to BUE.  Neurological:     Mental Status: He is alert and oriented to person, place, and time.     Cranial Nerves: Cranial nerves are intact.     Coordination: Coordination is intact.     Gait: Gait is intact.     Deep Tendon Reflexes: Reflexes are normal and symmetric.     Reflex Scores:      Brachioradialis reflexes are 2+ on the right side and 2+ on the left side.      Patellar reflexes are 2+ on the right side and 2+ on the left side. Psychiatric:        Mood and Affect: Mood normal.        Behavior: Behavior normal. Behavior is cooperative.        Thought Content: Thought content normal.        Judgment: Judgment normal.    Results for orders placed or performed during the hospital encounter of 09/05/20  I-STAT creatinine  Result Value Ref Range   Creatinine, Ser 0.80 0.61 - 1.24 mg/dL      Assessment & Plan:   Problem List Items Addressed This Visit       Cardiovascular and Mediastinum   Hypertension associated with diabetes ([place])    Chronic, stable with BP at goal today in office today.  Recommend he monitor BP at home at least three mornings a week + focus on DASH diet.  Continue current medication regimen and adjust as needed.  Labs: BMP today.  Return in 3  months.       Relevant Orders   Bayer DCA Hb A1c Waived   Basic metabolic panel   Senile purpura (  HCC)    Chronic, stable.  Recommend continue to perform gentle skin care and monitor for skin breakdown, if present notify provider.         Endocrine   DM due to underlying condition with diabetic nephropathy ([place]) - Primary    Chronic, ongoing in patient with history of renal cell CA.  A1C today 7.8%, slight trend up due to dietary indiscretions.  Continue current medication regimen and adjust as needed -- discussed addition of Trulicity which his wife also takes, but he wishes to hold off and focus on diet changes,  Monitor BS daily in morning at home.  Continue Metformin, consider use of alternate options such as GLP if elevations present next visit, due to his age and risk for hypoglycemia. Could trial SGLT2, but with urinary symptoms may not tolerate.  Return to office in 3 months.       Relevant Orders   Bayer DCA Hb A1c Waived   Hyperlipidemia associated with type 2 diabetes mellitus (HCC)    Chronic, ongoing.  Continue current medication regimen and adjust as needed.  Lipid panel today.       Relevant Orders   Bayer DCA Hb A1c Waived   Lipid Panel w/o Chol/HDL Ratio     Musculoskeletal and Integument   Rheumatoid arthritis of multiple sites with negative rheumatoid factor (HCC)    Chronic, ongoing.  Followed by rheumatology with much improved functional status since initially meeting this patient one year ago.  Continue this collaboration and current regimen as prescribed by them.  Recent note reviewed.         Genitourinary   BPH with obstruction/lower urinary tract symptoms    Chronic, ongoing.  Continue current medication regimen and collaboration with urology.           Other   Elevated PSA    Continue collaboration with urology.  Recent note reviewed.  PSA monitored by them.         Follow up plan: Return in about 3 months (around 02/27/2021) for T2DM,  HTN/HLD, VIT D, B12, RA.

## 2020-11-27 NOTE — Assessment & Plan Note (Signed)
Chronic, stable.  Recommend continue to perform gentle skin care and monitor for skin breakdown, if present notify provider.

## 2020-11-28 ENCOUNTER — Telehealth: Payer: Self-pay

## 2020-11-28 LAB — LIPID PANEL W/O CHOL/HDL RATIO
Cholesterol, Total: 104 mg/dL (ref 100–199)
HDL: 40 mg/dL
LDL Chol Calc (NIH): 42 mg/dL (ref 0–99)
Triglycerides: 126 mg/dL (ref 0–149)
VLDL Cholesterol Cal: 22 mg/dL (ref 5–40)

## 2020-11-28 LAB — BASIC METABOLIC PANEL
BUN/Creatinine Ratio: 14 (ref 10–24)
BUN: 12 mg/dL (ref 8–27)
CO2: 26 mmol/L (ref 20–29)
Calcium: 9.2 mg/dL (ref 8.6–10.2)
Chloride: 100 mmol/L (ref 96–106)
Creatinine, Ser: 0.88 mg/dL (ref 0.76–1.27)
Glucose: 209 mg/dL — ABNORMAL HIGH (ref 65–99)
Potassium: 4.8 mmol/L (ref 3.5–5.2)
Sodium: 140 mmol/L (ref 134–144)
eGFR: 89 mL/min/{1.73_m2} (ref >59)

## 2020-11-28 NOTE — Progress Notes (Signed)
Contacted via [place] afternoon Davante, your labs have returned.  All looks fantastic with exception of that glucose which is in 200 range, that is to be expected with the increase in your A1c.  Continue focus on diet and we will see you next visit.  I love you two!!  You guys are my future goal for my husband and I. Keep being awesome!!  Thank you for allowing me to participate in your care.  I appreciate you. Kindest regards, Shweta Aman

## 2020-11-28 NOTE — Chronic Care Management (AMB) (Signed)
Error

## 2020-11-29 ENCOUNTER — Telehealth: Payer: Self-pay

## 2020-12-01 ENCOUNTER — Telehealth: Payer: Self-pay

## 2020-12-01 NOTE — Chronic Care Management (AMB) (Signed)
Unable to reach patient x3, CCM team will contact patient next month.  [place] Pharmacist Assistant [phone]

## 2020-12-01 NOTE — Progress Notes (Signed)
Called patient x3 w/ no reply. CCM team will call patient next month.

## 2020-12-05 ENCOUNTER — Telehealth: Payer: Self-pay

## 2020-12-05 NOTE — Progress Notes (Deleted)
Chronic Care Management Pharmacy Note  12/05/2020 Name:  Jason Contreras MRN:  [id number] DOB:  07/04/1940  Summary:  Recommendations/Changes made from today's visit:  Plan:  Subjective: Jason Contreras is an 80 y.o. year old male who is a primary patient of [name], [name], NP.  The CCM team was consulted for assistance with disease management and care coordination needs.    {CCMTELEPHONEFACETOFACE:21091510} for {CCMINITIALFOLLOWUPCHOICE:21091511} in response to provider referral for pharmacy case management and/or care coordination services.   Consent to Services:  {CCMCONSENTOPTIONS:25074}  Patient Care Team: Venita Lick, NP as PCP - General (Nurse Practitioner) [name], MD as Consulting Physician (Urology) [name], MD as Consulting Physician (Interventional Radiology) Vanita Ingles, RN as Case Manager ([place]) Vladimir Faster, [facility] as Pharmacist (Pharmacist)  Recent office visits: ***  Recent consult visits: [facility] visits: {Hospital DC Yes/No:21091515}  Objective:  Lab Results  Component Value Date   CREATININE 0.88 11/27/2020   CREATININE 0.80 09/05/2020   CREATININE 0.82 06/02/2020    Lab Results  Component Value Date   HGBA1C 7.8 (H) 11/27/2020   Last diabetic Eye exam:  Lab Results  Component Value Date/Time   HMDIABEYEEXA No Retinopathy 05/03/2016 01:55 PM    Last diabetic Foot exam: No results found for: HMDIABFOOTEX      Component Value Date/Time   CHOL 104 11/27/2020 1332   CHOL 101 10/13/2018 0901   TRIG 126 11/27/2020 1332   TRIG 91 10/13/2018 0901   HDL 40 11/27/2020 1332   CHOLHDL 2.5 03/12/2018 1356   VLDL 18 10/13/2018 0901   LDLCALC 42 11/27/2020 1332    Hepatic Function Latest Ref Rng & Units 06/02/2020 05/28/2019 10/13/2018  Total Protein 6.0 - 8.5 g/dL 6.1 6.5 -  Albumin 3.7 - 4.7 g/dL 4.1 4.0 -  AST 0 - 40 IU/L '21 13 16  ' ALT 0 - 44 IU/L '21 8 19  ' Alk Phosphatase 44 - 121 IU/L 68 99 -   Total Bilirubin 0.0 - 1.2 mg/dL 0.5 0.5 -    Lab Results  Component Value Date/Time   TSH 1.470 06/02/2020 02:54 PM   TSH 3.410 05/28/2019 11:47 AM    CBC Latest Ref Rng & Units 06/02/2020 07/12/2019 05/28/2019  WBC 3.4 - 10.8 x10E3/uL 6.4 7.3 5.1  Hemoglobin 13.0 - 17.7 g/dL 13.8 13.0 12.8(L)  Hematocrit 37.5 - 51.0 % 42.1 39.1 38.7  Platelets 150 - 450 x10E3/uL 243 287 308    Lab Results  Component Value Date/Time   VD25OH 32.6 09/01/2020 03:25 PM   VD25OH 28.6 (L) 06/02/2020 02:54 PM    Clinical ASCVD: {YES/NO:21197} The ASCVD Risk score Mikey Bussing DC Jr., et al., 2013) failed to calculate for the following reasons:   The valid total cholesterol range is 130 to 320 mg/dL    Other: (CHADS2VASc if Afib, PHQ9 if depression, MMRC or CAT for COPD, ACT, DEXA)  Social History   Tobacco Use  Smoking Status Never  Smokeless Tobacco Never   BP Readings from Last 3 Encounters:  11/27/20 132/82  09/07/20 (!) 150/84  09/01/20 132/80   Pulse Readings from Last 3 Encounters:  11/27/20 82  09/07/20 88  09/01/20 78   Wt Readings from Last 3 Encounters:  11/27/20 203 lb 3.2 oz (92.2 kg)  09/07/20 205 lb (93 kg)  09/01/20 205 lb 3.2 oz (93.1 kg)    Assessment: Review of patient past medical history, allergies, medications, health status, including review of consultants reports, laboratory  and other test data, was performed as part of comprehensive evaluation and provision of chronic care management services.   SDOH:  (Social Determinants of Health) assessments and interventions performed:    CCM Care Plan  Allergies  Allergen Reactions   Morphine Nausea And Vomiting   Morphine And Related Nausea And Vomiting    Medications Reviewed Today     Reviewed by Venita Lick, NP (Nurse Practitioner) on 11/27/20 at 1351  Med List Status: <None>   Medication Order Taking? Sig Documenting Provider Last Dose Status Informant  acetaminophen (TYLENOL) 325 MG tablet [id number] Yes  Take 650 mg by mouth every 6 (six) hours as needed. Takes at bedtime and occasionally in am for back/hip pain [provider] Taking Active Self  atorvastatin (LIPITOR) 20 MG tablet [id number] Yes TAKE 1 TABLET BY MOUTH  DAILY AT 6 PM. [name], Jolene T, NP Taking Active   B Complex-C-Folic Acid (SUPER B COMPLEX/FA/VIT C PO) [id number] Yes Take 1 tablet by mouth daily. Contains 086 mcg folic acid [provider] Taking Active Self  carvedilol (COREG) 6.25 MG tablet [id number] Yes Take 1 tablet (6.25 mg total) by mouth 2 (two) times daily. [name] T, NP Taking Active   cholecalciferol (VITAMIN D3) 25 MCG (1000 UNIT) tablet [id number] Yes Take 2,000 Units by mouth daily.  [provider] Taking Active   COVID-19 mRNA vaccine, Pfizer, 30 MCG/0.3ML injection [id number] Yes USE AS DIRECTED Carlyle Basques, MD Taking Active   DULoxetine (CYMBALTA) 60 MG capsule [id number] Yes Take 1 capsule (60 mg total) by mouth daily. [name] T, NP Taking Active   finasteride (PROSCAR) 5 MG tablet [id number] Yes Take 1 tablet (5 mg total) by mouth daily. [name], MD Taking Active   folic acid (FOLVITE) 1 MG tablet [id number]  Take 1 tablet (1 mg total) by mouth daily. [name] T, NP  Active   gabapentin (NEURONTIN) 600 MG tablet [id number] Yes TAKE 1 TABLET BY MOUTH 3  TIMES DAILY [name], Jolene T, NP Taking Active   lidocaine (LIDODERM) 5 % [id number] Yes Place 1 patch onto the skin daily. Remove & Discard patch within 12 hours or as directed by MD Venita Lick, NP Taking Active            Med Note ([place]   Wed Nov 10, 2019  1:11 PM)    metFORMIN (GLUCOPHAGE) 500 MG tablet [id number] Yes TAKE 2 TABLETS BY MOUTH  TWICE DAILY [name], Jolene T, NP Taking Active   methotrexate (RHEUMATREX) 2.5 MG tablet [id number] Yes Take by mouth. [provider] Taking Active   mometasone (ELOCON) 0.1 % cream [id number] Yes APPLY  CREAM TOPICALLY ONCE DAILY [name],  Jolene T, NP Taking Active   tamsulosin (FLOMAX) 0.4 MG CAPS capsule [id number] Yes Take 1 capsule (0.4 mg total) by mouth daily. [name] T, NP Taking Active   vitamin C (ASCORBIC ACID) 500 MG tablet [id number] Yes Take 500 mg by mouth daily. [provider] Taking Active Self            Patient Active Problem List   Diagnosis Date Noted   Aortic atherosclerosis ([place]) 05/26/2020   Rheumatoid arthritis of multiple sites with negative rheumatoid factor ([place]) 12/08/2019   B12 deficiency 07/12/2019   Vitamin D deficiency 06/30/2019   Personal history of renal cancer 10/17/2017   Advanced care planning/counseling discussion 01/13/2017   Senile purpura ([place]) 09/26/2016   Elevated PSA 01/07/2015   BPH with obstruction/lower urinary tract  symptoms 01/07/2015   DM due to underlying condition with diabetic nephropathy ([place])    Hypertension associated with diabetes ([place])    Hyperlipidemia associated with type 2 diabetes mellitus ([place])     Immunization History  Administered Date(s) Administered   Fluad Quad(high Dose 65+) 01/25/2019, 03/13/2020   Influenza, High Dose Seasonal PF 03/25/2016, 01/13/2017, 03/12/2018   Influenza,inj,Quad PF,6+ Mos 03/13/2015   Influenza-Unspecified 04/18/2014   PFIZER(Purple Top)SARS-COV-2 Vaccination 05/12/2019, 06/05/2019, 02/25/2020   Pneumococcal Conjugate-13 10/19/2013   Pneumococcal Polysaccharide-23 09/03/1997, 02/26/2005, 10/10/2019, 10/11/2019   Td 02/17/2008    Conditions to be addressed/monitored: {CCM ASSESSMENT DISEASE OPTIONS:25047}  There are no care plans that you recently modified to display for this patient.   Medication Assistance: {MEDASSISTANCEINFO:25044}  Patient's preferred pharmacy is:  [facility] - [address] [place] [place] [address] Phone: [phone] Fax: [phone]  [facility] - [place], [place] [address] [place] [address] Phone: [phone] Fax: [phone]  Uses pill box? {Yes or If no, why not?:20788} Pt endorses ***% compliance  Follow Up:  {FOLLOWUP:24991}  Plan: {CM FOLLOW UP PLAN:25073}  SIG***

## 2020-12-06 ENCOUNTER — Telehealth: Payer: Self-pay

## 2020-12-14 ENCOUNTER — Other Ambulatory Visit: Payer: Self-pay | Admitting: Nurse Practitioner

## 2020-12-14 DIAGNOSIS — E785 Hyperlipidemia, unspecified: Secondary | ICD-10-CM

## 2020-12-14 DIAGNOSIS — I10 Essential (primary) hypertension: Secondary | ICD-10-CM

## 2020-12-14 NOTE — Telephone Encounter (Signed)
Requested Prescriptions  Pending Prescriptions Disp Refills  . carvedilol (COREG) 6.25 MG tablet [Pharmacy Med Name: Carvedilol 6.25 MG Oral Tablet] 180 tablet 0    Sig: TAKE 1 TABLET BY MOUTH  TWICE DAILY     Cardiovascular:  Beta Blockers Passed - 12/14/2020 12:38 AM      Passed - Last BP in normal range    BP Readings from Last 1 Encounters:  11/27/20 132/82         Passed - Last Heart Rate in normal range    Pulse Readings from Last 1 Encounters:  11/27/20 82         Passed - Valid encounter within last 6 months    Recent Outpatient Visits          2 weeks ago Diabetes mellitus due to underlying condition with diabetic nephropathy, without long-term current use of insulin ([place])   [place], [place] T, NP   3 months ago Diabetes mellitus due to underlying condition with diabetic nephropathy, without long-term current use of insulin ([place])   [place], [place] T, NP   6 months ago Diabetes mellitus due to underlying condition with diabetic nephropathy, without long-term current use of insulin ([place])   [place], Jolene T, NP   9 months ago Diabetes mellitus due to underlying condition with diabetic nephropathy, without long-term current use of insulin ([place])   [place], [place] T, NP   1 year ago Diabetes mellitus due to underlying condition with diabetic nephropathy, without long-term current use of insulin ([place])   [place], [name], NP      Future Appointments            In 1 month  [place], [place]   In 2 months [place], [name], NP [employer] [facility], [place]   In 9 months [name], [facility]

## 2020-12-14 NOTE — Telephone Encounter (Signed)
Requested Prescriptions  Pending Prescriptions Disp Refills  . atorvastatin (LIPITOR) 20 MG tablet [Pharmacy Med Name: Atorvastatin Calcium 20 MG Oral Tablet] 90 tablet 3    Sig: TAKE 1 TABLET BY MOUTH  DAILY AT 6 PM.     Cardiovascular:  Antilipid - Statins Passed - 12/14/2020  4:17 AM      Passed - Total Cholesterol in normal range and within 360 days    Cholesterol, Total  Date Value Ref Range Status  11/27/2020 104 100 - 199 mg/dL Final   Cholesterol Piccolo, Waived  Date Value Ref Range Status  10/13/2018 101 <200 mg/dL Final    Comment:                            Desirable                <200                         Borderline High      200- 239                         High                     >239          Passed - LDL in normal range and within 360 days    LDL Chol Calc (NIH)  Date Value Ref Range Status  11/27/2020 42 0 - 99 mg/dL Final         Passed - HDL in normal range and within 360 days    HDL  Date Value Ref Range Status  11/27/2020 40 >39 mg/dL Final         Passed - Triglycerides in normal range and within 360 days    Triglycerides  Date Value Ref Range Status  11/27/2020 126 0 - 149 mg/dL Final   Triglycerides Piccolo,Waived  Date Value Ref Range Status  10/13/2018 91 <150 mg/dL Final    Comment:                            Normal                   <150                         Borderline High     150 - 199                         High                200 - 499                         Very High                >499          Passed - Patient is not pregnant      Passed - Valid encounter within last 12 months    Recent Outpatient Visits          2 weeks ago Diabetes mellitus due to underlying condition with diabetic nephropathy, without long-term current use of insulin ([place])   [place], [name], NP  3 months ago Diabetes mellitus due to underlying condition with diabetic nephropathy, without long-term current use of  insulin ([place])   [place], [place] T, NP   6 months ago Diabetes mellitus due to underlying condition with diabetic nephropathy, without long-term current use of insulin ([place])   [place], Jolene T, NP   9 months ago Diabetes mellitus due to underlying condition with diabetic nephropathy, without long-term current use of insulin ([place])   [place], [place] T, NP   1 year ago Diabetes mellitus due to underlying condition with diabetic nephropathy, without long-term current use of insulin ([place])   [place], [name], NP      Future Appointments            In 1 month  [place], [place]   In 2 months [place], [name], NP [facility] [facility], [facility]   In 9 months [name], MD [facility]           . tamsulosin (FLOMAX) 0.4 MG CAPS capsule [Pharmacy Med Name: Tamsulosin HCl 0.4 MG Oral Capsule] 90 capsule 3    Sig: TAKE 1 CAPSULE BY MOUTH  DAILY     Urology: Alpha-Adrenergic Blocker Passed - 12/14/2020  4:17 AM      Passed - Last BP in normal range    BP Readings from Last 1 Encounters:  11/27/20 132/82         Passed - Valid encounter within last 12 months    Recent Outpatient Visits          2 weeks ago Diabetes mellitus due to underlying condition with diabetic nephropathy, without long-term current use of insulin ([place])   [place], [place] T, NP   3 months ago Diabetes mellitus due to underlying condition with diabetic nephropathy, without long-term current use of insulin ([place])   [place], [place] T, NP   6 months ago Diabetes mellitus due to underlying condition with diabetic nephropathy, without long-term current use of insulin ([place])   [place], Jolene T, NP   9 months ago Diabetes mellitus due to underlying condition with diabetic nephropathy, without long-term current use of  insulin ([place])   [place], [place] T, NP   1 year ago Diabetes mellitus due to underlying condition with diabetic nephropathy, without long-term current use of insulin ([place])   [place], [name], NP      Future Appointments            In 1 month  [place], [place]   In 2 months [place], [name], NP [employer] [facility], [place]   In 9 months [name], [facility]

## 2020-12-28 ENCOUNTER — Other Ambulatory Visit: Payer: Self-pay | Admitting: Urology

## 2021-01-04 ENCOUNTER — Telehealth: Payer: Self-pay | Admitting: Nurse Practitioner

## 2021-01-04 NOTE — Telephone Encounter (Signed)
Copied from [place] [phone]. Topic: Medicare AWV >> Jan 04, 2021  4:04 PM Lavonia Drafts wrote: Reason for CRM:   Left message to reschedule AWVs due to [facility] schedule change. This will be by phone not in the office. Please reschedule if patient calls the office back.

## 2021-01-04 NOTE — Telephone Encounter (Signed)
Copied from [place] [phone]. Topic: Medicare AWV >> Jan 04, 2021  4:04 PM Lavonia Drafts wrote: Reason for CRM:   Left message to reschedule AWVs due to [facility] schedule change. This will be by phone not in the office. Please reschedule if patient calls the office back.

## 2021-01-16 ENCOUNTER — Ambulatory Visit (INDEPENDENT_AMBULATORY_CARE_PROVIDER_SITE_OTHER): Payer: Medicare Other

## 2021-01-16 ENCOUNTER — Telehealth: Payer: Medicare Other | Admitting: General Practice

## 2021-01-16 DIAGNOSIS — E1159 Type 2 diabetes mellitus with other circulatory complications: Secondary | ICD-10-CM

## 2021-01-16 DIAGNOSIS — M0609 Rheumatoid arthritis without rheumatoid factor, multiple sites: Secondary | ICD-10-CM

## 2021-01-16 DIAGNOSIS — E785 Hyperlipidemia, unspecified: Secondary | ICD-10-CM

## 2021-01-16 DIAGNOSIS — E0821 Diabetes mellitus due to underlying condition with diabetic nephropathy: Secondary | ICD-10-CM

## 2021-01-16 DIAGNOSIS — E1169 Type 2 diabetes mellitus with other specified complication: Secondary | ICD-10-CM

## 2021-01-16 NOTE — Chronic Care Management (AMB) (Signed)
Chronic Care Management   CCM RN Visit Note  01/16/2021 Name: Jason Contreras MRN: [id number] DOB: February 06, 1941  Subjective: Jason Contreras is a 80 y.o. year old male who is a primary care patient of [name], [name], NP. The care management team was consulted for assistance with disease management and care coordination needs.    Engaged with patient by telephone for follow up visit in response to provider referral for case management and/or care coordination services.   Consent to Services:  The patient was given information about Chronic Care Management services, agreed to services, and gave verbal consent prior to initiation of services.  Please see initial visit note for detailed documentation.   Patient agreed to services and verbal consent obtained.   Assessment: Review of patient past medical history, allergies, medications, health status, including review of consultants reports, laboratory and other test data, was performed as part of comprehensive evaluation and provision of chronic care management services.   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 Takes at bedtime and occasionally in am for back/hip pain    atorvastatin (LIPITOR) 20 MG tablet TAKE 1 TABLET BY MOUTH  DAILY AT 6 PM.   B Complex-C-Folic Acid (SUPER B COMPLEX/FA/VIT C PO) Take 1 tablet by mouth daily. Contains 132 mcg folic acid   carvedilol (COREG) 6.25 MG tablet TAKE 1 TABLET BY MOUTH  TWICE DAILY   cholecalciferol (VITAMIN D3) 25 MCG (1000 UNIT) tablet Take 2,000 Units by mouth daily.    COVID-19 mRNA vaccine, Pfizer, 30 MCG/0.3ML injection USE AS DIRECTED   DULoxetine (CYMBALTA) 60 MG capsule Take 1 capsule (60 mg total) by mouth daily.   finasteride (PROSCAR) 5 MG tablet Take 1 tablet (5 mg total) by mouth daily.   folic acid (FOLVITE) 1 MG tablet Take 1 tablet (1 mg total) by mouth daily.   gabapentin (NEURONTIN) 600 MG tablet TAKE 1 TABLET BY MOUTH 3  TIMES DAILY   lidocaine (LIDODERM) 5 % Place 1 patch onto the skin daily. Remove & Discard patch within 12 hours or as directed by MD   metFORMIN (GLUCOPHAGE) 500 MG tablet TAKE 2 TABLETS BY MOUTH  TWICE DAILY   methotrexate (RHEUMATREX) 2.5 MG tablet Take by mouth.   mometasone (ELOCON) 0.1 % cream APPLY  CREAM TOPICALLY ONCE DAILY   tamsulosin (FLOMAX) 0.4 MG CAPS capsule TAKE 1 CAPSULE BY MOUTH  DAILY   vitamin C (ASCORBIC ACID) 500 MG tablet Take 500 mg by mouth daily.   No facility-administered encounter medications on file as of 01/16/2021.    Patient Active Problem List   Diagnosis Date Noted   Aortic atherosclerosis ([place]) 05/26/2020   Rheumatoid arthritis of multiple sites with negative rheumatoid factor ([place]) 12/08/2019   B12 deficiency 07/12/2019   Vitamin D deficiency  06/30/2019   Personal history of renal cancer 10/17/2017   Advanced care planning/counseling discussion 01/13/2017   Senile purpura ([place]) 09/26/2016   Elevated PSA 01/07/2015   BPH with obstruction/lower urinary tract symptoms 01/07/2015   DM due to underlying condition with diabetic nephropathy ([place])    Hypertension associated with diabetes ([place])    Hyperlipidemia associated with type 2 diabetes  mellitus ([place])     Conditions to be addressed/monitored:HTN, HLD, DMII, and RA  Care Plan : RNCM: Diabetes Type 2 (Adult)  Updates made by Vanita Ingles, RN since 01/16/2021 12:00 AM     Problem: RNCM: Glycemic Management (Diabetes, Type 2)   Priority: High     Long-Range Goal: RNCM: Glycemic Management Optimized   Start Date: 05/17/2020  Expected End Date: 11/02/2021  This Visit's Progress: On track  Priority: High  Note:   Objective:  Lab Results  Component Value Date   HGBA1C 7.8 (H) 11/27/2020     Lab Results  Component Value Date   CREATININE 0.88 11/27/2020   CREATININE 0.80 09/05/2020   CREATININE 0.82 06/02/2020     No results found for: EGFR Current Barriers:  Knowledge Deficits related to basic Diabetes pathophysiology and self care/management Knowledge Deficits related to medications used for management of diabetes Unable to independently manage DM Does not contact provider office for questions/concerns Case Manager Clinical Goal(s):  Collaboration with Venita Lick, NP regarding development and update of comprehensive plan of care as evidenced by provider attestation and co-signature Inter-disciplinary care team collaboration (see longitudinal plan of care) Over the next 120 days, patient will demonstrate improved adherence to prescribed treatment plan for diabetes self care/management as evidenced by:  daily monitoring and recording of CBG  adherence to ADA/ carb modified diet exercise 3/4 days/week adherence to prescribed medication regimen Interventions:  Provided education to patient about basic DM disease process.  07-12-2020: Review of heart healthy/ADA diet, medications compliance and overall DM health. The patient states he feels great.  Reviewed medications with patient and discussed importance of medication adherence. 07-12-2020: The patient ask about getting new script for his metformin 1075m tablets instead of 2- 500 mg tablets.  He wanted to know  why his wife had the 10044mtablet and he had the 500 mg x 2.  Education and support given. Education on not getting the 500 mg tablets mixed up with the 1000 mg tablets. The patient verbalized understanding. Will send in basket message to the pcp about changing script at new refill for 1000 mg tablets to be dispensed. 10-17-2020 After reconsidering, Patient reports he did not want to change Metformin tablet from 500 mg tab to 1000 mg tablet.  He continues to be careful and takes 2 tablets to equal 1000 mg. 01-16-2021: The patient states that he is taking his medications as directed. Denies any issues with compliance. Does not want to add additional medications for DM control, states he wants to work on dietary restrictions.  Discussed plans with patient for ongoing care management follow up and provided patient with direct contact information for care management team Provided patient with written educational materials related to hypo and hyperglycemia and importance of correct treatment.  01-16-2021: The patient states that he knows what to look for with changes in his blood sugars. Denies any lows or highs at this time.  Reviewed scheduled/upcoming provider appointments including: 02-27-2021 Advised patient, providing education and rationale, to check cbg BID and record, calling pcp for findings outside established parameters.  07-12-2020: The patient  states that his blood sugars have been good except when he ate the dump cake earlier this week. States he keeps a consistent check on his blood sugars.  He is mindful of highs and lows. 10-17-2020: Patient checks blood glucose every morning with range of 140-170 lately.  Patient states he "eats what he wants to eat". He talked about his age and desiring to eat what he wants at this point in life.  Patient and his wife are going on a trip out of state to grandson's wedding this weekend.  Patient has all his medications.  Reviewed all medications. Need to encourage heart  healthy /ADA diet . 01-16-2021: The patients most recent A1C was 7.8. He admits he ate too many sweets at his grandsons wedding. He wants to get back on track. Fasting range has been 190 to 200. Reviewed the goal of fasting <130 and post prandial of <180. The patient verbalized understanding. Will continue to monitor.  Review of patient status, including review of consultants reports, relevant laboratory and other test results, and medications completed. - barriers to adherence to treatment plan identified - blood glucose readings reviewed - resources required to improve adherence to care identified - self-awareness of signs/symptoms of hypo or hyperglycemia encouraged - use of blood glucose monitoring log promoted Patient Goals/Self-Care Activities patient will:  - UNABLE to independently manage DM Checks blood sugars as prescribed and utilize hyper and hypoglycemia protocol as needed Adheres to prescribed ADA/carb modified Follow Up Plan: Telephone follow up appointment with care management team member scheduled for: 03-21-2021 at 10:30 am    Task: RNCM: Alleviate Barriers to Glycemic Management Completed 01/16/2021  Outcome: Positive  Note:   Care Management Activities:    - barriers to adherence to treatment plan identified - blood glucose readings reviewed - resources required to improve adherence to care identified - self-awareness of signs/symptoms of hypo or hyperglycemia encouraged - use of blood glucose monitoring log promoted        Care Plan : RNCM: Hypertension (Adult)  Updates made by Vanita Ingles, RN since 01/16/2021 12:00 AM     Problem: RNCM: Hypertension (Hypertension)   Priority: Medium     Long-Range Goal: RNCM: Hypertension Monitored   Start Date: 05/17/2020  Expected End Date: 11/02/2020  This Visit's Progress: On track  Priority: Medium  Note:   Objective:  Last practice recorded BP readings:  BP Readings from Last 3 Encounters:  11/27/20 132/82   09/07/20 (!) 150/84  09/01/20 132/80    This SmartLink has not been configured with any valid records.     Most recent eGFR/CrCl: No results found for: EGFR  No components found for: CRCL Current Barriers:  Knowledge Deficits related to basic understanding of hypertension pathophysiology and self care management Knowledge Deficits related to understanding of medications prescribed for management of hypertension Unable to independently HTN Unable to self administer medications as prescribed Does not contact provider office for questions/concerns Case Manager Clinical Goal(s):  patient will verbalize understanding of plan for hypertension management patient will attend all scheduled medical appointments: 02-27-2021 at 340 pm patient will demonstrate improved adherence to prescribed treatment plan for hypertension as evidenced by taking all medications as prescribed, monitoring and recording blood pressure as directed, adhering to low sodium/DASH diet  patient will demonstrate improved health management independence as evidenced by checking blood pressure as directed and notifying PCP if SBP>160 or DBP > 90, taking all medications as prescribe, and adhering to a low sodium diet as discussed.  Interventions:  Collaboration with Venita Lick, NP regarding development and update of comprehensive plan of care as evidenced by provider attestation and co-signature Inter-disciplinary care team collaboration (see longitudinal plan of care) Evaluation of current treatment plan related to hypertension self management and patient's adherence to plan as established by provider. 01-16-2021: Denies any issues with HTN at this time. States his blood pressures have been good when taking at home. Did not have any current readings. States he and his wife take it one time a week at home.  Provided education to patient re: stroke prevention, s/s of heart attack and stroke, DASH diet, complications of uncontrolled  blood pressure Reviewed medications with patient and discussed importance of compliance. 01-16-2021: Endorses compliance with medications.  Discussed plans with patient for ongoing care management follow up and provided patient with direct contact information for care management team Advised patient, providing education and rationale, to monitor blood pressure daily and record, calling PCP for findings outside established parameters. 07-12-2020: The patient states that he has been having headaches and when he has headaches sometimes he has blood pressures that are elevated but not bad. The patient states he has always had headaches but some days are worse than others. Reminded patient to discuss this with the pcp on next visit for recommendations. 10-17-2020: Patient reports chronic headaches for years.  He uses Tylenol PRN with good relief. 01-16-2021: Denies any new issues. Will continue to monitor. Reviewed scheduled/upcoming provider appointments including: 02-27-2021 at 340 pm Patient Goals/Self-Care Activities patient will:  - UNABLE to independently management HTN Calls provider office for new concerns, questions, or BP outside discussed parameters Checks BP and records as discussed Follows a low sodium diet/DASH diet - blood pressure trends reviewed - depression screen reviewed - home or ambulatory blood pressure monitoring encouraged Follow Up Plan: Telephone follow up appointment with care management team member scheduled for: 03-21-2021 at 10:30am    Task: RNCM: Identify and Monitor Blood Pressure Elevation Completed 01/16/2021  Outcome: Positive  Note:   Care Management Activities:    - blood pressure trends reviewed - depression screen reviewed - home or ambulatory blood pressure monitoring encouraged        Care Plan : RNCM: Management of HLD  Updates made by Vanita Ingles, RN since 01/16/2021 12:00 AM     Problem: RNCM: Management of HLD   Priority: Medium     Long-Range  Goal: RNCM:HLD  Self-Management Plan Developed   Start Date: 05/17/2020  Expected End Date: 11/02/2021  This Visit's Progress: On track  Priority: Medium  Note:   Current Barriers:  Poorly controlled hyperlipidemia, complicated by DM/HTN Current antihyperlipidemic regimen: Lipitor 20 mg daily  Most recent lipid panel:  Lab Results  Component Value Date   CHOL 104 11/27/2020   CHOL 138 06/02/2020   CHOL 127 02/28/2020   Lab Results  Component Value Date   HDL 40 11/27/2020   HDL 44 06/02/2020   HDL 44 02/28/2020   Lab Results  Component Value Date   LDLCALC 42 11/27/2020   LDLCALC 67 06/02/2020   LDLCALC 58 02/28/2020   Lab Results  Component Value Date   TRIG 126 11/27/2020   TRIG 155 (H) 06/02/2020   TRIG 142 02/28/2020   Lab Results  Component Value Date   CHOLHDL 2.5 03/12/2018   CHOLHDL 2.7 01/13/2017   CHOLHDL 3.6 11/21/2014   No results found for: LDLDIRECT   ASCVD risk enhancing conditions: age >21, DM, HTN Unable to independently manage  HLD Does not contact provider office for questions/concerns  RN Care Manager Clinical Goal(s):   patient will work with [place], providers, and care team towards execution of optimized self-health management plan patient will verbalize understanding of plan for HLD patient will work with [facility] and pcp to address needs related to HLD and effective management   patient will attend all scheduled medical appointments: 02-27-2021 at 340 pm  Interventions: Collaboration with Venita Lick, NP regarding development and update of comprehensive plan of care as evidenced by provider attestation and co-signature Inter-disciplinary care team collaboration (see longitudinal plan of care) Medication review performed; medication list updated in electronic medical record.  Inter-disciplinary care team collaboration (see longitudinal plan of care) Referred to pharmacy team for assistance with HLD medication  management Evaluation of current treatment plan related to HLD and patient's adherence to plan as established by provider. 07-12-2020: The patient denies any new concerns with his HLD. States he is monitoring his dietary intake. Knows to call for changes in conditions or questions. 01-16-2021:  Patient continues to deny any concerns or questions regarding his HLD. Advised patient to call the office for changes in condition or questions concerning chronic conditions  Provided education to patient re: heart healthy/ADA diet and hidden sodium in food content  Discussed plans with patient for ongoing care management follow up and provided patient with direct contact information for care management team Reviewed scheduled/upcoming provider appointments including: 02-27-2021 at 340 pm  Patient Goals/Self-Care Activities: patient will:   - call for medicine refill 2 or 3 days before it runs out - call if I am sick and can't take my medicine - keep a list of all the medicines I take; vitamins and herbals too - learn to read medicine labels - drink 6 to 8 glasses of water each day - eat 3 to 5 servings of fruits and vegetables each day - eat 5 or 6 small meals each day - limit fast food meals to no more than 1 per week - manage portion size - prepare main meal at home 3 to 5 days each week - read food labels for fat, fiber, carbohydrates and portion size - be open to making changes - I can manage, know and watch for signs of a heart attack - if I have chest pain, call for help - learn about small changes that will make a big difference - learn my personal risk factors  - barriers to meeting goals identified - choices provided - collaboration with team encouraged - decision-making supported - health risks reviewed - questions answered - resources needed to meet goals identified - self-reflection promoted - self-reliance encouraged  Follow Up Plan: Telephone follow up appointment with care  management team member scheduled for:03-21-2021 at 10:30am      Task: RNCM: Mutually Develop and Royce Macadamia Achievement of Patient Goals Completed 01/16/2021  Outcome: Positive  Note:   Care Management Activities:    - barriers to meeting goals identified - choices provided - collaboration with team encouraged - decision-making supported - health risks reviewed - questions answered - resources needed to meet goals identified - self-reflection promoted - self-reliance encouraged        Care Plan : RNCM: RA Management  Updates made by Vanita Ingles, RN since 01/16/2021 12:00 AM     Problem: RNCM: Pain (RA)   Priority: Medium     Long-Range Goal: RNCM: Manage Pain- RA   Start Date: 05/17/2020  Expected End Date: 11/02/2021  This Visit's Progress: On track  Priority: Medium  Note:   Current Barriers:  Knowledge Deficits related to managing acute/chronic pain Non-adherence to scheduled provider appointments Non-adherence to prescribed medication regimen Difficulty obtaining medications Chronic Disease Management support and education needs related to chronic pain Lacks social connections Does not contact provider office for questions/concerns Nurse Case Manager Clinical Goal(s):  patient will verbalize understanding of plan for managing pain patient will attend all scheduled medical appointments: 02-27-2021 patient will demonstrate use of different relaxation  skills and/or diversional activities to assist with pain reduction (distraction, imagery, relaxation, massage, acupressure, TENS, heat, and cold application patient will report pain at a level less than 3 to 4 on a 10-10 rating scale patient will use pharmacological and nonpharmacological pain relief strategies patient will verbalize acceptable level of pain relief and ability to engage in desired activities patient will engage in desired activities without an increase in pain level Interventions:  Collaboration with  Venita Lick, NP regarding development and update of comprehensive plan of care as evidenced by provider attestation and co-signature Inter-disciplinary care team collaboration (see longitudinal plan of care) - deep breathing, relaxation and mindfulness use promoted - medication-induced side effects managed - misuse of pain medication assessed. 01-16-2021:  Is compliant with medications  - motivation and barriers to change assessed and addressed - mutually acceptable comfort goal set - pain assessed. 01-16-2021: Rates pain level at a 3 today on a scale of 0-10. States it is in his left foot. States that it comes and goes and he randomly has it. Discussed other factors that may cause pain issue. States he will monitor for changes.  - pain treatment goals reviewed - premedication prior to activity encouraged Evaluation of current treatment plan related to RA and patient's adherence to plan as established by provider. 10-17-2020: Patient reports he is doing fairly well today overall with pain control.  Patient states he has chronic headaches for years.  He has mild headache this morning upon waking.  He uses Tylenol PRN with good relief. 01-16-2021: The patient is doing well with management of his RA. Is having left foot pain rates at a 3 today. States that this is something that comes and goes and he cannot correlate it to anything else.  He says it will come and then leave and be gone 2 to 3 weeks and comes back randomly. Education on monitoring for changes in food choices when he notices the pain and to discuss with pcp at upcoming visit in October. The patient verbalized understanding. Will continue to monitor.  Advised patient to call the office for new questions or concerns  Provided education to patient re: alternative pain control methods  Reviewed medications with patient and discussed compliance. The patient was taken off of Leflunomide and started on Methotrexate. The patient states the  Leflunomide was causing diarrhea. He has not had any issues with the Methotrexate. Will continue to monitor.  Discussed plans with patient for ongoing care management follow up and provided patient with direct contact information for care management team Allow patient to maintain a diary of pain ratings, timing, precipitating events, medications, treatments, and what works best to relieve pain,  Refer to support groups and self-help groups Educate patient about the use of pharmacological interventions for pain management- antianxiety, antidepressants, NSAIDS, opioid analgesics,  Explain the importance of lifestyle modifications to effective pain management  Patient Goals/Self Care Activities:  - healthy lifestyle promoted - medication side effects managed - multimodal pain management plan  developed - pain assessed - response to pain management plan monitored - response to pharmacologic therapy monitored - support and encouragement provided - weight management encouraged Self-administers medications as prescribed Attends all scheduled provider appointments Calls pharmacy for medication refills Calls provider office for new concerns or questions Follow Up Plan: Telephone follow up appointment with care management team member scheduled for: 03-21-2021 at 10:30am      Task: RNCM: Facilitate Multimodal Pain Management Plan Completed 01/16/2021  Outcome: Positive  Note:   Care Management Activities:    - healthy lifestyle promoted - medication side effects managed - multimodal pain management plan developed - pain assessed - response to pain management plan monitored - response to pharmacologic therapy monitored - support and encouragement provided - weight management encouraged         Plan:Telephone follow up appointment with care management team member scheduled for:  03-21-2021 at 26 am  [place], MSN, [facility] Mobile: [phone]

## 2021-01-16 NOTE — Patient Instructions (Signed)
Visit Information  PATIENT GOALS:  Goals Addressed             This Visit's Progress    COMPLETED: Obtain Eye Exam-Diabetes Type 2       Follow Up Date 12/06/20   - schedule appointment with eye doctor    Why is this important?   Eye check-ups are important when you have diabetes.  Vision loss can be prevented.    Notes:      RNCM: Monitor and Manage My Blood Sugar-Diabetes Type 2       Timeframe:  Long-Range Goal Priority:  High Start Date:    05-17-2020- RNCM                         Expected End Date:        11-02-2021               Follow Up Date  03-21-2021   - check blood sugar at prescribed times - check blood sugar if I feel it is too high or too low - enter blood sugar readings and medication or insulin into daily log - take the blood sugar log to all doctor visits    Lab Results  Component Value Date   HGBA1C 7.8 (H) 11/27/2020    Why is this important?   Checking your blood sugar at home helps to keep it from getting very high or very low.  Writing the results in a diary or log helps the doctor know how to care for you.  Your blood sugar log should have the time, date and the results.  Also, write down the amount of insulin or other medicine that you take.  Other information, like what you ate, exercise done and how you were feeling, will also be helpful.     Notes: 01-16-2021: The patient states he really got off track when going to his grandsons wedding in [place] in June. States he is trying to do better. Still having blood sugars fasting 190 to 200. Review of goal of fasting <130 and post prandial of <180. Encouraged the patient to monitor and record.  The patient states he is working on better control.      RNCM: Track and Manage My Blood Pressure-Hypertension       Timeframe:  Long-Range Goal Priority:  Medium Start Date:          05-17-2020                   Expected End Date:   11-02-2021                    Follow Up Date 03-21-2021   - check blood  pressure 3 times per week - write blood pressure results in a log or diary  BP Readings from Last 3 Encounters:  11/27/20 132/82  09/07/20 (!) 150/84  09/01/20 132/80      Why is this important?   You won't feel high blood pressure, but it can still hurt your blood vessels.  High blood pressure can cause heart or kidney problems. It can also cause a stroke.  Making lifestyle changes like losing a little weight or eating less salt will help.  Checking your blood pressure at home and at different times of the day can help to control blood pressure.  If the doctor prescribes medicine remember to take it the way the doctor ordered.  Call the office if you  cannot afford the medicine or if there are questions about it.     Notes: 01-16-2021: States he and his wife take their blood pressure at home about one time a week. Denies any concerns with HTN at this time. Will continue to monitor.         Patient verbalizes understanding of instructions provided today and agrees to view in [place].   Telephone follow up appointment with care management team member scheduled for: 03-21-2021 at 76 am  Noreene Larsson RN, MSN, [facility] Mobile: [phone]

## 2021-01-31 ENCOUNTER — Ambulatory Visit: Payer: Medicare Other

## 2021-02-02 ENCOUNTER — Ambulatory Visit (INDEPENDENT_AMBULATORY_CARE_PROVIDER_SITE_OTHER): Payer: Medicare Other

## 2021-02-02 VITALS — Ht 70.0 in | Wt 205.0 lb

## 2021-02-02 DIAGNOSIS — E1169 Type 2 diabetes mellitus with other specified complication: Secondary | ICD-10-CM

## 2021-02-02 DIAGNOSIS — E1159 Type 2 diabetes mellitus with other circulatory complications: Secondary | ICD-10-CM | POA: Diagnosis not present

## 2021-02-02 DIAGNOSIS — Z0000 Encounter for general adult medical examination without abnormal findings: Secondary | ICD-10-CM

## 2021-02-02 DIAGNOSIS — E0821 Diabetes mellitus due to underlying condition with diabetic nephropathy: Secondary | ICD-10-CM

## 2021-02-02 DIAGNOSIS — M0609 Rheumatoid arthritis without rheumatoid factor, multiple sites: Secondary | ICD-10-CM

## 2021-02-02 DIAGNOSIS — E785 Hyperlipidemia, unspecified: Secondary | ICD-10-CM | POA: Diagnosis not present

## 2021-02-02 DIAGNOSIS — I152 Hypertension secondary to endocrine disorders: Secondary | ICD-10-CM

## 2021-02-02 NOTE — Patient Instructions (Signed)
[name] , Thank you for taking time to come for your Medicare Wellness Visit. I appreciate your ongoing commitment to your health goals. Please review the following plan we discussed and let me know if I can assist you in the future.   Screening recommendations/referrals: Colonoscopy: not required Recommended yearly ophthalmology/optometry visit for glaucoma screening and checkup Recommended yearly dental visit for hygiene and checkup  Vaccinations: Influenza vaccine: due Pneumococcal vaccine: completed 10/11/2019 Tdap vaccine: due Shingles vaccine: discussed   Covid-19:  11/28/2020, 02/25/2020, 06/05/2019, 05/12/2019  Advanced directives: Please bring a copy of your POA (Power of Attorney) and/or Living Will to your next appointment.   Conditions/risks identified: none  Next appointment: Follow up in one year for your annual wellness visit.   Preventive Care 1 Years and Older, Male Preventive care refers to lifestyle choices and visits with your health care provider that can promote health and wellness. What does preventive care include? A yearly physical exam. This is also called an annual well check. Dental exams once or twice a year. Routine eye exams. Ask your health care provider how often you should have your eyes checked. Personal lifestyle choices, including: Daily care of your teeth and gums. Regular physical activity. Eating a healthy diet. Avoiding tobacco and drug use. Limiting alcohol use. Practicing safe sex. Taking low doses of aspirin every day. Taking vitamin and mineral supplements as recommended by your health care provider. What happens during an annual well check? The services and screenings done by your health care provider during your annual well check will depend on your age, overall health, lifestyle risk factors, and family history of disease. Counseling  Your health care provider may ask you questions about your: Alcohol use. Tobacco use. Drug  use. Emotional well-being. Home and relationship well-being. Sexual activity. Eating habits. History of falls. Memory and ability to understand (cognition). Work and work Statistician. Screening  You may have the following tests or measurements: Height, weight, and BMI. Blood pressure. Lipid and cholesterol levels. These may be checked every 5 years, or more frequently if you are over 69 years old. Skin check. Lung cancer screening. You may have this screening every year starting at age 78 if you have a 30-pack-year history of smoking and currently smoke or have quit within the past 15 years. Fecal occult blood test (FOBT) of the stool. You may have this test every year starting at age 106. Flexible sigmoidoscopy or colonoscopy. You may have a sigmoidoscopy every 5 years or a colonoscopy every 10 years starting at age 53. Prostate cancer screening. Recommendations will vary depending on your family history and other risks. Hepatitis C blood test. Hepatitis B blood test. Sexually transmitted disease (STD) testing. Diabetes screening. This is done by checking your blood sugar (glucose) after you have not eaten for a while (fasting). You may have this done every 1-3 years. Abdominal aortic aneurysm (AAA) screening. You may need this if you are a current or former smoker. Osteoporosis. You may be screened starting at age 20 if you are at high risk. Talk with your health care provider about your test results, treatment options, and if necessary, the need for more tests. Vaccines  Your health care provider may recommend certain vaccines, such as: Influenza vaccine. This is recommended every year. Tetanus, diphtheria, and acellular pertussis (Tdap, Td) vaccine. You may need a Td booster every 10 years. Zoster vaccine. You may need this after age 4. Pneumococcal 13-valent conjugate (PCV13) vaccine. One dose is recommended after age 57.  Pneumococcal polysaccharide (PPSV23) vaccine. One dose is  recommended after age 74. Talk to your health care provider about which screenings and vaccines you need and how often you need them. This information is not intended to replace advice given to you by your health care provider. Make sure you discuss any questions you have with your health care provider. Document Released: 05/19/2015 Document Revised: 01/10/2016 Document Reviewed: 02/21/2015 Elsevier Interactive Patient Education  2017 [place] Prevention in the Home Falls can cause injuries. They can happen to people of all ages. There are many things you can do to make your home safe and to help prevent falls. What can I do on the outside of my home? Regularly fix the edges of walkways and driveways and fix any cracks. Remove anything that might make you trip as you walk through a door, such as a raised step or threshold. Trim any bushes or trees on the path to your home. Use bright outdoor lighting. Clear any walking paths of anything that might make someone trip, such as rocks or tools. Regularly check to see if handrails are loose or broken. Make sure that both sides of any steps have handrails. Any raised decks and porches should have guardrails on the edges. Have any leaves, snow, or ice cleared regularly. Use sand or salt on walking paths during winter. Clean up any spills in your garage right away. This includes oil or grease spills. What can I do in the bathroom? Use night lights. Install grab bars by the toilet and in the tub and shower. Do not use towel bars as grab bars. Use non-skid mats or decals in the tub or shower. If you need to sit down in the shower, use a plastic, non-slip stool. Keep the floor dry. Clean up any water that spills on the floor as soon as it happens. Remove soap buildup in the tub or shower regularly. Attach bath mats securely with double-sided non-slip rug tape. Do not have throw rugs and other things on the floor that can make you  trip. What can I do in the bedroom? Use night lights. Make sure that you have a light by your bed that is easy to reach. Do not use any sheets or blankets that are too big for your bed. They should not hang down onto the floor. Have a firm chair that has side arms. You can use this for support while you get dressed. Do not have throw rugs and other things on the floor that can make you trip. What can I do in the kitchen? Clean up any spills right away. Avoid walking on wet floors. Keep items that you use a lot in easy-to-reach places. If you need to reach something above you, use a strong step stool that has a grab bar. Keep electrical cords out of the way. Do not use floor polish or wax that makes floors slippery. If you must use wax, use non-skid floor wax. Do not have throw rugs and other things on the floor that can make you trip. What can I do with my stairs? Do not leave any items on the stairs. Make sure that there are handrails on both sides of the stairs and use them. Fix handrails that are broken or loose. Make sure that handrails are as long as the stairways. Check any carpeting to make sure that it is firmly attached to the stairs. Fix any carpet that is loose or worn. Avoid having throw rugs at the  top or bottom of the stairs. If you do have throw rugs, attach them to the floor with carpet tape. Make sure that you have a light switch at the top of the stairs and the bottom of the stairs. If you do not have them, ask someone to add them for you. What else can I do to help prevent falls? Wear shoes that: Do not have high heels. Have rubber bottoms. Are comfortable and fit you well. Are closed at the toe. Do not wear sandals. If you use a stepladder: Make sure that it is fully opened. Do not climb a closed stepladder. Make sure that both sides of the stepladder are locked into place. Ask someone to hold it for you, if possible. Clearly mark and make sure that you can  see: Any grab bars or handrails. First and last steps. Where the edge of each step is. Use tools that help you move around (mobility aids) if they are needed. These include: Canes. Walkers. Scooters. Crutches. Turn on the lights when you go into a dark area. Replace any light bulbs as soon as they burn out. Set up your furniture so you have a clear path. Avoid moving your furniture around. If any of your floors are uneven, fix them. If there are any pets around you, be aware of where they are. Review your medicines with your doctor. Some medicines can make you feel dizzy. This can increase your chance of falling. Ask your doctor what other things that you can do to help prevent falls. This information is not intended to replace advice given to you by your health care provider. Make sure you discuss any questions you have with your health care provider. Document Released: 02/16/2009 Document Revised: 09/28/2015 Document Reviewed: 05/27/2014 Elsevier Interactive Patient Education  2017 [employer].

## 2021-02-02 NOTE — Progress Notes (Signed)
I connected with Jason Contreras today by telephone and verified that I am speaking with the correct person using two identifiers. Location patient: home Location provider: work Persons participating in the virtual visit: Jason Contreras, Glenna Durand LPN.   I discussed the limitations, risks, security and privacy concerns of performing an evaluation and management service by telephone and the availability of in person appointments. I also discussed with the patient that there may be a patient responsible charge related to this service. The patient expressed understanding and verbally consented to this telephonic visit.    Interactive audio and video telecommunications were attempted between this provider and patient, however failed, due to patient having technical difficulties OR patient did not have access to video capability.  We continued and completed visit with audio only.     Vital signs may be patient reported or missing.  Subjective:   Jason Contreras is a 80 y.o. male who presents for Medicare Annual/Subsequent preventive examination.  Review of Systems     Cardiac Risk Factors include: advanced age (>92men, >46 women);diabetes mellitus;dyslipidemia;hypertension;male gender;sedentary lifestyle     Objective:    Today's Vitals   02/02/21 1446  Weight: 205 lb (93 kg)  Height: 5\' 10"  (1.778 m)   Body mass index is 29.41 kg/m.  Advanced Directives 02/02/2021 01/31/2020 01/21/2019 01/08/2018 10/17/2017 10/17/2017 10/14/2017  Does Patient Have a Medical Advance Directive? Yes Yes Yes Yes No No Yes  Type of Paramedic of [place];Living will [place];Living will Living will;Healthcare Power of [place];Living will - - -  Copy of [place] in Chart? No - copy requested No - copy requested No - copy requested No - copy requested - - -  Would patient like information on creating a medical advance  directive? - - - - No - Patient declined No - Patient declined -    Current Medications (verified) Outpatient Encounter Medications as of 02/02/2021  Medication Sig   acetaminophen (TYLENOL) 325 MG tablet Take 650 mg by mouth every 6 (six) hours as needed. Takes at bedtime and occasionally in am for back/hip pain   atorvastatin (LIPITOR) 20 MG tablet TAKE 1 TABLET BY MOUTH  DAILY AT 6 PM.   B Complex-C-Folic Acid (SUPER B COMPLEX/FA/VIT C PO) Take 1 tablet by mouth daily. Contains 767 mcg folic acid   carvedilol (COREG) 6.25 MG tablet TAKE 1 TABLET BY MOUTH  TWICE DAILY   cholecalciferol (VITAMIN D3) 25 MCG (1000 UNIT) tablet Take 2,000 Units by mouth daily.    DULoxetine (CYMBALTA) 60 MG capsule Take 1 capsule (60 mg total) by mouth daily.   folic acid (FOLVITE) 1 MG tablet Take 1 tablet (1 mg total) by mouth daily.   gabapentin (NEURONTIN) 600 MG tablet TAKE 1 TABLET BY MOUTH 3  TIMES DAILY   metFORMIN (GLUCOPHAGE) 500 MG tablet TAKE 2 TABLETS BY MOUTH  TWICE DAILY   methotrexate (RHEUMATREX) 2.5 MG tablet Take 2.5 mg by mouth once a week. 6 tabs weekly   mometasone (ELOCON) 0.1 % cream APPLY  CREAM TOPICALLY ONCE DAILY   tamsulosin (FLOMAX) 0.4 MG CAPS capsule TAKE 1 CAPSULE BY MOUTH  DAILY   vitamin C (ASCORBIC ACID) 500 MG tablet Take 500 mg by mouth daily.   COVID-19 mRNA vaccine, Pfizer, 30 MCG/0.3ML injection USE AS DIRECTED   finasteride (PROSCAR) 5 MG tablet Take 1 tablet (5 mg total) by mouth daily. (Patient not taking: Reported on 02/02/2021)  lidocaine (LIDODERM) 5 % Place 1 patch onto the skin daily. Remove & Discard patch within 12 hours or as directed by MD (Patient not taking: Reported on 02/02/2021)   No facility-administered encounter medications on file as of 02/02/2021.    Allergies (verified) Morphine and Morphine and related   History: Past Medical History:  Diagnosis Date   Adenocarcinoma, renal cell (HCC)    Arthritis    Collagen vascular disease ([place])     Diabetes mellitus without complication (HCC)    Elevated PSA    Headache    Hematuria    Hyperlipidemia    Hypertension    Neuropathy    Obesity    PONV (postoperative nausea and vomiting)    Renal insufficiency    Right renal mass    Past Surgical History:  Procedure Laterality Date   APPENDECTOMY     CRYOABLATION  10/17/2017   IR RADIOLOGIST EVAL & MGMT  07/17/2017   IR RADIOLOGIST EVAL & MGMT  08/13/2017   IR RADIOLOGIST EVAL & MGMT  11/12/2017   IR RADIOLOGIST EVAL & MGMT  03/05/2018   RADIOLOGY WITH ANESTHESIA Right 10/17/2017   Procedure: CT WITH ANESTHESIA RENAL CRYOABLATION;  Surgeon: [name], MD;  Location: [facility];  Service: Anesthesiology;  Laterality: Right;   ROBOTIC ASSITED PARTIAL NEPHRECTOMY Right 12/12/2015   Procedure: ROBOTIC ASSITED PARTIAL NEPHRECTOMY;  Surgeon: [name], MD;  Location: [facility] OR[facility];  Service: Urology;  Laterality: Right;   Family History  Problem Relation Age of Onset   Bone cancer Mother    Cancer Mother    Cancer Father    Stroke Brother    Kidney disease Neg Hx    Social History   Socioeconomic History   Marital status: Married    Spouse name: Not on file   Number of children: Not on file   Years of education: 3 years college    Highest education level: Some college, no degree  Occupational History   Not on file  Tobacco Use   Smoking status: Never   Smokeless tobacco: Never  Vaping Use   Vaping Use: Never used  Substance and Sexual Activity   Alcohol use: No   Drug use: No   Sexual activity: Not Currently  Other Topics Concern   Not on file  Social History Narrative   Not on file   Social Determinants of Health   Financial Resource Strain: Low Risk    Difficulty of Paying Living Expenses: Not hard at all  Food Insecurity: No Food Insecurity   Worried About Charity fundraiser in the Last Year: Never true   [place] in the Last Year: Never true  Transportation Needs: No Transportation Needs   Lack of  Transportation (Medical): No   Lack of Transportation (Non-Medical): No  Physical Activity: Inactive   Days of Exercise per Week: 0 days   Minutes of Exercise per Session: 0 min  Stress: No Stress Concern Present   Feeling of Stress : Not at all  Social Connections: Moderately Integrated   Frequency of Communication with Friends and Family: More than three times a week   Frequency of Social Gatherings with Friends and Family: More than three times a week   Attends Religious Services: More than 4 times per year   Active Member of [employer] or Organizations: No   Attends Archivist Meetings: Never   Marital Status: Married    Tobacco Counseling Counseling given: Not Answered   Clinical Intake:  Pre-visit preparation completed: Yes  Pain : No/denies pain     Nutritional Status: BMI 25 -29 Overweight Nutritional Risks: None Diabetes: Yes  How often do you need to have someone help you when you read instructions, pamphlets, or other written materials from your doctor or pharmacy?: 1 - Never What is the last grade level you completed in school?: some college  Diabetic? Yes Nutrition Risk Assessment:  Has the patient had any N/V/D within the last 2 months?  No  Does the patient have any non-healing wounds?  No  Has the patient had any unintentional weight loss or weight gain?  Yes   Diabetes:  Is the patient diabetic?  Yes  If diabetic, was a CBG obtained today?  No  Did the patient bring in their glucometer from home?  No  How often do you monitor your CBG's? Twice weekly.   Financial Strains and Diabetes Management:  Are you having any financial strains with the device, your supplies or your medication? No .  Does the patient want to be seen by Chronic Care Management for management of their diabetes?  No  Would the patient like to be referred to a Nutritionist or for Diabetic Management?  No   Diabetic Exams:  Diabetic Eye Exam: Overdue for diabetic eye  exam. Pt has been advised about the importance in completing this exam. Patient advised to call and schedule an eye exam. Diabetic Foot Exam: Completed 06/02/2020   Interpreter Needed?: No  Information entered by :: NAllen LPN   Activities of Daily Living In your present state of health, do you have any difficulty performing the following activities: 02/02/2021 06/02/2020  Hearing? N N  Vision? Y N  Comment decreased sight in right eye -  Difficulty concentrating or making decisions? N N  Walking or climbing stairs? N N  Dressing or bathing? N N  Doing errands, shopping? N N  Preparing Food and eating ? N -  Using the Toilet? N -  In the past six months, have you accidently leaked urine? N -  Do you have problems with loss of bowel control? N -  Managing your Medications? N -  Managing your Finances? N -  Housekeeping or managing your Housekeeping? N -  Some recent data might be hidden    Patient Care Team: Venita Lick, NP as PCP - General (Nurse Practitioner) [name], MD as Consulting Physician (Urology) [name], MD as Consulting Physician (Interventional Radiology) Vanita Ingles, RN as Case Manager ([place]) Vladimir Faster, [facility] as Pharmacist (Pharmacist)  Indicate any recent Medical Services you may have received from other than [facility] providers in the past year (date may be approximate).     Assessment:   This is a routine wellness examination for [place].  Hearing/Vision screen Vision Screening - Comments:: No regular eye exams, [facility]  Dietary issues and exercise activities discussed: Current Exercise Habits: The patient does not participate in regular exercise at present   Goals Addressed             This Visit's Progress    Patient Stated       02/02/2021, stay alive       Depression Screen PHQ 2/9 Scores 02/02/2021 01/16/2021 06/02/2020 01/31/2020 06/11/2019 05/28/2019 01/21/2019  PHQ - 2 Score 0 0 0 0 0 0 0  PHQ- 9  Score - - 0 - - 6 -    Fall Risk Fall Risk  02/02/2021 06/02/2020 01/31/2020 06/11/2019 01/21/2019  Falls in the past year? 0 0 0 0 0  Number falls in past yr: - - - 0 -  Injury with Fall? - - - 0 -  Risk for fall due to : Medication side effect - Medication side effect Impaired mobility;Orthopedic patient -  Follow up Falls evaluation completed;Education provided;Falls prevention discussed - Falls evaluation completed;Education provided;Falls prevention discussed Falls evaluation completed;Education provided;Falls prevention discussed -    FALL RISK PREVENTION PERTAINING TO THE HOME:  Any stairs in or around the home? Yes  If so, are there any without handrails? No  Home free of loose throw rugs in walkways, pet beds, electrical cords, etc? Yes  Adequate lighting in your home to reduce risk of falls? Yes   ASSISTIVE DEVICES UTILIZED TO PREVENT FALLS:  Life alert? No  Use of a cane, walker or w/c? No  Grab bars in the bathroom? Yes  Shower chair or bench in shower? Yes  Elevated toilet seat or a handicapped toilet? No   TIMED UP AND GO:  Was the test performed? No .       Cognitive Function:     6CIT Screen 02/02/2021 01/31/2020 01/08/2018 01/01/2017  What Year? 0 points 0 points 0 points 0 points  What month? 0 points 0 points 0 points 0 points  What time? 0 points 0 points 0 points 0 points  Count back from 20 0 points 0 points 0 points 0 points  Months in reverse 0 points 0 points 0 points 0 points  Repeat phrase 0 points 0 points 0 points 0 points  Total Score 0 0 0 0    Immunizations Immunization History  Administered Date(s) Administered   Fluad Quad(high Dose 65+) 01/25/2019, 03/13/2020   Influenza, High Dose Seasonal PF 03/25/2016, 01/13/2017, 03/12/2018   Influenza,inj,Quad PF,6+ Mos 03/13/2015   Influenza-Unspecified 04/18/2014   PFIZER(Purple Top)SARS-COV-2 Vaccination 05/12/2019, 06/05/2019, 02/25/2020, 11/28/2020   Pneumococcal Conjugate-13 10/19/2013    Pneumococcal Polysaccharide-23 09/03/1997, 02/26/2005, 10/10/2019, 10/11/2019   Td 02/17/2008    TDAP status: Due, Education has been provided regarding the importance of this vaccine. Advised may receive this vaccine at local pharmacy or Health Dept. Aware to provide a copy of the vaccination record if obtained from local pharmacy or Health Dept. Verbalized acceptance and understanding.  Flu Vaccine status: Due, Education has been provided regarding the importance of this vaccine. Advised may receive this vaccine at local pharmacy or Health Dept. Aware to provide a copy of the vaccination record if obtained from local pharmacy or Health Dept. Verbalized acceptance and understanding.  Pneumococcal vaccine status: Up to date  Covid-19 vaccine status: Completed vaccines  Qualifies for Shingles Vaccine? Yes   Zostavax completed No   Shingrix Completed?: No.    Education has been provided regarding the importance of this vaccine. Patient has been advised to call insurance company to determine out of pocket expense if they have not yet received this vaccine. Advised may also receive vaccine at local pharmacy or Health Dept. Verbalized acceptance and understanding.  Screening Tests Health Maintenance  Topic Date Due   OPHTHALMOLOGY EXAM  05/03/2017   INFLUENZA VACCINE  12/04/2020   Zoster Vaccines- Shingrix (1 of 2) 02/27/2021 (Originally 02/27/1960)   TETANUS/TDAP  06/02/2021 (Originally 02/16/2018)   COVID-19 Vaccine (5 - Booster for Pfizer series) 03/31/2021   HEMOGLOBIN A1C  05/30/2021   FOOT EXAM  06/02/2021   URINE MICROALBUMIN  06/14/2021   Hepatitis C Screening  Completed   HPV VACCINES  Aged Out  Health Maintenance  Health Maintenance Due  Topic Date Due   OPHTHALMOLOGY EXAM  05/03/2017   INFLUENZA VACCINE  12/04/2020    Colorectal cancer screening: No longer required.   Lung Cancer Screening: (Low Dose CT Chest recommended if Age 58-80 years, 30 pack-year currently  smoking OR have quit w/in 15years.) does not qualify.   Lung Cancer Screening Referral: no  Additional Screening:  Hepatitis C Screening: does qualify; Completed 10/11/2019  Vision Screening: Recommended annual ophthalmology exams for early detection of glaucoma and other disorders of the eye. Is the patient up to date with their annual eye exam?  No  Who is the provider or what is the name of the office in which the patient attends annual eye exams? [facility] If pt is not established with a provider, would they like to be referred to a provider to establish care? No .   Dental Screening: Recommended annual dental exams for proper oral hygiene  Community Resource Referral / Chronic Care Management: CRR required this visit?  No   CCM required this visit?  No      Plan:     I have personally reviewed and noted the following in the patient's chart:   Medical and social history Use of alcohol, tobacco or illicit drugs  Current medications and supplements including opioid prescriptions. Patient is not currently taking opioid prescriptions. Functional ability and status Nutritional status Physical activity Advanced directives List of other physicians Hospitalizations, surgeries, and ER visits in previous 12 months Vitals Screenings to include cognitive, depression, and falls Referrals and appointments  In addition, I have reviewed and discussed with patient certain preventive protocols, quality metrics, and best practice recommendations. A written personalized care plan for preventive services as well as general preventive health recommendations were provided to patient.     Kellie Simmering, LPN   3/32/9518   Nurse Notes:

## 2021-02-08 ENCOUNTER — Other Ambulatory Visit: Payer: Self-pay | Admitting: Nurse Practitioner

## 2021-02-08 MED ORDER — FINASTERIDE 5 MG PO TABS: 5.0000 mg | ORAL_TABLET | Freq: Every day | ORAL | 4 refills | Status: DC

## 2021-02-08 MED ORDER — FOLIC ACID 1 MG PO TABS: 1.0000 mg | ORAL_TABLET | Freq: Every day | ORAL | 4 refills | Status: AC

## 2021-02-09 ENCOUNTER — Telehealth: Payer: Self-pay

## 2021-02-09 NOTE — Chronic Care Management (AMB) (Unsigned)
Chronic Care Management Pharmacy Assistant   Name: Jason Contreras  MRN: [id number] DOB: [birth year]  Jason Contreras is an 80 y.o. year old male who presents for his follow-up CCM visit with the clinical pharmacist.  Reason for Encounter: Disease State   Recent office visits:  11/27/20 Jason Guarneri NP PCP- pt was seen for DM. Labs were ordered and pt started Folic Acid. Follow up in 3 months.  Recent consult visits:  11/14/20 [name]- pt was seen for Rhuematoid Arthritis.  Hospital visits:  None in previous 6 months  Medications: Outpatient Encounter Medications as of 02/09/2021  Medication Sig   acetaminophen (TYLENOL) 325 MG tablet Take 650 mg by mouth every 6 (six) hours as needed. Takes at bedtime and occasionally in am for back/hip pain   atorvastatin (LIPITOR) 20 MG tablet TAKE 1 TABLET BY MOUTH  DAILY AT 6 PM.   B Complex-C-Folic Acid (SUPER B COMPLEX/FA/VIT C PO) Take 1 tablet by mouth daily. Contains 659 mcg folic acid   carvedilol (COREG) 6.25 MG tablet TAKE 1 TABLET BY MOUTH  TWICE DAILY   cholecalciferol (VITAMIN D3) 25 MCG (1000 UNIT) tablet Take 2,000 Units by mouth daily.    COVID-19 mRNA vaccine, Pfizer, 30 MCG/0.3ML injection USE AS DIRECTED   DULoxetine (CYMBALTA) 60 MG capsule Take 1 capsule (60 mg total) by mouth daily.   finasteride (PROSCAR) 5 MG tablet Take 1 tablet (5 mg total) by mouth daily.   folic acid (FOLVITE) 1 MG tablet Take 1 tablet (1 mg total) by mouth daily.   gabapentin (NEURONTIN) 600 MG tablet TAKE 1 TABLET BY MOUTH 3  TIMES DAILY   lidocaine (LIDODERM) 5 % Place 1 patch onto the skin daily. Remove & Discard patch within 12 hours or as directed by MD (Patient not taking: Reported on 02/02/2021)   metFORMIN (GLUCOPHAGE) 500 MG tablet TAKE 2 TABLETS BY MOUTH  TWICE DAILY   methotrexate (RHEUMATREX) 2.5 MG tablet Take 2.5 mg by mouth once a week. 6 tabs weekly   mometasone (ELOCON) 0.1 % cream APPLY  CREAM TOPICALLY ONCE  DAILY   tamsulosin (FLOMAX) 0.4 MG CAPS capsule TAKE 1 CAPSULE BY MOUTH  DAILY   vitamin C (ASCORBIC ACID) 500 MG tablet Take 500 mg by mouth daily.   No facility-administered encounter medications on file as of 02/09/2021.    Recent Relevant Labs: Lab Results  Component Value Date/Time   HGBA1C 7.8 (H) 11/27/2020 01:28 PM   HGBA1C 7.3 (H) 09/01/2020 02:22 PM   HGBA1C 6.3 12/27/2015 12:00 AM   MICROALBUR 10 06/14/2020 11:04 AM   MICROALBUR 10 05/28/2019 09:12 AM    Kidney Function Lab Results  Component Value Date/Time   CREATININE 0.88 11/27/2020 01:32 PM   CREATININE 0.80 09/05/2020 08:15 AM   GFRNONAA 85 06/02/2020 02:54 PM   GFRAA 99 06/02/2020 02:54 PM    Current antihyperglycemic regimen:  Metformin 1000 mg bid  What recent interventions/DTPs have been made to improve glycemic control:  none  Have there been any recent hospitalizations or ED visits since last visit with CPP? No   Patient {reports/denies:24182} hypoglycemic symptoms, including {Hypoglycemic Symptoms:3049003}   Patient {reports/denies:24182} hyperglycemic symptoms, including {symptoms; hyperglycemia:17903}   How often are you checking your blood sugar? {BG Testing frequency:23922}   What are your blood sugars ranging?  Fasting: *** Before meals: *** After meals: *** Bedtime: ***  During the week, how often does your blood glucose drop below 70? {LowBGfrequency:24142}   Are  you checking your feet daily/regularly?   Adherence Review: Is the patient currently on a STATIN medication? {yes/no:20286} Is the patient currently on ACE/ARB medication? {yes/no:20286} Does the patient have >5 day gap between last estimated fill dates? {yes/no:20286}    Star Rating Drugs: atorvastatin (LIPITOR) 20 MG tablet last fill 12/14/20 90 DS   [place] Clinical Pharmacist Assistant [phone]

## 2021-02-20 ENCOUNTER — Other Ambulatory Visit: Payer: Self-pay | Admitting: Nurse Practitioner

## 2021-02-21 NOTE — Telephone Encounter (Signed)
Requested Prescriptions  Pending Prescriptions Disp Refills  . gabapentin (NEURONTIN) 600 MG tablet [Pharmacy Med Name: Gabapentin 600 MG Oral Tablet] 270 tablet 0    Sig: TAKE 1 TABLET BY MOUTH 3  TIMES DAILY     Neurology: Anticonvulsants - gabapentin Passed - 02/20/2021 11:38 PM      Passed - Valid encounter within last 12 months    Recent Outpatient Visits          2 months ago Diabetes mellitus due to underlying condition with diabetic nephropathy, without long-term current use of insulin ([place])   [place], [place] T, NP   5 months ago Diabetes mellitus due to underlying condition with diabetic nephropathy, without long-term current use of insulin ([place])   [place], [place] T, NP   8 months ago Diabetes mellitus due to underlying condition with diabetic nephropathy, without long-term current use of insulin ([place])   [place], Jolene T, NP   11 months ago Diabetes mellitus due to underlying condition with diabetic nephropathy, without long-term current use of insulin ([place])   [place], [place] T, NP   1 year ago Diabetes mellitus due to underlying condition with diabetic nephropathy, without long-term current use of insulin ([place])   [place], [name], NP      Future Appointments            In 6 days Venita Lick, NP [employer], [place]   In 6 months [name], MD [place]   In 54 months  [employer], [facility]

## 2021-02-27 ENCOUNTER — Ambulatory Visit (INDEPENDENT_AMBULATORY_CARE_PROVIDER_SITE_OTHER): Payer: Medicare Other | Admitting: Nurse Practitioner

## 2021-02-27 ENCOUNTER — Other Ambulatory Visit: Payer: Self-pay

## 2021-02-27 ENCOUNTER — Encounter: Payer: Self-pay | Admitting: Nurse Practitioner

## 2021-02-27 VITALS — BP 128/86 | HR 92 | Temp 98.4°F | Wt 203.2 lb

## 2021-02-27 DIAGNOSIS — E785 Hyperlipidemia, unspecified: Secondary | ICD-10-CM

## 2021-02-27 DIAGNOSIS — E1159 Type 2 diabetes mellitus with other circulatory complications: Secondary | ICD-10-CM

## 2021-02-27 DIAGNOSIS — E559 Vitamin D deficiency, unspecified: Secondary | ICD-10-CM | POA: Diagnosis not present

## 2021-02-27 DIAGNOSIS — M0609 Rheumatoid arthritis without rheumatoid factor, multiple sites: Secondary | ICD-10-CM

## 2021-02-27 DIAGNOSIS — D692 Other nonthrombocytopenic purpura: Secondary | ICD-10-CM | POA: Diagnosis not present

## 2021-02-27 DIAGNOSIS — I152 Hypertension secondary to endocrine disorders: Secondary | ICD-10-CM

## 2021-02-27 DIAGNOSIS — E538 Deficiency of other specified B group vitamins: Secondary | ICD-10-CM

## 2021-02-27 DIAGNOSIS — E1169 Type 2 diabetes mellitus with other specified complication: Secondary | ICD-10-CM | POA: Diagnosis not present

## 2021-02-27 DIAGNOSIS — E0821 Diabetes mellitus due to underlying condition with diabetic nephropathy: Secondary | ICD-10-CM | POA: Diagnosis not present

## 2021-02-27 DIAGNOSIS — Z23 Encounter for immunization: Secondary | ICD-10-CM

## 2021-02-27 LAB — BAYER DCA HB A1C WAIVED: HB A1C (BAYER DCA - WAIVED): 7.5 % — ABNORMAL HIGH (ref 4.8–5.6)

## 2021-02-27 NOTE — Assessment & Plan Note (Signed)
Chronic, ongoing.  Continue current medication regimen and adjust as needed. Lipid panel today. 

## 2021-02-27 NOTE — Progress Notes (Signed)
BP 128/86 (BP Location: Left Arm, Patient Position: Sitting)   Pulse 92   Temp 98.4 F (36.9 C) (Oral)   Wt 203 lb 3.2 oz (92.2 kg)   BMI 29.16 kg/m    Subjective:    Patient ID: Jason Contreras, male    DOB: [birth year], 80 y.o.   MRN: [id number]  HPI: Jason Contreras is a 80 y.o. male  Chief Complaint  Patient presents with   Diabetes   Hyperlipidemia   Hypertension   Arthritis   Vitamin D    Vitamin B12   DIABETES Taking Metformin 1000 MG BID, discontinued Actos and Amaryl over past year due to age and heart health.  A1C in July 7.8%, trend up due to diet and he wanted to not add on injectable.  Does endorse ongoing dietary indiscretions -- this morning had 2 ham biscuits.    Has history of low levels Vitamin B12 and D, which have improved with supplements on board. Hypoglycemic episodes:no Polydipsia/polyuria: no Visual disturbance: no Chest pain: no Paresthesias: no Glucose Monitoring: yes             Accucheck frequency: occasional             Fasting glucose: 200 something this morning             Post prandial:             Evening:             Before meals: Taking Insulin?: no             Long acting insulin:             Short acting insulin: Blood Pressure Monitoring: daily Retinal Examination: Not up to Date Foot Exam: Up to Date Pneumovax: Not up to Date Influenza: Up to Date Aspirin: no    HYPERTENSION / HYPERLIPIDEMIA Taking Coreg 6.25 MG BID + Atorvastatin 20 MG daily.  Satisfied with current treatment? yes Duration of hypertension: chronic BP monitoring frequency: daily BP range: <130/80 average at home BP medication side effects: no Duration of hyperlipidemia: chronic Cholesterol medication side effects: no Cholesterol supplements: none Medication compliance: good compliance Aspirin: no Recent stressors: no Recurrent headaches: no Visual changes: no Palpitations: no Dyspnea: no Chest pain: no Lower extremity edema:  no Dizzy/lightheaded: no    RHEUMATOID ARTHRITIS Followed by rheumatology for RA of multiple sites with negative Rf.  Last visit 11/14/20. Taking Methotrexate and Duloxetine + Folic Acid. Pain control status: stable Duration: chronic Location: multiple areas What Activities task can be accomplished with current medication? ADL's daily Previous pain specialty evaluation: yes Non-narcotic analgesic meds: yes   Relevant past medical, surgical, family and social history reviewed and updated as indicated. Interim medical history since our last visit reviewed. Allergies and medications reviewed and updated.  Review of Systems  Constitutional:  Negative for activity change, diaphoresis, fatigue and fever.  Respiratory:  Negative for cough, chest tightness, shortness of breath and wheezing.   Cardiovascular:  Negative for chest pain, palpitations and leg swelling.  Gastrointestinal: Negative.   Endocrine: Negative for polydipsia, polyphagia and polyuria.  Neurological:  Negative for dizziness, syncope, weakness and numbness.  Psychiatric/Behavioral: Negative.     Per HPI unless specifically indicated above     Objective:    BP 128/86 (BP Location: Left Arm, Patient Position: Sitting)   Pulse 92   Temp 98.4 F (36.9 C) (Oral)   Wt 203 lb 3.2 oz (92.2 kg)   BMI  29.16 kg/m   Wt Readings from Last 3 Encounters:  02/27/21 203 lb 3.2 oz (92.2 kg)  02/02/21 205 lb (93 kg)  11/27/20 203 lb 3.2 oz (92.2 kg)    Physical Exam Vitals and nursing note reviewed.  Constitutional:      General: He is awake.     Appearance: He is well-developed and overweight.  HENT:     Head: Normocephalic and atraumatic.     Right Ear: Hearing normal. No drainage.     Left Ear: Hearing normal. No drainage.  Eyes:     General: Lids are normal.        Right eye: No discharge.        Left eye: No discharge.     Conjunctiva/sclera: Conjunctivae normal.     Pupils: Pupils are equal, round, and reactive to  light.  Neck:     Thyroid: No thyromegaly.     Vascular: No carotid bruit or JVD.     Trachea: Trachea normal.  Cardiovascular:     Rate and Rhythm: Normal rate and regular rhythm.     Heart sounds: Normal heart sounds, S1 normal and S2 normal. No murmur heard.   No gallop.  Pulmonary:     Effort: Pulmonary effort is normal. No accessory muscle usage or respiratory distress.     Breath sounds: Normal breath sounds.  Abdominal:     General: Bowel sounds are normal.     Palpations: Abdomen is soft.  Musculoskeletal:        General: Normal range of motion.     Cervical back: Normal range of motion and neck supple.     Right lower leg: No edema.     Left lower leg: No edema.  Skin:    General: Skin is warm and dry.     Capillary Refill: Capillary refill takes less than 2 seconds.     Comments: Scattered pale purple/yellow bruises noted to BUE.  Neurological:     Mental Status: He is alert and oriented to person, place, and time.     Coordination: Coordination is intact.     Gait: Gait is intact.     Deep Tendon Reflexes: Reflexes are normal and symmetric.     Reflex Scores:      Brachioradialis reflexes are 2+ on the right side and 2+ on the left side.      Patellar reflexes are 2+ on the right side and 2+ on the left side. Psychiatric:        Mood and Affect: Mood normal.        Behavior: Behavior normal. Behavior is cooperative.        Thought Content: Thought content normal.        Judgment: Judgment normal.    Results for orders placed or performed in visit on 02/27/21  Bayer DCA Hb A1c Waived  Result Value Ref Range   HB A1C (BAYER DCA - WAIVED) 7.5 (H) 4.8 - 5.6 %      Assessment & Plan:   Problem List Items Addressed This Visit       Cardiovascular and Mediastinum   Hypertension associated with diabetes ([place])    Chronic, stable with BP at goal today in office today on recheck and at goal on home readings when checks.  Recommend he monitor BP at home at least  three mornings a week + focus on DASH diet.  Continue current medication regimen and adjust as needed.  Labs: none, up to date.  Return in 3 months.      Relevant Orders   Bayer DCA Hb A1c Waived (Completed)   Senile purpura (HCC)    Chronic, stable.  Recommend continue to perform gentle skin care and monitor for skin breakdown, if present notify provider.        Endocrine   DM due to underlying condition with diabetic nephropathy ([place]) - Primary    Chronic, ongoing in patient with history of renal cell CA.  A1C today 7.5%, slight trend down but still elevated due to dietary indiscretions.  Continue current medication regimen and adjust as needed -- discussed addition of Trulicity which his wife also takes, but he wishes to hold off and focus on diet changes,  Monitor BS daily in morning at home.  Continue Metformin, consider use of alternate options such as GLP if elevations present next visit, due to his age and risk for hypoglycemia. Could trial SGLT2, but with urinary symptoms may not tolerate.  Return to office in 3 months.      Relevant Orders   Bayer DCA Hb A1c Waived (Completed)   Hyperlipidemia associated with type 2 diabetes mellitus (HCC)    Chronic, ongoing.  Continue current medication regimen and adjust as needed.  Lipid panel today.      Relevant Orders   Bayer DCA Hb A1c Waived (Completed)     Musculoskeletal and Integument   Rheumatoid arthritis of multiple sites with negative rheumatoid factor (HCC)    Chronic, ongoing.  Followed by rheumatology with much improved functional status since initially meeting this patient one year ago.  Continue this collaboration and current regimen as prescribed by them.  Recent note reviewed.        Other   Vitamin D deficiency    Ongoing.  Continue daily supplement and recheck level next visit, recent was stable.      B12 deficiency    Ongoing and improving with supplement with recent level at goal.  Recheck today and consider  reducing or discontinuing supplement as needed.      Relevant Orders   Vitamin B12   Other Visit Diagnoses     Flu vaccine need       Flu vaccine today   Relevant Orders   Flu Vaccine QUAD High Dose(Fluad) (Completed)        Follow up plan: Return in about 3 months (around 06/04/2021) for Annual physical and diabetes check.

## 2021-02-27 NOTE — Assessment & Plan Note (Signed)
Ongoing.  Continue daily supplement and recheck level next visit, recent was stable.

## 2021-02-27 NOTE — Assessment & Plan Note (Signed)
Chronic, ongoing.  Followed by rheumatology with much improved functional status since initially meeting this patient one year ago.  Continue this collaboration and current regimen as prescribed by them.  Recent note reviewed.

## 2021-02-27 NOTE — Assessment & Plan Note (Signed)
Ongoing and improving with supplement with recent level at goal.  Recheck today and consider reducing or discontinuing supplement as needed.

## 2021-02-27 NOTE — Assessment & Plan Note (Signed)
Chronic, stable.  Recommend continue to perform gentle skin care and monitor for skin breakdown, if present notify provider.

## 2021-02-27 NOTE — Assessment & Plan Note (Signed)
Chronic, ongoing in patient with history of renal cell CA.  A1C today 7.5%, slight trend down but still elevated due to dietary indiscretions.  Continue current medication regimen and adjust as needed -- discussed addition of Trulicity which his wife also takes, but he wishes to hold off and focus on diet changes,  Monitor BS daily in morning at home.  Continue Metformin, consider use of alternate options such as GLP if elevations present next visit, due to his age and risk for hypoglycemia. Could trial SGLT2, but with urinary symptoms may not tolerate.  Return to office in 3 months.

## 2021-02-27 NOTE — Assessment & Plan Note (Signed)
Chronic, stable with BP at goal today in office today on recheck and at goal on home readings when checks.  Recommend he monitor BP at home at least three mornings a week + focus on DASH diet.  Continue current medication regimen and adjust as needed.  Labs: none, up to date.  Return in 3 months.

## 2021-02-27 NOTE — Patient Instructions (Signed)
Diabetes Mellitus and Nutrition, Adult When you have diabetes, or diabetes mellitus, it is very important to have healthy eating habits because your blood sugar (glucose) levels are greatly affected by what you eat and drink. Eating healthy foods in the right amounts, at about the same times every day, can help you:  Control your blood glucose.  Lower your risk of heart disease.  Improve your blood pressure.  Reach or maintain a healthy weight. What can affect my meal plan? Every person with diabetes is different, and each person has different needs for a meal plan. Your health care provider may recommend that you work with a dietitian to make a meal plan that is best for you. Your meal plan may vary depending on factors such as:  The calories you need.  The medicines you take.  Your weight.  Your blood glucose, blood pressure, and cholesterol levels.  Your activity level.  Other health conditions you have, such as heart or kidney disease. How do carbohydrates affect me? Carbohydrates, also called carbs, affect your blood glucose level more than any other type of food. Eating carbs naturally raises the amount of glucose in your blood. Carb counting is a method for keeping track of how many carbs you eat. Counting carbs is important to keep your blood glucose at a healthy level, especially if you use insulin or take certain oral diabetes medicines. It is important to know how many carbs you can safely have in each meal. This is different for every person. Your dietitian can help you calculate how many carbs you should have at each meal and for each snack. How does alcohol affect me? Alcohol can cause a sudden decrease in blood glucose (hypoglycemia), especially if you use insulin or take certain oral diabetes medicines. Hypoglycemia can be a life-threatening condition. Symptoms of hypoglycemia, such as sleepiness, dizziness, and confusion, are similar to symptoms of having too much  alcohol.  Do not drink alcohol if: ? Your health care provider tells you not to drink. ? You are pregnant, may be pregnant, or are planning to become pregnant.  If you drink alcohol: ? Do not drink on an empty stomach. ? Limit how much you use to:  0-1 drink a day for women.  0-2 drinks a day for men. ? Be aware of how much alcohol is in your drink. In the [place] one drink equals one 12 oz bottle of beer (355 mL), one 5 oz glass of wine (148 mL), or one 1 oz glass of hard liquor (44 mL). ? Keep yourself hydrated with water, diet soda, or unsweetened iced tea.  Keep in mind that regular soda, juice, and other mixers may contain a lot of sugar and must be counted as carbs. What are tips for following this plan? Reading food labels  Start by checking the serving size on the "Nutrition Facts" label of packaged foods and drinks. The amount of calories, carbs, fats, and other nutrients listed on the label is based on one serving of the item. Many items contain more than one serving per package.  Check the total grams (g) of carbs in one serving. You can calculate the number of servings of carbs in one serving by dividing the total carbs by 15. For example, if a food has 30 g of total carbs per serving, it would be equal to 2 servings of carbs.  Check the number of grams (g) of saturated fats and trans fats in one serving. Choose foods that have   a low amount or none of these fats.  Check the number of milligrams (mg) of salt (sodium) in one serving. Most people should limit total sodium intake to less than 2,300 mg per day.  Always check the nutrition information of foods labeled as "low-fat" or "nonfat." These foods may be higher in added sugar or refined carbs and should be avoided.  Talk to your dietitian to identify your daily goals for nutrients listed on the label. Shopping  Avoid buying canned, pre-made, or processed foods. These foods tend to be high in fat, sodium, and added  sugar.  Shop around the outside edge of the grocery store. This is where you will most often find fresh fruits and vegetables, bulk grains, fresh meats, and fresh dairy. Cooking  Use low-heat cooking methods, such as baking, instead of high-heat cooking methods like deep frying.  Cook using healthy oils, such as olive, canola, or sunflower oil.  Avoid cooking with butter, cream, or high-fat meats. Meal planning  Eat meals and snacks regularly, preferably at the same times every day. Avoid going long periods of time without eating.  Eat foods that are high in fiber, such as fresh fruits, vegetables, beans, and whole grains. Talk with your dietitian about how many servings of carbs you can eat at each meal.  Eat 4-6 oz (112-168 g) of lean protein each day, such as lean meat, chicken, fish, eggs, or tofu. One ounce (oz) of lean protein is equal to: ? 1 oz (28 g) of meat, chicken, or fish. ? 1 egg. ?  cup (62 g) of tofu.  Eat some foods each day that contain healthy fats, such as avocado, nuts, seeds, and fish.   What foods should I eat? Fruits Berries. Apples. Oranges. Peaches. Apricots. Plums. Grapes. Mango. Papaya. Pomegranate. Kiwi. Cherries. Vegetables Lettuce. Spinach. Leafy greens, including kale, chard, collard greens, and mustard greens. Beets. Cauliflower. Cabbage. Broccoli. Carrots. Green beans. Tomatoes. Peppers. Onions. Cucumbers. Brussels sprouts. Grains Whole grains, such as whole-wheat or whole-grain bread, crackers, tortillas, cereal, and pasta. Unsweetened oatmeal. Quinoa. Brown or wild rice. Meats and other proteins Seafood. Poultry without skin. Lean cuts of poultry and beef. Tofu. Nuts. Seeds. Dairy Low-fat or fat-free dairy products such as milk, yogurt, and cheese. The items listed above may not be a complete list of foods and beverages you can eat. Contact a dietitian for more information. What foods should I avoid? Fruits Fruits canned with  syrup. Vegetables Canned vegetables. Frozen vegetables with butter or cream sauce. Grains Refined white flour and flour products such as bread, pasta, snack foods, and cereals. Avoid all processed foods. Meats and other proteins Fatty cuts of meat. Poultry with skin. Breaded or fried meats. Processed meat. Avoid saturated fats. Dairy Full-fat yogurt, cheese, or milk. Beverages Sweetened drinks, such as soda or iced tea. The items listed above may not be a complete list of foods and beverages you should avoid. Contact a dietitian for more information. Questions to ask a health care provider  Do I need to meet with a diabetes educator?  Do I need to meet with a dietitian?  What number can I call if I have questions?  When are the best times to check my blood glucose? Where to find more information:  American Diabetes Association: diabetes.org  Academy of Nutrition and Dietetics: www.eatright.org  National Institute of Diabetes and Digestive and Kidney Diseases: www.niddk.nih.gov  Association of Diabetes Care and Education Specialists: www.diabeteseducator.org Summary  It is important to have healthy eating   habits because your blood sugar (glucose) levels are greatly affected by what you eat and drink.  A healthy meal plan will help you control your blood glucose and maintain a healthy lifestyle.  Your health care provider may recommend that you work with a dietitian to make a meal plan that is best for you.  Keep in mind that carbohydrates (carbs) and alcohol have immediate effects on your blood glucose levels. It is important to count carbs and to use alcohol carefully. This information is not intended to replace advice given to you by your health care provider. Make sure you discuss any questions you have with your health care provider. Document Revised: 03/30/2019 Document Reviewed: 03/30/2019 Elsevier Patient Education  2021 Elsevier Inc.  

## 2021-02-28 LAB — VITAMIN B12: Vitamin B-12: 809 pg/mL (ref 232–1245)

## 2021-02-28 NOTE — Progress Notes (Signed)
Contacted via [place] afternoon [place], your B12 level has returned and overall is stable.  Please continue supplement daily.  Any questions? Keep being amazing!!  Thank you for allowing me to participate in your care.  I appreciate you. Kindest regards, Dashonda Bonneau

## 2021-03-05 ENCOUNTER — Other Ambulatory Visit: Payer: Self-pay | Admitting: Nurse Practitioner

## 2021-03-05 DIAGNOSIS — I10 Essential (primary) hypertension: Secondary | ICD-10-CM

## 2021-03-05 NOTE — Telephone Encounter (Signed)
Requested Prescriptions  Pending Prescriptions Disp Refills  . carvedilol (COREG) 6.25 MG tablet [Pharmacy Med Name: Carvedilol 6.25 MG Oral Tablet] 180 tablet 1    Sig: TAKE 1 TABLET BY MOUTH  TWICE DAILY     Cardiovascular:  Beta Blockers Passed - 03/05/2021 10:40 PM      Passed - Last BP in normal range    BP Readings from Last 1 Encounters:  02/27/21 128/86         Passed - Last Heart Rate in normal range    Pulse Readings from Last 1 Encounters:  02/27/21 92         Passed - Valid encounter within last 6 months    Recent Outpatient Visits          6 days ago Diabetes mellitus due to underlying condition with diabetic nephropathy, without long-term current use of insulin ([place])   [place], Kensett T, NP   3 months ago Diabetes mellitus due to underlying condition with diabetic nephropathy, without long-term current use of insulin ([place])   [place], [place]inner T, NP   6 months ago Diabetes mellitus due to underlying condition with diabetic nephropathy, without long-term current use of insulin ([place])   [place], Jolene T, NP   9 months ago Diabetes mellitus due to underlying condition with diabetic nephropathy, without long-term current use of insulin ([place])   [place], [place] T, NP   1 year ago Diabetes mellitus due to underlying condition with diabetic nephropathy, without long-term current use of insulin ([place])   [place], [name], NP      Future Appointments            In 2 months [name], [name], NP [employer], [place]   In 6 months [name], MD [place]   In 105 months  [employer], [facility]

## 2021-03-21 ENCOUNTER — Ambulatory Visit (INDEPENDENT_AMBULATORY_CARE_PROVIDER_SITE_OTHER): Payer: Medicare Other

## 2021-03-21 ENCOUNTER — Telehealth: Payer: Medicare Other | Admitting: General Practice

## 2021-03-21 DIAGNOSIS — M0609 Rheumatoid arthritis without rheumatoid factor, multiple sites: Secondary | ICD-10-CM

## 2021-03-21 DIAGNOSIS — I152 Hypertension secondary to endocrine disorders: Secondary | ICD-10-CM

## 2021-03-21 DIAGNOSIS — E1169 Type 2 diabetes mellitus with other specified complication: Secondary | ICD-10-CM

## 2021-03-21 DIAGNOSIS — E1159 Type 2 diabetes mellitus with other circulatory complications: Secondary | ICD-10-CM

## 2021-03-21 DIAGNOSIS — E0821 Diabetes mellitus due to underlying condition with diabetic nephropathy: Secondary | ICD-10-CM

## 2021-03-21 NOTE — Patient Instructions (Signed)
Visit Information  RNCM Clinical Goal(s):  Patient will verbalize understanding of plan for management of HTN, HLD, DMII, and RA as evidenced by compliance with the plan of care, compliance with dietary restrictions, and working with the pcp and CCM team to optimize health and well being  demonstrate understanding of rationale for each prescribed medication as evidenced by compliance with medications and calling for refills before funning out of medications     attend all scheduled medical appointments: 06-01-2020 at 220 pm as evidenced by keeping appointments and calling for needed schedule changes         demonstrate improved and ongoing adherence to prescribed treatment plan for HTN, HLD, DMII, and RA as evidenced by taking blood sugars as directed, compliance with medications and dietary restrictions, and working with the CCM team to effectively manage health and well being  demonstrate a decrease in HTN, HLD, DMII, and RA exacerbations  as evidenced by calling for changes in condition and working with the CCM team and pcp to effectively manage chronic conditions  demonstrate ongoing self health care management ability for effective management of chronic conditions  as evidenced by working with the CCM team  through collaboration with Consulting civil engineer, provider, and care team.   Interventions: 1:1 collaboration with primary care provider regarding development and update of comprehensive plan of care as evidenced by provider attestation and co-signature Inter-disciplinary care team collaboration (see longitudinal plan of care) Evaluation of current treatment plan related to  self management and patient's adherence to plan as established by provider   Diabetes:  (Status: Goal on track: NO.) Long Term Goal   Lab Results  Component Value Date   HGBA1C 7.5 (H) 02/27/2021  Assessed patient's understanding of A1c goal: <7% Provided education to patient about basic DM disease process; Reviewed  medications with patient and discussed importance of medication adherence;        Reviewed prescribed diet with patient heart healthy/ADA diet. 03-21-2021: Reviewed to stay away from starchy foods- potatoes, breads, pastas.  The patient does not like sweets but does like breads. Education on monitoring dietary restrictions and eating foods like bread in moderation ; Counseled on importance of regular laboratory monitoring as prescribed;        Discussed plans with patient for ongoing care management follow up and provided patient with direct contact information for care management team;      Provided patient with written educational materials related to hypo and hyperglycemia and importance of correct treatment. 03-21-2021: The patient denies any lows       Reviewed scheduled/upcoming provider appointments including: 06-01-2021 at 220 pm;         Advised patient, providing education and rationale, to check cbg as directed and record. 03-21-2021: The patient has not been consistently checking blood sugars. Education on the benefits of checking blood sugars on a consistent basis. The patient states that he doesn't really know why he does not check. Review of goal of fasting blood sugars <130 and post prandial of <180. The patients last A1C trending down. The patient states he may have to add another medication to his regimen and will talk to the pcp about this in January.        call provider for findings outside established parameters;       Review of patient status, including review of consultants reports, relevant laboratory and other test results, and medications completed;       Advised patient to discuss medication options  with provider;  Screening for signs and symptoms of depression related to chronic disease state;        Assessed social determinant of health barriers;         Hyperlipidemia:  (Status: Goal on Track (progressing): YES.) Long Term Goal  Lab Results  Component Value Date    CHOL 104 11/27/2020   HDL 40 11/27/2020   LDLCALC 42 11/27/2020   TRIG 126 11/27/2020   CHOLHDL 2.5 03/12/2018     Medication review performed; medication list updated in electronic medical record.  Provider established cholesterol goals reviewed; Counseled on importance of regular laboratory monitoring as prescribed; Provided HLD educational materials; Reviewed role and benefits of statin for ASCVD risk reduction; Discussed strategies to manage statin-induced myalgias; Reviewed importance of limiting foods high in cholesterol;  Hypertension: (Status: Goal on Track (progressing): YES.) Last practice recorded BP readings:  BP Readings from Last 3 Encounters:  02/27/21 128/86  11/27/20 132/82  09/07/20 (!) 150/84  Most recent eGFR/CrCl:  Lab Results  Component Value Date   EGFR 89 11/27/2020    No components found for: CRCL  Evaluation of current treatment plan related to hypertension self management and patient's adherence to plan as established by provider;   Provided education to patient re: stroke prevention, s/s of heart attack and stroke; Reviewed prescribed diet heart healthy/ADA diet  Reviewed medications with patient and discussed importance of compliance;  Discussed plans with patient for ongoing care management follow up and provided patient with direct contact information for care management team; Advised patient, providing education and rationale, to monitor blood pressure daily and record, calling PCP for findings outside established parameters;  Provided education on prescribed diet heart healthy/ADA;  Discussed complications of poorly controlled blood pressure such as heart disease, stroke, circulatory complications, vision complications, kidney impairment, sexual dysfunction;   Pain:  (Status: Goal on Track (progressing): YES.) Long Term Goal  Pain assessment performed. 03-21-2021: The patient rates his RA pain at 0 today. Is thankful that the regimen he is on is  working well. He is in his shop working and happy.  Medications reviewed Reviewed provider established plan for pain management; Discussed importance of adherence to all scheduled medical appointments; Counseled on the importance of reporting any/all new or changed pain symptoms or management strategies to pain management provider; Advised patient to report to care team affect of pain on daily activities; Discussed use of relaxation techniques and/or diversional activities to assist with pain reduction (distraction, imagery, relaxation, massage, acupressure, TENS, heat, and cold application; Reviewed with patient prescribed pharmacological and nonpharmacological pain relief strategies; Advised patient to discuss uncontrolled pain, changes in level or intensity of pain  with provider;  Patient Goals/Self-Care Activities: Patient will self administer medications as prescribed as evidenced by self report/primary caregiver report  Patient will attend all scheduled provider appointments as evidenced by clinician review of documented attendance to scheduled appointments and patient/caregiver report Patient will call pharmacy for medication refills as evidenced by patient report and review of pharmacy fill history as appropriate Patient will attend church or other social activities as evidenced by patient report Patient will continue to perform ADL's independently as evidenced by patient/caregiver report Patient will continue to perform IADL's independently as evidenced by patient/caregiver report Patient will call provider office for new concerns or questions as evidenced by review of documented incoming telephone call notes and patient report Patient will work with BSW to address care coordination needs and will continue to work with the clinical team to address health care  and disease management related needs as evidenced by documented adherence to scheduled care management/care coordination  appointments schedule appointment with eye doctor check blood sugar at prescribed times: twice daily, when you have symptoms of low or high blood sugar, before and after exercise, and as directed by the provider. The patient has not been consistently taking blood sugars, reviewed the benefits of taking blood sugars on a regular basis  check feet daily for cuts, sores or redness enter blood sugar readings and medication or insulin into daily log take the blood sugar log to all doctor visits trim toenails straight across drink 6 to 8 glasses of water each day eat fish at least once per week fill half of plate with vegetables limit fast food meals to no more than 1 per week manage portion size prepare main meal at home 3 to 5 days each week read food labels for fat, fiber, carbohydrates and portion size reduce red meat to 2 to 3 times a week wash and dry feet carefully every day wear comfortable, cotton socks wear comfortable, well-fitting shoes - check blood pressure weekly - choose a place to take my blood pressure (home, clinic or office, retail store) - write blood pressure results in a log or diary - learn about high blood pressure - keep a blood pressure log - take blood pressure log to all doctor appointments - call doctor for signs and symptoms of high blood pressure - develop an action plan for high blood pressure - keep all doctor appointments - take medications for blood pressure exactly as prescribed - report new symptoms to your doctor - eat more whole grains, fruits and vegetables, lean meats and healthy fats - call for medicine refill 2 or 3 days before it runs out - take all medications exactly as prescribed - call doctor with any symptoms you believe are related to your medicine - call doctor when you experience any new symptoms - go to all doctor appointments as scheduled - adhere to prescribed diet: heart healthy/ADA    Patient verbalizes understanding of  instructions provided today and agrees to view in [place].   Telephone follow up appointment with care management team member scheduled for: 06-05-2021 at [place] am  Noreene Larsson RN, MSN, [facility] Mobile: [phone]

## 2021-03-21 NOTE — Chronic Care Management (AMB) (Signed)
Chronic Care Management   CCM RN Visit Note  03/21/2021 Name: Jason Contreras MRN: [id number] DOB: Dec 18, 1940  Subjective: Jason Contreras is a 80 y.o. year old male who is a primary care patient of [name], [name], NP. The care management team was consulted for assistance with disease management and care coordination needs.    Engaged with patient by telephone for follow up visit in response to provider referral for case management and/or care coordination services.   Consent to Services:  The patient was given information about Chronic Care Management services, agreed to services, and gave verbal consent prior to initiation of services.  Please see initial visit note for detailed documentation.   Patient agreed to services and verbal consent obtained.   Assessment: Review of patient past medical history, allergies, medications, health status, including review of consultants reports, laboratory and other test data, was performed as part of comprehensive evaluation and provision of chronic care management services.   SDOH (Social Determinants of Health) assessments and interventions performed:    CCM Care Plan  Allergies  Allergen Reactions   Morphine Nausea And Vomiting   Morphine And Related Nausea And Vomiting    Outpatient Encounter Medications as of 03/21/2021  Medication Sig   acetaminophen (TYLENOL) 325 MG tablet Take 650 mg by mouth every 6 (six) hours as needed. Takes at bedtime and occasionally in am for back/hip pain   atorvastatin (LIPITOR) 20 MG tablet TAKE 1 TABLET BY MOUTH  DAILY AT 6 PM.   B Complex-C-Folic Acid (SUPER B COMPLEX/FA/VIT C PO) Take 1 tablet by mouth daily. Contains 387 mcg folic acid   carvedilol (COREG) 6.25 MG tablet TAKE 1 TABLET BY MOUTH  TWICE DAILY   cholecalciferol (VITAMIN D3) 25 MCG (1000 UNIT) tablet Take 2,000 Units by mouth daily.    DULoxetine (CYMBALTA) 60 MG capsule Take 1 capsule (60 mg total) by mouth daily.   finasteride (PROSCAR) 5  MG tablet Take 1 tablet (5 mg total) by mouth daily.   folic acid (FOLVITE) 1 MG tablet Take 1 tablet (1 mg total) by mouth daily.   gabapentin (NEURONTIN) 600 MG tablet TAKE 1 TABLET BY MOUTH 3  TIMES DAILY   lidocaine (LMX) 4 % cream Apply 1 application topically as needed.   metFORMIN (GLUCOPHAGE) 500 MG tablet TAKE 2 TABLETS BY MOUTH  TWICE DAILY   methotrexate (RHEUMATREX) 2.5 MG tablet Take 2.5 mg by mouth once a week. 6 tabs weekly   mometasone (ELOCON) 0.1 % cream APPLY  CREAM TOPICALLY ONCE DAILY   tamsulosin (FLOMAX) 0.4 MG CAPS capsule TAKE 1 CAPSULE BY MOUTH  DAILY   vitamin C (ASCORBIC ACID) 500 MG tablet Take 500 mg by mouth daily.   No facility-administered encounter medications on file as of 03/21/2021.    Patient Active Problem List   Diagnosis Date Noted   Aortic atherosclerosis ([place]) 05/26/2020   Rheumatoid arthritis of multiple sites with negative rheumatoid factor ([place]) 12/08/2019   B12 deficiency 07/12/2019   Vitamin D deficiency 06/30/2019   Personal history of renal cancer 10/17/2017   Advanced care planning/counseling discussion 01/13/2017   Senile purpura ([place]) 09/26/2016   Elevated PSA 01/07/2015   BPH with obstruction/lower urinary tract symptoms 01/07/2015   DM due to underlying condition with diabetic nephropathy ([place])    Hypertension associated with diabetes ([place])    Hyperlipidemia associated with type 2 diabetes mellitus ([place])     Conditions to be addressed/monitored:HTN, HLD, DMII, and RA  Care Plan :  RNCM: Diabetes Type 2 (Adult)  Updates made by Vanita Ingles, RN since 03/21/2021 12:00 AM  Completed 03/21/2021   Problem: RNCM: Glycemic Management (Diabetes, Type 2) Resolved 03/21/2021  Priority: High     Long-Range Goal: RNCM: Glycemic Management Optimized Completed 03/21/2021  Start Date: 05/17/2020  Expected End Date: 11/02/2021  Recent Progress: On track  Priority: High  Note:   Objective: Duplicate goal, resolving Lab Results   Component Value Date   HGBA1C 7.8 (H) 11/27/2020     Lab Results  Component Value Date   CREATININE 0.88 11/27/2020   CREATININE 0.80 09/05/2020   CREATININE 0.82 06/02/2020     No results found for: EGFR Current Barriers:  Knowledge Deficits related to basic Diabetes pathophysiology and self care/management Knowledge Deficits related to medications used for management of diabetes Unable to independently manage DM Does not contact provider office for questions/concerns Case Manager Clinical Goal(s):  Collaboration with Venita Lick, NP regarding development and update of comprehensive plan of care as evidenced by provider attestation and co-signature Inter-disciplinary care team collaboration (see longitudinal plan of care) Over the next 120 days, patient will demonstrate improved adherence to prescribed treatment plan for diabetes self care/management as evidenced by:  daily monitoring and recording of CBG  adherence to ADA/ carb modified diet exercise 3/4 days/week adherence to prescribed medication regimen Interventions:  Provided education to patient about basic DM disease process.  07-12-2020: Review of heart healthy/ADA diet, medications compliance and overall DM health. The patient states he feels great.  Reviewed medications with patient and discussed importance of medication adherence. 07-12-2020: The patient ask about getting new script for his metformin 1052m tablets instead of 2- 500 mg tablets.  He wanted to know why his wife had the 10029mtablet and he had the 500 mg x 2.  Education and support given. Education on not getting the 500 mg tablets mixed up with the 1000 mg tablets. The patient verbalized understanding. Will send in basket message to the pcp about changing script at new refill for 1000 mg tablets to be dispensed. 10-17-2020 After reconsidering, Patient reports he did not want to change Metformin tablet from 500 mg tab to 1000 mg tablet.  He continues to be  careful and takes 2 tablets to equal 1000 mg. 01-16-2021: The patient states that he is taking his medications as directed. Denies any issues with compliance. Does not want to add additional medications for DM control, states he wants to work on dietary restrictions.  Discussed plans with patient for ongoing care management follow up and provided patient with direct contact information for care management team Provided patient with written educational materials related to hypo and hyperglycemia and importance of correct treatment.  01-16-2021: The patient states that he knows what to look for with changes in his blood sugars. Denies any lows or highs at this time.  Reviewed scheduled/upcoming provider appointments including: 02-27-2021 Advised patient, providing education and rationale, to check cbg BID and record, calling pcp for findings outside established parameters.  07-12-2020: The patient states that his blood sugars have been good except when he ate the dump cake earlier this week. States he keeps a consistent check on his blood sugars.  He is mindful of highs and lows. 10-17-2020: Patient checks blood glucose every morning with range of 140-170 lately.  Patient states he "eats what he wants to eat". He talked about his age and desiring to eat what he wants at this point in life.  Patient and  his wife are going on a trip out of state to grandson's wedding this weekend.  Patient has all his medications.  Reviewed all medications. Need to encourage heart healthy /ADA diet . 01-16-2021: The patients most recent A1C was 7.8. He admits he ate too many sweets at his grandsons wedding. He wants to get back on track. Fasting range has been 190 to 200. Reviewed the goal of fasting <130 and post prandial of <180. The patient verbalized understanding. Will continue to monitor.  Review of patient status, including review of consultants reports, relevant laboratory and other test results, and medications completed. -  barriers to adherence to treatment plan identified - blood glucose readings reviewed - resources required to improve adherence to care identified - self-awareness of signs/symptoms of hypo or hyperglycemia encouraged - use of blood glucose monitoring log promoted Patient Goals/Self-Care Activities patient will:  - UNABLE to independently manage DM Checks blood sugars as prescribed and utilize hyper and hypoglycemia protocol as needed Adheres to prescribed ADA/carb modified Follow Up Plan: Telephone follow up appointment with care management team member scheduled for: 03-21-2021 at 10:30 am    Care Plan : RNCM: Hypertension (Adult)  Updates made by Vanita Ingles, RN since 03/21/2021 12:00 AM  Completed 03/21/2021   Problem: RNCM: Hypertension (Hypertension) Resolved 03/21/2021  Priority: Medium     Long-Range Goal: RNCM: Hypertension Monitored Completed 03/21/2021  Start Date: 05/17/2020  Expected End Date: 11/02/2020  Recent Progress: On track  Priority: Medium  Note:   Objective: Resolving, duplicate goal  Last practice recorded BP readings:  BP Readings from Last 3 Encounters:  11/27/20 132/82  09/07/20 (!) 150/84  09/01/20 132/80    This SmartLink has not been configured with any valid records.     Most recent eGFR/CrCl: No results found for: EGFR  No components found for: CRCL Current Barriers:  Knowledge Deficits related to basic understanding of hypertension pathophysiology and self care management Knowledge Deficits related to understanding of medications prescribed for management of hypertension Unable to independently HTN Unable to self administer medications as prescribed Does not contact provider office for questions/concerns Case Manager Clinical Goal(s):  patient will verbalize understanding of plan for hypertension management patient will attend all scheduled medical appointments: 02-27-2021 at 340 pm patient will demonstrate improved adherence to prescribed  treatment plan for hypertension as evidenced by taking all medications as prescribed, monitoring and recording blood pressure as directed, adhering to low sodium/DASH diet  patient will demonstrate improved health management independence as evidenced by checking blood pressure as directed and notifying PCP if SBP>160 or DBP > 90, taking all medications as prescribe, and adhering to a low sodium diet as discussed. Interventions:  Collaboration with Venita Lick, NP regarding development and update of comprehensive plan of care as evidenced by provider attestation and co-signature Inter-disciplinary care team collaboration (see longitudinal plan of care) Evaluation of current treatment plan related to hypertension self management and patient's adherence to plan as established by provider. 01-16-2021: Denies any issues with HTN at this time. States his blood pressures have been good when taking at home. Did not have any current readings. States he and his wife take it one time a week at home.  Provided education to patient re: stroke prevention, s/s of heart attack and stroke, DASH diet, complications of uncontrolled blood pressure Reviewed medications with patient and discussed importance of compliance. 01-16-2021: Endorses compliance with medications.  Discussed plans with patient for ongoing care management follow up and provided patient with  direct contact information for care management team Advised patient, providing education and rationale, to monitor blood pressure daily and record, calling PCP for findings outside established parameters. 07-12-2020: The patient states that he has been having headaches and when he has headaches sometimes he has blood pressures that are elevated but not bad. The patient states he has always had headaches but some days are worse than others. Reminded patient to discuss this with the pcp on next visit for recommendations. 10-17-2020: Patient reports chronic headaches for  years.  He uses Tylenol PRN with good relief. 01-16-2021: Denies any new issues. Will continue to monitor. Reviewed scheduled/upcoming provider appointments including: 02-27-2021 at 340 pm Patient Goals/Self-Care Activities patient will:  - UNABLE to independently management HTN Calls provider office for new concerns, questions, or BP outside discussed parameters Checks BP and records as discussed Follows a low sodium diet/DASH diet - blood pressure trends reviewed - depression screen reviewed - home or ambulatory blood pressure monitoring encouraged Follow Up Plan: Telephone follow up appointment with care management team member scheduled for: 03-21-2021 at 10:30am    Care Plan : RNCM: Management of HLD  Updates made by Vanita Ingles, RN since 03/21/2021 12:00 AM  Completed 03/21/2021   Problem: RNCM: Management of HLD Resolved 03/21/2021  Priority: Medium     Long-Range Goal: RNCM:HLD  Self-Management Plan Developed Completed 03/21/2021  Start Date: 05/17/2020  Expected End Date: 11/02/2021  Recent Progress: On track  Priority: Medium  Note:   Current Barriers: Resolving, duplicate goal  Poorly controlled hyperlipidemia, complicated by DM/HTN Current antihyperlipidemic regimen: Lipitor 20 mg daily  Most recent lipid panel:  Lab Results  Component Value Date   CHOL 104 11/27/2020   CHOL 138 06/02/2020   CHOL 127 02/28/2020   Lab Results  Component Value Date   HDL 40 11/27/2020   HDL 44 06/02/2020   HDL 44 02/28/2020   Lab Results  Component Value Date   LDLCALC 42 11/27/2020   LDLCALC 67 06/02/2020   LDLCALC 58 02/28/2020   Lab Results  Component Value Date   TRIG 126 11/27/2020   TRIG 155 (H) 06/02/2020   TRIG 142 02/28/2020   Lab Results  Component Value Date   CHOLHDL 2.5 03/12/2018   CHOLHDL 2.7 01/13/2017   CHOLHDL 3.6 11/21/2014   No results found for: LDLDIRECT   ASCVD risk enhancing conditions: age >41, DM, HTN Unable to independently manage  HLD Does not contact provider office for questions/concerns  RN Care Manager Clinical Goal(s):   patient will work with Consulting civil engineer, providers, and care team towards execution of optimized self-health management plan patient will verbalize understanding of plan for HLD patient will work with [facility] and pcp to address needs related to HLD and effective management   patient will attend all scheduled medical appointments: 02-27-2021 at 340 pm  Interventions: Collaboration with Venita Lick, NP regarding development and update of comprehensive plan of care as evidenced by provider attestation and co-signature Inter-disciplinary care team collaboration (see longitudinal plan of care) Medication review performed; medication list updated in electronic medical record.  Inter-disciplinary care team collaboration (see longitudinal plan of care) Referred to pharmacy team for assistance with HLD medication management Evaluation of current treatment plan related to HLD and patient's adherence to plan as established by provider. 07-12-2020: The patient denies any new concerns with his HLD. States he is monitoring his dietary intake. Knows to call for changes in conditions or questions. 01-16-2021:  Patient continues to deny  any concerns or questions regarding his HLD. Advised patient to call the office for changes in condition or questions concerning chronic conditions  Provided education to patient re: heart healthy/ADA diet and hidden sodium in food content  Discussed plans with patient for ongoing care management follow up and provided patient with direct contact information for care management team Reviewed scheduled/upcoming provider appointments including: 02-27-2021 at 340 pm  Patient Goals/Self-Care Activities: patient will:   - call for medicine refill 2 or 3 days before it runs out - call if I am sick and can't take my medicine - keep a list of all the medicines I take; vitamins and herbals  too - learn to read medicine labels - drink 6 to 8 glasses of water each day - eat 3 to 5 servings of fruits and vegetables each day - eat 5 or 6 small meals each day - limit fast food meals to no more than 1 per week - manage portion size - prepare main meal at home 3 to 5 days each week - read food labels for fat, fiber, carbohydrates and portion size - be open to making changes - I can manage, know and watch for signs of a heart attack - if I have chest pain, call for help - learn about small changes that will make a big difference - learn my personal risk factors  - barriers to meeting goals identified - choices provided - collaboration with team encouraged - decision-making supported - health risks reviewed - questions answered - resources needed to meet goals identified - self-reflection promoted - self-reliance encouraged  Follow Up Plan: Telephone follow up appointment with care management team member scheduled for:03-21-2021 at 10:30am      Care Plan : RNCM: RA Management  Updates made by Vanita Ingles, RN since 03/21/2021 12:00 AM  Completed 03/21/2021   Problem: RNCM: Pain (RA) Resolved 03/21/2021  Priority: Medium     Long-Range Goal: RNCM: Manage Pain- RA Completed 03/21/2021  Start Date: 05/17/2020  Expected End Date: 11/02/2021  Recent Progress: On track  Priority: Medium  Note:   Current Barriers: Duplicate goal, resolving Knowledge Deficits related to managing acute/chronic pain Non-adherence to scheduled provider appointments Non-adherence to prescribed medication regimen Difficulty obtaining medications Chronic Disease Management support and education needs related to chronic pain Lacks social connections Does not contact provider office for questions/concerns Nurse Case Manager Clinical Goal(s):  patient will verbalize understanding of plan for managing pain patient will attend all scheduled medical appointments: 02-27-2021 patient will  demonstrate use of different relaxation  skills and/or diversional activities to assist with pain reduction (distraction, imagery, relaxation, massage, acupressure, TENS, heat, and cold application patient will report pain at a level less than 3 to 4 on a 10-10 rating scale patient will use pharmacological and nonpharmacological pain relief strategies patient will verbalize acceptable level of pain relief and ability to engage in desired activities patient will engage in desired activities without an increase in pain level Interventions:  Collaboration with Venita Lick, NP regarding development and update of comprehensive plan of care as evidenced by provider attestation and co-signature Inter-disciplinary care team collaboration (see longitudinal plan of care) - deep breathing, relaxation and mindfulness use promoted - medication-induced side effects managed - misuse of pain medication assessed. 01-16-2021:  Is compliant with medications  - motivation and barriers to change assessed and addressed - mutually acceptable comfort goal set - pain assessed. 01-16-2021: Rates pain level at a 3 today on a scale of  0-10. States it is in his left foot. States that it comes and goes and he randomly has it. Discussed other factors that may cause pain issue. States he will monitor for changes.  - pain treatment goals reviewed - premedication prior to activity encouraged Evaluation of current treatment plan related to RA and patient's adherence to plan as established by provider. 10-17-2020: Patient reports he is doing fairly well today overall with pain control.  Patient states he has chronic headaches for years.  He has mild headache this morning upon waking.  He uses Tylenol PRN with good relief. 01-16-2021: The patient is doing well with management of his RA. Is having left foot pain rates at a 3 today. States that this is something that comes and goes and he cannot correlate it to anything else.  He says  it will come and then leave and be gone 2 to 3 weeks and comes back randomly. Education on monitoring for changes in food choices when he notices the pain and to discuss with pcp at upcoming visit in October. The patient verbalized understanding. Will continue to monitor.  Advised patient to call the office for new questions or concerns  Provided education to patient re: alternative pain control methods  Reviewed medications with patient and discussed compliance. The patient was taken off of Leflunomide and started on Methotrexate. The patient states the Leflunomide was causing diarrhea. He has not had any issues with the Methotrexate. Will continue to monitor.  Discussed plans with patient for ongoing care management follow up and provided patient with direct contact information for care management team Allow patient to maintain a diary of pain ratings, timing, precipitating events, medications, treatments, and what works best to relieve pain,  Refer to support groups and self-help groups Educate patient about the use of pharmacological interventions for pain management- antianxiety, antidepressants, NSAIDS, opioid analgesics,  Explain the importance of lifestyle modifications to effective pain management  Patient Goals/Self Care Activities:  - healthy lifestyle promoted - medication side effects managed - multimodal pain management plan developed - pain assessed - response to pain management plan monitored - response to pharmacologic therapy monitored - support and encouragement provided - weight management encouraged Self-administers medications as prescribed Attends all scheduled provider appointments Calls pharmacy for medication refills Calls provider office for new concerns or questions Follow Up Plan: Telephone follow up appointment with care management team member scheduled for: 03-21-2021 at 10:30am      Care Plan : [facility]; General Plan of Care (Adult) for Chronic Disease Management  and Care Coordination Needs  Updates made by Vanita Ingles, RN since 03/21/2021 12:00 AM     Problem: RNCM: Development of Plan of Care for Chronic Disease Management (RA, HTN, HLD, DM)   Priority: High     Long-Range Goal: RNCM: Effective Management  of Plan of Care for Chronic Disease Management (RA, HTN, HLD, DM)   Start Date: 03/21/2021  Expected End Date: 03/21/2022  Priority: High  Note:   Current Barriers:  Knowledge Deficits related to plan of care for management of HTN, HLD, DMII, and RA  Chronic Disease Management support and education needs related to HTN, HLD, DMII, and RA  RNCM Clinical Goal(s):  Patient will verbalize understanding of plan for management of HTN, HLD, DMII, and RA as evidenced by compliance with the plan of care, compliance with dietary restrictions, and working with the pcp and CCM team to optimize health and well being  demonstrate understanding of rationale for each prescribed  medication as evidenced by compliance with medications and calling for refills before funning out of medications     attend all scheduled medical appointments: 06-01-2020 at 220 pm as evidenced by keeping appointments and calling for needed schedule changes         demonstrate improved and ongoing adherence to prescribed treatment plan for HTN, HLD, DMII, and RA as evidenced by taking blood sugars as directed, compliance with medications and dietary restrictions, and working with the CCM team to effectively manage health and well being  demonstrate a decrease in HTN, HLD, DMII, and RA exacerbations  as evidenced by calling for changes in condition and working with the CCM team and pcp to effectively manage chronic conditions  demonstrate ongoing self health care management ability for effective management of chronic conditions  as evidenced by working with the CCM team  through collaboration with Consulting civil engineer, provider, and care team.   Interventions: 1:1 collaboration with primary  care provider regarding development and update of comprehensive plan of care as evidenced by provider attestation and co-signature Inter-disciplinary care team collaboration (see longitudinal plan of care) Evaluation of current treatment plan related to  self management and patient's adherence to plan as established by provider   Diabetes:  (Status: Goal on track: NO.) Long Term Goal   Lab Results  Component Value Date   HGBA1C 7.5 (H) 02/27/2021  Assessed patient's understanding of A1c goal: <7% Provided education to patient about basic DM disease process; Reviewed medications with patient and discussed importance of medication adherence;        Reviewed prescribed diet with patient heart healthy/ADA diet. 03-21-2021: Reviewed to stay away from starchy foods- potatoes, breads, pastas.  The patient does not like sweets but does like breads. Education on monitoring dietary restrictions and eating foods like bread in moderation ; Counseled on importance of regular laboratory monitoring as prescribed;        Discussed plans with patient for ongoing care management follow up and provided patient with direct contact information for care management team;      Provided patient with written educational materials related to hypo and hyperglycemia and importance of correct treatment. 03-21-2021: The patient denies any lows       Reviewed scheduled/upcoming provider appointments including: 06-01-2021 at 220 pm;         Advised patient, providing education and rationale, to check cbg as directed and record. 03-21-2021: The patient has not been consistently checking blood sugars. Education on the benefits of checking blood sugars on a consistent basis. The patient states that he doesn't really know why he does not check. Review of goal of fasting blood sugars <130 and post prandial of <180. The patients last A1C trending down. The patient states he may have to add another medication to his regimen and will talk  to the pcp about this in January.        call provider for findings outside established parameters;       Review of patient status, including review of consultants reports, relevant laboratory and other test results, and medications completed;       Advised patient to discuss medication options  with provider;      Screening for signs and symptoms of depression related to chronic disease state;        Assessed social determinant of health barriers;         Hyperlipidemia:  (Status: Goal on Track (progressing): YES.) Long Term Goal  Lab Results  Component Value  Date   CHOL 104 11/27/2020   HDL 40 11/27/2020   LDLCALC 42 11/27/2020   TRIG 126 11/27/2020   CHOLHDL 2.5 03/12/2018     Medication review performed; medication list updated in electronic medical record.  Provider established cholesterol goals reviewed; Counseled on importance of regular laboratory monitoring as prescribed; Provided HLD educational materials; Reviewed role and benefits of statin for ASCVD risk reduction; Discussed strategies to manage statin-induced myalgias; Reviewed importance of limiting foods high in cholesterol;  Hypertension: (Status: Goal on Track (progressing): YES.) Last practice recorded BP readings:  BP Readings from Last 3 Encounters:  02/27/21 128/86  11/27/20 132/82  09/07/20 (!) 150/84  Most recent eGFR/CrCl:  Lab Results  Component Value Date   EGFR 89 11/27/2020    No components found for: CRCL  Evaluation of current treatment plan related to hypertension self management and patient's adherence to plan as established by provider;   Provided education to patient re: stroke prevention, s/s of heart attack and stroke; Reviewed prescribed diet heart healthy/ADA diet  Reviewed medications with patient and discussed importance of compliance;  Discussed plans with patient for ongoing care management follow up and provided patient with direct contact information for care management  team; Advised patient, providing education and rationale, to monitor blood pressure daily and record, calling PCP for findings outside established parameters;  Provided education on prescribed diet heart healthy/ADA;  Discussed complications of poorly controlled blood pressure such as heart disease, stroke, circulatory complications, vision complications, kidney impairment, sexual dysfunction;   Pain:  (Status: Goal on Track (progressing): YES.) Long Term Goal  Pain assessment performed. 03-21-2021: The patient rates his RA pain at 0 today. Is thankful that the regimen he is on is working well. He is in his shop working and happy.  Medications reviewed Reviewed provider established plan for pain management; Discussed importance of adherence to all scheduled medical appointments; Counseled on the importance of reporting any/all new or changed pain symptoms or management strategies to pain management provider; Advised patient to report to care team affect of pain on daily activities; Discussed use of relaxation techniques and/or diversional activities to assist with pain reduction (distraction, imagery, relaxation, massage, acupressure, TENS, heat, and cold application; Reviewed with patient prescribed pharmacological and nonpharmacological pain relief strategies; Advised patient to discuss uncontrolled pain, changes in level or intensity of pain  with provider;  Patient Goals/Self-Care Activities: Patient will self administer medications as prescribed as evidenced by self report/primary caregiver report  Patient will attend all scheduled provider appointments as evidenced by clinician review of documented attendance to scheduled appointments and patient/caregiver report Patient will call pharmacy for medication refills as evidenced by patient report and review of pharmacy fill history as appropriate Patient will attend church or other social activities as evidenced by patient report Patient will  continue to perform ADL's independently as evidenced by patient/caregiver report Patient will continue to perform IADL's independently as evidenced by patient/caregiver report Patient will call provider office for new concerns or questions as evidenced by review of documented incoming telephone call notes and patient report Patient will work with BSW to address care coordination needs and will continue to work with the clinical team to address health care and disease management related needs as evidenced by documented adherence to scheduled care management/care coordination appointments schedule appointment with eye doctor check blood sugar at prescribed times: twice daily, when you have symptoms of low or high blood sugar, before and after exercise, and as directed by the provider. The  patient has not been consistently taking blood sugars, reviewed the benefits of taking blood sugars on a regular basis  check feet daily for cuts, sores or redness enter blood sugar readings and medication or insulin into daily log take the blood sugar log to all doctor visits trim toenails straight across drink 6 to 8 glasses of water each day eat fish at least once per week fill half of plate with vegetables limit fast food meals to no more than 1 per week manage portion size prepare main meal at home 3 to 5 days each week read food labels for fat, fiber, carbohydrates and portion size reduce red meat to 2 to 3 times a week wash and dry feet carefully every day wear comfortable, cotton socks wear comfortable, well-fitting shoes - check blood pressure weekly - choose a place to take my blood pressure (home, clinic or office, retail store) - write blood pressure results in a log or diary - learn about high blood pressure - keep a blood pressure log - take blood pressure log to all doctor appointments - call doctor for signs and symptoms of high blood pressure - develop an action plan for high blood  pressure - keep all doctor appointments - take medications for blood pressure exactly as prescribed - report new symptoms to your doctor - eat more whole grains, fruits and vegetables, lean meats and healthy fats - call for medicine refill 2 or 3 days before it runs out - take all medications exactly as prescribed - call doctor with any symptoms you believe are related to your medicine - call doctor when you experience any new symptoms - go to all doctor appointments as scheduled - adhere to prescribed diet: heart healthy/ADA       Plan:Telephone follow up appointment with care management team member scheduled for:  06-05-2021 at [place] am  Noreene Larsson RN, MSN, [facility] Mobile: [phone]

## 2021-03-22 DIAGNOSIS — Z79899 Other long term (current) drug therapy: Secondary | ICD-10-CM | POA: Diagnosis not present

## 2021-03-22 DIAGNOSIS — M159 Polyosteoarthritis, unspecified: Secondary | ICD-10-CM | POA: Diagnosis not present

## 2021-03-22 DIAGNOSIS — M0609 Rheumatoid arthritis without rheumatoid factor, multiple sites: Secondary | ICD-10-CM | POA: Diagnosis not present

## 2021-04-04 DIAGNOSIS — I152 Hypertension secondary to endocrine disorders: Secondary | ICD-10-CM | POA: Diagnosis not present

## 2021-04-04 DIAGNOSIS — E1169 Type 2 diabetes mellitus with other specified complication: Secondary | ICD-10-CM

## 2021-04-04 DIAGNOSIS — M0609 Rheumatoid arthritis without rheumatoid factor, multiple sites: Secondary | ICD-10-CM

## 2021-04-04 DIAGNOSIS — E0821 Diabetes mellitus due to underlying condition with diabetic nephropathy: Secondary | ICD-10-CM

## 2021-04-04 DIAGNOSIS — E1159 Type 2 diabetes mellitus with other circulatory complications: Secondary | ICD-10-CM

## 2021-04-04 DIAGNOSIS — E785 Hyperlipidemia, unspecified: Secondary | ICD-10-CM

## 2021-04-11 ENCOUNTER — Telehealth: Payer: Self-pay

## 2021-04-11 NOTE — Chronic Care Management (AMB) (Signed)
    Chronic Care Management Pharmacy Assistant   Name: Jason Contreras  MRN: [id number] DOB: 1940/10/26  Reason for Encounter: Disease State Diabetes Mellitus  Recent office visits:  02/27/21-Jason T. Ned Card, NP (PCP) General follow up visit. Labs ordered. Follow up in 3 months. Flu vaccine given Follow up in 3 months. 11/27/20-Jason T. Ned Card, NP (PCP) General follow up visit. Labs ordered. Follow up in 3 months.  Recent consult visits:  11/14/20-Jason Contreras (Rheumatology) Seen for rheumatoid arthritis. Labs ordered. Follow up in 4 months.  Hospital visits:  None in previous 6 months  Medications: Outpatient Encounter Medications as of 04/11/2021  Medication Sig   acetaminophen (TYLENOL) 325 MG tablet Take 650 mg by mouth every 6 (six) hours as needed. Takes at bedtime and occasionally in am for back/hip pain   atorvastatin (LIPITOR) 20 MG tablet TAKE 1 TABLET BY MOUTH  DAILY AT 6 PM.   B Complex-C-Folic Acid (SUPER B COMPLEX/FA/VIT C PO) Take 1 tablet by mouth daily. Contains 825 mcg folic acid   carvedilol (COREG) 6.25 MG tablet TAKE 1 TABLET BY MOUTH  TWICE DAILY   cholecalciferol (VITAMIN D3) 25 MCG (1000 UNIT) tablet Take 2,000 Units by mouth daily.    DULoxetine (CYMBALTA) 60 MG capsule Take 1 capsule (60 mg total) by mouth daily.   finasteride (PROSCAR) 5 MG tablet Take 1 tablet (5 mg total) by mouth daily.   folic acid (FOLVITE) 1 MG tablet Take 1 tablet (1 mg total) by mouth daily.   gabapentin (NEURONTIN) 600 MG tablet TAKE 1 TABLET BY MOUTH 3  TIMES DAILY   lidocaine (LMX) 4 % cream Apply 1 application topically as needed.   metFORMIN (GLUCOPHAGE) 500 MG tablet TAKE 2 TABLETS BY MOUTH  TWICE DAILY   methotrexate (RHEUMATREX) 2.5 MG tablet Take 2.5 mg by mouth once a week. 6 tabs weekly   mometasone (ELOCON) 0.1 % cream APPLY  CREAM TOPICALLY ONCE DAILY   tamsulosin (FLOMAX) 0.4 MG CAPS capsule TAKE 1 CAPSULE BY MOUTH  DAILY   vitamin C (ASCORBIC ACID) 500 MG  tablet Take 500 mg by mouth daily.   No facility-administered encounter medications on file as of 04/11/2021.   Current antihyperglycemic regimen:  Metformin 500 mg TAKE 2 TABLETS BY MOUTH TWICE DAILY   Adherence Review: Is the patient currently on a STATIN medication? Yes Is the patient currently on ACE/ARB medication? No Does the patient have >5 day gap between last estimated fill dates? No  Unsuccessful out reach to complete assessment call. I have left 3 voicemail's to the patient to return phone call as well.  Care Gaps: Zoster Vaccines- Shingrix:Overdue - never done OPHTHALMOLOGY EXAM:Last completed: May 03, 2016 COVID-19 Vaccine:Last completed: Nov 28, 2020  Star Rating Drugs: Atorvastatin 20 mg Last filled:01/09/21 90 DS Metformin 500 mg Last filled:12/27/20 90 DS  Jason Contreras, [place]

## 2021-04-12 ENCOUNTER — Other Ambulatory Visit: Payer: Self-pay | Admitting: Urology

## 2021-04-27 DIAGNOSIS — H2513 Age-related nuclear cataract, bilateral: Secondary | ICD-10-CM | POA: Diagnosis not present

## 2021-04-27 LAB — HM DIABETES EYE EXAM

## 2021-05-04 ENCOUNTER — Other Ambulatory Visit: Payer: Self-pay | Admitting: Nurse Practitioner

## 2021-05-04 NOTE — Telephone Encounter (Signed)
Requested Prescriptions  Pending Prescriptions Disp Refills   DULoxetine (CYMBALTA) 60 MG capsule [Pharmacy Med Name: DULoxetine HCl 60 MG Oral Capsule Delayed Release Particles] 90 capsule 0    Sig: TAKE 1 CAPSULE BY MOUTH  DAILY     Psychiatry: Antidepressants - SNRI Passed - 05/04/2021 12:10 AM      Passed - Last BP in normal range    BP Readings from Last 1 Encounters:  02/27/21 128/86         Passed - Valid encounter within last 6 months    Recent Outpatient Visits          2 months ago Diabetes mellitus due to underlying condition with diabetic nephropathy, without long-term current use of insulin ([place])   [place], [place] T, NP   5 months ago Diabetes mellitus due to underlying condition with diabetic nephropathy, without long-term current use of insulin ([place])   [place], [place] T, NP   8 months ago Diabetes mellitus due to underlying condition with diabetic nephropathy, without long-term current use of insulin ([place])   [place], Jolene T, NP   11 months ago Diabetes mellitus due to underlying condition with diabetic nephropathy, without long-term current use of insulin ([place])   [place], [place]il[place] T, NP   1 year ago Diabetes mellitus due to underlying condition with diabetic nephropathy, without long-term current use of insulin ([place])   [place], [name], NP      Future Appointments            In 4 weeks [name], [name], NP [facility], [place]   In 4 months [name], MD [place]   In 9 months  [facility], [facility]

## 2021-05-17 ENCOUNTER — Other Ambulatory Visit: Payer: Self-pay | Admitting: Nurse Practitioner

## 2021-05-17 DIAGNOSIS — E0821 Diabetes mellitus due to underlying condition with diabetic nephropathy: Secondary | ICD-10-CM

## 2021-05-17 NOTE — Telephone Encounter (Signed)
Requested Prescriptions  Pending Prescriptions Disp Refills   metFORMIN (GLUCOPHAGE) 500 MG tablet [Pharmacy Med Name: metFORMIN HCl 500 MG Oral Tablet] 360 tablet 0    Sig: TAKE 2 TABLETS BY MOUTH  TWICE DAILY     Endocrinology:  Diabetes - Biguanides Passed - 05/17/2021  5:35 AM      Passed - Cr in normal range and within 360 days    Creatinine, Ser  Date Value Ref Range Status  11/27/2020 0.88 0.76 - 1.27 mg/dL Final         Passed - HBA1C is between 0 and 7.9 and within 180 days    Hemoglobin A1C  Date Value Ref Range Status  12/27/2015 6.3  Final   HB A1C (BAYER DCA - WAIVED)  Date Value Ref Range Status  02/27/2021 7.5 (H) 4.8 - 5.6 % Final    Comment:             Prediabetes: 5.7 - 6.4          Diabetes: >6.4          Glycemic control for adults with diabetes: <7.0          Passed - eGFR in normal range and within 360 days    GFR calc Af Amer  Date Value Ref Range Status  06/02/2020 99 >59 mL/min/1.73 Final    Comment:    **In accordance with recommendations from the NKF-ASN Task force,**   [facility] is in the process of updating its eGFR calculation to the   2021 CKD-EPI creatinine equation that estimates kidney function   without a race variable.    GFR calc non Af Amer  Date Value Ref Range Status  06/02/2020 85 >59 mL/min/1.73 Final   eGFR  Date Value Ref Range Status  11/27/2020 89 >59 mL/min/1.73 Final         Passed - Valid encounter within last 6 months    Recent Outpatient Visits          2 months ago Diabetes mellitus due to underlying condition with diabetic nephropathy, without long-term current use of insulin ([place])   [place], [place] T, NP   5 months ago Diabetes mellitus due to underlying condition with diabetic nephropathy, without long-term current use of insulin ([place])   [place], Rosita T, NP   8 months ago Diabetes mellitus due to underlying condition with diabetic nephropathy, without  long-term current use of insulin ([place])   [place], Jolene T, NP   11 months ago Diabetes mellitus due to underlying condition with diabetic nephropathy, without long-term current use of insulin ([place])   [place], [place] T, NP   1 year ago Diabetes mellitus due to underlying condition with diabetic nephropathy, without long-term current use of insulin ([place])   [place], [name], NP      Future Appointments            In 2 weeks [name], [name], NP [facility], [place]   In 3 months [name], MD [place]   In 8 months  [facility], [facility]

## 2021-05-30 ENCOUNTER — Encounter: Payer: Medicare Other | Admitting: Nurse Practitioner

## 2021-06-01 ENCOUNTER — Encounter: Payer: Medicare Other | Admitting: Nurse Practitioner

## 2021-06-01 NOTE — Patient Instructions (Signed)

## 2021-06-04 ENCOUNTER — Encounter: Payer: Self-pay | Admitting: Nurse Practitioner

## 2021-06-04 ENCOUNTER — Ambulatory Visit (INDEPENDENT_AMBULATORY_CARE_PROVIDER_SITE_OTHER): Payer: Medicare Other | Admitting: Nurse Practitioner

## 2021-06-04 ENCOUNTER — Other Ambulatory Visit: Payer: Self-pay

## 2021-06-04 VITALS — BP 106/72 | HR 97 | Temp 98.4°F | Ht 68.25 in | Wt 200.2 lb

## 2021-06-04 DIAGNOSIS — E538 Deficiency of other specified B group vitamins: Secondary | ICD-10-CM

## 2021-06-04 DIAGNOSIS — D692 Other nonthrombocytopenic purpura: Secondary | ICD-10-CM | POA: Diagnosis not present

## 2021-06-04 DIAGNOSIS — E1169 Type 2 diabetes mellitus with other specified complication: Secondary | ICD-10-CM

## 2021-06-04 DIAGNOSIS — M0609 Rheumatoid arthritis without rheumatoid factor, multiple sites: Secondary | ICD-10-CM

## 2021-06-04 DIAGNOSIS — I700 Atherosclerosis of aorta: Secondary | ICD-10-CM | POA: Diagnosis not present

## 2021-06-04 DIAGNOSIS — E1159 Type 2 diabetes mellitus with other circulatory complications: Secondary | ICD-10-CM

## 2021-06-04 DIAGNOSIS — Z85528 Personal history of other malignant neoplasm of kidney: Secondary | ICD-10-CM

## 2021-06-04 DIAGNOSIS — E559 Vitamin D deficiency, unspecified: Secondary | ICD-10-CM

## 2021-06-04 DIAGNOSIS — E785 Hyperlipidemia, unspecified: Secondary | ICD-10-CM | POA: Diagnosis not present

## 2021-06-04 DIAGNOSIS — I152 Hypertension secondary to endocrine disorders: Secondary | ICD-10-CM

## 2021-06-04 DIAGNOSIS — E0821 Diabetes mellitus due to underlying condition with diabetic nephropathy: Secondary | ICD-10-CM

## 2021-06-04 DIAGNOSIS — Z0000 Encounter for general adult medical examination without abnormal findings: Secondary | ICD-10-CM

## 2021-06-04 DIAGNOSIS — R972 Elevated prostate specific antigen [PSA]: Secondary | ICD-10-CM

## 2021-06-04 DIAGNOSIS — N401 Enlarged prostate with lower urinary tract symptoms: Secondary | ICD-10-CM

## 2021-06-04 DIAGNOSIS — N138 Other obstructive and reflux uropathy: Secondary | ICD-10-CM

## 2021-06-04 LAB — BAYER DCA HB A1C WAIVED: HB A1C (BAYER DCA - WAIVED): 8.5 % — ABNORMAL HIGH (ref 4.8–5.6)

## 2021-06-04 LAB — MICROALBUMIN, URINE WAIVED
Creatinine, Urine Waived: 50 mg/dL (ref 10–300)
Microalb, Ur Waived: 10 mg/L (ref 0–19)

## 2021-06-04 MED ORDER — TRULICITY 0.75 MG/0.5ML ~~LOC~~ SOAJ: 0.7500 mg | SUBCUTANEOUS | 4 refills | Status: DC

## 2021-06-04 NOTE — Assessment & Plan Note (Signed)
Ongoing and improving with supplement with recent level at goal.  Recheck today and consider reducing or discontinuing supplement as needed.

## 2021-06-04 NOTE — Assessment & Plan Note (Signed)
Chronic, ongoing.  Continue current medication regimen and adjust as needed. Lipid panel today. 

## 2021-06-04 NOTE — Assessment & Plan Note (Signed)
Chronic, ongoing.  Followed by rheumatology with much improved functional status since initially meeting a couple years ago.  Continue this collaboration and current regimen as prescribed by them.  Recent note reviewed.

## 2021-06-04 NOTE — Progress Notes (Signed)
BP 106/72    Pulse 97    Temp 98.4 F (36.9 C) (Oral)    Ht 5' 8.25" (1.734 m)    Wt 200 lb 3.2 oz (90.8 kg)    SpO2 100%    BMI 30.22 kg/m    Subjective:    Patient ID: Jason Contreras, male    DOB: May 15, 1940, 81 y.o.   MRN: [id number]  HPI: Jason Contreras is a 81 y.o. male presenting on 06/04/2021 for comprehensive medical examination. Current medical complaints include:none  He currently lives with: wife Interim Problems from his last visit: no   DIABETES Continues on Metformin 1000 MG BID.  Recent A1c 7.5% in October -- he does endorse enjoying the holidays.  Continues on Gabapentin TID and B12 for neuropathy + Vitamin D for history of low levels. Hypoglycemic episodes:no Polydipsia/polyuria: no Visual disturbance: no Chest pain: no Paresthesias: no Glucose Monitoring: no  Accucheck frequency: not checking  Fasting glucose:   Post prandial:  Evening:  Before meals: Taking Insulin?: no  Long acting insulin:  Short acting insulin: Blood Pressure Monitoring: a few times a week Retinal Examination: Not up to Date Foot Exam: Up to Date Pneumovax: Not up to Date Influenza: Up to Date Aspirin: no   HYPERTENSION / HYPERLIPIDEMIA Continues on Atorvastatin 20 MG and Coreg 6.25 MG BID.  Has history of aortic atherosclerosis noted on CXR 07/20/19. Satisfied with current treatment? yes Duration of hypertension: chronic BP monitoring frequency: a few times a week BP range:  BP medication side effects: no Duration of hyperlipidemia: chronic Cholesterol medication side effects: no Cholesterol supplements: none Medication compliance: good compliance Aspirin: no Recent stressors: no Recurrent headaches: no Visual changes: no Palpitations: no Dyspnea: no Chest pain: no Lower extremity edema: no Dizzy/lightheaded: no  Current CrCl --- 57.64   RHEUMATOID ARTHRITIS He is currently followed by rheumatology for RA of multiple sites with negative Rf. Last visit 03/22/21.   Overall has been much improved since taking Methotrexate. Pain control status: improved Duration: chronic Location: multiple areas What Activities task can be accomplished with current medication? ADL's daily Previous pain specialty evaluation: yes Non-narcotic analgesic meds: yes   ELEVATED PSA: Being followed by urology.  Last PSA 4.0 and last visit with urology on 09/07/20. Had negative biopsy.  Has history of kidney cancer to right side, had two operations on this.  Continues on Tamsulosin and Finasteride.   Functional Status Survey: Is the patient deaf or have difficulty hearing?: No Does the patient have difficulty seeing, even when wearing glasses/contacts?: No Does the patient have difficulty concentrating, remembering, or making decisions?: No Does the patient have difficulty walking or climbing stairs?: No Does the patient have difficulty dressing or bathing?: No Does the patient have difficulty doing errands alone such as visiting a doctor's office or shopping?: No  FALL RISK: Fall Risk  06/04/2021 06/04/2021 02/02/2021 06/02/2020 01/31/2020  Falls in the past year? 0 0 0 0 0  Number falls in past yr: 0 0 - - -  Injury with Fall? 0 0 - - -  Risk for fall due to : No Fall Risks No Fall Risks Medication side effect - Medication side effect  Follow up Falls evaluation completed Falls evaluation completed Falls evaluation completed;Education provided;Falls prevention discussed - Falls evaluation completed;Education provided;Falls prevention discussed    Depression Screen Depression screen [facility] 2/9 06/04/2021 02/02/2021 01/16/2021 06/02/2020 01/31/2020  Decreased Interest 0 0 0 0 0  Down, Depressed, Hopeless 0 0 0 0 0  PHQ - 2 Score 0 0 0 0 0  Altered sleeping 0 - - 0 -  Tired, decreased energy 0 - - 0 -  Change in appetite 0 - - 0 -  Feeling bad or failure about yourself  0 - - 0 -  Trouble concentrating 0 - - 0 -  Moving slowly or fidgety/restless 0 - - 0 -  Suicidal thoughts 0 - -  0 -  PHQ-9 Score 0 - - 0 -  Difficult doing work/chores - - - - -    Advanced Directives <no information>  Past Medical History:  Past Medical History:  Diagnosis Date   Adenocarcinoma, renal cell (HCC)    Arthritis    Collagen vascular disease ([place])    Diabetes mellitus without complication (HCC)    Elevated PSA    Headache    Hematuria    Hyperlipidemia    Hypertension    Neuropathy    Obesity    PONV (postoperative nausea and vomiting)    Renal insufficiency    Right renal mass     Surgical History:  Past Surgical History:  Procedure Laterality Date   APPENDECTOMY     CRYOABLATION  10/17/2017   IR RADIOLOGIST EVAL & MGMT  07/17/2017   IR RADIOLOGIST EVAL & MGMT  08/13/2017   IR RADIOLOGIST EVAL & MGMT  11/12/2017   IR RADIOLOGIST EVAL & MGMT  03/05/2018   RADIOLOGY WITH ANESTHESIA Right 10/17/2017   Procedure: CT WITH ANESTHESIA RENAL CRYOABLATION;  Surgeon: [name], MD;  Location: [facility];  Service: Anesthesiology;  Laterality: Right;   ROBOTIC ASSITED PARTIAL NEPHRECTOMY Right 12/12/2015   Procedure: ROBOTIC ASSITED PARTIAL NEPHRECTOMY;  Surgeon: [name], MD;  Location: [facility] ORS;  Service: Urology;  Laterality: Right;    Medications:  Current Outpatient Medications on File Prior to Visit  Medication Sig   acetaminophen (TYLENOL) 325 MG tablet Take 650 mg by mouth every 6 (six) hours as needed. Takes at bedtime and occasionally in am for back/hip pain   atorvastatin (LIPITOR) 20 MG tablet TAKE 1 TABLET BY MOUTH  DAILY AT 6 PM.   B Complex-C-Folic Acid (SUPER B COMPLEX/FA/VIT C PO) Take 1 tablet by mouth daily. Contains 735 mcg folic acid   carvedilol (COREG) 6.25 MG tablet TAKE 1 TABLET BY MOUTH  TWICE DAILY   cholecalciferol (VITAMIN D3) 25 MCG (1000 UNIT) tablet Take 2,000 Units by mouth daily.    DULoxetine (CYMBALTA) 60 MG capsule TAKE 1 CAPSULE BY MOUTH  DAILY   finasteride (PROSCAR) 5 MG tablet Take 1 tablet (5 mg total) by mouth daily.   folic  acid (FOLVITE) 1 MG tablet Take 1 tablet (1 mg total) by mouth daily.   gabapentin (NEURONTIN) 600 MG tablet TAKE 1 TABLET BY MOUTH 3  TIMES DAILY   lidocaine (LMX) 4 % cream Apply 1 application topically as needed.   metFORMIN (GLUCOPHAGE) 500 MG tablet TAKE 2 TABLETS BY MOUTH  TWICE DAILY   methotrexate (RHEUMATREX) 2.5 MG tablet Take 2.5 mg by mouth once a week. 6 tabs weekly   mometasone (ELOCON) 0.1 % cream APPLY  CREAM TOPICALLY ONCE DAILY   tamsulosin (FLOMAX) 0.4 MG CAPS capsule TAKE 1 CAPSULE BY MOUTH  DAILY   vitamin C (ASCORBIC ACID) 500 MG tablet Take 500 mg by mouth daily.   No current facility-administered medications on file prior to visit.    Allergies:  Allergies  Allergen Reactions   Morphine Nausea And Vomiting   Morphine And  Related Nausea And Vomiting    Social History:  Social History   Socioeconomic History   Marital status: Married    Spouse name: Not on file   Number of children: Not on file   Years of education: 3 years college    Highest education level: Some college, no degree  Occupational History   Not on file  Tobacco Use   Smoking status: Never   Smokeless tobacco: Never  Vaping Use   Vaping Use: Never used  Substance and Sexual Activity   Alcohol use: No   Drug use: No   Sexual activity: Not Currently  Other Topics Concern   Not on file  Social History Narrative   Not on file   Social Determinants of Health   Financial Resource Strain: Low Risk    Difficulty of Paying Living Expenses: Not hard at all  Food Insecurity: No Food Insecurity   Worried About Charity fundraiser in the Last Year: Never true   [place] in the Last Year: Never true  Transportation Needs: No Transportation Needs   Lack of Transportation (Medical): No   Lack of Transportation (Non-Medical): No  Physical Activity: Inactive   Days of Exercise per Week: 0 days   Minutes of Exercise per Session: 0 min  Stress: No Stress Concern Present   Feeling of  Stress : Not at all  Social Connections: Moderately Integrated   Frequency of Communication with Friends and Family: More than three times a week   Frequency of Social Gatherings with Friends and Family: More than three times a week   Attends Religious Services: More than 4 times per year   Active Member of [employer] or Organizations: No   Attends Music therapist: Never   Marital Status: Married  Human resources officer Violence: Not At Risk   Fear of Current or Ex-Partner: No   Emotionally Abused: No   Physically Abused: No   Sexually Abused: No   Social History   Tobacco Use  Smoking Status Never  Smokeless Tobacco Never   Social History   Substance and Sexual Activity  Alcohol Use No    Family History:  Family History  Problem Relation Age of Onset   Bone cancer Mother    Cancer Mother    Cancer Father    Stroke Brother    Kidney disease Neg Hx     Past medical history, surgical history, medications, allergies, family history and social history reviewed with patient today and changes made to appropriate areas of the chart.   Review of Systems - negative All other ROS negative except what is listed above and in the HPI.      Objective:    BP 106/72    Pulse 97    Temp 98.4 F (36.9 C) (Oral)    Ht 5' 8.25" (1.734 m)    Wt 200 lb 3.2 oz (90.8 kg)    SpO2 100%    BMI 30.22 kg/m   Wt Readings from Last 3 Encounters:  06/04/21 200 lb 3.2 oz (90.8 kg)  02/27/21 203 lb 3.2 oz (92.2 kg)  02/02/21 205 lb (93 kg)    Physical Exam Vitals and nursing note reviewed.  Constitutional:      General: He is awake. He is not in acute distress.    Appearance: He is well-developed and overweight. He is not ill-appearing.  HENT:     Head: Normocephalic and atraumatic.     Right Ear: Hearing, tympanic  membrane, ear canal and external ear normal. No drainage.     Left Ear: Hearing, tympanic membrane, ear canal and external ear normal. No drainage.     Nose: Nose normal.      Mouth/Throat:     Pharynx: Uvula midline.  Eyes:     General: Lids are normal.        Right eye: No discharge.        Left eye: No discharge.     Extraocular Movements: Extraocular movements intact.     Conjunctiva/sclera: Conjunctivae normal.     Pupils: Pupils are equal, round, and reactive to light.     Visual Fields: Right eye visual fields normal and left eye visual fields normal.  Neck:     Thyroid: No thyromegaly.     Vascular: No carotid bruit or JVD.     Trachea: Trachea normal.  Cardiovascular:     Rate and Rhythm: Normal rate and regular rhythm.     Heart sounds: Normal heart sounds, S1 normal and S2 normal. No murmur heard.   No gallop.  Pulmonary:     Effort: Pulmonary effort is normal. No accessory muscle usage or respiratory distress.     Breath sounds: Normal breath sounds.  Abdominal:     General: Bowel sounds are normal.     Palpations: Abdomen is soft. There is no hepatomegaly or splenomegaly.     Tenderness: There is no abdominal tenderness.  Genitourinary:    Comments: Deferred per patient request. Musculoskeletal:        General: Normal range of motion.     Cervical back: Normal range of motion and neck supple.     Right lower leg: No edema.     Left lower leg: No edema.  Lymphadenopathy:     Head:     Right side of head: No submental, submandibular, tonsillar, preauricular or posterior auricular adenopathy.     Left side of head: No submental, submandibular, tonsillar, preauricular or posterior auricular adenopathy.     Cervical: No cervical adenopathy.  Skin:    General: Skin is warm and dry.     Capillary Refill: Capillary refill takes less than 2 seconds.     Findings: No rash.     Comments: A few scattered pale purple bruises to bilateral upper extremity.  Neurological:     Mental Status: He is alert and oriented to person, place, and time.     Gait: Gait is intact.     Deep Tendon Reflexes: Reflexes are normal and symmetric.     Reflex  Scores:      Brachioradialis reflexes are 2+ on the right side and 2+ on the left side.      Patellar reflexes are 2+ on the right side and 2+ on the left side. Psychiatric:        Attention and Perception: Attention normal.        Mood and Affect: Mood normal.        Speech: Speech normal.        Behavior: Behavior normal. Behavior is cooperative.        Thought Content: Thought content normal.        Cognition and Memory: Cognition normal.        Judgment: Judgment normal.    Diabetic Foot Exam - Simple   Simple Foot Form Visual Inspection No deformities, no ulcerations, no other skin breakdown bilaterally: Yes Sensation Testing Intact to touch and monofilament testing bilaterally: Yes Pulse Check Posterior Tibialis  and Dorsalis pulse intact bilaterally: Yes Comments    Results for orders placed or performed in visit on 05/29/21  HM DIABETES EYE EXAM  Result Value Ref Range   HM Diabetic Eye Exam No Retinopathy No Retinopathy      Assessment & Plan:   Problem List Items Addressed This Visit       Cardiovascular and Mediastinum   Aortic atherosclerosis ([place])    Noted on CXR 07/20/19.  Continue statin and ASA daily for prevention.  Monitor closely.      Relevant Orders   Comprehensive metabolic panel   Lipid Panel w/o Chol/HDL Ratio   Hypertension associated with diabetes ([place])    Chronic, stable with BP at goal today in office and at goal on home readings when checks.  Recommend he monitor BP at home at least three mornings a week + focus on DASH diet.  Continue current medication regimen and adjust as needed.  Labs: CBC, CMP, TSH, urine ALB.  Return in 3 months.      Relevant Medications   Dulaglutide (TRULICITY) 1.91 YN/8.2NF SOPN   Other Relevant Orders   Bayer DCA Hb A1c Waived   Microalbumin, Urine Waived   Comprehensive metabolic panel   CBC with Differential/Platelet   TSH   Senile purpura (HCC)    Chronic, stable.  Recommend continue to perform gentle  skin care and monitor for skin breakdown, if present notify provider.      Relevant Orders   CBC with Differential/Platelet     Endocrine   DM due to underlying condition with diabetic nephropathy ([place]) - Primary    Chronic, ongoing in patient with history of renal cell CA.  A1C today 8.5%, trend up due to dietary indiscretions.  Continue current medication regimen and adjust as needed -- with exception of adding on Trulicity, we have discussed at past visit.  No family history of thyroid cancer and no personal history of pancreatitis.  Script sent to start at 0.75MG  weekly, his wife also takes and he is aware on how to inject.  Monitor BS daily in morning at home.  Due to his age and risk for hypoglycemia would avoid return to Glipizide. Could trial SGLT2, but with urinary symptoms may not tolerate.  Return to office in 3 months.      Relevant Medications   Dulaglutide (TRULICITY) 6.21 HY/8.6VH SOPN   Other Relevant Orders   Bayer DCA Hb A1c Waived   Microalbumin, Urine Waived   Hyperlipidemia associated with type 2 diabetes mellitus (HCC)    Chronic, ongoing.  Continue current medication regimen and adjust as needed.  Lipid panel today.      Relevant Medications   Dulaglutide (TRULICITY) 8.46 NG/2.9BM SOPN   Other Relevant Orders   Bayer DCA Hb A1c Waived   Comprehensive metabolic panel   Lipid Panel w/o Chol/HDL Ratio     Musculoskeletal and Integument   Rheumatoid arthritis of multiple sites with negative rheumatoid factor (HCC)    Chronic, ongoing.  Followed by rheumatology with much improved functional status since initially meeting a couple years ago.  Continue this collaboration and current regimen as prescribed by them.  Recent note reviewed.      Relevant Orders   Comprehensive metabolic panel   CBC with Differential/Platelet     Genitourinary   BPH with obstruction/lower urinary tract symptoms    Chronic, ongoing.  Continue current medication regimen and collaboration  with urology.          Other  B12 deficiency    Ongoing and improving with supplement with recent level at goal.  Recheck today and consider reducing or discontinuing supplement as needed.      Relevant Orders   Vitamin B12   Elevated PSA    Continue collaboration with urology.  Recent note reviewed.  PSA monitored by them.      Personal history of renal cancer    Followed by urology, continue this collaboration and review notes when present.       Vitamin D deficiency    Ongoing.  Continue daily supplement and recheck level today, recent was stable.      Relevant Orders   VITAMIN D 25 Hydroxy (Vit-D Deficiency, Fractures)   Other Visit Diagnoses     Encounter for annual physical exam       Annual physical today with labs and health maintenance reviewed -- tetanus vacine next visit per patient.        Discussed aspirin prophylaxis for myocardial infarction prevention and decision was  refuses  LABORATORY TESTING:  Health maintenance labs ordered today as discussed above.   The natural history of prostate cancer and ongoing controversy regarding screening and potential treatment outcomes of prostate cancer has been discussed with the patient. The meaning of a false positive PSA and a false negative PSA has been discussed. He indicates understanding of the limitations of this screening test and wishes  to proceed with screening PSA testing -- followed by urology.   IMMUNIZATIONS:   - Tdap: Tetanus vaccination status reviewed: needed, obtain next visit - Influenza: Up to date - Pneumovax: Up to date - Prevnar: Refused - Zostavax vaccine: will think about this and alert provider if requested  SCREENING: - Colonoscopy: Not applicable  Discussed with patient purpose of the colonoscopy is to detect colon cancer at curable precancerous or early stages   - AAA Screening: Not applicable  -Hearing Test: Not applicable  -Spirometry: Not applicable   PATIENT COUNSELING:     Sexuality: Discussed sexually transmitted diseases, partner selection, use of condoms, avoidance of unintended pregnancy  and contraceptive alternatives.   Advised to avoid cigarette smoking.  I discussed with the patient that most people either abstain from alcohol or drink within safe limits (<=14/week and <=4 drinks/occasion for males, <=7/weeks and <= 3 drinks/occasion for females) and that the risk for alcohol disorders and other health effects rises proportionally with the number of drinks per week and how often a drinker exceeds daily limits.  Discussed cessation/primary prevention of drug use and availability of treatment for abuse.   Diet: Encouraged to adjust caloric intake to maintain  or achieve ideal body weight, to reduce intake of dietary saturated fat and total fat, to limit sodium intake by avoiding high sodium foods and not adding table salt, and to maintain adequate dietary potassium and calcium preferably from fresh fruits, vegetables, and low-fat dairy products.    Stressed the importance of regular exercise  Injury prevention: Discussed safety belts, safety helmets, smoke detector, smoking near bedding or upholstery.   Dental health: Discussed importance of regular tooth brushing, flossing, and dental visits.   Follow up plan: NEXT PREVENTATIVE PHYSICAL DUE IN 1 YEAR. Return in about 4 weeks (around 07/02/2021) for T2DM.

## 2021-06-04 NOTE — Assessment & Plan Note (Signed)
Followed by urology, continue this collaboration and review notes when present.

## 2021-06-04 NOTE — Assessment & Plan Note (Signed)
Chronic, stable with BP at goal today in office and at goal on home readings when checks.  Recommend he monitor BP at home at least three mornings a week + focus on DASH diet.  Continue current medication regimen and adjust as needed.  Labs: CBC, CMP, TSH, urine ALB.  Return in 3 months.

## 2021-06-04 NOTE — Assessment & Plan Note (Signed)
Ongoing.  Continue daily supplement and recheck level today, recent was stable. 

## 2021-06-04 NOTE — Assessment & Plan Note (Signed)
Chronic, stable.  Recommend continue to perform gentle skin care and monitor for skin breakdown, if present notify provider.

## 2021-06-04 NOTE — Assessment & Plan Note (Signed)
Continue collaboration with urology.  Recent note reviewed.  PSA monitored by them.   

## 2021-06-04 NOTE — Assessment & Plan Note (Signed)
Noted on CXR 07/20/19.  Continue statin and ASA daily for prevention.  Monitor closely.

## 2021-06-04 NOTE — Assessment & Plan Note (Signed)
Chronic, ongoing in patient with history of renal cell CA.  A1C today 8.5%, trend up due to dietary indiscretions.  Continue current medication regimen and adjust as needed -- with exception of adding on Trulicity, we have discussed at past visit.  No family history of thyroid cancer and no personal history of pancreatitis.  Script sent to start at 0.75MG  weekly, his wife also takes and he is aware on how to inject.  Monitor BS daily in morning at home.  Due to his age and risk for hypoglycemia would avoid return to Glipizide. Could trial SGLT2, but with urinary symptoms may not tolerate.  Return to office in 3 months.

## 2021-06-04 NOTE — Assessment & Plan Note (Signed)
Chronic, ongoing.  Continue current medication regimen and collaboration with urology.

## 2021-06-05 ENCOUNTER — Ambulatory Visit (INDEPENDENT_AMBULATORY_CARE_PROVIDER_SITE_OTHER): Payer: Medicare Other

## 2021-06-05 ENCOUNTER — Telehealth: Payer: Medicare Other

## 2021-06-05 DIAGNOSIS — E1169 Type 2 diabetes mellitus with other specified complication: Secondary | ICD-10-CM | POA: Diagnosis not present

## 2021-06-05 DIAGNOSIS — I152 Hypertension secondary to endocrine disorders: Secondary | ICD-10-CM

## 2021-06-05 DIAGNOSIS — E785 Hyperlipidemia, unspecified: Secondary | ICD-10-CM | POA: Diagnosis not present

## 2021-06-05 DIAGNOSIS — M0609 Rheumatoid arthritis without rheumatoid factor, multiple sites: Secondary | ICD-10-CM | POA: Diagnosis not present

## 2021-06-05 DIAGNOSIS — E0821 Diabetes mellitus due to underlying condition with diabetic nephropathy: Secondary | ICD-10-CM

## 2021-06-05 DIAGNOSIS — E1159 Type 2 diabetes mellitus with other circulatory complications: Secondary | ICD-10-CM | POA: Diagnosis not present

## 2021-06-05 LAB — TSH: TSH: 4.2 u[IU]/mL (ref 0.450–4.500)

## 2021-06-05 LAB — CBC WITH DIFFERENTIAL/PLATELET
Basophils Absolute: 0 10*3/uL (ref 0.0–0.2)
Basos: 1 %
EOS (ABSOLUTE): 0.1 10*3/uL (ref 0.0–0.4)
Eos: 2 %
Hematocrit: 43.8 % (ref 37.5–51.0)
Hemoglobin: 14.6 g/dL (ref 13.0–17.7)
Immature Grans (Abs): 0 10*3/uL (ref 0.0–0.1)
Immature Granulocytes: 0 %
Lymphocytes Absolute: 1.5 10*3/uL (ref 0.7–3.1)
Lymphs: 26 %
MCH: 32.7 pg (ref 26.6–33.0)
MCHC: 33.3 g/dL (ref 31.5–35.7)
MCV: 98 fL — ABNORMAL HIGH (ref 79–97)
Monocytes Absolute: 0.4 10*3/uL (ref 0.1–0.9)
Monocytes: 6 %
Neutrophils Absolute: 3.8 10*3/uL (ref 1.4–7.0)
Neutrophils: 65 %
Platelets: 278 10*3/uL (ref 150–450)
RBC: 4.47 x10E6/uL (ref 4.14–5.80)
RDW: 13.1 % (ref 11.6–15.4)
WBC: 5.8 10*3/uL (ref 3.4–10.8)

## 2021-06-05 LAB — LIPID PANEL W/O CHOL/HDL RATIO
Cholesterol, Total: 128 mg/dL (ref 100–199)
HDL: 44 mg/dL (ref >39)
LDL Chol Calc (NIH): 60 mg/dL (ref 0–99)
Triglycerides: 140 mg/dL (ref 0–149)
VLDL Cholesterol Cal: 24 mg/dL (ref 5–40)

## 2021-06-05 LAB — COMPREHENSIVE METABOLIC PANEL
ALT: 13 IU/L (ref 0–44)
AST: 13 IU/L (ref 0–40)
Albumin/Globulin Ratio: 2 (ref 1.2–2.2)
Albumin: 4.3 g/dL (ref 3.7–4.7)
Alkaline Phosphatase: 87 IU/L (ref 44–121)
BUN/Creatinine Ratio: 18 (ref 10–24)
BUN: 15 mg/dL (ref 8–27)
Bilirubin Total: 0.7 mg/dL (ref 0.0–1.2)
CO2: 26 mmol/L (ref 20–29)
Calcium: 9.5 mg/dL (ref 8.6–10.2)
Chloride: 100 mmol/L (ref 96–106)
Creatinine, Ser: 0.85 mg/dL (ref 0.76–1.27)
Globulin, Total: 2.2 g/dL (ref 1.5–4.5)
Glucose: 202 mg/dL — ABNORMAL HIGH (ref 70–99)
Potassium: 4.8 mmol/L (ref 3.5–5.2)
Sodium: 138 mmol/L (ref 134–144)
Total Protein: 6.5 g/dL (ref 6.0–8.5)
eGFR: 89 mL/min/{1.73_m2} (ref >59)

## 2021-06-05 LAB — VITAMIN B12: Vitamin B-12: 830 pg/mL (ref 232–1245)

## 2021-06-05 LAB — VITAMIN D 25 HYDROXY (VIT D DEFICIENCY, FRACTURES): Vit D, 25-Hydroxy: 32.4 ng/mL (ref 30.0–100.0)

## 2021-06-05 NOTE — Chronic Care Management (AMB) (Signed)
Chronic Care Management   CCM RN Visit Note  06/05/2021 Name: Jason Contreras [name] DOB: Aug 12, 1940  Subjective: Jason Contreras is a 81 y.o. year old male who is a primary care patient of [name], [name], NP. The care management team was consulted for assistance with disease management and care coordination needs.    Engaged with patient by telephone for follow up visit in response to provider referral for case management and/or care coordination services.   Consent to Services:  The patient was given information about Chronic Care Management services, agreed to services, and gave verbal consent prior to initiation of services.  Please see initial visit note for detailed documentation.   Patient agreed to services and verbal consent obtained.   Assessment: Review of patient past medical history, allergies, medications, health status, including review of consultants reports, laboratory and other test data, was performed as part of comprehensive evaluation and provision of chronic care management services.   SDOH (Social Determinants of Health) assessments and interventions performed:    CCM Care Plan  Allergies  Allergen Reactions   Morphine Nausea And Vomiting   Morphine And Related Nausea And Vomiting    Outpatient Encounter Medications as of 06/05/2021  Medication Sig   acetaminophen (TYLENOL) 325 MG tablet Take 650 mg by mouth every 6 (six) hours as needed. Takes at bedtime and occasionally in am for back/hip pain   atorvastatin (LIPITOR) 20 MG tablet TAKE 1 TABLET BY MOUTH  DAILY AT 6 PM.   B Complex-C-Folic Acid (SUPER B COMPLEX/FA/VIT C PO) Take 1 tablet by mouth daily. Contains 675 mcg folic acid   carvedilol (COREG) 6.25 MG tablet TAKE 1 TABLET BY MOUTH  TWICE DAILY   cholecalciferol (VITAMIN D3) 25 MCG (1000 UNIT) tablet Take 2,000 Units by mouth daily.    Dulaglutide (TRULICITY) 9.16 BW/4.6KZ SOPN Inject 0.75 mg into the skin once a week.   DULoxetine (CYMBALTA)  60 MG capsule TAKE 1 CAPSULE BY MOUTH  DAILY   finasteride (PROSCAR) 5 MG tablet Take 1 tablet (5 mg total) by mouth daily.   folic acid (FOLVITE) 1 MG tablet Take 1 tablet (1 mg total) by mouth daily.   gabapentin (NEURONTIN) 600 MG tablet TAKE 1 TABLET BY MOUTH 3  TIMES DAILY   lidocaine (LMX) 4 % cream Apply 1 application topically as needed.   metFORMIN (GLUCOPHAGE) 500 MG tablet TAKE 2 TABLETS BY MOUTH  TWICE DAILY   methotrexate (RHEUMATREX) 2.5 MG tablet Take 2.5 mg by mouth once a week. 6 tabs weekly   mometasone (ELOCON) 0.1 % cream APPLY  CREAM TOPICALLY ONCE DAILY   tamsulosin (FLOMAX) 0.4 MG CAPS capsule TAKE 1 CAPSULE BY MOUTH  DAILY   vitamin C (ASCORBIC ACID) 500 MG tablet Take 500 mg by mouth daily.   No facility-administered encounter medications on file as of 06/05/2021.    Patient Active Problem List   Diagnosis Date Noted   Aortic atherosclerosis ([place]) 05/26/2020   Rheumatoid arthritis of multiple sites with negative rheumatoid factor ([place]) 12/08/2019   B12 deficiency 07/12/2019   Vitamin D deficiency 06/30/2019   Personal history of renal cancer 10/17/2017   Advanced care planning/counseling discussion 01/13/2017   Senile purpura ([place]) 09/26/2016   Elevated PSA 01/07/2015   BPH with obstruction/lower urinary tract symptoms 01/07/2015   DM due to underlying condition with diabetic nephropathy ([place])    Hypertension associated with diabetes ([place])    Hyperlipidemia associated with type 2 diabetes mellitus ([place])  Conditions to be addressed/monitored:HTN, HLD, DMII, and RA  Care Plan : RNCM; General Plan of Care (Adult) for Chronic Disease Management and Care Coordination Needs  Updates made by Vanita Ingles, RN since 06/05/2021 12:00 AM     Problem: RNCM: Development of Plan of Care for Chronic Disease Management (RA, HTN, HLD, DM)   Priority: High     Long-Range Goal: RNCM: Effective Management  of Plan of Care for Chronic Disease Management (RA, HTN, HLD,  DM)   Start Date: 03/21/2021  Expected End Date: 03/21/2022  Priority: High  Note:   Current Barriers:  Knowledge Deficits related to plan of care for management of HTN, HLD, DMII, and RA  Chronic Disease Management support and education needs related to HTN, HLD, DMII, and RA  RNCM Clinical Goal(s):  Patient will verbalize understanding of plan for management of HTN, HLD, DMII, and RA as evidenced by compliance with the plan of care, compliance with dietary restrictions, and working with the pcp and CCM team to optimize health and well being  demonstrate understanding of rationale for each prescribed medication as evidenced by compliance with medications and calling for refills before funning out of medications     attend all scheduled medical appointments: 07-02-2020 at 140 pm as evidenced by keeping appointments and calling for needed schedule changes         demonstrate improved and ongoing adherence to prescribed treatment plan for HTN, HLD, DMII, and RA as evidenced by taking blood sugars as directed, compliance with medications and dietary restrictions, and working with the CCM team to effectively manage health and well being  demonstrate a decrease in HTN, HLD, DMII, and RA exacerbations  as evidenced by calling for changes in condition and working with the CCM team and pcp to effectively manage chronic conditions  demonstrate ongoing self health care management ability for effective management of chronic conditions  as evidenced by working with the CCM team  through collaboration with Consulting civil engineer, provider, and care team.   Interventions: 1:1 collaboration with primary care provider regarding development and update of comprehensive plan of care as evidenced by provider attestation and co-signature Inter-disciplinary care team collaboration (see longitudinal plan of care) Evaluation of current treatment plan related to  self management and patient's adherence to plan as established by  provider   Diabetes:  (Status: Goal on track: NO.) Long Term Goal   Lab Results  Component Value Date   HGBA1C 8.5 (H) 06/04/2021  02-27-2021 A1C 7.5 Assessed patient's understanding of A1c goal: <7% Provided education to patient about basic DM disease process; Reviewed medications with patient and discussed importance of medication adherence. 06-05-2021: The patient is compliant with medications. Saw pcp yesterday and will start new medications. Will return next month for evaluation of A1C and medication changes.         Reviewed prescribed diet with patient heart healthy/ADA diet. 06-05-2021: Reviewed to stay away from starchy foods- potatoes, breads, pastas.  The patient does not like sweets but does like breads. Education on monitoring dietary restrictions and eating foods like bread in moderation. The patient states he liked a lot of different foods at Christmas and knows he needs to watch his dietary intake better. Encouraged patient to watch out for foods high in carbs and sugars; Counseled on importance of regular laboratory monitoring as prescribed. 06-05-2021: Has regular testing. ;        Discussed plans with patient for ongoing care management follow up and provided patient with  direct contact information for care management team;      Provided patient with written educational materials related to hypo and hyperglycemia and importance of correct treatment. 06-05-2021: The patient denies any lows , is having some elevations in readings. This am was 249.      Reviewed scheduled/upcoming provider appointments including: 07-02-2021 at 140 pm;         Advised patient, providing education and rationale, to check cbg as directed and record. 03-21-2021: The patient has not been consistently checking blood sugars. Education on the benefits of checking blood sugars on a consistent basis. The patient states that he doesn't really know why he does not check. Review of goal of fasting blood sugars <130  and post prandial of <180. The patients last A1C trending down. The patient states he may have to add another medication to his regimen and will talk to the pcp about this in January.  06-05-2021: The patient usually checks blood sugars well after pcp visits and then stops. Education on the benefits of checking blood sugars more frequently. The patient states that if he took blood sugars 6 times a day he could not afford the strips for him and his wife. Education on checking per recommendations of pcp, and if he is feeling different. Review of goals of blood sugars: Fasting <130 and post prandial of <180. The patient states his blood sugars this am was 249. The patient states he got his machine from [facility] and buys strips there. Will collaborate with the pharm D for support in contacting the patient and see what options he has for a meter through his insurance that would provide him with strips also at no or reduced cost. Education provided to the patient.       call provider for findings outside established parameters;       Review of patient status, including review of consultants reports, relevant laboratory and other test results, and medications completed;       Advised patient to discuss medication options  with provider;      Screening for signs and symptoms of depression related to chronic disease state;        Assessed social determinant of health barriers;         Hyperlipidemia:  (Status: Goal on Track (progressing): YES.) Long Term Goal  Lab Results  Component Value Date   CHOL 128 06/04/2021   HDL 44 06/04/2021   LDLCALC 60 06/04/2021   TRIG 140 06/04/2021   CHOLHDL 2.5 03/12/2018     Medication review performed; medication list updated in electronic medical record. 06-05-2021: The patient is compliant with medications Provider established cholesterol goals reviewed. 06-05-2021: The patient is at goal. Praised for having levels at goal; Counseled on importance of regular laboratory  monitoring as prescribed. 06-05-2021: The patient has regular lab work done Provided HLD Scientist, clinical (histocompatibility and immunogenetics); Reviewed role and benefits of statin for ASCVD risk reduction; Discussed strategies to manage statin-induced myalgias; Reviewed importance of limiting foods high in cholesterol;  Hypertension: (Status: Goal on Track (progressing): YES.) Last practice recorded BP readings:  BP Readings from Last 3 Encounters:  06/04/21 106/72  02/27/21 128/86  11/27/20 132/82  Most recent eGFR/CrCl:  Lab Results  Component Value Date   EGFR 89 06/04/2021    No components found for: CRCL  Evaluation of current treatment plan related to hypertension self management and patient's adherence to plan as established by provider. 06-05-2021: The patient states that he has not new concerns with HTN or  heart health. Will continue to monitor.    Provided education to patient re: stroke prevention, s/s of heart attack and stroke; Reviewed prescribed diet heart healthy/ADA diet . 06-05-2021: Reviewed with the patient.  Reviewed medications with patient and discussed importance of compliance. 06-05-2021: Reviewed with the patient  Discussed plans with patient for ongoing care management follow up and provided patient with direct contact information for care management team; Advised patient, providing education and rationale, to monitor blood pressure daily and record, calling PCP for findings outside established parameters;  Provided education on prescribed diet heart healthy/ADA;  Discussed complications of poorly controlled blood pressure such as heart disease, stroke, circulatory complications, vision complications, kidney impairment, sexual dysfunction;   Pain:  (Status: Goal on Track (progressing): YES.) Long Term Goal  Pain assessment performed. 06-05-2021: The patient rates his RA pain at 0 today. Is thankful that the regimen he is on is working well. He is in his shop working and happy.  Medications  reviewed. 06-05-2021: Is compliant with medications. Denies any concerns with medications Reviewed provider established plan for pain management. 06-05-2021: Follows rheumatologist. Current plan of care is effective Discussed importance of adherence to all scheduled medical appointments; Counseled on the importance of reporting any/all new or changed pain symptoms or management strategies to pain management provider; Advised patient to report to care team affect of pain on daily activities; Discussed use of relaxation techniques and/or diversional activities to assist with pain reduction (distraction, imagery, relaxation, massage, acupressure, TENS, heat, and cold application; Reviewed with patient prescribed pharmacological and nonpharmacological pain relief strategies; Advised patient to discuss uncontrolled pain, changes in level or intensity of pain  with provider;  Patient Goals/Self-Care Activities: Patient will self administer medications as prescribed as evidenced by self report/primary caregiver report  Patient will attend all scheduled provider appointments as evidenced by clinician review of documented attendance to scheduled appointments and patient/caregiver report Patient will call pharmacy for medication refills as evidenced by patient report and review of pharmacy fill history as appropriate Patient will attend church or other social activities as evidenced by patient report Patient will continue to perform ADL's independently as evidenced by patient/caregiver report Patient will continue to perform IADL's independently as evidenced by patient/caregiver report Patient will call provider office for new concerns or questions as evidenced by review of documented incoming telephone call notes and patient report Patient will work with BSW to address care coordination needs and will continue to work with the clinical team to address health care and disease management related needs as  evidenced by documented adherence to scheduled care management/care coordination appointments schedule appointment with eye doctor. 06-05-2021: The patient saw the eye doctor 3 weeks ago. No issues with diabetic neuropathy- got new glasses check blood sugar at prescribed times: twice daily, when you have symptoms of low or high blood sugar, before and after exercise, and as directed by the provider. The patient has not been consistently taking blood sugars, reviewed the benefits of taking blood sugars on a regular basis  check feet daily for cuts, sores or redness enter blood sugar readings and medication or insulin into daily log take the blood sugar log to all doctor visits trim toenails straight across drink 6 to 8 glasses of water each day eat fish at least once per week fill half of plate with vegetables limit fast food meals to no more than 1 per week manage portion size prepare main meal at home 3 to 5 days each week read food labels  for fat, fiber, carbohydrates and portion size reduce red meat to 2 to 3 times a week wash and dry feet carefully every day wear comfortable, cotton socks wear comfortable, well-fitting shoes - check blood pressure weekly - choose a place to take my blood pressure (home, clinic or office, retail store) - write blood pressure results in a log or diary - learn about high blood pressure - keep a blood pressure log - take blood pressure log to all doctor appointments - call doctor for signs and symptoms of high blood pressure - develop an action plan for high blood pressure - keep all doctor appointments - take medications for blood pressure exactly as prescribed - report new symptoms to your doctor - eat more whole grains, fruits and vegetables, lean meats and healthy fats - call for medicine refill 2 or 3 days before it runs out - take all medications exactly as prescribed - call doctor with any symptoms you believe are related to your medicine -  call doctor when you experience any new symptoms - go to all doctor appointments as scheduled - adhere to prescribed diet: heart healthy/ADA       Plan:Telephone follow up appointment with care management team member scheduled for:  08-07-2021 at [place] am Noreene Larsson RN, MSN, [facility] Mobile: [phone]

## 2021-06-05 NOTE — Progress Notes (Signed)
Contacted via MyChart   Good morning Jason Contreras, your labs have returned and overall they remain nice and stable.  Kidney function, creatinine and eGFR, is normal, as is liver function, AST and ALT.  Overall no changes needed, other then what we discussed yesterday.  Any questions? Keep being amazing!!  Thank you for allowing me to participate in your care.  I appreciate you. Kindest regards, Izaah Westman

## 2021-06-05 NOTE — Patient Instructions (Signed)
Visit Information  Thank you for taking time to visit with me today. Please [place]'t hesitate to contact me if I can be of assistance to you before our next scheduled telephone appointment.  Following are the goals we discussed today:  RNCM Clinical Goal(s):  Patient will verbalize understanding of plan for management of HTN, HLD, DMII, and RA as evidenced by compliance with the plan of care, compliance with dietary restrictions, and working with the pcp and CCM team to optimize health and well being  demonstrate understanding of rationale for each prescribed medication as evidenced by compliance with medications and calling for refills before funning out of medications     attend all scheduled medical appointments: 07-02-2020 at 140 pm as evidenced by keeping appointments and calling for needed schedule changes         demonstrate improved and ongoing adherence to prescribed treatment plan for HTN, HLD, DMII, and RA as evidenced by taking blood sugars as directed, compliance with medications and dietary restrictions, and working with the CCM team to effectively manage health and well being  demonstrate a decrease in HTN, HLD, DMII, and RA exacerbations  as evidenced by calling for changes in condition and working with the CCM team and pcp to effectively manage chronic conditions  demonstrate ongoing self health care management ability for effective management of chronic conditions  as evidenced by working with the CCM team  through collaboration with Consulting civil engineer, provider, and care team.    Interventions: 1:1 collaboration with primary care provider regarding development and update of comprehensive plan of care as evidenced by provider attestation and co-signature Inter-disciplinary care team collaboration (see longitudinal plan of care) Evaluation of current treatment plan related to  self management and patient's adherence to plan as established by provider     Diabetes:  (Status: Goal on  track: NO.) Long Term Goal         Lab Results  Component Value Date    HGBA1C 8.5 (H) 06/04/2021  02-27-2021 A1C 7.5 Assessed patient's understanding of A1c goal: <7% Provided education to patient about basic DM disease process; Reviewed medications with patient and discussed importance of medication adherence. 06-05-2021: The patient is compliant with medications. Saw pcp yesterday and will start new medications. Will return next month for evaluation of A1C and medication changes.         Reviewed prescribed diet with patient heart healthy/ADA diet. 06-05-2021: Reviewed to stay away from starchy foods- potatoes, breads, pastas.  The patient does not like sweets but does like breads. Education on monitoring dietary restrictions and eating foods like bread in moderation. The patient states he liked a lot of different foods at Christmas and knows he needs to watch his dietary intake better. Encouraged patient to watch out for foods high in carbs and sugars; Counseled on importance of regular laboratory monitoring as prescribed. 06-05-2021: Has regular testing. ;        Discussed plans with patient for ongoing care management follow up and provided patient with direct contact information for care management team;      Provided patient with written educational materials related to hypo and hyperglycemia and importance of correct treatment. 06-05-2021: The patient denies any lows , is having some elevations in readings. This am was 249.      Reviewed scheduled/upcoming provider appointments including: 07-02-2021 at 140 pm;         Advised patient, providing education and rationale, to check cbg as directed and record. 03-21-2021: The patient  has not been consistently checking blood sugars. Education on the benefits of checking blood sugars on a consistent basis. The patient states that he doesn't really know why he does not check. Review of goal of fasting blood sugars <130 and post prandial of <180. The  patients last A1C trending down. The patient states he may have to add another medication to his regimen and will talk to the pcp about this in January.  06-05-2021: The patient usually checks blood sugars well after pcp visits and then stops. Education on the benefits of checking blood sugars more frequently. The patient states that if he took blood sugars 6 times a day he could not afford the strips for him and his wife. Education on checking per recommendations of pcp, and if he is feeling different. Review of goals of blood sugars: Fasting <130 and post prandial of <180. The patient states his blood sugars this am was 249. The patient states he got his machine from [facility] and buys strips there. Will collaborate with the pharm D for support in contacting the patient and see what options he has for a meter through his insurance that would provide him with strips also at no or reduced cost. Education provided to the patient.       call provider for findings outside established parameters;       Review of patient status, including review of consultants reports, relevant laboratory and other test results, and medications completed;       Advised patient to discuss medication options  with provider;      Screening for signs and symptoms of depression related to chronic disease state;        Assessed social determinant of health barriers;          Hyperlipidemia:  (Status: Goal on Track (progressing): YES.) Long Term Goal       Lab Results  Component Value Date    CHOL 128 06/04/2021    HDL 44 06/04/2021    LDLCALC 60 06/04/2021    TRIG 140 06/04/2021    CHOLHDL 2.5 03/12/2018      Medication review performed; medication list updated in electronic medical record. 06-05-2021: The patient is compliant with medications Provider established cholesterol goals reviewed. 06-05-2021: The patient is at goal. Praised for having levels at goal; Counseled on importance of regular laboratory monitoring as prescribed.  06-05-2021: The patient has regular lab work done Provided HLD Scientist, clinical (histocompatibility and immunogenetics); Reviewed role and benefits of statin for ASCVD risk reduction; Discussed strategies to manage statin-induced myalgias; Reviewed importance of limiting foods high in cholesterol;   Hypertension: (Status: Goal on Track (progressing): YES.) Last practice recorded BP readings:     BP Readings from Last 3 Encounters:  06/04/21 106/72  02/27/21 128/86  11/27/20 132/82  Most recent eGFR/CrCl:       Lab Results  Component Value Date    EGFR 89 06/04/2021    No components found for: CRCL   Evaluation of current treatment plan related to hypertension self management and patient's adherence to plan as established by provider. 06-05-2021: The patient states that he has not new concerns with HTN or heart health. Will continue to monitor.    Provided education to patient re: stroke prevention, s/s of heart attack and stroke; Reviewed prescribed diet heart healthy/ADA diet . 06-05-2021: Reviewed with the patient.  Reviewed medications with patient and discussed importance of compliance. 06-05-2021: Reviewed with the patient  Discussed plans with patient for ongoing care management follow up  and provided patient with direct contact information for care management team; Advised patient, providing education and rationale, to monitor blood pressure daily and record, calling PCP for findings outside established parameters;  Provided education on prescribed diet heart healthy/ADA;  Discussed complications of poorly controlled blood pressure such as heart disease, stroke, circulatory complications, vision complications, kidney impairment, sexual dysfunction;    Pain:  (Status: Goal on Track (progressing): YES.) Long Term Goal  Pain assessment performed. 06-05-2021: The patient rates his RA pain at 0 today. Is thankful that the regimen he is on is working well. He is in his shop working and happy.  Medications reviewed.  06-05-2021: Is compliant with medications. Denies any concerns with medications Reviewed provider established plan for pain management. 06-05-2021: Follows rheumatologist. Current plan of care is effective Discussed importance of adherence to all scheduled medical appointments; Counseled on the importance of reporting any/all new or changed pain symptoms or management strategies to pain management provider; Advised patient to report to care team affect of pain on daily activities; Discussed use of relaxation techniques and/or diversional activities to assist with pain reduction (distraction, imagery, relaxation, massage, acupressure, TENS, heat, and cold application; Reviewed with patient prescribed pharmacological and nonpharmacological pain relief strategies; Advised patient to discuss uncontrolled pain, changes in level or intensity of pain  with provider;   Patient Goals/Self-Care Activities: Patient will self administer medications as prescribed as evidenced by self report/primary caregiver report  Patient will attend all scheduled provider appointments as evidenced by clinician review of documented attendance to scheduled appointments and patient/caregiver report Patient will call pharmacy for medication refills as evidenced by patient report and review of pharmacy fill history as appropriate Patient will attend church or other social activities as evidenced by patient report Patient will continue to perform ADL's independently as evidenced by patient/caregiver report Patient will continue to perform IADL's independently as evidenced by patient/caregiver report Patient will call provider office for new concerns or questions as evidenced by review of documented incoming telephone call notes and patient report Patient will work with BSW to address care coordination needs and will continue to work with the clinical team to address health care and disease management related needs as evidenced by  documented adherence to scheduled care management/care coordination appointments schedule appointment with eye doctor. 06-05-2021: The patient saw the eye doctor 3 weeks ago. No issues with diabetic neuropathy- got new glasses check blood sugar at prescribed times: twice daily, when you have symptoms of low or high blood sugar, before and after exercise, and as directed by the provider. The patient has not been consistently taking blood sugars, reviewed the benefits of taking blood sugars on a regular basis  check feet daily for cuts, sores or redness enter blood sugar readings and medication or insulin into daily log take the blood sugar log to all doctor visits trim toenails straight across drink 6 to 8 glasses of water each day eat fish at least once per week fill half of plate with vegetables limit fast food meals to no more than 1 per week manage portion size prepare main meal at home 3 to 5 days each week read food labels for fat, fiber, carbohydrates and portion size reduce red meat to 2 to 3 times a week wash and dry feet carefully every day wear comfortable, cotton socks wear comfortable, well-fitting shoes - check blood pressure weekly - choose a place to take my blood pressure (home, clinic or office, retail store) - write blood pressure results in  a log or diary - learn about high blood pressure - keep a blood pressure log - take blood pressure log to all doctor appointments - call doctor for signs and symptoms of high blood pressure - develop an action plan for high blood pressure - keep all doctor appointments - take medications for blood pressure exactly as prescribed - report new symptoms to your doctor - eat more whole grains, fruits and vegetables, lean meats and healthy fats - call for medicine refill 2 or 3 days before it runs out - take all medications exactly as prescribed - call doctor with any symptoms you believe are related to your medicine - call doctor when  you experience any new symptoms - go to all doctor appointments as scheduled - adhere to prescribed diet: heart healthy/ADA      Our next appointment is by telephone on 08-07-2021 at 0945 am  Please call the care guide team at [phone] if you need to cancel or reschedule your appointment.   If you are experiencing a Mental Health or [place] or need someone to talk to, please call the Suicide and Crisis Lifeline: 988 call the [place] National Suicide Prevention Lifeline: (415) 763-8814 or TTY: 2528380752 TTY 602 074 8180) to talk to a trained counselor call 1-800-273-TALK (toll free, 24 hour hotline)   Patient verbalizes understanding of instructions and care plan provided today and agrees to view in [place]. Active MyChart status confirmed with patient.    Noreene Larsson RN, MSN, [facility] Mobile: [phone]   Hemoglobin A1C Test Why am I having this test? You may have the hemoglobin A1C test (A1C test) done to: Evaluate your risk for developing diabetes (diabetes mellitus). Diagnose diabetes. Monitor long-term control of blood sugar (glucose) in people who have diabetes and help make treatment decisions. This test may be done with other blood glucose tests, such as fasting blood glucose and oral glucose tolerance tests. What is being tested? Hemoglobin is a type of protein in the blood that carries oxygen. Glucose attaches to hemoglobin to form glycated hemoglobin. This test checks the amount of glycated hemoglobin in your blood, which is a good indicator of the average amount of glucose in your blood during the past 2-3 months. What kind of sample is taken? A blood sample is required for this test. It is usually collected by inserting a needle into a blood vessel. Tell a health care provider about: All medicines you are taking, including vitamins, herbs, eye drops, creams, and  over-the-counter medicines. Any blood disorders you have. Any surgeries you have had. Any medical conditions you have. Whether you are pregnant or may be pregnant. How are the results reported? Your results will be reported as a percentage that indicates how much of your hemoglobin has glucose attached to it (is glycated). Your health care provider will compare your results to normal ranges that were established after testing a large group of people (reference ranges). Reference ranges may vary among labs and hospitals. For this test, common reference ranges are: Adult or child without diabetes: 4-5.6%. Adult or child with diabetes and good blood glucose control: less than 7%. What do the results mean? If you have diabetes: A result of less than 7% is considered normal, meaning that your blood glucose is well controlled. A result higher than 7% means that your blood glucose is not well controlled, and your treatment plan may need to be adjusted. If you do  not have diabetes: A result within the reference range is considered normal, meaning that you are not at high risk for diabetes. A result of 5.7-6.4% means that you have a high risk of developing diabetes, and you have prediabetes. Prediabetes is the condition of having a blood glucose level that is higher than it should be but not high enough for you to be diagnosed with diabetes. Having prediabetes puts you at risk for developing type 2 diabetes. You may have more tests, including a repeat A1C test. Results of 6.5% or higher on two separate A1C tests mean that you have diabetes. You may have more tests to confirm the diagnosis. Abnormally low A1C values may be caused by: Pregnancy. Severe blood loss. Receiving donated blood (transfusions). Low red blood cell count (anemia). Long-term kidney failure. Some unusual forms (variants) of hemoglobin. Talk with your health care provider about what your results mean. Questions to ask your health  care provider Ask your health care provider, or the department that is doing the test: When will my results be ready? How will I get my results? What are my treatment options? What other tests do I need? What are my next steps? Summary The A1C test may be done to evaluate your risk for developing diabetes, to diagnose diabetes, and to monitor long-term control of blood sugar (glucose) in people who have diabetes and help make treatment decisions. Hemoglobin is a type of protein in the blood that carries oxygen. Glucose attaches to hemoglobin to form glycated hemoglobin. This test checks the amount of glycated hemoglobin in your blood, which is a good indicator of the average amount of glucose in your blood during the past 2-3 months. Talk with your health care provider about what your results mean. This information is not intended to replace advice given to you by your health care provider. Make sure you discuss any questions you have with your health care provider. Document Revised: 01/19/2020 Document Reviewed: 01/19/2020 Elsevier Patient Education  2022 [place]. Diabetes Mellitus and Nutrition, Adult When you have diabetes, or diabetes mellitus, it is very important to have healthy eating habits because your blood sugar (glucose) levels are greatly affected by what you eat and drink. Eating healthy foods in the right amounts, at about the same times every day, can help you: Manage your blood glucose. Lower your risk of heart disease. Improve your blood pressure. Reach or maintain a healthy weight. What can affect my meal plan? Every person with diabetes is different, and each person has different needs for a meal plan. Your health care provider may recommend that you work with a dietitian to make a meal plan that is best for you. Your meal plan may vary depending on factors such as: The calories you need. The medicines you take. Your weight. Your blood glucose, blood pressure, and  cholesterol levels. Your activity level. Other health conditions you have, such as heart or kidney disease. How do carbohydrates affect me? Carbohydrates, also called carbs, affect your blood glucose level more than any other type of food. Eating carbs raises the amount of glucose in your blood. It is important to know how many carbs you can safely have in each meal. This is different for every person. Your dietitian can help you calculate how many carbs you should have at each meal and for each snack. How does alcohol affect me? Alcohol can cause a decrease in blood glucose (hypoglycemia), especially if you use insulin or take certain diabetes medicines by mouth.  Hypoglycemia can be a life-threatening condition. Symptoms of hypoglycemia, such as sleepiness, dizziness, and confusion, are similar to symptoms of having too much alcohol. Do not drink alcohol if: Your health care provider tells you not to drink. You are pregnant, may be pregnant, or are planning to become pregnant. If you drink alcohol: Limit how much you have to: 0-1 drink a day for women. 0-2 drinks a day for men. Know how much alcohol is in your drink. In the [place] one drink equals one 12 oz bottle of beer (355 mL), one 5 oz glass of wine (148 mL), or one 1 oz glass of hard liquor (44 mL). Keep yourself hydrated with water, diet soda, or unsweetened iced tea. Keep in mind that regular soda, juice, and other mixers may contain a lot of sugar and must be counted as carbs. What are tips for following this plan? Reading food labels Start by checking the serving size on the Nutrition Facts label of packaged foods and drinks. The number of calories and the amount of carbs, fats, and other nutrients listed on the label are based on one serving of the item. Many items contain more than one serving per package. Check the total grams (g) of carbs in one serving. Check the number of grams of saturated fats and trans fats in one serving.  Choose foods that have a low amount or none of these fats. Check the number of milligrams (mg) of salt (sodium) in one serving. Most people should limit total sodium intake to less than 2,300 mg per day. Always check the nutrition information of foods labeled as "low-fat" or "nonfat." These foods may be higher in added sugar or refined carbs and should be avoided. Talk to your dietitian to identify your daily goals for nutrients listed on the label. Shopping Avoid buying canned, pre-made, or processed foods. These foods tend to be high in fat, sodium, and added sugar. Shop around the outside edge of the grocery store. This is where you will most often find fresh fruits and vegetables, bulk grains, fresh meats, and fresh dairy products. Cooking Use low-heat cooking methods, such as baking, instead of high-heat cooking methods, such as deep frying. Cook using healthy oils, such as olive, canola, or sunflower oil. Avoid cooking with butter, cream, or high-fat meats. Meal planning Eat meals and snacks regularly, preferably at the same times every day. Avoid going long periods of time without eating. Eat foods that are high in fiber, such as fresh fruits, vegetables, beans, and whole grains. Eat 4-6 oz (112-168 g) of lean protein each day, such as lean meat, chicken, fish, eggs, or tofu. One ounce (oz) (28 g) of lean protein is equal to: 1 oz (28 g) of meat, chicken, or fish. 1 egg.  cup (62 g) of tofu. Eat some foods each day that contain healthy fats, such as avocado, nuts, seeds, and fish. What foods should I eat? Fruits Berries. Apples. Oranges. Peaches. Apricots. Plums. Grapes. Mangoes. Papayas. Pomegranates. Kiwi. Cherries. Vegetables Leafy greens, including lettuce, spinach, kale, chard, collard greens, mustard greens, and cabbage. Beets. Cauliflower. Broccoli. Carrots. Green beans. Tomatoes. Peppers. Onions. Cucumbers. Brussels sprouts. Grains Whole grains, such as whole-wheat or  whole-grain bread, crackers, tortillas, cereal, and pasta. Unsweetened oatmeal. Quinoa. Brown or wild rice. Meats and other proteins Seafood. Poultry without skin. Lean cuts of poultry and beef. Tofu. Nuts. Seeds. Dairy Low-fat or fat-free dairy products such as milk, yogurt, and cheese. The items listed above may not be a complete  list of foods and beverages you can eat and drink. Contact a dietitian for more information. What foods should I avoid? Fruits Fruits canned with syrup. Vegetables Canned vegetables. Frozen vegetables with butter or cream sauce. Grains Refined white flour and flour products such as bread, pasta, snack foods, and cereals. Avoid all processed foods. Meats and other proteins Fatty cuts of meat. Poultry with skin. Breaded or fried meats. Processed meat. Avoid saturated fats. Dairy Full-fat yogurt, cheese, or milk. Beverages Sweetened drinks, such as soda or iced tea. The items listed above may not be a complete list of foods and beverages you should avoid. Contact a dietitian for more information. Questions to ask a health care provider Do I need to meet with a certified diabetes care and education specialist? Do I need to meet with a dietitian? What number can I call if I have questions? When are the best times to check my blood glucose? Where to find more information: American Diabetes Association: diabetes.org Academy of Nutrition and Dietetics: eatright.[employer] of Diabetes and Digestive and Kidney Diseases: [employer] Association of Diabetes Care & Education Specialists: diabeteseducator.org Summary It is important to have healthy eating habits because your blood sugar (glucose) levels are greatly affected by what you eat and drink. It is important to use alcohol carefully. A healthy meal plan will help you manage your blood glucose and lower your risk of heart disease. Your health care provider may recommend that you work with a  dietitian to make a meal plan that is best for you. This information is not intended to replace advice given to you by your health care provider. Make sure you discuss any questions you have with your health care provider. Document Revised: 11/24/2019 Document Reviewed: 11/24/2019 Elsevier Patient Education  [place].

## 2021-07-01 NOTE — Patient Instructions (Signed)

## 2021-07-02 ENCOUNTER — Encounter: Payer: Self-pay | Admitting: Nurse Practitioner

## 2021-07-02 ENCOUNTER — Other Ambulatory Visit: Payer: Self-pay

## 2021-07-02 ENCOUNTER — Ambulatory Visit (INDEPENDENT_AMBULATORY_CARE_PROVIDER_SITE_OTHER): Payer: Medicare Other | Admitting: Nurse Practitioner

## 2021-07-02 VITALS — BP 122/72 | HR 76 | Temp 98.6°F | Ht 68.25 in | Wt 199.4 lb

## 2021-07-02 DIAGNOSIS — E0821 Diabetes mellitus due to underlying condition with diabetic nephropathy: Secondary | ICD-10-CM

## 2021-07-02 DIAGNOSIS — E1159 Type 2 diabetes mellitus with other circulatory complications: Secondary | ICD-10-CM

## 2021-07-02 DIAGNOSIS — I152 Hypertension secondary to endocrine disorders: Secondary | ICD-10-CM

## 2021-07-02 MED ORDER — SITAGLIPTIN PHOSPHATE 100 MG PO TABS: 100.0000 mg | ORAL_TABLET | Freq: Every day | ORAL | 1 refills | Status: DC

## 2021-07-02 NOTE — Progress Notes (Addendum)
BP 122/72    Pulse 76    Temp 98.6 F (37 C) (Oral)    Ht 5' 8.25" (1.734 m)    Wt 199 lb 6.4 oz (90.4 kg)    SpO2 99%    BMI 30.10 kg/m    Subjective:    Patient ID: Jason Contreras, male    DOB: 1940/05/11, 81 y.o.   MRN: [id number]  HPI: Jason Contreras is a 81 y.o. male  Chief Complaint  Patient presents with   Diabetes    Patient states he is doing well. Patient states he was started Trulicity and says it is killing him. Patient states every time he takes the medication. He has diarrhea and indigestion. Patient states he is wondering if that is something that is temporary or something to expect when first started the medication. Patient states he has taken 4 doses.    DIABETES Follow-up for diabetes, started on Trulicity at visit 5/44/92 due to A1c 8.5% -- takes Friday at lunch but then 2-3 days will have GI upset with this = diarrhea and gas.  Continues on Metformin 1000 MG BID. Continues on Gabapentin TID and B12 for neuropathy + Vitamin D for history of low levels. Hypoglycemic episodes:no Polydipsia/polyuria: no Visual disturbance: no Chest pain: no Paresthesias: no Glucose Monitoring: no             Accucheck frequency: daily             Fasting glucose: 137 to 249             Post prandial:             Evening:             Before meals: Taking Insulin?: no             Long acting insulin:             Short acting insulin: Blood Pressure Monitoring: a few times a week Retinal Examination: Not up to Date Foot Exam: Up to Date Pneumovax: Not up to Date Influenza: Up to Date Aspirin: no   Relevant past medical, surgical, family and social history reviewed and updated as indicated. Interim medical history since our last visit reviewed. Allergies and medications reviewed and updated.  Review of Systems  Constitutional:  Negative for activity change, diaphoresis, fatigue and fever.  Respiratory:  Negative for cough, chest tightness, shortness of breath and wheezing.    Cardiovascular:  Negative for chest pain, palpitations and leg swelling.  Gastrointestinal: Negative.   Endocrine: Negative for polydipsia, polyphagia and polyuria.  Neurological:  Negative for dizziness, syncope, weakness and numbness.  Psychiatric/Behavioral: Negative.     Per HPI unless specifically indicated above     Objective:    BP 122/72    Pulse 76    Temp 98.6 F (37 C) (Oral)    Ht 5' 8.25" (1.734 m)    Wt 199 lb 6.4 oz (90.4 kg)    SpO2 99%    BMI 30.10 kg/m   Wt Readings from Last 3 Encounters:  07/02/21 199 lb 6.4 oz (90.4 kg)  06/04/21 200 lb 3.2 oz (90.8 kg)  02/27/21 203 lb 3.2 oz (92.2 kg)    Physical Exam Vitals and nursing note reviewed.  Constitutional:      General: He is awake.     Appearance: He is well-developed and overweight.  HENT:     Head: Normocephalic and atraumatic.     Right Ear: Hearing  normal. No drainage.     Left Ear: Hearing normal. No drainage.  Eyes:     General: Lids are normal.        Right eye: No discharge.        Left eye: No discharge.     Conjunctiva/sclera: Conjunctivae normal.     Pupils: Pupils are equal, round, and reactive to light.  Neck:     Thyroid: No thyromegaly.     Vascular: No carotid bruit or JVD.     Trachea: Trachea normal.  Cardiovascular:     Rate and Rhythm: Normal rate and regular rhythm.     Heart sounds: Normal heart sounds, S1 normal and S2 normal. No murmur heard.   No gallop.  Pulmonary:     Effort: Pulmonary effort is normal. No accessory muscle usage or respiratory distress.     Breath sounds: Normal breath sounds.  Abdominal:     General: Bowel sounds are normal.     Palpations: Abdomen is soft.  Musculoskeletal:        General: Normal range of motion.     Cervical back: Normal range of motion and neck supple.     Right lower leg: No edema.     Left lower leg: No edema.  Skin:    General: Skin is warm and dry.     Capillary Refill: Capillary refill takes less than 2 seconds.      Comments: Scattered pale purple/yellow bruises noted to BUE.  Neurological:     Mental Status: He is alert and oriented to person, place, and time.     Coordination: Coordination is intact.     Gait: Gait is intact.     Deep Tendon Reflexes: Reflexes are normal and symmetric.     Reflex Scores:      Brachioradialis reflexes are 2+ on the right side and 2+ on the left side.      Patellar reflexes are 2+ on the right side and 2+ on the left side. Psychiatric:        Mood and Affect: Mood normal.        Behavior: Behavior normal. Behavior is cooperative.        Thought Content: Thought content normal.        Judgment: Judgment normal.    Results for orders placed or performed in visit on 06/04/21  Bayer DCA Hb A1c Waived  Result Value Ref Range   HB A1C (BAYER DCA - WAIVED) 8.5 (H) 4.8 - 5.6 %  Microalbumin, Urine Waived  Result Value Ref Range   Microalb, Ur Waived 10 0 - 19 mg/L   Creatinine, Urine Waived 50 10 - 300 mg/dL   Microalb/Creat Ratio 30-300 (H) <30 mg/g  Comprehensive metabolic panel  Result Value Ref Range   Glucose 202 (H) 70 - 99 mg/dL   BUN 15 8 - 27 mg/dL   Creatinine, Ser 0.85 0.76 - 1.27 mg/dL   eGFR 89 >59 mL/min/1.73   BUN/Creatinine Ratio 18 10 - 24   Sodium 138 134 - 144 mmol/L   Potassium 4.8 3.5 - 5.2 mmol/L   Chloride 100 96 - 106 mmol/L   CO2 26 20 - 29 mmol/L   Calcium 9.5 8.6 - 10.2 mg/dL   Total Protein 6.5 6.0 - 8.5 g/dL   Albumin 4.3 3.7 - 4.7 g/dL   Globulin, Total 2.2 1.5 - 4.5 g/dL   Albumin/Globulin Ratio 2.0 1.2 - 2.2   Bilirubin Total 0.7 0.0 - 1.2 mg/dL  Alkaline Phosphatase 87 44 - 121 IU/L   AST 13 0 - 40 IU/L   ALT 13 0 - 44 IU/L  CBC with Differential/Platelet  Result Value Ref Range   WBC 5.8 3.4 - 10.8 x10E3/uL   RBC 4.47 4.14 - 5.80 x10E6/uL   Hemoglobin 14.6 13.0 - 17.7 g/dL   Hematocrit 43.8 37.5 - 51.0 %   MCV 98 (H) 79 - 97 fL   MCH 32.7 26.6 - 33.0 pg   MCHC 33.3 31.5 - 35.7 g/dL   RDW 13.1 11.6 - 15.4 %    Platelets 278 150 - 450 x10E3/uL   Neutrophils 65 Not Estab. %   Lymphs 26 Not Estab. %   Monocytes 6 Not Estab. %   Eos 2 Not Estab. %   Basos 1 Not Estab. %   Neutrophils Absolute 3.8 1.4 - 7.0 x10E3/uL   Lymphocytes Absolute 1.5 0.7 - 3.1 x10E3/uL   Monocytes Absolute 0.4 0.1 - 0.9 x10E3/uL   EOS (ABSOLUTE) 0.1 0.0 - 0.4 x10E3/uL   Basophils Absolute 0.0 0.0 - 0.2 x10E3/uL   Immature Granulocytes 0 Not Estab. %   Immature Grans (Abs) 0.0 0.0 - 0.1 x10E3/uL  Lipid Panel w/o Chol/HDL Ratio  Result Value Ref Range   Cholesterol, Total 128 100 - 199 mg/dL   Triglycerides 140 0 - 149 mg/dL   HDL 44 >39 mg/dL   VLDL Cholesterol Cal 24 5 - 40 mg/dL   LDL Chol Calc (NIH) 60 0 - 99 mg/dL  TSH  Result Value Ref Range   TSH 4.200 0.450 - 4.500 uIU/mL  VITAMIN D 25 Hydroxy (Vit-D Deficiency, Fractures)  Result Value Ref Range   Vit D, 25-Hydroxy 32.4 30.0 - 100.0 ng/mL  Vitamin B12  Result Value Ref Range   Vitamin B-12 830 232 - 1,245 pg/mL      Assessment & Plan:   Problem List Items Addressed This Visit       Cardiovascular and Mediastinum   Hypertension associated with diabetes ([place]) - Primary   Relevant Medications   sitaGLIPtin (JANUVIA) 100 MG tablet     Endocrine   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  A1c recent 8.5%, trend up due to dietary indiscretions.  Did not tolerate Trulicity, GI issues with this.  Continue Metformin 1000 MG BID and start Januvia 100 MG daily, educated him on this change.  Stop Trulicity due to side effects.  Monitor BS daily in morning at home.  Due to his age and risk for hypoglycemia would avoid return to Glipizide. Could trial SGLT2, but with urinary symptoms may not tolerate. Return to office in May as scheduled.      Relevant Medications   sitaGLIPtin (JANUVIA) 100 MG tablet     Follow up plan: Return for return in May as scheduled.

## 2021-07-02 NOTE — Assessment & Plan Note (Signed)
Chronic, ongoing in patient with history of renal cell CA.  A1c recent 8.5%, trend up due to dietary indiscretions.  Did not tolerate Trulicity, GI issues with this.  Continue Metformin 1000 MG BID and start Januvia 100 MG daily, educated him on this change.  Stop Trulicity due to side effects.  Monitor BS daily in morning at home.  Due to his age and risk for hypoglycemia would avoid return to Glipizide. Could trial SGLT2, but with urinary symptoms may not tolerate. Return to office in May as scheduled.

## 2021-07-24 DIAGNOSIS — Z79899 Other long term (current) drug therapy: Secondary | ICD-10-CM | POA: Diagnosis not present

## 2021-07-24 DIAGNOSIS — M159 Polyosteoarthritis, unspecified: Secondary | ICD-10-CM | POA: Diagnosis not present

## 2021-07-24 DIAGNOSIS — M0609 Rheumatoid arthritis without rheumatoid factor, multiple sites: Secondary | ICD-10-CM | POA: Diagnosis not present

## 2021-07-25 ENCOUNTER — Other Ambulatory Visit: Payer: Self-pay | Admitting: Nurse Practitioner

## 2021-07-26 NOTE — Telephone Encounter (Signed)
Requested Prescriptions  ?Pending Prescriptions Disp Refills  ?? DULoxetine (CYMBALTA) 60 MG capsule [Pharmacy Med Name: DULoxetine HCl 60 MG Oral Capsule Delayed Release Particles] 90 capsule 3  ?  Sig: TAKE 1 CAPSULE BY MOUTH DAILY  ?  ? Psychiatry: Antidepressants - SNRI - duloxetine Passed - 07/25/2021  6:21 AM  ?  ?  Passed - Cr in normal range and within 360 days  ?  Creatinine, Ser  ?Date Value Ref Range Status  ?06/04/2021 0.85 0.76 - 1.27 mg/dL Final  ?   ?  ?  Passed - eGFR is 30 or above and within 360 days  ?  GFR calc Af Amer  ?Date Value Ref Range Status  ?06/02/2020 99 >59 mL/min/1.73 Final  ?  Comment:  ?  **In accordance with recommendations from the NKF-ASN Task force,** ?  [facility] is in the process of updating its eGFR calculation to the ?  2021 CKD-EPI creatinine equation that estimates kidney function ?  without a race variable. ?  ? ?GFR calc non Af Amer  ?Date Value Ref Range Status  ?06/02/2020 85 >59 mL/min/1.73 Final  ? ?eGFR  ?Date Value Ref Range Status  ?06/04/2021 89 >59 mL/min/1.73 Final  ?   ?  ?  Passed - Completed PHQ-2 or PHQ-9 in the last 360 days  ?  ?  Passed - Last BP in normal range  ?  BP Readings from Last 1 Encounters:  ?07/02/21 122/72  ?   ?  ?  Passed - Valid encounter within last 6 months  ?  Recent Outpatient Visits   ?      ? 3 weeks ago Hypertension associated with diabetes ([place])  ? [place], [place]ch[place] T, NP  ? 1 month ago Diabetes mellitus due to underlying condition with diabetic nephropathy, without long-term current use of insulin ([place])  ? [place], [place] T, NP  ? 4 months ago Diabetes mellitus due to underlying condition with diabetic nephropathy, without long-term current use of insulin ([place])  ? [place], Henrine Screws T, NP  ? 8 months ago Diabetes mellitus due to underlying condition with diabetic nephropathy, without long-term current use of insulin ([place])  ? [place],  [place]on T, NP  ? 10 months ago Diabetes mellitus due to underlying condition with diabetic nephropathy, without long-term current use of insulin ([place])  ? [facility] [place], Henrine Screws T, NP  ?  ?  ?Future Appointments   ?        ? In 1 month [name], [name], NP [employer], [facility]  ? In 1 month [name], MD [place]  ? In 6 months  [place]  ?  ? ?  ?  ?  ? ?

## 2021-08-03 ENCOUNTER — Other Ambulatory Visit: Payer: Self-pay | Admitting: Urology

## 2021-08-03 MED ORDER — FINASTERIDE 5 MG PO TABS: 5.0000 mg | ORAL_TABLET | Freq: Every day | ORAL | 0 refills | Status: DC

## 2021-08-06 ENCOUNTER — Other Ambulatory Visit: Payer: Self-pay | Admitting: Nurse Practitioner

## 2021-08-06 DIAGNOSIS — E0821 Diabetes mellitus due to underlying condition with diabetic nephropathy: Secondary | ICD-10-CM

## 2021-08-07 ENCOUNTER — Telehealth: Payer: Medicare Other

## 2021-08-07 ENCOUNTER — Ambulatory Visit (INDEPENDENT_AMBULATORY_CARE_PROVIDER_SITE_OTHER): Payer: Medicare Other

## 2021-08-07 DIAGNOSIS — E0821 Diabetes mellitus due to underlying condition with diabetic nephropathy: Secondary | ICD-10-CM

## 2021-08-07 DIAGNOSIS — I152 Hypertension secondary to endocrine disorders: Secondary | ICD-10-CM

## 2021-08-07 DIAGNOSIS — G894 Chronic pain syndrome: Secondary | ICD-10-CM

## 2021-08-07 DIAGNOSIS — M159 Polyosteoarthritis, unspecified: Secondary | ICD-10-CM

## 2021-08-07 DIAGNOSIS — E1169 Type 2 diabetes mellitus with other specified complication: Secondary | ICD-10-CM

## 2021-08-07 DIAGNOSIS — M0609 Rheumatoid arthritis without rheumatoid factor, multiple sites: Secondary | ICD-10-CM

## 2021-08-07 NOTE — Telephone Encounter (Signed)
Requested medications are due for refill today.  yes ? ?Requested medications are on the active medications list.  yes ? ?Last refill. 05/17/2021 #360 0 refills ? ?Future visit scheduled.   yes ? ?Notes to clinic.  Pharmacy is requesting a 1 year supply. ? ? ? ?Requested Prescriptions  ?Pending Prescriptions Disp Refills  ? metFORMIN (GLUCOPHAGE) 500 MG tablet [Pharmacy Med Name: metFORMIN HCl 500 MG Oral Tablet] 360 tablet 3  ?  Sig: TAKE 2 TABLETS BY MOUTH  TWICE DAILY  ?  ? Endocrinology:  Diabetes - Biguanides Failed - 08/06/2021 10:19 PM  ?  ?  Failed - HBA1C is between 0 and 7.9 and within 180 days  ?  Hemoglobin A1C  ?Date Value Ref Range Status  ?12/27/2015 6.3  Final  ? ?HB A1C (BAYER DCA - WAIVED)  ?Date Value Ref Range Status  ?06/04/2021 8.5 (H) 4.8 - 5.6 % Final  ?  Comment:  ?           Prediabetes: 5.7 - 6.4 ?         Diabetes: >6.4 ?         Glycemic control for adults with diabetes: <7.0 ?  ?  ?  ?  ?  Passed - Cr in normal range and within 360 days  ?  Creatinine, Ser  ?Date Value Ref Range Status  ?06/04/2021 0.85 0.76 - 1.27 mg/dL Final  ?  ?  ?  ?  Passed - eGFR in normal range and within 360 days  ?  GFR calc Af Amer  ?Date Value Ref Range Status  ?06/02/2020 99 >59 mL/min/1.73 Final  ?  Comment:  ?  **In accordance with recommendations from the NKF-ASN Task force,** ?  [facility] is in the process of updating its eGFR calculation to the ?  2021 CKD-EPI creatinine equation that estimates kidney function ?  without a race variable. ?  ? ?GFR calc non Af Amer  ?Date Value Ref Range Status  ?06/02/2020 85 >59 mL/min/1.73 Final  ? ?eGFR  ?Date Value Ref Range Status  ?06/04/2021 89 >59 mL/min/1.73 Final  ?  ?  ?  ?  Passed - B12 Level in normal range and within 720 days  ?  Vitamin B-12  ?Date Value Ref Range Status  ?06/04/2021 830 232 - 1,245 pg/mL Final  ?  ?  ?  ?  Passed - Valid encounter within last 6 months  ?  Recent Outpatient Visits   ? ?      ? 1 month ago Hypertension associated with diabetes  ([place])  ? [place], Henrine Screws T, NP  ? 2 months ago Diabetes mellitus due to underlying condition with diabetic nephropathy, without long-term current use of insulin ([place])  ? Pitkin, [place] T, NP  ? 5 months ago Diabetes mellitus due to underlying condition with diabetic nephropathy, without long-term current use of insulin ([place])  ? [place], Henrine Screws T, NP  ? 8 months ago Diabetes mellitus due to underlying condition with diabetic nephropathy, without long-term current use of insulin ([place])  ? [place], Henrine Screws T, NP  ? 11 months ago Diabetes mellitus due to underlying condition with diabetic nephropathy, without long-term current use of insulin ([place])  ? [facility] [place], Henrine Screws T, NP  ? ?  ?  ?Future Appointments   ? ?        ? In 3 weeks [name], [name], NP [facility]  ? In 1 month [name], MD [place]  ? In 6 months  [place], [facility]  ? ?  ? ?  ?  ?  Passed - CBC within normal limits and completed in the last 12 months  ?  WBC  ?Date Value Ref Range Status  ?06/04/2021 5.8 3.4 - 10.8 x10E3/uL Final  ?10/14/2017 5.8 4.0 - 10.5 K/uL Final  ? ?RBC  ?Date Value Ref Range Status  ?06/04/2021 4.47 4.14 - 5.80 x10E6/uL Final  ?10/14/2017 4.36 4.22 - 5.81 MIL/uL Final  ? ?Hemoglobin  ?Date Value Ref Range Status  ?06/04/2021 14.6 13.0 - 17.7 g/dL Final  ? ?Hematocrit  ?Date Value Ref Range Status  ?06/04/2021 43.8 37.5 - 51.0 % Final  ? ?MCHC  ?Date Value Ref Range Status  ?06/04/2021 33.3 31.5 - 35.7 g/dL Final  ?10/14/2017 33.2 30.0 - 36.0 g/dL Final  ? ?MCH  ?Date Value Ref Range Status  ?06/04/2021 32.7 26.6 - 33.0 pg Final  ?10/14/2017 32.3 26.0 - 34.0 pg Final  ? ?MCV  ?Date Value Ref Range Status  ?06/04/2021 98 (H) 79 - 97 fL Final  ? ?No results found for: PLTCOUNTKUC, LABPLAT, [place] ?RDW  ?Date Value Ref Range Status  ?06/04/2021 13.1 11.6  - 15.4 % Final  ? ?  ?  ?  ?  ?

## 2021-08-07 NOTE — Patient Instructions (Signed)
Visit Information ? ?Thank you for taking time to visit with me today. Please Padraig't hesitate to contact me if I can be of assistance to you before our next scheduled telephone appointment. ? ?Following are the goals we discussed today:  ?RNCM Clinical Goal(s):  ?Patient will verbalize understanding of plan for management of HTN, HLD, DMII, and RA as evidenced by compliance with the plan of care, compliance with dietary restrictions, and working with the pcp and CCM team to optimize health and well being  ?demonstrate understanding of rationale for each prescribed medication as evidenced by compliance with medications and calling for refills before funning out of medications     ?attend all scheduled medical appointments: 09-03-2020 at 220 pm as evidenced by keeping appointments and calling for needed schedule changes         ?demonstrate improved and ongoing adherence to prescribed treatment plan for HTN, HLD, DMII, and RA as evidenced by taking blood sugars as directed, compliance with medications and dietary restrictions, and working with the CCM team to effectively manage health and well being  ?demonstrate a decrease in HTN, HLD, DMII, and RA exacerbations  as evidenced by calling for changes in condition and working with the CCM team and pcp to effectively manage chronic conditions  ?demonstrate ongoing self health care management ability for effective management of chronic conditions  as evidenced by working with the CCM team  through collaboration with Consulting civil engineer, provider, and care team.  ?  ?Interventions: ?1:1 collaboration with primary care provider regarding development and update of comprehensive plan of care as evidenced by provider attestation and co-signature ?Inter-disciplinary care team collaboration (see longitudinal plan of care) ?Evaluation of current treatment plan related to  self management and patient's adherence to plan as established by provider ?  ?  ?Diabetes:  (Status: Goal on track:  NO.) Long Term Goal  ?  ?     ?Lab Results  ?Component Value Date  ?  HGBA1C 8.5 (H) 06/04/2021  ?02-27-2021 A1C 7.5 ?Assessed patient's understanding of A1c goal: <7% ?Provided education to patient about basic DM disease process; ?Reviewed medications with patient and discussed importance of medication adherence. 06-05-2021: The patient is compliant with medications. Saw pcp yesterday and will start new medications. Will return next month for evaluation of A1C and medication changes.  08-07-2021: The patient could not tolerate the Trulicity. It made him stay in the bathroom constantly. He was changed to Januvia 100 mg daily. He states this is working a lot better but his blood sugars are not coming down like he thought they should. Will discuss with the pcp at upcoming visit.     ?Reviewed prescribed diet with patient heart healthy/ADA diet. 06-05-2021: Reviewed to stay away from starchy foods- potatoes, breads, pastas.  The patient does not like sweets but does like breads. Education on monitoring dietary restrictions and eating foods like bread in moderation. The patient states he liked a lot of different foods at Christmas and knows he needs to watch his dietary intake better. Encouraged patient to watch out for foods high in carbs and sugars. 08-07-2021: Discussed heart healthy/ADA diet and to monitor for foods high in carbs and  sweets. ; ?Counseled on importance of regular laboratory monitoring as prescribed. 08-07-2021: Has regular testing. ;        ?Discussed plans with patient for ongoing care management follow up and provided patient with direct contact information for care management team;      ?Provided patient with written  educational materials related to hypo and hyperglycemia and importance of correct treatment. 08-07-2021: The patient denies any lows , is having some elevations in readings. States the lowest he has seen is 135 but usually his blood sugars are ranging 150-170.      ?Reviewed scheduled/upcoming  provider appointments including: 09-03-2021 at 230 pm;         ?Advised patient, providing education and rationale, to check cbg as directed and record. 03-21-2021: The patient has not been consistently checking blood sugars. Education on the benefits of checking blood sugars on a consistent basis. The patient states that he doesn't really know why he does not check. Review of goal of fasting blood sugars <130 and post prandial of <180. The patients last A1C trending down. The patient states he may have to add another medication to his regimen and will talk to the pcp about this in January.  06-05-2021: The patient usually checks blood sugars well after pcp visits and then stops. Education on the benefits of checking blood sugars more frequently. The patient states that if he took blood sugars 6 times a day he could not afford the strips for him and his wife. Education on checking per recommendations of pcp, and if he is feeling different. Review of goals of blood sugars: Fasting <130 and post prandial of <180. The patient states his blood sugars this am was 249. The patient states he got his machine from [facility] and buys strips there. Will collaborate with the pharm D for support in contacting the patient and see what options he has for a meter through his insurance that would provide him with strips also at no or reduced cost. Education provided to the patient. 08-07-2021: The patient is checking his blood sugars more frequently and every am except Saturday and Sunday. He states that he feels like his blood sugars should be better than what they have been but he says they are better tham the 200's he was seeing. Education and review of goals. <130 fasting and <180 post prandial. Education and support given.       ?call provider for findings outside established parameters;       ?Review of patient status, including review of consultants reports, relevant laboratory and other test results, and medications completed.  08-07-2021: Education that the patient likely will have new A1C drawn at upcoming visit and the goal or A1C <7.0       ?Advised patient to discuss medication options  with provider. 08-07-2021: the patient knows to call the pcp or discuss medication changes at upcoming appointments. He feels that he is doing well with the Januvia ;      ?Screening for signs and symptoms of depression related to chronic disease state;        ?Assessed social determinant of health barriers;        ?  ?Hyperlipidemia:  (Status: Goal on Track (progressing): YES.) Long Term Goal  ?     ?Lab Results  ?Component Value Date  ?  CHOL 128 06/04/2021  ?  HDL 44 06/04/2021  ?  [place] 60 06/04/2021  ?  TRIG 140 06/04/2021  ?  CHOLHDL 2.5 03/12/2018  ?  ?  ?Medication review performed; medication list updated in electronic medical record. 08-07-2021: The patient is compliant with medications. Takes Lipitor 20 mg daily as directed.  ?Provider established cholesterol goals reviewed. 08-07-2021: The patient is at goal. Praised for having levels at goal; ?Counseled on importance of regular laboratory monitoring as  prescribed. 08-07-2021: The patient has regular lab work done ?Provided HLD educational materials; ?Reviewed role and benefits of statin for ASCVD risk reduction; ?Discussed strategies to manage statin-induced myalgias; ?Reviewed importance of limiting foods high in cholesterol; ?  ?Hypertension: (Status: Goal on Track (progressing): YES.) ?Last practice recorded BP readings:  ?   ?BP Readings from Last 3 Encounters:  ?07/02/21 122/72  ?06/04/21 106/72  ?02/27/21 128/86  ?Most recent eGFR/CrCl:  ?     ?Lab Results  ?Component Value Date  ?  EGFR 89 06/04/2021  ?  No components found for: CRCL ?  ?Evaluation of current treatment plan related to hypertension self management and patient's adherence to plan as established by provider. 08-07-2021: The patient states that he has not new concerns with HTN or heart health. Will continue to monitor.     ?Provided education to patient re: stroke prevention, s/s of heart attack and stroke; ?Reviewed prescribed diet heart healthy/ADA diet . 08-07-2021: Reviewed with the patient.  ?Reviewed medications with patient and

## 2021-08-07 NOTE — Chronic Care Management (AMB) (Signed)
?Chronic Care Management  ? ?CCM RN Visit Note ? ?08/07/2021 ?Name: Jason Contreras MRN: [id number] DOB: Jun 29, 1940 ? ?Subjective: ?Jason Contreras is a 81 y.o. year old male who is a primary care patient of [name], [name], NP. The care management team was consulted for assistance with disease management and care coordination needs.   ? ?Engaged with patient by telephone for follow up visit in response to provider referral for case management and/or care coordination services.  ? ?Consent to Services:  ?The patient was given information about Chronic Care Management services, agreed to services, and gave verbal consent prior to initiation of services.  Please see initial visit note for detailed documentation.  ? ?Patient agreed to services and verbal consent obtained.  ? ?Assessment: Review of patient past medical history, allergies, medications, health status, including review of consultants reports, laboratory and other test data, was performed as part of comprehensive evaluation and provision of chronic care management services.  ? ?SDOH (Social Determinants of Health) assessments and interventions performed:   ? ?CCM Care Plan ? ?Allergies  ?Allergen Reactions  ? Morphine Nausea And Vomiting  ? Morphine And Related Nausea And Vomiting  ? ? ?Outpatient Encounter Medications as of 08/07/2021  ?Medication Sig  ? acetaminophen (TYLENOL) 325 MG tablet Take 650 mg by mouth every 6 (six) hours as needed. Takes at bedtime and occasionally in am for back/hip pain  ? atorvastatin (LIPITOR) 20 MG tablet TAKE 1 TABLET BY MOUTH  DAILY AT 6 PM.  ? B Complex-C-Folic Acid (SUPER B COMPLEX/FA/VIT C PO) Take 1 tablet by mouth daily. Contains 440 mcg folic acid  ? carvedilol (COREG) 6.25 MG tablet TAKE 1 TABLET BY MOUTH  TWICE DAILY  ? cholecalciferol (VITAMIN D3) 25 MCG (1000 UNIT) tablet Take 2,000 Units by mouth daily.   ? DULoxetine (CYMBALTA) 60 MG capsule TAKE 1 CAPSULE BY MOUTH DAILY  ? finasteride (PROSCAR) 5 MG tablet Take 1  tablet (5 mg total) by mouth daily.  ? folic acid (FOLVITE) 1 MG tablet Take 1 tablet (1 mg total) by mouth daily.  ? gabapentin (NEURONTIN) 600 MG tablet TAKE 1 TABLET BY MOUTH 3  TIMES DAILY  ? lidocaine (LMX) 4 % cream Apply 1 application topically as needed.  ? metFORMIN (GLUCOPHAGE) 500 MG tablet TAKE 2 TABLETS BY MOUTH  TWICE DAILY  ? methotrexate (RHEUMATREX) 2.5 MG tablet Take 2.5 mg by mouth once a week. 6 tabs weekly  ? mometasone (ELOCON) 0.1 % cream APPLY  CREAM TOPICALLY ONCE DAILY  ? sitaGLIPtin (JANUVIA) 100 MG tablet Take 1 tablet (100 mg total) by mouth daily.  ? tamsulosin (FLOMAX) 0.4 MG CAPS capsule TAKE 1 CAPSULE BY MOUTH  DAILY  ? vitamin C (ASCORBIC ACID) 500 MG tablet Take 500 mg by mouth daily.  ? ?No facility-administered encounter medications on file as of 08/07/2021.  ? ? ?Patient Active Problem List  ? Diagnosis Date Noted  ? Aortic atherosclerosis ([place]) 05/26/2020  ? Rheumatoid arthritis of multiple sites with negative rheumatoid factor ([place]) 12/08/2019  ? B12 deficiency 07/12/2019  ? Vitamin D deficiency 06/30/2019  ? Personal history of renal cancer 10/17/2017  ? Advanced care planning/counseling discussion 01/13/2017  ? Senile purpura ([place]) 09/26/2016  ? Elevated PSA 01/07/2015  ? BPH with obstruction/lower urinary tract symptoms 01/07/2015  ? DM due to underlying condition with diabetic nephropathy ([place])   ? Hypertension associated with diabetes ([place])   ? Hyperlipidemia associated with type 2 diabetes mellitus ([place])   ? ? ?  Conditions to be addressed/monitored:HTN, HLD, DMII, Osteoarthritis, and RA ? ?Care Plan : RNCM; General Plan of Care (Adult) for Chronic Disease Management and Care Coordination Needs  ?Updates made by Vanita Ingles, RN since 08/07/2021 12:00 AM  ?  ? ?Problem: RNCM: Development of Plan of Care for Chronic Disease Management (RA, HTN, HLD, DM)   ?Priority: High  ?  ? ?Long-Range Goal: RNCM: Effective Management  of Plan of Care for Chronic Disease Management (RA,  HTN, HLD, DM)   ?Start Date: 03/21/2021  ?Expected End Date: 03/21/2022  ?Priority: High  ?Note:   ?Current Barriers:  ?Knowledge Deficits related to plan of care for management of HTN, HLD, DMII, and RA  ?Chronic Disease Management support and education needs related to HTN, HLD, DMII, and RA ? ?RNCM Clinical Goal(s):  ?Patient will verbalize understanding of plan for management of HTN, HLD, DMII, and RA as evidenced by compliance with the plan of care, compliance with dietary restrictions, and working with the pcp and CCM team to optimize health and well being  ?demonstrate understanding of rationale for each prescribed medication as evidenced by compliance with medications and calling for refills before funning out of medications     ?attend all scheduled medical appointments: 09-03-2020 at 220 pm as evidenced by keeping appointments and calling for needed schedule changes         ?demonstrate improved and ongoing adherence to prescribed treatment plan for HTN, HLD, DMII, and RA as evidenced by taking blood sugars as directed, compliance with medications and dietary restrictions, and working with the CCM team to effectively manage health and well being  ?demonstrate a decrease in HTN, HLD, DMII, and RA exacerbations  as evidenced by calling for changes in condition and working with the CCM team and pcp to effectively manage chronic conditions  ?demonstrate ongoing self health care management ability for effective management of chronic conditions  as evidenced by working with the CCM team  through collaboration with Consulting civil engineer, provider, and care team.  ? ?Interventions: ?1:1 collaboration with primary care provider regarding development and update of comprehensive plan of care as evidenced by provider attestation and co-signature ?Inter-disciplinary care team collaboration (see longitudinal plan of care) ?Evaluation of current treatment plan related to  self management and patient's adherence to plan as  established by provider ? ? ?Diabetes:  (Status: Goal on track: NO.) Long Term Goal  ? ?Lab Results  ?Component Value Date  ? HGBA1C 8.5 (H) 06/04/2021  ?02-27-2021 A1C 7.5 ?Assessed patient's understanding of A1c goal: <7% ?Provided education to patient about basic DM disease process; ?Reviewed medications with patient and discussed importance of medication adherence. 06-05-2021: The patient is compliant with medications. Saw pcp yesterday and will start new medications. Will return next month for evaluation of A1C and medication changes.  08-07-2021: The patient could not tolerate the Trulicity. It made him stay in the bathroom constantly. He was changed to Januvia 100 mg daily. He states this is working a lot better but his blood sugars are not coming down like he thought they should. Will discuss with the pcp at upcoming visit.     ?Reviewed prescribed diet with patient heart healthy/ADA diet. 06-05-2021: Reviewed to stay away from starchy foods- potatoes, breads, pastas.  The patient does not like sweets but does like breads. Education on monitoring dietary restrictions and eating foods like bread in moderation. The patient states he liked a lot of different foods at Christmas and knows he needs to watch  his dietary intake better. Encouraged patient to watch out for foods high in carbs and sugars. 08-07-2021: Discussed heart healthy/ADA diet and to monitor for foods high in carbs and  sweets. ; ?Counseled on importance of regular laboratory monitoring as prescribed. 08-07-2021: Has regular testing. ;        ?Discussed plans with patient for ongoing care management follow up and provided patient with direct contact information for care management team;      ?Provided patient with written educational materials related to hypo and hyperglycemia and importance of correct treatment. 08-07-2021: The patient denies any lows , is having some elevations in readings. States the lowest he has seen is 135 but usually his blood  sugars are ranging 150-170.      ?Reviewed scheduled/upcoming provider appointments including: 09-03-2021 at 230 pm;         ?Advised patient, providing education and rationale, to check cbg as directed and record. 1

## 2021-08-16 IMAGING — CR DG CHEST 2V
1 series · 2 of 2 positions shown · non-contrast
Comparison: Chest x-ray dated 11/12/2018

CLINICAL DATA: Weight loss. History of renal cell carcinoma.

EXAM:
CHEST - 2 VIEW

[Series 1: dg chest 2 view · 0.14mm/px · 2 of 2 slices shown]
[im 1/2]
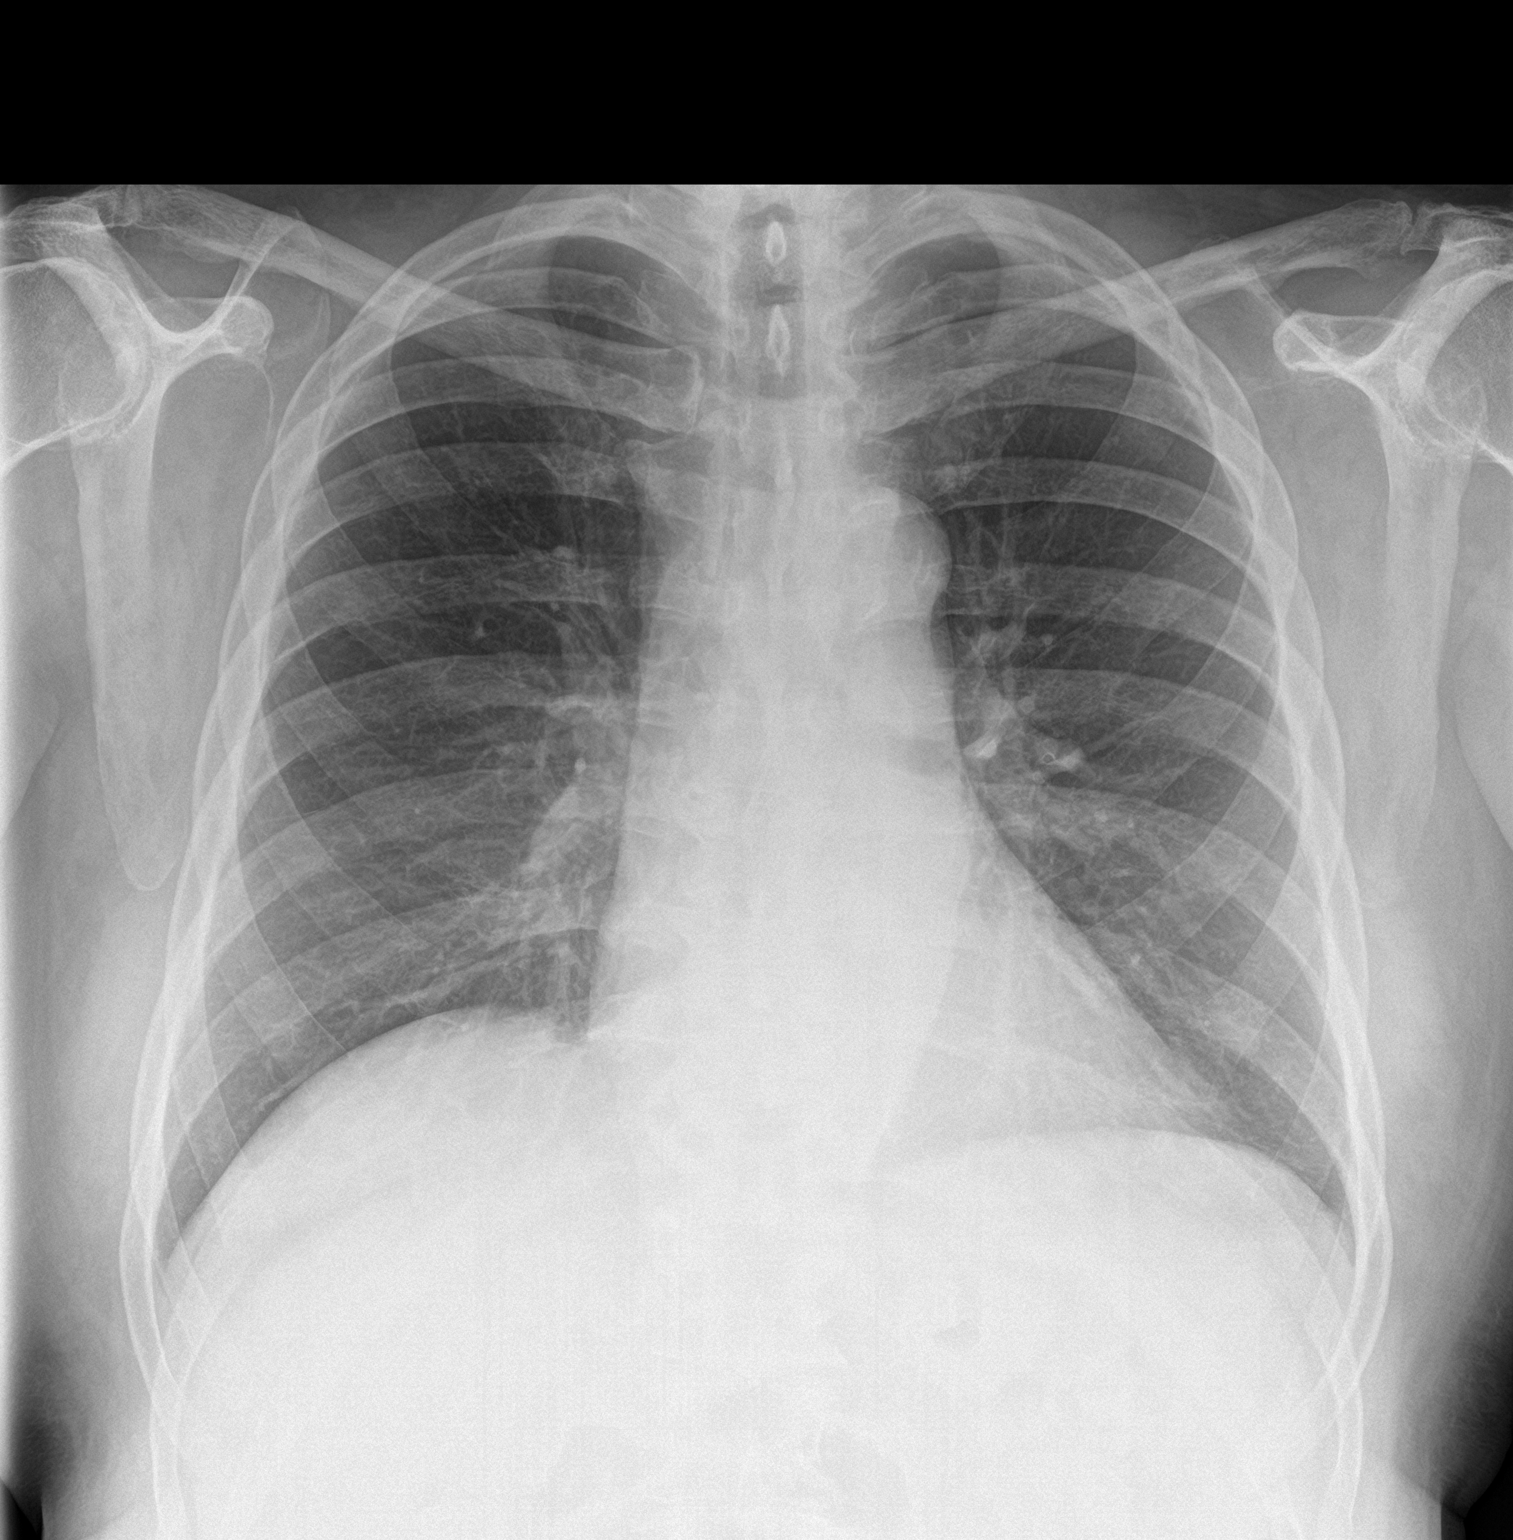
[im 2/2]
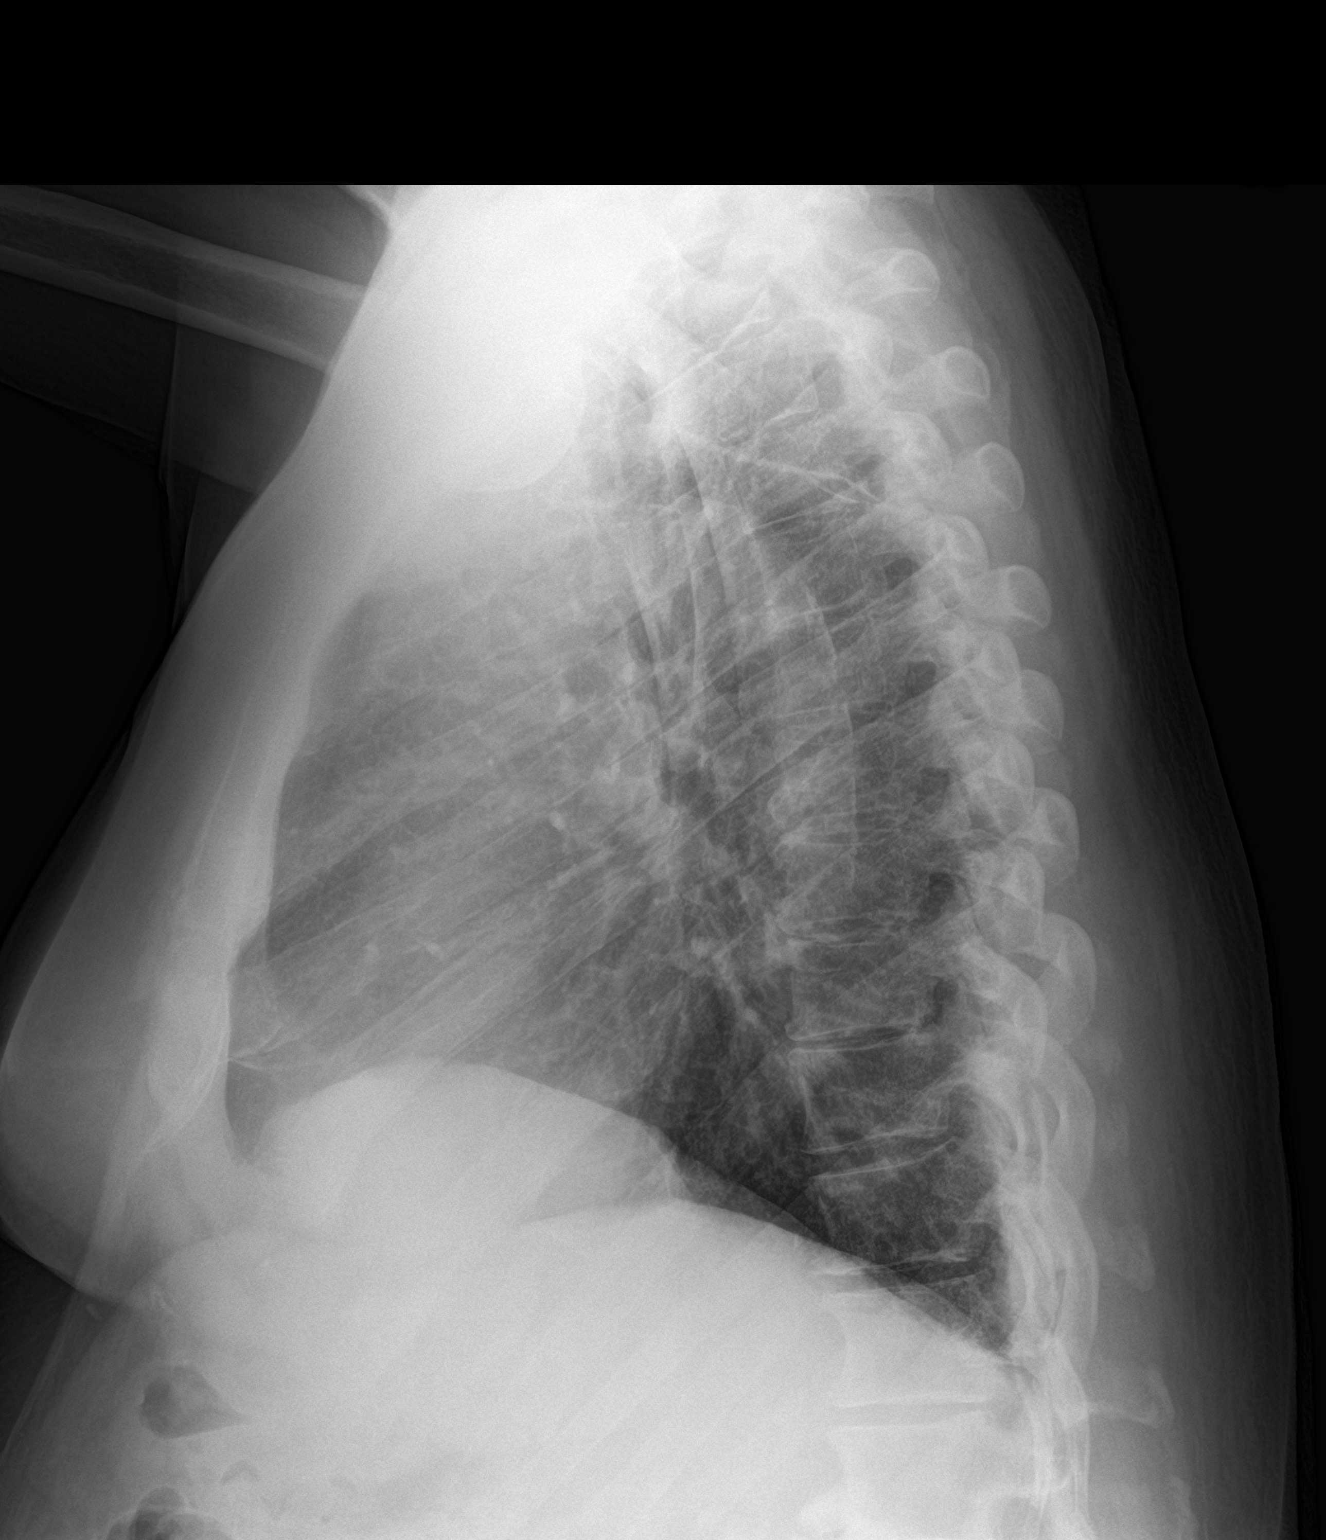

[2 of 2 positions shown; findings below may reference images not displayed]

FINDINGS: The heart size and pulmonary vascularity are normal and the lungs
are clear. No infiltrates or effusions or findings of metastatic
disease. Aortic atherosclerosis.

No acute bone abnormality. Slight reversal of the lower thoracic
kyphosis.
IMPRESSION: No acute abnormalities.

Aortic Atherosclerosis ([id number].I).

## 2021-08-22 ENCOUNTER — Other Ambulatory Visit: Payer: Self-pay | Admitting: Nurse Practitioner

## 2021-08-22 DIAGNOSIS — I10 Essential (primary) hypertension: Secondary | ICD-10-CM

## 2021-08-23 NOTE — Telephone Encounter (Signed)
Requested Prescriptions  ?Pending Prescriptions Disp Refills  ?? carvedilol (COREG) 6.25 MG tablet [Pharmacy Med Name: Carvedilol 6.25 MG Oral Tablet] 180 tablet 3  ?  Sig: TAKE 1 TABLET BY MOUTH TWICE  DAILY  ?  ? Cardiovascular: Beta Blockers 3 Passed - 08/22/2021 10:49 PM  ?  ?  Passed - Cr in normal range and within 360 days  ?  Creatinine, Ser  ?Date Value Ref Range Status  ?06/04/2021 0.85 0.76 - 1.27 mg/dL Final  ?   ?  ?  Passed - AST in normal range and within 360 days  ?  AST  ?Date Value Ref Range Status  ?06/04/2021 13 0 - 40 IU/L Final  ? ?AST (SGOT) Piccolo, Waived  ?Date Value Ref Range Status  ?10/13/2018 16 11 - 38 U/L Final  ?   ?  ?  Passed - ALT in normal range and within 360 days  ?  ALT  ?Date Value Ref Range Status  ?06/04/2021 13 0 - 44 IU/L Final  ? ?ALT (SGPT) Piccolo, Waived  ?Date Value Ref Range Status  ?10/13/2018 19 10 - 47 U/L Final  ?   ?  ?  Passed - Last BP in normal range  ?  BP Readings from Last 1 Encounters:  ?07/02/21 122/72  ?   ?  ?  Passed - Last Heart Rate in normal range  ?  Pulse Readings from Last 1 Encounters:  ?07/02/21 76  ?   ?  ?  Passed - Valid encounter within last 6 months  ?  Recent Outpatient Visits   ?      ? 1 month ago Hypertension associated with diabetes ([place])  ? [place], Henrine Screws T, NP  ? 2 months ago Diabetes mellitus due to underlying condition with diabetic nephropathy, without long-term current use of insulin ([place])  ? [place], [place] T, NP  ? 5 months ago Diabetes mellitus due to underlying condition with diabetic nephropathy, without long-term current use of insulin ([place])  ? [place], Henrine Screws T, NP  ? 8 months ago Diabetes mellitus due to underlying condition with diabetic nephropathy, without long-term current use of insulin ([place])  ? [place], Henrine Screws T, NP  ? 11 months ago Diabetes mellitus due to underlying condition with diabetic nephropathy, without  long-term current use of insulin ([place])  ? [facility] [place], Henrine Screws T, NP  ?  ?  ?Future Appointments   ?        ? In 1 week Venita Lick, NP [employer] [facility], [facility]  ? In 2 weeks [name], MD [place]  ? In 5 months  [place]  ?  ? ?  ?  ?  ? ?

## 2021-09-01 NOTE — Patient Instructions (Signed)

## 2021-09-02 ENCOUNTER — Other Ambulatory Visit: Payer: Self-pay | Admitting: Nurse Practitioner

## 2021-09-02 DIAGNOSIS — I10 Essential (primary) hypertension: Secondary | ICD-10-CM

## 2021-09-02 DIAGNOSIS — E785 Hyperlipidemia, unspecified: Secondary | ICD-10-CM

## 2021-09-02 DIAGNOSIS — E1159 Type 2 diabetes mellitus with other circulatory complications: Secondary | ICD-10-CM

## 2021-09-02 DIAGNOSIS — Z7984 Long term (current) use of oral hypoglycemic drugs: Secondary | ICD-10-CM

## 2021-09-03 ENCOUNTER — Encounter: Payer: Self-pay | Admitting: Nurse Practitioner

## 2021-09-03 ENCOUNTER — Ambulatory Visit (INDEPENDENT_AMBULATORY_CARE_PROVIDER_SITE_OTHER): Payer: Medicare Other | Admitting: Nurse Practitioner

## 2021-09-03 VITALS — BP 122/71 | HR 74 | Temp 97.8°F | Ht 68.25 in | Wt 201.4 lb

## 2021-09-03 DIAGNOSIS — Z85528 Personal history of other malignant neoplasm of kidney: Secondary | ICD-10-CM | POA: Diagnosis not present

## 2021-09-03 DIAGNOSIS — M0609 Rheumatoid arthritis without rheumatoid factor, multiple sites: Secondary | ICD-10-CM

## 2021-09-03 DIAGNOSIS — Z23 Encounter for immunization: Secondary | ICD-10-CM | POA: Diagnosis not present

## 2021-09-03 DIAGNOSIS — D692 Other nonthrombocytopenic purpura: Secondary | ICD-10-CM | POA: Diagnosis not present

## 2021-09-03 DIAGNOSIS — I700 Atherosclerosis of aorta: Secondary | ICD-10-CM

## 2021-09-03 DIAGNOSIS — E785 Hyperlipidemia, unspecified: Secondary | ICD-10-CM

## 2021-09-03 DIAGNOSIS — R972 Elevated prostate specific antigen [PSA]: Secondary | ICD-10-CM

## 2021-09-03 DIAGNOSIS — I152 Hypertension secondary to endocrine disorders: Secondary | ICD-10-CM | POA: Diagnosis not present

## 2021-09-03 DIAGNOSIS — E1169 Type 2 diabetes mellitus with other specified complication: Secondary | ICD-10-CM

## 2021-09-03 DIAGNOSIS — E0821 Diabetes mellitus due to underlying condition with diabetic nephropathy: Secondary | ICD-10-CM | POA: Diagnosis not present

## 2021-09-03 DIAGNOSIS — E1159 Type 2 diabetes mellitus with other circulatory complications: Secondary | ICD-10-CM

## 2021-09-03 LAB — BAYER DCA HB A1C WAIVED: HB A1C (BAYER DCA - WAIVED): 6.7 % — ABNORMAL HIGH (ref 4.8–5.6)

## 2021-09-03 NOTE — Assessment & Plan Note (Addendum)
Noted on CXR 07/20/19.  Continue statin and ASA daily for prevention.  Monitor closely.  Educated patient on this. ?

## 2021-09-03 NOTE — Assessment & Plan Note (Signed)
Chronic, stable.  Recommend continue to perform gentle skin care and monitor for skin breakdown, if present notify provider. ?

## 2021-09-03 NOTE — Assessment & Plan Note (Signed)
Chronic, ongoing in patient with history of renal cell CA.  A1c today trend down to 6.7%, from 8.5% previous.  Did not tolerate Trulicity, GI issues with this.  Continue Metformin 1000 MG BID and Januvia 100 MG daily, educated him on these.  Monitor BS daily in morning at home.  Due to his age and risk for hypoglycemia would avoid return to Glipizide. Could trial SGLT2, but with urinary symptoms may not tolerate. Return to office 3 months. ?

## 2021-09-03 NOTE — Assessment & Plan Note (Signed)
Followed by urology, continue this collaboration and review notes when present. ? ?

## 2021-09-03 NOTE — Progress Notes (Signed)
? ?BP 122/71   Pulse 74   Temp 97.8 ?F (36.6 ?C) (Oral)   Ht 5' 8.25" (1.734 m)   Wt 201 lb 6.4 oz (91.4 kg)   SpO2 98%   BMI 30.40 kg/m?   ? ?Subjective:  ? ? Patient ID: JAMALE Contreras, male    DOB: 07-18-1940, 81 y.o.   MRN: [id number] ? ?HPI: ?Jason Contreras is a 81 y.o. male ? ?Chief Complaint  ?Patient presents with  ? Hypertension  ? Hyperlipidemia  ? Diabetes  ? Rheumatoid Arthritis  ? Prostate Cancer  ? ?DIABETES ?Taking Metformin 1000 MG BID and added Januvia 100 MG daily, discontinued Actos and Amaryl over past years due to age and heart health.  A1c in January was 8.5%, trend up due to diet and we tried Trulicity which caused GI upset and changed to [place].  Does endorse ongoing dietary indiscretions -- apple pie. ?Hypoglycemic episodes:no ?Polydipsia/polyuria: no ?Visual disturbance: no ?Chest pain: no ?Paresthesias: no ?Glucose Monitoring: yes ?            Accucheck frequency: occasional ?            Fasting glucose: 130 to 170 ?            Post prandial: ?            Evening: ?            Before meals: ?Taking Insulin?: no ?            Long acting insulin: ?            Short acting insulin: ?Blood Pressure Monitoring: daily ?Retinal Examination: Not up to Date ?Foot Exam: Up to Date ?Pneumovax: Not up to Date ?Influenza: Up to Date ?Aspirin: no  ?  ?HYPERTENSION / HYPERLIPIDEMIA ?Taking Coreg 6.25 MG BID + Atorvastatin 20 MG daily.  ?Satisfied with current treatment? yes ?Duration of hypertension: chronic ?BP monitoring frequency: daily ?BP range: <130/80 average at home ?BP medication side effects: no ?Duration of hyperlipidemia: chronic ?Cholesterol medication side effects: no ?Cholesterol supplements: none ?Medication compliance: good compliance ?Aspirin: no ?Recent stressors: no ?Recurrent headaches: no ?Visual changes: no ?Palpitations: no ?Dyspnea: no ?Chest pain: no ?Lower extremity edema: no ?Dizzy/lightheaded: no  ?  ?RHEUMATOID ARTHRITIS ?Followed by rheumatology for RA of multiple  sites with negative Rf.  Last visit 07/24/21 Taking Methotrexate and Duloxetine + Folic Acid.  Does have occasional pain still, but not to the severity of past pain. ?Pain control status: stable ?Duration: chronic ?Location: multiple areas ?What Activities task can be accomplished with current medication? ADL's daily ?Previous pain specialty evaluation: yes ?Non-narcotic analgesic meds: yes ?  ?Relevant past medical, surgical, family and social history reviewed and updated as indicated. Interim medical history since our last visit reviewed. ?Allergies and medications reviewed and updated. ? ?Review of Systems  ?Constitutional:  Negative for activity change, diaphoresis, fatigue and fever.  ?Respiratory:  Negative for cough, chest tightness, shortness of breath and wheezing.   ?Cardiovascular:  Negative for chest pain, palpitations and leg swelling.  ?Gastrointestinal: Negative.   ?Endocrine: Negative for polydipsia, polyphagia and polyuria.  ?Neurological:  Negative for dizziness, syncope, weakness and numbness.  ?Psychiatric/Behavioral: Negative.    ? ?Per HPI unless specifically indicated above ? ?   ?Objective:  ?  ?BP 122/71   Pulse 74   Temp 97.8 ?F (36.6 ?C) (Oral)   Ht 5' 8.25" (1.734 m)   Wt 201 lb 6.4 oz (91.4 kg)  SpO2 98%   BMI 30.40 kg/m?   ?Wt Readings from Last 3 Encounters:  ?09/03/21 201 lb 6.4 oz (91.4 kg)  ?07/02/21 199 lb 6.4 oz (90.4 kg)  ?06/04/21 200 lb 3.2 oz (90.8 kg)  ?  ?Physical Exam ?Vitals and nursing note reviewed.  ?Constitutional:   ?   General: He is awake.  ?   Appearance: He is well-developed and overweight.  ?HENT:  ?   Head: Normocephalic and atraumatic.  ?   Right Ear: Hearing normal. No drainage.  ?   Left Ear: Hearing normal. No drainage.  ?Eyes:  ?   General: Lids are normal.     ?   Right eye: No discharge.     ?   Left eye: No discharge.  ?   Conjunctiva/sclera: Conjunctivae normal.  ?   Pupils: Pupils are equal, round, and reactive to light.  ?Neck:  ?   Thyroid: No  thyromegaly.  ?   Vascular: No carotid bruit or JVD.  ?   Trachea: Trachea normal.  ?Cardiovascular:  ?   Rate and Rhythm: Normal rate and regular rhythm.  ?   Heart sounds: Normal heart sounds, S1 normal and S2 normal. No murmur heard. ?  No gallop.  ?Pulmonary:  ?   Effort: Pulmonary effort is normal. No accessory muscle usage or respiratory distress.  ?   Breath sounds: Normal breath sounds.  ?Abdominal:  ?   General: Bowel sounds are normal.  ?   Palpations: Abdomen is soft.  ?Musculoskeletal:     ?   General: Normal range of motion.  ?   Cervical back: Normal range of motion and neck supple.  ?   Right lower leg: No edema.  ?   Left lower leg: No edema.  ?Skin: ?   General: Skin is warm and dry.  ?   Capillary Refill: Capillary refill takes less than 2 seconds.  ?   Comments: Scattered pale purple/yellow bruises noted to BUE.  ?Neurological:  ?   Mental Status: He is alert and oriented to person, place, and time.  ?   Coordination: Coordination is intact.  ?   Gait: Gait is intact.  ?   Deep Tendon Reflexes: Reflexes are normal and symmetric.  ?   Reflex Scores: ?     Brachioradialis reflexes are 2+ on the right side and 2+ on the left side. ?     Patellar reflexes are 2+ on the right side and 2+ on the left side. ?Psychiatric:     ?   Mood and Affect: Mood normal.     ?   Behavior: Behavior normal. Behavior is cooperative.     ?   Thought Content: Thought content normal.     ?   Judgment: Judgment normal.  ? ? ?Results for orders placed or performed in visit on 09/03/21  ?Bayer DCA Hb A1c Waived  ?Result Value Ref Range  ? HB A1C (BAYER DCA - WAIVED) 6.7 (H) 4.8 - 5.6 %  ? ?   ?Assessment & Plan:  ? ?Problem List Items Addressed This Visit   ? ?  ? Cardiovascular and Mediastinum  ? Aortic atherosclerosis ([place])  ?  Noted on CXR 07/20/19.  Continue statin and ASA daily for prevention.  Monitor closely.  Educated patient on this. ? ?  ?  ? Relevant Orders  ? Basic metabolic panel  ? Hypertension associated with  diabetes ([place])  ?  Chronic, stable with BP at goal  today in office and at goal on home readings when checks.  Recommend he monitor BP at home at least three mornings a week + focus on DASH diet.  Continue current medication regimen and adjust as needed.  Labs: A1c and BMP.  Return in 3 months. ? ?  ?  ? Relevant Orders  ? Basic metabolic panel  ? Bayer DCA Hb A1c Waived (Completed)  ? Senile purpura ([place])  ?  Chronic, stable.  Recommend continue to perform gentle skin care and monitor for skin breakdown, if present notify provider. ? ?  ?  ?  ? Endocrine  ? DM due to underlying condition with diabetic nephropathy ([place]) - Primary  ?  Chronic, ongoing in patient with history of renal cell CA.  A1c today trend down to 6.7%, from 8.5% previous.  Did not tolerate Trulicity, GI issues with this.  Continue Metformin 1000 MG BID and Januvia 100 MG daily, educated him on these.  Monitor BS daily in morning at home.  Due to his age and risk for hypoglycemia would avoid return to Glipizide. Could trial SGLT2, but with urinary symptoms may not tolerate. Return to office 3 months. ? ?  ?  ? Relevant Orders  ? Basic metabolic panel  ? Bayer DCA Hb A1c Waived (Completed)  ? Hyperlipidemia associated with type 2 diabetes mellitus ([place])  ?  Chronic, ongoing.  Continue current medication regimen and adjust as needed.  Lipid panel up to date. ? ?  ?  ? Relevant Orders  ? Bayer DCA Hb A1c Waived (Completed)  ?  ? Musculoskeletal and Integument  ? Rheumatoid arthritis of multiple sites with negative rheumatoid factor ([place])  ?  Chronic, ongoing.  Followed by rheumatology with much improved functional status since initially meeting patient a couple years ago.  Continue this collaboration and current regimen as prescribed by them.  Recent note reviewed. ? ?  ?  ?  ? Other  ? Elevated PSA  ?  Continue collaboration with urology.  Recent note reviewed.  PSA monitored by them, but they requested add on to labs today.  Will add this on for  them. ? ?  ?  ? Relevant Orders  ? PSA  ? Personal history of renal cancer  ?  Followed by urology, continue this collaboration and review notes when present. ? ? ?  ?  ? ?Other Visit Diagnoses   ? ? Need for Td vaccine

## 2021-09-03 NOTE — Assessment & Plan Note (Signed)
Chronic, stable with BP at goal today in office and at goal on home readings when checks.  Recommend he monitor BP at home at least three mornings a week + focus on DASH diet.  Continue current medication regimen and adjust as needed.  Labs: A1c and BMP.  Return in 3 months. ?

## 2021-09-03 NOTE — Assessment & Plan Note (Signed)
Chronic, ongoing.  Followed by rheumatology with much improved functional status since initially meeting patient a couple years ago.  Continue this collaboration and current regimen as prescribed by them.  Recent note reviewed. ?

## 2021-09-03 NOTE — Telephone Encounter (Signed)
Requested Prescriptions  ?Pending Prescriptions Disp Refills  ?? JANUVIA 100 MG tablet [Pharmacy Med Name: Januvia 100 MG Oral Tablet] 90 tablet 1  ?  Sig: TAKE 1 TABLET BY MOUTH DAILY  ?  ? Endocrinology:  Diabetes - DPP-4 Inhibitors Failed - 09/02/2021  8:04 AM  ?  ?  Failed - HBA1C is between 0 and 7.9 and within 180 days  ?  Hemoglobin A1C  ?Date Value Ref Range Status  ?12/27/2015 6.3  Final  ? ?HB A1C (BAYER DCA - WAIVED)  ?Date Value Ref Range Status  ?09/03/2021 6.7 (H) 4.8 - 5.6 % Final  ?  Comment:  ?           Prediabetes: 5.7 - 6.4 ?         Diabetes: >6.4 ?         Glycemic control for adults with diabetes: <7.0 ?  ?   ?  ?  Passed - Cr in normal range and within 360 days  ?  Creatinine, Ser  ?Date Value Ref Range Status  ?06/04/2021 0.85 0.76 - 1.27 mg/dL Final  ?   ?  ?  Passed - Valid encounter within last 6 months  ?  Recent Outpatient Visits   ?      ? Today Diabetes mellitus due to underlying condition with diabetic nephropathy, without long-term current use of insulin ([place])  ? [place], [place] T, NP  ? 2 months ago Hypertension associated with diabetes ([place])  ? [place], [place] T, NP  ? 3 months ago Diabetes mellitus due to underlying condition with diabetic nephropathy, without long-term current use of insulin ([place])  ? [place], Henrine Screws T, NP  ? 6 months ago Diabetes mellitus due to underlying condition with diabetic nephropathy, without long-term current use of insulin ([place])  ? [place], Henrine Screws T, NP  ? 9 months ago Diabetes mellitus due to underlying condition with diabetic nephropathy, without long-term current use of insulin ([place])  ? [facility] [place], Henrine Screws T, NP  ?  ?  ?Future Appointments   ?        ? In 1 week [name], MD [place]  ? In 3 months [name], [name], NP [employer] [facility]  ? In 5 months  [place]  ?   ? ?  ?  ?  ? ?

## 2021-09-03 NOTE — Assessment & Plan Note (Signed)
Chronic, ongoing.  Continue current medication regimen and adjust as needed.  Lipid panel up to date. 

## 2021-09-03 NOTE — Assessment & Plan Note (Signed)
Continue collaboration with urology.  Recent note reviewed.  PSA monitored by them, but they requested add on to labs today.  Will add this on for them. ?

## 2021-09-04 LAB — BASIC METABOLIC PANEL
BUN/Creatinine Ratio: 19 (ref 10–24)
BUN: 15 mg/dL (ref 8–27)
CO2: 25 mmol/L (ref 20–29)
Calcium: 9.4 mg/dL (ref 8.6–10.2)
Chloride: 99 mmol/L (ref 96–106)
Creatinine, Ser: 0.77 mg/dL (ref 0.76–1.27)
Glucose: 194 mg/dL — ABNORMAL HIGH (ref 70–99)
Potassium: 5 mmol/L (ref 3.5–5.2)
Sodium: 140 mmol/L (ref 134–144)
eGFR: 92 mL/min/{1.73_m2} (ref >59)

## 2021-09-04 LAB — PSA: Prostate Specific Ag, Serum: 3.5 ng/mL (ref 0.0–4.0)

## 2021-09-04 NOTE — Progress Notes (Signed)
Contacted via [place] ? ? ?Good evening Jason Contreras, your labs have returned: ?- Kidney function, creatinine and eGFR, remains normal. ?- PSA remains stable and has come down some from previous, now at 3.5.  Any questions? ?Keep being awesome!!  Thank you for allowing me to participate in your care.  I appreciate you. ?Kindest regards, ?Obert Espindola ?

## 2021-09-07 ENCOUNTER — Ambulatory Visit
Admission: RE | Admit: 2021-09-07 | Discharge: 2021-09-07 | Disposition: A | Discharge status: Home / Self Care | Payer: Medicare Other | Source: Ambulatory Visit | Attending: Urology | Admitting: Urology

## 2021-09-07 ENCOUNTER — Other Ambulatory Visit: Payer: Self-pay

## 2021-09-07 DIAGNOSIS — I700 Atherosclerosis of aorta: Secondary | ICD-10-CM | POA: Diagnosis not present

## 2021-09-07 DIAGNOSIS — C641 Malignant neoplasm of right kidney, except renal pelvis: Secondary | ICD-10-CM

## 2021-09-07 MED ORDER — IOHEXOL 300 MG/ML  SOLN
100.0000 mL | Freq: Once | INTRAMUSCULAR | Status: AC | PRN
  Administered 2021-09-07: 100 mL via INTRAVENOUS

## 2021-09-10 NOTE — Progress Notes (Addendum)
? ?09/11/2021 ?12:59 PM  ? ?Jason Contreras ?[birth year] ?[id number] ? ?Referring provider:  ?Venita Lick, NP ?[address] [place],  [place] [address] ?Chief Complaint  ?Patient presents with  ? Benign Prostatic Hypertrophy  ? ? ?HPI: ?Jason Contreras is a 81 y.o.male with a personal history of renal cell carcinoma, hematuria, right renal mass, and elevated PSA , who presents today for a 1 year follow-up with IPSS, DRE, PSA, PVR, and CT results.  ? ?He is s/p prostate biopsy in 2017 that was negative for malignancy. TRUS 41g.  ? ?His MRI of prostate from 07/23/19 revealed PI-RADS category 5 lesion of the right anterior transition zone and right anterior fibromuscular stroma at the prostate base and mid gland. Lesion measures 2.1 by 1.4 by 2.2 cm.  ?  ?He underwent a prostate bx on 09/02/19 which was unremarkable, no evidence of malignancy. ? ?His most recent PSA was 3.5 on 09/03/2021. PSA trend as below.  ? 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 Most recent CT was 09/06/2020, which showed changed of prior right lower pole partial nephrectomy and cryoablation without evidence of recurrence or abdominopelvic metastases. ? ?He underwent a CT abdomen and pelvis on 09/07/2021 that visualized unchanged postoperative and post ablation appearance of the ?inferior pole of the right kidney. No evidence of malignant recurrence. No evidence of lymphadenopathy or metastatic disease in the abdomen or pelvis. ? ?He reports that he voided about an hour ago and he drank water today.  He has minimal baseline urinary symptoms.  He is very pleased.  He thinks that starting the finasteride in addition to the Flomax has helped him significantly.  IPSS is below ? ?PSA trend:  ? Prostate Specific Ag, Serum  ?Latest Ref Rng 0.0 - 4.0 ng/mL  ?01/13/2017 4.8 (H)   ?07/29/2017 6.8 (H)   ?01/12/2018 5.3 (H)   ?10/08/2018  5.9 (H)   ?05/28/2019 10.3 (H)   ?07/06/2019 13.3 (H)   ?11/05/2019 5.7 (H) (started on finasteride 2 month prior)  ?04/12/2020 4.1 (H)   ?09/05/2020 4.0   ?09/03/2021 3.5   ?  ?(H) High ? ? IPSS   ? ? [place] Name 09/11/21 1200  ?  ?  ?  ? International Prostate Symptom Score  ? How often have you had the sensation of not emptying your bladder? Not at All    ? How often have you had to urinate less than every two hours? Less than 1 in 5 times    ? How often have you found you stopped and started again several times when you urinated? Not at All    ? How often have you found it difficult to postpone urination? Not at All    ? How often have you had a weak urinary stream? Less than 1 in 5 times    ? How often have you had to strain to start urination? Not at All    ? How many times did you typically get up at night to urinate? 1 Time    ? Total IPSS Score 3    ?  ? Quality of Life due to urinary symptoms  ? If you were to spend the rest of your life with your urinary condition just the way it is now how would you feel about that? Pleased    ? ?  ?  ? ?  ? ? ?  Score:  ?1-7 Mild ?8-19 Moderate ?20-35 Severe ? ? ? ?PMH: ?Past Medical History:  ?Diagnosis Date  ? Adenocarcinoma, renal cell ([place])   ? Arthritis   ? Collagen vascular disease ([place])   ? Diabetes mellitus without complication ([place])   ? Elevated PSA   ? Headache   ? Hematuria   ? Hyperlipidemia   ? Hypertension   ? Neuropathy   ? Obesity   ? PONV (postoperative nausea and vomiting)   ? Renal insufficiency   ? Right renal mass   ? ? ?Surgical History: ?Past Surgical History:  ?Procedure Laterality Date  ? APPENDECTOMY    ? CRYOABLATION  10/17/2017  ? IR RADIOLOGIST EVAL & MGMT  07/17/2017  ? IR RADIOLOGIST EVAL & MGMT  08/13/2017  ? IR RADIOLOGIST EVAL & MGMT  11/12/2017  ? IR RADIOLOGIST EVAL & MGMT  03/05/2018  ? RADIOLOGY WITH ANESTHESIA Right 10/17/2017  ? Procedure: CT WITH ANESTHESIA RENAL CRYOABLATION;  Surgeon: [name], MD;  Location: [facility];  Service:  Anesthesiology;  Laterality: Right;  ? ROBOTIC ASSITED PARTIAL NEPHRECTOMY Right 12/12/2015  ? Procedure: ROBOTIC ASSITED PARTIAL NEPHRECTOMY;  Surgeon: [name], MD;  Location: [facility];  Service: Urology;  Laterality: Right;  ? ? ?Home Medications:  ?Allergies as of 09/11/2021   ? ?   Reactions  ? Morphine Nausea And Vomiting  ? Morphine And Related Nausea And Vomiting  ? ?  ? ?  ?Medication List  ?  ? ?  ? Accurate as of Sep 11, 2021 12:59 PM. If you have any questions, ask your nurse or doctor.  ?  ?  ? ?  ? ?acetaminophen 325 MG tablet ?Commonly known as: TYLENOL ?Take 650 mg by mouth every 6 (six) hours as needed. Takes at bedtime and occasionally in am for back/hip pain ?  ?atorvastatin 20 MG tablet ?Commonly known as: LIPITOR ?TAKE 1 TABLET BY MOUTH  DAILY AT 6 PM. ?  ?carvedilol 6.25 MG tablet ?Commonly known as: COREG ?TAKE 1 TABLET BY MOUTH TWICE  DAILY ?  ?cholecalciferol 25 MCG (1000 UNIT) tablet ?Commonly known as: VITAMIN D3 ?Take 2,000 Units by mouth daily. ?  ?DULoxetine 60 MG capsule ?Commonly known as: CYMBALTA ?TAKE 1 CAPSULE BY MOUTH DAILY ?  ?finasteride 5 MG tablet ?Commonly known as: PROSCAR ?Take 1 tablet (5 mg total) by mouth daily. ?  ?folic acid 1 MG tablet ?Commonly known as: FOLVITE ?Take 1 tablet (1 mg total) by mouth daily. ?  ?gabapentin 600 MG tablet ?Commonly known as: NEURONTIN ?TAKE 1 TABLET BY MOUTH 3  TIMES DAILY ?  ?Januvia 100 MG tablet ?Generic drug: sitaGLIPtin ?TAKE 1 TABLET BY MOUTH DAILY ?  ?lidocaine 4 % cream ?Commonly known as: LMX ?Apply 1 application topically as needed. ?  ?metFORMIN 500 MG tablet ?Commonly known as: GLUCOPHAGE ?TAKE 2 TABLETS BY MOUTH TWICE  DAILY ?  ?methotrexate 2.5 MG tablet ?Commonly known as: RHEUMATREX ?Take 2.5 mg by mouth once a week. 6 tabs weekly ?  ?mometasone 0.1 % cream ?Commonly known as: ELOCON ?APPLY  CREAM TOPICALLY ONCE DAILY ?  ?SUPER B COMPLEX/FA/VIT C PO ?Take 1 tablet by mouth daily. Contains 338 mcg folic acid ?   ?tamsulosin 0.4 MG Caps capsule ?Commonly known as: FLOMAX ?TAKE 1 CAPSULE BY MOUTH  DAILY ?  ?vitamin C 500 MG tablet ?Commonly known as: ASCORBIC ACID ?Take 500 mg by mouth daily. ?  ? ?  ? ? ?Allergies:  ?Allergies  ?Allergen Reactions  ?  Morphine Nausea And Vomiting  ? Morphine And Related Nausea And Vomiting  ? ? ?Family History: ?Family History  ?Problem Relation Age of Onset  ? Bone cancer Mother   ? Cancer Mother   ? Cancer Father   ? Stroke Brother   ? Kidney disease Neg Hx   ? ? ?Social History:  reports that he has never smoked. He has never used smokeless tobacco. He reports that he does not drink alcohol and does not use drugs. ? ? ?Physical Exam: ?BP 116/76   Pulse 85   Ht 5' 8.25" (1.734 m)   Wt 201 lb (91.2 kg)   BMI 30.34 kg/m?   ?Constitutional:  Alert and oriented, No acute distress. ?HEENT: [place] AT, moist mucus membranes.  Trachea midline, no masses. ?Cardiovascular: No clubbing, cyanosis, or edema. ?Respiratory: Normal respiratory effort, no increased work of breathing. ?Skin: No rashes, bruises or suspicious lesions. ?Neurologic: Grossly intact, no focal deficits, moving all 4 extremities. ?Psychiatric: Normal mood and affect. ? ?Laboratory Data:f ?Lab Results  ?Component Value Date  ? CREATININE 0.77 09/03/2021  ? ?Lab Results  ?Component Value Date  ? HGBA1C 6.7 (H) 09/03/2021  ? ?Pertinent Imaging: ?CLINICAL DATA:  Right renal cell carcinoma, status post partial ?nephrectomy and cryoablation * Tracking Code: BO * ?  ?EXAM: ?CT ABDOMEN AND PELVIS WITH CONTRAST ?  ?TECHNIQUE: ?Multidetector CT imaging of the abdomen and pelvis was performed ?using the standard protocol following bolus administration of ?intravenous contrast. ?  ?RADIATION DOSE REDUCTION: This exam was performed according to the ?departmental dose-optimization program which includes automated ?exposure control, adjustment of the mA and/or kV according to ?patient size and/or use of iterative reconstruction technique. ?   ?CONTRAST:  169m OMNIPAQUE IOHEXOL 300 MG/ML  SOLN ?  ?COMPARISON:  09/05/2020 ?  ?FINDINGS: ?Lower chest: No acute abnormality. Three-vessel coronary artery ?calcifications. ?  ?Hepatobiliary: No solid liver abnormality is seen. N

## 2021-09-11 ENCOUNTER — Ambulatory Visit: Payer: Medicare Other | Admitting: Urology

## 2021-09-11 ENCOUNTER — Encounter: Payer: Self-pay | Admitting: Urology

## 2021-09-11 VITALS — BP 116/76 | HR 85 | Ht 68.25 in | Wt 201.0 lb

## 2021-09-11 DIAGNOSIS — Z85528 Personal history of other malignant neoplasm of kidney: Secondary | ICD-10-CM | POA: Diagnosis not present

## 2021-09-11 DIAGNOSIS — N138 Other obstructive and reflux uropathy: Secondary | ICD-10-CM | POA: Diagnosis not present

## 2021-09-11 DIAGNOSIS — R972 Elevated prostate specific antigen [PSA]: Secondary | ICD-10-CM | POA: Diagnosis not present

## 2021-09-11 DIAGNOSIS — N401 Enlarged prostate with lower urinary tract symptoms: Secondary | ICD-10-CM | POA: Diagnosis not present

## 2021-09-11 LAB — BLADDER SCAN AMB NON-IMAGING: Scan Result: 342

## 2021-09-26 ENCOUNTER — Telehealth: Payer: Self-pay

## 2021-09-26 NOTE — Progress Notes (Signed)
Chronic Care Management Pharmacy Assistant   Name: Jason Contreras  MRN: [id number] DOB: 11/15/1940    Reason for Encounter: Disease State-General    Recent office visits:  09/03/21 Jason Guarneri T, NP (Diabetes mellitus due to underlying condition with diabetic nephropathy, without long-term current use of insulin) Orders: BMP, Bayer DCA HB A1c,PSA; Medication changes: none  Recent consult visits:  09/11/21 Jason Espy, MD-Urology (BPH with obstruction/lower urinary tract symptoms) Orders: Bladder Scan (Post Void Residual) in office; Medication changes: none  Hospital visits:  None since last coordination call 08/07/21 with CCM RN  Medications: Outpatient Encounter Medications as of 09/26/2021  Medication Sig   acetaminophen (TYLENOL) 325 MG tablet Take 650 mg by mouth every 6 (six) hours as needed. Takes at bedtime and occasionally in am for back/hip pain   atorvastatin (LIPITOR) 20 MG tablet TAKE 1 TABLET BY MOUTH  DAILY AT 6 PM.   B Complex-C-Folic Acid (SUPER B COMPLEX/FA/VIT C PO) Take 1 tablet by mouth daily. Contains 482 mcg folic acid   carvedilol (COREG) 6.25 MG tablet TAKE 1 TABLET BY MOUTH TWICE  DAILY   cholecalciferol (VITAMIN D3) 25 MCG (1000 UNIT) tablet Take 2,000 Units by mouth daily.    DULoxetine (CYMBALTA) 60 MG capsule TAKE 1 CAPSULE BY MOUTH DAILY   finasteride (PROSCAR) 5 MG tablet Take 1 tablet (5 mg total) by mouth daily.   folic acid (FOLVITE) 1 MG tablet Take 1 tablet (1 mg total) by mouth daily.   gabapentin (NEURONTIN) 600 MG tablet TAKE 1 TABLET BY MOUTH 3  TIMES DAILY   JANUVIA 100 MG tablet TAKE 1 TABLET BY MOUTH DAILY   lidocaine (LMX) 4 % cream Apply 1 application topically as needed.   metFORMIN (GLUCOPHAGE) 500 MG tablet TAKE 2 TABLETS BY MOUTH TWICE  DAILY   methotrexate (RHEUMATREX) 2.5 MG tablet Take 2.5 mg by mouth once a week. 6 tabs weekly   mometasone (ELOCON) 0.1 % cream APPLY  CREAM TOPICALLY ONCE DAILY   tamsulosin (FLOMAX) 0.4 MG  CAPS capsule TAKE 1 CAPSULE BY MOUTH  DAILY   vitamin C (ASCORBIC ACID) 500 MG tablet Take 500 mg by mouth daily.   No facility-administered encounter medications on file as of 09/26/2021.   3 attempts to contacted Jason Contreras for General Review Call. Unable to reach patient, left messages for return call   Chart Review:  Have there been any documented new, changed, or discontinued medications since last visit? No (If yes, include name, dose, frequency, date) Has there been any documented recent hospitalizations or ED visits since last visit with Clinical Pharmacist? No Brief Summary (including medication and/or Diagnosis changes):   Adherence Review:  Does the Clinical Pharmacist Assistant have access to adherence rates? Yes Adherence rates for STAR metric medications (List medication(s)/day supply/ last 2 fill dates). Adherence rates for medications indicated for disease state being reviewed (List medication(s)/day supply/ last 2 fill dates). Does the patient have >5 day gap between last estimated fill dates for any of the above medications or other medication gaps? No Reason for medication gaps.    Next visit Type: telephone       Visit with:clinical pharmacist        Date:11/12/21        Time:1:45     Care Gaps: Colonoscopy-NA Diabetic Foot Exam-06/04/21 Ophthalmology-04/27/21 Dexa Scan - NA Annual Well Visit - NA Micro albumin-06/04/21 Hemoglobin A1c- 09/03/21  Star Rating Drugs: Metformin 500 mg-last fill 09/04/21 90 ds Atorvastatin 20  mg-last fill 06/28/21 90 ds  [place] Pharmacist Assistant [phone]

## 2021-10-16 ENCOUNTER — Ambulatory Visit (INDEPENDENT_AMBULATORY_CARE_PROVIDER_SITE_OTHER): Payer: Medicare Other

## 2021-10-16 ENCOUNTER — Telehealth: Payer: Medicare Other

## 2021-10-16 DIAGNOSIS — E0821 Diabetes mellitus due to underlying condition with diabetic nephropathy: Secondary | ICD-10-CM

## 2021-10-16 DIAGNOSIS — E1159 Type 2 diabetes mellitus with other circulatory complications: Secondary | ICD-10-CM

## 2021-10-16 DIAGNOSIS — I152 Hypertension secondary to endocrine disorders: Secondary | ICD-10-CM

## 2021-10-16 DIAGNOSIS — E1169 Type 2 diabetes mellitus with other specified complication: Secondary | ICD-10-CM

## 2021-10-16 NOTE — Chronic Care Management (AMB) (Signed)
Chronic Care Management   CCM RN Visit Note  10/16/2021 Name: Jason Contreras MRN: [id number] DOB: Mar 19, 1941  Subjective: Jason Contreras is a 81 y.o. year old male who is a primary care patient of [name], [name], NP. The care management team was consulted for assistance with disease management and care coordination needs.    Engaged with patient by telephone for follow up visit in response to provider referral for case management and/or care coordination services.   Consent to Services:  The patient was given information about Chronic Care Management services, agreed to services, and gave verbal consent prior to initiation of services.  Please see initial visit note for detailed documentation.   Patient agreed to services and verbal consent obtained.   Assessment: Review of patient past medical history, allergies, medications, health status, including review of consultants reports, laboratory and other test data, was performed as part of comprehensive evaluation and provision of chronic care management services.   SDOH (Social Determinants of Health) assessments and interventions performed:    CCM Care Plan  Allergies  Allergen Reactions   Morphine Nausea And Vomiting   Morphine And Related Nausea And Vomiting    Outpatient Encounter Medications as of 10/16/2021  Medication Sig   acetaminophen (TYLENOL) 325 MG tablet Take 650 mg by mouth Contreras 6 (six) hours as needed. Takes at bedtime and occasionally in am for back/hip pain   atorvastatin (LIPITOR) 20 MG tablet TAKE 1 TABLET BY MOUTH  DAILY AT 6 PM.   B Complex-C-Folic Acid (SUPER B COMPLEX/FA/VIT C PO) Take 1 tablet by mouth daily. Contains 940 mcg folic acid   carvedilol (COREG) 6.25 MG tablet TAKE 1 TABLET BY MOUTH TWICE  DAILY   cholecalciferol (VITAMIN D3) 25 MCG (1000 UNIT) tablet Take 2,000 Units by mouth daily.    DULoxetine (CYMBALTA) 60 MG capsule TAKE 1 CAPSULE BY MOUTH DAILY   finasteride (PROSCAR) 5 MG tablet Take 1  tablet (5 mg total) by mouth daily.   folic acid (FOLVITE) 1 MG tablet Take 1 tablet (1 mg total) by mouth daily.   gabapentin (NEURONTIN) 600 MG tablet TAKE 1 TABLET BY MOUTH 3  TIMES DAILY   JANUVIA 100 MG tablet TAKE 1 TABLET BY MOUTH DAILY   lidocaine (LMX) 4 % cream Apply 1 application topically as needed.   metFORMIN (GLUCOPHAGE) 500 MG tablet TAKE 2 TABLETS BY MOUTH TWICE  DAILY   methotrexate (RHEUMATREX) 2.5 MG tablet Take 2.5 mg by mouth once a week. 6 tabs weekly   mometasone (ELOCON) 0.1 % cream APPLY  CREAM TOPICALLY ONCE DAILY   tamsulosin (FLOMAX) 0.4 MG CAPS capsule TAKE 1 CAPSULE BY MOUTH  DAILY   vitamin C (ASCORBIC ACID) 500 MG tablet Take 500 mg by mouth daily.   No facility-administered encounter medications on file as of 10/16/2021.    Patient Active Problem List   Diagnosis Date Noted   Aortic atherosclerosis ([place]) 05/26/2020   Rheumatoid arthritis of multiple sites with negative rheumatoid factor ([place]) 12/08/2019   B12 deficiency 07/12/2019   Vitamin D deficiency 06/30/2019   Personal history of renal cancer 10/17/2017   Advanced care planning/counseling discussion 01/13/2017   Senile purpura ([place]) 09/26/2016   Elevated PSA 01/07/2015   BPH with obstruction/lower urinary tract symptoms 01/07/2015   DM due to underlying condition with diabetic nephropathy ([place])    Hypertension associated with diabetes ([place])    Hyperlipidemia associated with type 2 diabetes mellitus ([place])     Conditions to be  addressed/monitored:HTN, HLD, and DMII  Care Plan : RNCM; General Plan of Care (Adult) for Chronic Disease Management and Care Coordination Needs  Updates made by Vanita Ingles, RN since 10/16/2021 12:00 AM     Problem: RNCM: Development of Plan of Care for Chronic Disease Management (RA, HTN, HLD, DM)   Priority: High     Long-Range Goal: RNCM: Effective Management  of Plan of Care for Chronic Disease Management (RA, HTN, HLD, DM)   Start Date: 03/21/2021  Expected  End Date: 03/21/2022  Priority: High  Note:   Current Barriers:  Knowledge Deficits related to plan of care for management of HTN, HLD, DMII, and RA  Chronic Disease Management support and education needs related to HTN, HLD, DMII, and RA  RNCM Clinical Goal(s):  Patient will verbalize understanding of plan for management of HTN, HLD, DMII, and RA as evidenced by compliance with the plan of care, compliance with dietary restrictions, and working with the pcp and CCM team to optimize health and well being  demonstrate understanding of rationale for each prescribed medication as evidenced by compliance with medications and calling for refills before funning out of medications     attend all scheduled medical appointments: 12-04-2020 at 240 pm as evidenced by keeping appointments and calling for needed schedule changes         demonstrate improved and ongoing adherence to prescribed treatment plan for HTN, HLD, DMII, and RA as evidenced by taking blood sugars as directed, compliance with medications and dietary restrictions, and working with the CCM team to effectively manage health and well being  demonstrate a decrease in HTN, HLD, DMII, and RA exacerbations  as evidenced by calling for changes in condition and working with the CCM team and pcp to effectively manage chronic conditions  demonstrate ongoing self health care management ability for effective management of chronic conditions  as evidenced by working with the CCM team  through collaboration with Consulting civil engineer, provider, and care team.   Interventions: 1:1 collaboration with primary care provider regarding development and update of comprehensive plan of care as evidenced by provider attestation and co-signature Inter-disciplinary care team collaboration (see longitudinal plan of care) Evaluation of current treatment plan related to  self management and patient's adherence to plan as established by provider   Diabetes:  (Status: Goal on  track: Yes.) Long Term Goal   Lab Results  Component Value Date   HGBA1C 6.7 (H) 09/03/2021  06-04-2021: 8.5%, 02-27-2021 A1C 7.5 Assessed patient's understanding of A1c goal: <7%. 10-16-2021: The patient is aware of goal to meet Provided education to patient about basic DM disease process. 10-16-2021: Reviewed and provided education; Reviewed medications with patient and discussed importance of medication adherence. 06-05-2021: The patient is compliant with medications. Saw pcp yesterday and will start new medications. Will return next month for evaluation of A1C and medication changes.  08-07-2021: The patient could not tolerate the Trulicity. It made him stay in the bathroom constantly. He was changed to Januvia 100 mg daily. He states this is working a lot better but his blood sugars are not coming down like he thought they should. Will discuss with the pcp at upcoming visit.  10-16-2021: The patients wife verified that the patient is taking medications as directed. The patient was still sleeping at the time of the call.   Reviewed prescribed diet with patient heart healthy/ADA diet. 06-05-2021: Reviewed to stay away from starchy foods- potatoes, breads, pastas.  The patient does not like sweets  but does like breads. Education on monitoring dietary restrictions and eating foods like bread in moderation. The patient states he liked a lot of different foods at Christmas and knows he needs to watch his dietary intake better. Encouraged patient to watch out for foods high in carbs and sugars. 10-16-2021: Discussed heart healthy/ADA diet and to monitor for foods high in carbs and  sweets with patients wife. The patients wife states that he loves sweets and is afraid to check his blood sugars because he knows its high. Education provided on goal of A1C and goals of blood sugars. ; Counseled on importance of regular laboratory monitoring as prescribed. 10-16-2021: Has regular testing. ;        Discussed plans with  patient for ongoing care management follow up and provided patient with direct contact information for care management team;      Provided patient with written educational materials related to hypo and hyperglycemia and importance of correct treatment. 08-07-2021: The patient denies any lows , is having some elevations in readings. States the lowest he has seen is 135 but usually his blood sugars are ranging 150-170.   10-16-2021: Unable to assess the readings as the patients wife states that they have not been checking but will try to start back.   Reviewed scheduled/upcoming provider appointments including: 12-04-2021 at 240 pm;         Advised patient, providing education and rationale, to check cbg as directed and record. 03-21-2021: The patient has not been consistently checking blood sugars. Education on the benefits of checking blood sugars on a consistent basis. The patient states that he doesn't really know why he does not check. Review of goal of fasting blood sugars <130 and post prandial of <180. The patients last A1C trending down. The patient states he may have to add another medication to his regimen and will talk to the pcp about this in January.  06-05-2021: The patient usually checks blood sugars well after pcp visits and then stops. Education on the benefits of checking blood sugars more frequently. The patient states that if he took blood sugars 6 times a day he could not afford the strips for him and his wife. Education on checking per recommendations of pcp, and if he is feeling different. Review of goals of blood sugars: Fasting <130 and post prandial of <180. The patient states his blood sugars this am was 249. The patient states he got his machine from [facility] and buys strips there. Will collaborate with the pharm D for support in contacting the patient and see what options he has for a meter through his insurance that would provide him with strips also at no or reduced cost. Education provided to  the patient. 08-07-2021: The patient is checking his blood sugars more frequently and Contreras am except Saturday and Sunday. He states that he feels like his blood sugars should be better than what they have been but he says they are better tham the 200's he was seeing. Education and review of goals. <130 fasting and <180 post prandial. Education and support given.  10-16-2021: The patients wife states that they have not been taking and "Jason Contreras does not want to because he is eating sweets". The patients wife states they will try to do better at checking blood sugars. Education and support given     call provider for findings outside established parameters;       Review of patient status, including review of consultants reports, relevant laboratory and  other test results, and medications completed. 08-07-2021: Education that the patient likely will have new A1C drawn at upcoming visit and the goal or A1C <7.0. 10-15-2021: Education and support given    Advised patient to discuss medication options  with provider. 08-07-2021: the patient knows to call the pcp or discuss medication changes at upcoming appointments. He feels that he is doing well with the Januvia ;      Screening for signs and symptoms of depression related to chronic disease state;        Assessed social determinant of health barriers;         Hyperlipidemia:  (Status: Goal on Track (progressing): YES.) Long Term Goal  Lab Results  Component Value Date   CHOL 128 06/04/2021   HDL 44 06/04/2021   LDLCALC 60 06/04/2021   TRIG 140 06/04/2021   CHOLHDL 2.5 03/12/2018     Medication review performed; medication list updated in electronic medical record. 10-16-2021: The patient is compliant with medications. Takes Lipitor 20 mg daily as directed.  Provider established cholesterol goals reviewed. 10-16-2021: The patient is at goal. Praised for having levels at goal; Counseled on importance of regular laboratory monitoring as prescribed. 10-16-2021: The  patient has regular lab work done Provided HLD educational materials; Reviewed role and benefits of statin for ASCVD risk reduction; Discussed strategies to manage statin-induced myalgias; Reviewed importance of limiting foods high in cholesterol;  Hypertension: (Status: Goal on Track (progressing): YES.) Last practice recorded BP readings:  BP Readings from Last 3 Encounters:  09/11/21 116/76  09/03/21 122/71  07/02/21 122/72  Most recent eGFR/CrCl:  Lab Results  Component Value Date   EGFR 92 09/03/2021    No components found for: CRCL  Evaluation of current treatment plan related to hypertension self management and patient's adherence to plan as established by provider. 10-16-2021: The patient states that he has not new concerns with HTN or heart health. Will continue to monitor.    Provided education to patient re: stroke prevention, s/s of heart attack and stroke; Reviewed prescribed diet heart healthy/ADA diet . 10-16-2021: Reviewed with the patient.  Reviewed medications with patient and discussed importance of compliance. 10-16-2021: Reviewed with the patient  Discussed plans with patient for ongoing care management follow up and provided patient with direct contact information for care management team; Advised patient, providing education and rationale, to monitor blood pressure daily and record, calling PCP for findings outside established parameters;  Provided education on prescribed diet heart healthy/ADA;  Discussed complications of poorly controlled blood pressure such as heart disease, stroke, circulatory complications, vision complications, kidney impairment, sexual dysfunction;   Pain:  (Status: Condition stable. Not addressed this visit.) Long Term Goal  Pain assessment performed. 06-05-2021: The patient rates his RA pain at 0 today. Is thankful that the regimen he is on is working well. He is in his shop working and happy. 08-07-2021: The patient states he is having pain in  his left thumb and rates it at a 4 today on a scale of 0-10. The patient states he ask the RA provider about this at his visit on 07-24-2021 and he stated that he can tell it has some OA in it. He usually takes Tylenol for this but it makes him sleepy. Says sometimes if he is doing a lot with his hands it can get up to a 6/7. Denies any acute distress.  Medications reviewed. 08-07-2021: Is compliant with medications. Denies any concerns with medications Reviewed provider established plan for pain  management. 08-07-2021: Follows rheumatologist. Current plan of care is effective. The patient is compliant with the plan of care.  Discussed importance of adherence to all scheduled medical appointments. 08-07-2021: Sees specialist on a regular basis, last visit on 07-24-2021. Next appointment with the pcp on 09-03-2021 at 220 pm; Counseled on the importance of reporting any/all new or changed pain symptoms or management strategies to pain management provider; Advised patient to report to care team affect of pain on daily activities; Discussed use of relaxation techniques and/or diversional activities to assist with pain reduction (distraction, imagery, relaxation, massage, acupressure, TENS, heat, and cold application; Reviewed with patient prescribed pharmacological and nonpharmacological pain relief strategies. 08-07-2021: The patient states he needs a refill on his topical cream. Review and the patient has refills. Instructed the patient to call [facility] and ask for a refill. Patient was reluctant to do this but education provided that he would need to contact [place] for refill need as the pcp has placed an order for medication to be refilled. ; Advised patient to discuss uncontrolled pain, changes in level or intensity of pain  with provider;  Patient Goals/Self-Care Activities: Patient will self administer medications as prescribed as evidenced by self report/primary caregiver report  Patient will attend all  scheduled provider appointments as evidenced by clinician review of documented attendance to scheduled appointments and patient/caregiver report Patient will call pharmacy for medication refills as evidenced by patient report and review of pharmacy fill history as appropriate Patient will attend church or other social activities as evidenced by patient report Patient will continue to perform ADL's independently as evidenced by patient/caregiver report Patient will continue to perform IADL's independently as evidenced by patient/caregiver report Patient will call provider office for new concerns or questions as evidenced by review of documented incoming telephone call notes and patient report Patient will work with BSW to address care coordination needs and will continue to work with the clinical team to address health care and disease management related needs as evidenced by documented adherence to scheduled care management/care coordination appointments schedule appointment with eye doctor. 06-05-2021: The patient saw the eye doctor 3 weeks ago. No issues with diabetic neuropathy- got new glasses check blood sugar at prescribed times: twice daily, when you have symptoms of low or high blood sugar, before and after exercise, and as directed by the provider. The patient has not been consistently taking blood sugars, reviewed the benefits of taking blood sugars on a regular basis  check feet daily for cuts, sores or redness enter blood sugar readings and medication or insulin into daily log take the blood sugar log to all doctor visits trim toenails straight across drink 6 to 8 glasses of water each day eat fish at least once per week fill half of plate with vegetables limit fast food meals to no more than 1 per week manage portion size prepare main meal at home 3 to 5 days each week read food labels for fat, fiber, carbohydrates and portion size reduce red meat to 2 to 3 times a week wash and dry  feet carefully Contreras day wear comfortable, cotton socks wear comfortable, well-fitting shoes - check blood pressure weekly - choose a place to take my blood pressure (home, clinic or office, retail store) - write blood pressure results in a log or diary - learn about high blood pressure - keep a blood pressure log - take blood pressure log to all doctor appointments - call doctor for signs and symptoms of  high blood pressure - develop an action plan for high blood pressure - keep all doctor appointments - take medications for blood pressure exactly as prescribed - report new symptoms to your doctor - eat more whole grains, fruits and vegetables, lean meats and healthy fats - call for medicine refill 2 or 3 days before it runs out - take all medications exactly as prescribed - call doctor with any symptoms you believe are related to your medicine - call doctor when you experience any new symptoms - go to all doctor appointments as scheduled - adhere to prescribed diet: heart healthy/ADA       Plan:Telephone follow up appointment with care management team member scheduled for:  01-22-2022 at 145 pm  [place], MSN, [facility] Mobile: [phone]

## 2021-10-16 NOTE — Patient Instructions (Signed)
Visit Information  Thank you for taking time to visit with me today. Please Tyjae't hesitate to contact me if I can be of assistance to you before our next scheduled telephone appointment.  Following are the goals we discussed today:  Diabetes:  (Status: Goal on track: Yes.) Long Term Goal         Lab Results  Component Value Date    HGBA1C 6.7 (H) 09/03/2021  06-04-2021: 8.5%, 02-27-2021 A1C 7.5 Assessed patient's understanding of A1c goal: <7%. 10-16-2021: The patient is aware of goal to meet Provided education to patient about basic DM disease process. 10-16-2021: Reviewed and provided education; Reviewed medications with patient and discussed importance of medication adherence. 06-05-2021: The patient is compliant with medications. Saw pcp yesterday and will start new medications. Will return next month for evaluation of A1C and medication changes.  08-07-2021: The patient could not tolerate the Trulicity. It made him stay in the bathroom constantly. He was changed to Januvia 100 mg daily. He states this is working a lot better but his blood sugars are not coming down like he thought they should. Will discuss with the pcp at upcoming visit.  10-16-2021: The patients wife verified that the patient is taking medications as directed. The patient was still sleeping at the time of the call.   Reviewed prescribed diet with patient heart healthy/ADA diet. 06-05-2021: Reviewed to stay away from starchy foods- potatoes, breads, pastas.  The patient does not like sweets but does like breads. Education on monitoring dietary restrictions and eating foods like bread in moderation. The patient states he liked a lot of different foods at Christmas and knows he needs to watch his dietary intake better. Encouraged patient to watch out for foods high in carbs and sugars. 10-16-2021: Discussed heart healthy/ADA diet and to monitor for foods high in carbs and  sweets with patients wife. The patients wife states that he loves  sweets and is afraid to check his blood sugars because he knows its high. Education provided on goal of A1C and goals of blood sugars. ; Counseled on importance of regular laboratory monitoring as prescribed. 10-16-2021: Has regular testing. ;        Discussed plans with patient for ongoing care management follow up and provided patient with direct contact information for care management team;      Provided patient with written educational materials related to hypo and hyperglycemia and importance of correct treatment. 08-07-2021: The patient denies any lows , is having some elevations in readings. States the lowest he has seen is 135 but usually his blood sugars are ranging 150-170.   10-16-2021: Unable to assess the readings as the patients wife states that they have not been checking but will try to start back.   Reviewed scheduled/upcoming provider appointments including: 12-04-2021 at 240 pm;         Advised patient, providing education and rationale, to check cbg as directed and record. 03-21-2021: The patient has not been consistently checking blood sugars. Education on the benefits of checking blood sugars on a consistent basis. The patient states that he doesn't really know why he does not check. Review of goal of fasting blood sugars <130 and post prandial of <180. The patients last A1C trending down. The patient states he may have to add another medication to his regimen and will talk to the pcp about this in January.  06-05-2021: The patient usually checks blood sugars well after pcp visits and then stops. Education on the benefits  of checking blood sugars more frequently. The patient states that if he took blood sugars 6 times a day he could not afford the strips for him and his wife. Education on checking per recommendations of pcp, and if he is feeling different. Review of goals of blood sugars: Fasting <130 and post prandial of <180. The patient states his blood sugars this am was 249. The patient  states he got his machine from [facility] and buys strips there. Will collaborate with the pharm D for support in contacting the patient and see what options he has for a meter through his insurance that would provide him with strips also at no or reduced cost. Education provided to the patient. 08-07-2021: The patient is checking his blood sugars more frequently and every am except Saturday and Sunday. He states that he feels like his blood sugars should be better than what they have been but he says they are better tham the 200's he was seeing. Education and review of goals. <130 fasting and <180 post prandial. Education and support given.  10-16-2021: The patients wife states that they have not been taking and "Camila does not want to because he is eating sweets". The patients wife states they will try to do better at checking blood sugars. Education and support given     call provider for findings outside established parameters;       Review of patient status, including review of consultants reports, relevant laboratory and other test results, and medications completed. 08-07-2021: Education that the patient likely will have new A1C drawn at upcoming visit and the goal or A1C <7.0. 10-15-2021: Education and support given    Advised patient to discuss medication options  with provider. 08-07-2021: the patient knows to call the pcp or discuss medication changes at upcoming appointments. He feels that he is doing well with the Januvia ;      Screening for signs and symptoms of depression related to chronic disease state;        Assessed social determinant of health barriers;          Hyperlipidemia:  (Status: Goal on Track (progressing): YES.) Long Term Goal       Lab Results  Component Value Date    CHOL 128 06/04/2021    HDL 44 06/04/2021    LDLCALC 60 06/04/2021    TRIG 140 06/04/2021    CHOLHDL 2.5 03/12/2018      Medication review performed; medication list updated in electronic medical record. 10-16-2021: The  patient is compliant with medications. Takes Lipitor 20 mg daily as directed.  Provider established cholesterol goals reviewed. 10-16-2021: The patient is at goal. Praised for having levels at goal; Counseled on importance of regular laboratory monitoring as prescribed. 10-16-2021: The patient has regular lab work done Provided HLD educational materials; Reviewed role and benefits of statin for ASCVD risk reduction; Discussed strategies to manage statin-induced myalgias; Reviewed importance of limiting foods high in cholesterol;   Hypertension: (Status: Goal on Track (progressing): YES.) Last practice recorded BP readings:     BP Readings from Last 3 Encounters:  09/11/21 116/76  09/03/21 122/71  07/02/21 122/72  Most recent eGFR/CrCl:       Lab Results  Component Value Date    EGFR 92 09/03/2021    No components found for: CRCL   Evaluation of current treatment plan related to hypertension self management and patient's adherence to plan as established by provider. 10-16-2021: The patient states that he has not new concerns  with HTN or heart health. Will continue to monitor.    Provided education to patient re: stroke prevention, s/s of heart attack and stroke; Reviewed prescribed diet heart healthy/ADA diet . 10-16-2021: Reviewed with the patient.  Reviewed medications with patient and discussed importance of compliance. 10-16-2021: Reviewed with the patient  Discussed plans with patient for ongoing care management follow up and provided patient with direct contact information for care management team; Advised patient, providing education and rationale, to monitor blood pressure daily and record, calling PCP for findings outside established parameters;  Provided education on prescribed diet heart healthy/ADA;  Discussed complications of poorly controlled blood pressure such as heart disease, stroke, circulatory complications, vision complications, kidney impairment, sexual dysfunction;    Our next appointment is by telephone on 01-22-2022 at 145 pm  Please call the care guide team at [phone] if you need to cancel or reschedule your appointment.   If you are experiencing a Mental Health or [place] or need someone to talk to, please call the Suicide and Crisis Lifeline: 988 call the [place] National Suicide Prevention Lifeline: 8198174423 or TTY: 4084235960 TTY 8145110050) to talk to a trained counselor call 1-800-273-TALK (toll free, 24 hour hotline)   Patient verbalizes understanding of instructions and care plan provided today and agrees to view in [facility]. Active MyChart status and patient understanding of how to access instructions and care plan via MyChart confirmed with patient.     Noreene Larsson RN, MSN, [facility] Mobile: [phone]

## 2021-10-29 ENCOUNTER — Other Ambulatory Visit: Payer: Self-pay | Admitting: Nurse Practitioner

## 2021-11-02 DIAGNOSIS — E1159 Type 2 diabetes mellitus with other circulatory complications: Secondary | ICD-10-CM

## 2021-11-02 DIAGNOSIS — I10 Essential (primary) hypertension: Secondary | ICD-10-CM

## 2021-11-02 DIAGNOSIS — E785 Hyperlipidemia, unspecified: Secondary | ICD-10-CM | POA: Diagnosis not present

## 2021-11-02 DIAGNOSIS — Z7984 Long term (current) use of oral hypoglycemic drugs: Secondary | ICD-10-CM

## 2021-11-12 ENCOUNTER — Ambulatory Visit (INDEPENDENT_AMBULATORY_CARE_PROVIDER_SITE_OTHER): Payer: Medicare Other

## 2021-11-12 DIAGNOSIS — I152 Hypertension secondary to endocrine disorders: Secondary | ICD-10-CM

## 2021-11-12 DIAGNOSIS — E0821 Diabetes mellitus due to underlying condition with diabetic nephropathy: Secondary | ICD-10-CM

## 2021-11-12 DIAGNOSIS — E1169 Type 2 diabetes mellitus with other specified complication: Secondary | ICD-10-CM

## 2021-11-12 NOTE — Patient Instructions (Signed)
Visit Information   Goals Addressed   None    Patient Care Plan: RNCM: Diabetes Type 2 (Adult)  Completed 03/21/2021   Problem Identified: RNCM: Glycemic Management (Diabetes, Type 2) Resolved 03/21/2021  Priority: High     Long-Range Goal: RNCM: Glycemic Management Optimized Completed 03/21/2021  Start Date: 05/17/2020  Expected End Date: 11/02/2021  Recent Progress: On track  Priority: High  Note:   Objective: Duplicate goal, resolving Lab Results  Component Value Date   HGBA1C 7.8 (H) 11/27/2020     Lab Results  Component Value Date   CREATININE 0.88 11/27/2020   CREATININE 0.80 09/05/2020   CREATININE 0.82 06/02/2020     No results found for: EGFR Current Barriers:  Knowledge Deficits related to basic Diabetes pathophysiology and self care/management Knowledge Deficits related to medications used for management of diabetes Unable to independently manage DM Does not contact provider office for questions/concerns Case Manager Clinical Goal(s):  Collaboration with Venita Lick, NP regarding development and update of comprehensive plan of care as evidenced by provider attestation and co-signature Inter-disciplinary care team collaboration (see longitudinal plan of care) Over the next 120 days, patient will demonstrate improved adherence to prescribed treatment plan for diabetes self care/management as evidenced by:  daily monitoring and recording of CBG  adherence to ADA/ carb modified diet exercise 3/4 days/week adherence to prescribed medication regimen Interventions:  Provided education to patient about basic DM disease process.  07-12-2020: Review of heart healthy/ADA diet, medications compliance and overall DM health. The patient states he feels great.  Reviewed medications with patient and discussed importance of medication adherence. 07-12-2020: The patient ask about getting new script for his metformin 1070m tablets instead of 2- 500 mg tablets.  He wanted to  know why his wife had the 10030mtablet and he had the 500 mg x 2.  Education and support given. Education on not getting the 500 mg tablets mixed up with the 1000 mg tablets. The patient verbalized understanding. Will send in basket message to the pcp about changing script at new refill for 1000 mg tablets to be dispensed. 10-17-2020 After reconsidering, Patient reports he did not want to change Metformin tablet from 500 mg tab to 1000 mg tablet.  He continues to be careful and takes 2 tablets to equal 1000 mg. 01-16-2021: The patient states that he is taking his medications as directed. Denies any issues with compliance. Does not want to add additional medications for DM control, states he wants to work on dietary restrictions.  Discussed plans with patient for ongoing care management follow up and provided patient with direct contact information for care management team Provided patient with written educational materials related to hypo and hyperglycemia and importance of correct treatment.  01-16-2021: The patient states that he knows what to look for with changes in his blood sugars. Denies any lows or highs at this time.  Reviewed scheduled/upcoming provider appointments including: 02-27-2021 Advised patient, providing education and rationale, to check cbg BID and record, calling pcp for findings outside established parameters.  07-12-2020: The patient states that his blood sugars have been good except when he ate the dump cake earlier this week. States he keeps a consistent check on his blood sugars.  He is mindful of highs and lows. 10-17-2020: Patient checks blood glucose every morning with range of 140-170 lately.  Patient states he "eats what he wants to eat". He talked about his age and desiring to eat what he wants at this point in life.  Patient and his wife are going on a trip out of state to grandson's wedding this weekend.  Patient has all his medications.  Reviewed all medications. Need to encourage  heart healthy /ADA diet . 01-16-2021: The patients most recent A1C was 7.8. He admits he ate too many sweets at his grandsons wedding. He wants to get back on track. Fasting range has been 190 to 200. Reviewed the goal of fasting <130 and post prandial of <180. The patient verbalized understanding. Will continue to monitor.  Review of patient status, including review of consultants reports, relevant laboratory and other test results, and medications completed. - barriers to adherence to treatment plan identified - blood glucose readings reviewed - resources required to improve adherence to care identified - self-awareness of signs/symptoms of hypo or hyperglycemia encouraged - use of blood glucose monitoring log promoted Patient Goals/Self-Care Activities patient will:  - UNABLE to independently manage DM Checks blood sugars as prescribed and utilize hyper and hypoglycemia protocol as needed Adheres to prescribed ADA/carb modified Follow Up Plan: Telephone follow up appointment with care management team member scheduled for: 03-21-2021 at 10:30 am    Task: RNCM: Alleviate Barriers to Glycemic Management Completed 01/16/2021  Outcome: Positive  Note:   Care Management Activities:    - barriers to adherence to treatment plan identified - blood glucose readings reviewed - resources required to improve adherence to care identified - self-awareness of signs/symptoms of hypo or hyperglycemia encouraged - use of blood glucose monitoring log promoted        Patient Care Plan: RNCM: Hypertension (Adult)  Completed 03/21/2021   Problem Identified: RNCM: Hypertension (Hypertension) Resolved 03/21/2021  Priority: Medium     Long-Range Goal: RNCM: Hypertension Monitored Completed 03/21/2021  Start Date: 05/17/2020  Expected End Date: 11/02/2020  Recent Progress: On track  Priority: Medium  Note:   Objective: Resolving, duplicate goal  Last practice recorded BP readings:  BP Readings from  Last 3 Encounters:  11/27/20 132/82  09/07/20 (!) 150/84  09/01/20 132/80    This SmartLink has not been configured with any valid records.     Most recent eGFR/CrCl: No results found for: EGFR  No components found for: CRCL Current Barriers:  Knowledge Deficits related to basic understanding of hypertension pathophysiology and self care management Knowledge Deficits related to understanding of medications prescribed for management of hypertension Unable to independently HTN Unable to self administer medications as prescribed Does not contact provider office for questions/concerns Case Manager Clinical Goal(s):  patient will verbalize understanding of plan for hypertension management patient will attend all scheduled medical appointments: 02-27-2021 at 340 pm patient will demonstrate improved adherence to prescribed treatment plan for hypertension as evidenced by taking all medications as prescribed, monitoring and recording blood pressure as directed, adhering to low sodium/DASH diet  patient will demonstrate improved health management independence as evidenced by checking blood pressure as directed and notifying PCP if SBP>160 or DBP > 90, taking all medications as prescribe, and adhering to a low sodium diet as discussed. Interventions:  Collaboration with Venita Lick, NP regarding development and update of comprehensive plan of care as evidenced by provider attestation and co-signature Inter-disciplinary care team collaboration (see longitudinal plan of care) Evaluation of current treatment plan related to hypertension self management and patient's adherence to plan as established by provider. 01-16-2021: Denies any issues with HTN at this time. States his blood pressures have been good when taking at home. Did not have any current readings. States he and his  wife take it one time a week at home.  Provided education to patient re: stroke prevention, s/s of heart attack and stroke,  DASH diet, complications of uncontrolled blood pressure Reviewed medications with patient and discussed importance of compliance. 01-16-2021: Endorses compliance with medications.  Discussed plans with patient for ongoing care management follow up and provided patient with direct contact information for care management team Advised patient, providing education and rationale, to monitor blood pressure daily and record, calling PCP for findings outside established parameters. 07-12-2020: The patient states that he has been having headaches and when he has headaches sometimes he has blood pressures that are elevated but not bad. The patient states he has always had headaches but some days are worse than others. Reminded patient to discuss this with the pcp on next visit for recommendations. 10-17-2020: Patient reports chronic headaches for years.  He uses Tylenol PRN with good relief. 01-16-2021: Denies any new issues. Will continue to monitor. Reviewed scheduled/upcoming provider appointments including: 02-27-2021 at 340 pm Patient Goals/Self-Care Activities patient will:  - UNABLE to independently management HTN Calls provider office for new concerns, questions, or BP outside discussed parameters Checks BP and records as discussed Follows a low sodium diet/DASH diet - blood pressure trends reviewed - depression screen reviewed - home or ambulatory blood pressure monitoring encouraged Follow Up Plan: Telephone follow up appointment with care management team member scheduled for: 03-21-2021 at 10:30am    Task: RNCM: Identify and Monitor Blood Pressure Elevation Completed 01/16/2021  Outcome: Positive  Note:   Care Management Activities:    - blood pressure trends reviewed - depression screen reviewed - home or ambulatory blood pressure monitoring encouraged        Patient Care Plan: RNCM: Management of HLD  Completed 03/21/2021   Problem Identified: RNCM: Management of HLD Resolved 03/21/2021   Priority: Medium     Long-Range Goal: RNCM:HLD  Self-Management Plan Developed Completed 03/21/2021  Start Date: 05/17/2020  Expected End Date: 11/02/2021  Recent Progress: On track  Priority: Medium  Note:   Current Barriers: Resolving, duplicate goal  Poorly controlled hyperlipidemia, complicated by DM/HTN Current antihyperlipidemic regimen: Lipitor 20 mg daily  Most recent lipid panel:  Lab Results  Component Value Date   CHOL 104 11/27/2020   CHOL 138 06/02/2020   CHOL 127 02/28/2020   Lab Results  Component Value Date   HDL 40 11/27/2020   HDL 44 06/02/2020   HDL 44 02/28/2020   Lab Results  Component Value Date   LDLCALC 42 11/27/2020   LDLCALC 67 06/02/2020   LDLCALC 58 02/28/2020   Lab Results  Component Value Date   TRIG 126 11/27/2020   TRIG 155 (H) 06/02/2020   TRIG 142 02/28/2020   Lab Results  Component Value Date   CHOLHDL 2.5 03/12/2018   CHOLHDL 2.7 01/13/2017   CHOLHDL 3.6 11/21/2014   No results found for: LDLDIRECT   ASCVD risk enhancing conditions: age >2, DM, HTN Unable to independently manage HLD Does not contact provider office for questions/concerns  RN Care Manager Clinical Goal(s):   patient will work with Consulting civil engineer, providers, and care team towards execution of optimized self-health management plan patient will verbalize understanding of plan for HLD patient will work with [facility] and pcp to address needs related to HLD and effective management   patient will attend all scheduled medical appointments: 02-27-2021 at 340 pm  Interventions: Collaboration with Venita Lick, NP regarding development and update of comprehensive plan of care  as evidenced by provider attestation and co-signature Inter-disciplinary care team collaboration (see longitudinal plan of care) Medication review performed; medication list updated in electronic medical record.  Inter-disciplinary care team collaboration (see longitudinal plan of  care) Referred to pharmacy team for assistance with HLD medication management Evaluation of current treatment plan related to HLD and patient's adherence to plan as established by provider. 07-12-2020: The patient denies any new concerns with his HLD. States he is monitoring his dietary intake. Knows to call for changes in conditions or questions. 01-16-2021:  Patient continues to deny any concerns or questions regarding his HLD. Advised patient to call the office for changes in condition or questions concerning chronic conditions  Provided education to patient re: heart healthy/ADA diet and hidden sodium in food content  Discussed plans with patient for ongoing care management follow up and provided patient with direct contact information for care management team Reviewed scheduled/upcoming provider appointments including: 02-27-2021 at 340 pm  Patient Goals/Self-Care Activities: patient will:   - call for medicine refill 2 or 3 days before it runs out - call if I am sick and can't take my medicine - keep a list of all the medicines I take; vitamins and herbals too - learn to read medicine labels - drink 6 to 8 glasses of water each day - eat 3 to 5 servings of fruits and vegetables each day - eat 5 or 6 small meals each day - limit fast food meals to no more than 1 per week - manage portion size - prepare main meal at home 3 to 5 days each week - read food labels for fat, fiber, carbohydrates and portion size - be open to making changes - I can manage, know and watch for signs of a heart attack - if I have chest pain, call for help - learn about small changes that will make a big difference - learn my personal risk factors  - barriers to meeting goals identified - choices provided - collaboration with team encouraged - decision-making supported - health risks reviewed - questions answered - resources needed to meet goals identified - self-reflection promoted - self-reliance  encouraged  Follow Up Plan: Telephone follow up appointment with care management team member scheduled for:03-21-2021 at 10:30am      Task: RNCM: Mutually Develop and Royce Macadamia Achievement of Patient Goals Completed 01/16/2021  Outcome: Positive  Note:   Care Management Activities:    - barriers to meeting goals identified - choices provided - collaboration with team encouraged - decision-making supported - health risks reviewed - questions answered - resources needed to meet goals identified - self-reflection promoted - self-reliance encouraged        Patient Care Plan: RNCM: RA Management  Completed 03/21/2021   Problem Identified: RNCM: Pain (RA) Resolved 03/21/2021  Priority: Medium     Long-Range Goal: RNCM: Manage Pain- RA Completed 03/21/2021  Start Date: 05/17/2020  Expected End Date: 11/02/2021  Recent Progress: On track  Priority: Medium  Note:   Current Barriers: Duplicate goal, resolving Knowledge Deficits related to managing acute/chronic pain Non-adherence to scheduled provider appointments Non-adherence to prescribed medication regimen Difficulty obtaining medications Chronic Disease Management support and education needs related to chronic pain Lacks social connections Does not contact provider office for questions/concerns Nurse Case Manager Clinical Goal(s):  patient will verbalize understanding of plan for managing pain patient will attend all scheduled medical appointments: 02-27-2021 patient will demonstrate use of different relaxation  skills and/or diversional activities to assist with pain  reduction (distraction, imagery, relaxation, massage, acupressure, TENS, heat, and cold application patient will report pain at a level less than 3 to 4 on a 10-10 rating scale patient will use pharmacological and nonpharmacological pain relief strategies patient will verbalize acceptable level of pain relief and ability to engage in desired activities patient  will engage in desired activities without an increase in pain level Interventions:  Collaboration with Venita Lick, NP regarding development and update of comprehensive plan of care as evidenced by provider attestation and co-signature Inter-disciplinary care team collaboration (see longitudinal plan of care) - deep breathing, relaxation and mindfulness use promoted - medication-induced side effects managed - misuse of pain medication assessed. 01-16-2021:  Is compliant with medications  - motivation and barriers to change assessed and addressed - mutually acceptable comfort goal set - pain assessed. 01-16-2021: Rates pain level at a 3 today on a scale of 0-10. States it is in his left foot. States that it comes and goes and he randomly has it. Discussed other factors that may cause pain issue. States he will monitor for changes.  - pain treatment goals reviewed - premedication prior to activity encouraged Evaluation of current treatment plan related to RA and patient's adherence to plan as established by provider. 10-17-2020: Patient reports he is doing fairly well today overall with pain control.  Patient states he has chronic headaches for years.  He has mild headache this morning upon waking.  He uses Tylenol PRN with good relief. 01-16-2021: The patient is doing well with management of his RA. Is having left foot pain rates at a 3 today. States that this is something that comes and goes and he cannot correlate it to anything else.  He says it will come and then leave and be gone 2 to 3 weeks and comes back randomly. Education on monitoring for changes in food choices when he notices the pain and to discuss with pcp at upcoming visit in October. The patient verbalized understanding. Will continue to monitor.  Advised patient to call the office for new questions or concerns  Provided education to patient re: alternative pain control methods  Reviewed medications with patient and discussed  compliance. The patient was taken off of Leflunomide and started on Methotrexate. The patient states the Leflunomide was causing diarrhea. He has not had any issues with the Methotrexate. Will continue to monitor.  Discussed plans with patient for ongoing care management follow up and provided patient with direct contact information for care management team Allow patient to maintain a diary of pain ratings, timing, precipitating events, medications, treatments, and what works best to relieve pain,  Refer to support groups and self-help groups Educate patient about the use of pharmacological interventions for pain management- antianxiety, antidepressants, NSAIDS, opioid analgesics,  Explain the importance of lifestyle modifications to effective pain management  Patient Goals/Self Care Activities:  - healthy lifestyle promoted - medication side effects managed - multimodal pain management plan developed - pain assessed - response to pain management plan monitored - response to pharmacologic therapy monitored - support and encouragement provided - weight management encouraged Self-administers medications as prescribed Attends all scheduled provider appointments Calls pharmacy for medication refills Calls provider office for new concerns or questions Follow Up Plan: Telephone follow up appointment with care management team member scheduled for: 03-21-2021 at 10:30am      Task: RNCM: Facilitate Multimodal Pain Management Plan Completed 01/16/2021  Outcome: Positive  Note:   Care Management Activities:    - healthy lifestyle  promoted - medication side effects managed - multimodal pain management plan developed - pain assessed - response to pain management plan monitored - response to pharmacologic therapy monitored - support and encouragement provided - weight management encouraged        Patient Care Plan: CCM Pharmacy Care Plan     Problem Identified: DM, HTN, HLD, BPH,  Rheumatoid arthritis   Priority: High     Goal: Patient-Specific Goal      Goal: disease management   Start Date: 09/04/2020  Recent Progress: On track  Priority: High  Note:   Current Barriers:  Unable to maintain control of diabetes Suboptimal therapeutic regimen for diabetes  Pharmacist Clinical Goal(s):  Patient will maintain control of diabetes as evidenced by A1c and home readings  through collaboration with PharmD and provider.   Interventions: 1:1 collaboration with Venita Lick, NP regarding development and update of comprehensive plan of care as evidenced by provider attestation and co-signature Inter-disciplinary care team collaboration (see longitudinal plan of care) Comprehensive medication review performed; medication list updated in electronic medical record   .Hypertension (BP goal <130/80) BP Readings from Last 3 Encounters:  09/11/21 116/76  09/03/21 122/71  07/02/21 122/72   -Controlled -Current treatment: Carvedilol 6.25 mg bid Appropriate, Effective, Safe, Accessible -Medications previously tried: NA  -Current home readings: 130-140/70-80s -Current dietary habits: Foster't eat a lot of meats -Current exercise habits: no structured regimen, works in his building most days -Denies hypotensive/hypertensive symptoms -Educated on Daily salt intake goal < 2300 mg; Exercise goal of 150 minutes per week; Importance of home blood pressure monitoring; -Counseled to monitor BP at home 2-3 times weekly, document, and provide log at future appointments -Recommended to continue current medication   Hyperlipidemia/ CAD: (LDL goal < 70) The ASCVD Risk score (Arnett DK, et al., 2019) failed to calculate for the following reasons:   The valid total cholesterol range is 130 to 320 mg/dL Lab Results  Component Value Date   CHOL 128 06/04/2021   CHOL 104 11/27/2020   CHOL 138 06/02/2020   Lab Results  Component Value Date   HDL 44 06/04/2021   HDL 40  11/27/2020   HDL 44 06/02/2020   Lab Results  Component Value Date   LDLCALC 60 06/04/2021   LDLCALC 42 11/27/2020   LDLCALC 67 06/02/2020   Lab Results  Component Value Date   TRIG 140 06/04/2021   TRIG 126 11/27/2020   TRIG 155 (H) 06/02/2020   Lab Results  Component Value Date   CHOLHDL 2.5 03/12/2018   CHOLHDL 2.7 01/13/2017   CHOLHDL 3.6 11/21/2014  No results found for: "LDLDIRECT"  -Controlled -Current treatment: Atorvastatin 20 mg qd Appropriate, Effective, Safe, Accessible -Medications previously tried: NA -Educated on Benefits of statin for ASCVD risk reduction; Importance of limiting foods high in cholesterol; Exercise goal of 150 minutes per week; -Counseled on diet and exercise extensively Recommended to continue current medication   Diabetes (A1c goal <7%) Lab Results  Component Value Date   HGBA1C 6.7 (H) 09/03/2021   HGBA1C 8.5 (H) 06/04/2021   HGBA1C 7.5 (H) 02/27/2021   Lab Results  Component Value Date   MICROALBUR 10 06/04/2021   LDLCALC 60 06/04/2021   CREATININE 0.77 09/03/2021   Lab Results  Component Value Date   NA 140 09/03/2021   K 5.0 09/03/2021   CREATININE 0.77 09/03/2021   EGFR 92 09/03/2021   GFRNONAA 85 06/02/2020   GLUCOSE 194 (H) 09/03/2021   Lab Results  Component  Value Date   WBC 5.8 06/04/2021   HGB 14.6 06/04/2021   HCT 43.8 06/04/2021   MCV 98 (H) 06/04/2021   PLT 278 06/04/2021  -Controlled    -Current medications: Metformin 1000 mg bid Appropriate, Effective, Safe, Accessible Sitagliptin 130m Appropriate, Effective, Safe, Accessible -Medications previously tried: NA -Current home glucose readings have not checked since 4/13 when spouse had eye surgery. Will resume tomorrow.  fasting glucose:  July 2023: Doesn't test, his meter broke. Counseled on importance of checking sugars a couple times/week post prandial glucose:  -Denies hypoglycemic/hyperglycemic symptoms -Current meal patterns:  -wife reports  making drop cookies while daughter was home  -Educated on A1c and blood sugar goals; Complications of diabetes including kidney damage, retinal damage, and cardiovascular disease; Benefits of routine self-monitoring of blood sugar; Patient schedule for CT with contrast tomorrow.  -Counseled to check feet daily and get yearly eye exams -Recommended to continue current medication   Rheumatoid arthritis (Goal: improve functioning , prevent AE's) -Controlled Follows with rheumatology -Current treatment  Methotrexate 15 mg q week Appropriate, Effective, Safe, Accessible Folic acid 1 mg qd Appropriate, Effective, Safe, Accessible -Medications previously tried: leflunomide - diarrhea -Counseled on diet and exercise extensively Recommended patient continue B complex with folic acid. Dosages of 5 mg folate daily have been used in studies. Advised patient to disclose to rheumatologist for completeness.  BPH/ H/o renal cancer partioal neprectomy (Goal: reduce sympotms) -Controlled follows with Urology   PSA trend:    Prostate Specific Ag, Serum  Latest Ref Rng 0.0 - 4.0 ng/mL  01/13/2017 4.8 (H)   07/29/2017 6.8 (H)   01/12/2018 5.3 (H)   10/08/2018 5.9 (H)   05/28/2019 10.3 (H)   07/06/2019 13.3 (H)   11/05/2019 5.7 (H) (started on finasteride 2 month prior)  04/12/2020 4.1 (H)   09/05/2020 4.0   09/03/2021 3.5     (H) High     IPSS       Row Name 09/11/21 1200                   International Prostate Symptom Score    How often have you had the sensation of not emptying your bladder? Not at All        How often have you had to urinate less than every two hours? Less than 1 in 5 times        How often have you found you stopped and started again several times when you urinated? Not at All        How often have you found it difficult to postpone urination? Not at All        How often have you had a weak urinary stream? Less than 1 in 5 times        How often have you had to strain to start  urination? Not at All        How many times did you typically get up at night to urinate? 1 Time        Total IPSS Score 3               Quality of Life due to urinary symptoms    If you were to spend the rest of your life with your urinary condition just the way it is now how would you feel about that? Pleased      -Current treatment  Finasteride 5 mg qd Appropriate, Effective, Safe, Accessible tamsulosin 0.4 mg qd Appropriate, Effective, Safe, Accessible -  Medications previously tried: NA -Recommended to continue current medication   Patient Goals/Self-Care Activities Patient will:  - take medications as prescribed check glucose 3- 5 times weekly, document, and provide at future appointments check blood pressure 2-3 times weekly, document, and provide at future appointments target a minimum of 150 minutes of moderate intensity exercise weekly  Follow Up Plan: Telephone follow up appointment with care management team member scheduled for: Jan 2024  [place] Constable, Pharm.D. - [phone]           Problem Identified: CHL AMB "PATIENT-SPECIFIC PROBLEM"      Patient Care Plan: RNCM; General Plan of Care (Adult) for Chronic Disease Management and Care Coordination Needs     Problem Identified: RNCM: Development of Plan of Care for Chronic Disease Management (RA, HTN, HLD, DM)   Priority: High     Long-Range Goal: RNCM: Effective Management  of Plan of Care for Chronic Disease Management (RA, HTN, HLD, DM)   Start Date: 03/21/2021  Expected End Date: 03/21/2022  Priority: High  Note:   Current Barriers:  Knowledge Deficits related to plan of care for management of HTN, HLD, DMII, and RA  Chronic Disease Management support and education needs related to HTN, HLD, DMII, and RA  RNCM Clinical Goal(s):  Patient will verbalize understanding of plan for management of HTN, HLD, DMII, and RA as evidenced by compliance with the plan of care, compliance with dietary  restrictions, and working with the pcp and CCM team to optimize health and well being  demonstrate understanding of rationale for each prescribed medication as evidenced by compliance with medications and calling for refills before funning out of medications     attend all scheduled medical appointments: 12-04-2020 at 240 pm as evidenced by keeping appointments and calling for needed schedule changes         demonstrate improved and ongoing adherence to prescribed treatment plan for HTN, HLD, DMII, and RA as evidenced by taking blood sugars as directed, compliance with medications and dietary restrictions, and working with the CCM team to effectively manage health and well being  demonstrate a decrease in HTN, HLD, DMII, and RA exacerbations  as evidenced by calling for changes in condition and working with the CCM team and pcp to effectively manage chronic conditions  demonstrate ongoing self health care management ability for effective management of chronic conditions  as evidenced by working with the CCM team  through collaboration with Consulting civil engineer, provider, and care team.   Interventions: 1:1 collaboration with primary care provider regarding development and update of comprehensive plan of care as evidenced by provider attestation and co-signature Inter-disciplinary care team collaboration (see longitudinal plan of care) Evaluation of current treatment plan related to  self management and patient's adherence to plan as established by provider   Diabetes:  (Status: Goal on track: NO.) Long Term Goal   Lab Results  Component Value Date   HGBA1C 6.7 (H) 09/03/2021  06-04-2021: 8.5%, 02-27-2021 A1C 7.5 Assessed patient's understanding of A1c goal: <7%. 10-16-2021: The patient is aware of goal to meet Provided education to patient about basic DM disease process. 10-16-2021: Reviewed and provided education; Reviewed medications with patient and discussed importance of medication adherence.  06-05-2021: The patient is compliant with medications. Saw pcp yesterday and will start new medications. Will return next month for evaluation of A1C and medication changes.  08-07-2021: The patient could not tolerate the Trulicity. It made him stay in the bathroom constantly. He was changed to [employer]  100 mg daily. He states this is working a lot better but his blood sugars are not coming down like he thought they should. Will discuss with the pcp at upcoming visit.  10-16-2021: The patients wife verified that the patient is taking medications as directed. The patient was still sleeping at the time of the call.   Reviewed prescribed diet with patient heart healthy/ADA diet. 06-05-2021: Reviewed to stay away from starchy foods- potatoes, breads, pastas.  The patient does not like sweets but does like breads. Education on monitoring dietary restrictions and eating foods like bread in moderation. The patient states he liked a lot of different foods at Christmas and knows he needs to watch his dietary intake better. Encouraged patient to watch out for foods high in carbs and sugars. 10-16-2021: Discussed heart healthy/ADA diet and to monitor for foods high in carbs and  sweets with patients wife. The patients wife states that he loves sweets and is afraid to check his blood sugars because he knows its high. Education provided on goal of A1C and goals of blood sugars. ; Counseled on importance of regular laboratory monitoring as prescribed. 10-16-2021: Has regular testing. ;        Discussed plans with patient for ongoing care management follow up and provided patient with direct contact information for care management team;      Provided patient with written educational materials related to hypo and hyperglycemia and importance of correct treatment. 08-07-2021: The patient denies any lows , is having some elevations in readings. States the lowest he has seen is 135 but usually his blood sugars are ranging 150-170.    10-16-2021: Unable to assess the readings as the patients wife states that they have not been checking but will try to start back.   Reviewed scheduled/upcoming provider appointments including: 12-04-2021 at 240 pm;         Advised patient, providing education and rationale, to check cbg as directed and record. 03-21-2021: The patient has not been consistently checking blood sugars. Education on the benefits of checking blood sugars on a consistent basis. The patient states that he doesn't really know why he does not check. Review of goal of fasting blood sugars <130 and post prandial of <180. The patients last A1C trending down. The patient states he may have to add another medication to his regimen and will talk to the pcp about this in January.  06-05-2021: The patient usually checks blood sugars well after pcp visits and then stops. Education on the benefits of checking blood sugars more frequently. The patient states that if he took blood sugars 6 times a day he could not afford the strips for him and his wife. Education on checking per recommendations of pcp, and if he is feeling different. Review of goals of blood sugars: Fasting <130 and post prandial of <180. The patient states his blood sugars this am was 249. The patient states he got his machine from [facility] and buys strips there. Will collaborate with the pharm D for support in contacting the patient and see what options he has for a meter through his insurance that would provide him with strips also at no or reduced cost. Education provided to the patient. 08-07-2021: The patient is checking his blood sugars more frequently and every am except Saturday and Sunday. He states that he feels like his blood sugars should be better than what they have been but he says they are better tham the 200's he was seeing. Education  and review of goals. <130 fasting and <180 post prandial. Education and support given.  10-16-2021: The patients wife states that they have not  been taking and "Jason Contreras does not want to because he is eating sweets". The patients wife states they will try to do better at checking blood sugars. Education and support given     call provider for findings outside established parameters;       Review of patient status, including review of consultants reports, relevant laboratory and other test results, and medications completed. 08-07-2021: Education that the patient likely will have new A1C drawn at upcoming visit and the goal or A1C <7.0. 10-15-2021: Education and support given    Advised patient to discuss medication options  with provider. 08-07-2021: the patient knows to call the pcp or discuss medication changes at upcoming appointments. He feels that he is doing well with the Januvia ;      Screening for signs and symptoms of depression related to chronic disease state;        Assessed social determinant of health barriers;         Hyperlipidemia:  (Status: Goal on Track (progressing): YES.) Long Term Goal  Lab Results  Component Value Date   CHOL 128 06/04/2021   HDL 44 06/04/2021   LDLCALC 60 06/04/2021   TRIG 140 06/04/2021   CHOLHDL 2.5 03/12/2018     Medication review performed; medication list updated in electronic medical record. 10-16-2021: The patient is compliant with medications. Takes Lipitor 20 mg daily as directed.  Provider established cholesterol goals reviewed. 10-16-2021: The patient is at goal. Praised for having levels at goal; Counseled on importance of regular laboratory monitoring as prescribed. 10-16-2021: The patient has regular lab work done Provided HLD educational materials; Reviewed role and benefits of statin for ASCVD risk reduction; Discussed strategies to manage statin-induced myalgias; Reviewed importance of limiting foods high in cholesterol;  Hypertension: (Status: Goal on Track (progressing): YES.) Last practice recorded BP readings:  BP Readings from Last 3 Encounters:  09/11/21 116/76  09/03/21 122/71   07/02/21 122/72  Most recent eGFR/CrCl:  Lab Results  Component Value Date   EGFR 92 09/03/2021    No components found for: CRCL  Evaluation of current treatment plan related to hypertension self management and patient's adherence to plan as established by provider. 10-16-2021: The patient states that he has not new concerns with HTN or heart health. Will continue to monitor.    Provided education to patient re: stroke prevention, s/s of heart attack and stroke; Reviewed prescribed diet heart healthy/ADA diet . 10-16-2021: Reviewed with the patient.  Reviewed medications with patient and discussed importance of compliance. 10-16-2021: Reviewed with the patient  Discussed plans with patient for ongoing care management follow up and provided patient with direct contact information for care management team; Advised patient, providing education and rationale, to monitor blood pressure daily and record, calling PCP for findings outside established parameters;  Provided education on prescribed diet heart healthy/ADA;  Discussed complications of poorly controlled blood pressure such as heart disease, stroke, circulatory complications, vision complications, kidney impairment, sexual dysfunction;   Pain:  (Status: Condition stable. Not addressed this visit.) Long Term Goal  Pain assessment performed. 06-05-2021: The patient rates his RA pain at 0 today. Is thankful that the regimen he is on is working well. He is in his shop working and happy. 08-07-2021: The patient states he is having pain in his left thumb and rates it at a 4 today on  a scale of 0-10. The patient states he ask the RA provider about this at his visit on 07-24-2021 and he stated that he can tell it has some OA in it. He usually takes Tylenol for this but it makes him sleepy. Says sometimes if he is doing a lot with his hands it can get up to a 6/7. Denies any acute distress.  Medications reviewed. 08-07-2021: Is compliant with medications. Denies  any concerns with medications Reviewed provider established plan for pain management. 08-07-2021: Follows rheumatologist. Current plan of care is effective. The patient is compliant with the plan of care.  Discussed importance of adherence to all scheduled medical appointments. 08-07-2021: Sees specialist on a regular basis, last visit on 07-24-2021. Next appointment with the pcp on 09-03-2021 at 220 pm; Counseled on the importance of reporting any/all new or changed pain symptoms or management strategies to pain management provider; Advised patient to report to care team affect of pain on daily activities; Discussed use of relaxation techniques and/or diversional activities to assist with pain reduction (distraction, imagery, relaxation, massage, acupressure, TENS, heat, and cold application; Reviewed with patient prescribed pharmacological and nonpharmacological pain relief strategies. 08-07-2021: The patient states he needs a refill on his topical cream. Review and the patient has refills. Instructed the patient to call [facility] and ask for a refill. Patient was reluctant to do this but education provided that he would need to contact [place] for refill need as the pcp has placed an order for medication to be refilled. ; Advised patient to discuss uncontrolled pain, changes in level or intensity of pain  with provider;  Patient Goals/Self-Care Activities: Patient will self administer medications as prescribed as evidenced by self report/primary caregiver report  Patient will attend all scheduled provider appointments as evidenced by clinician review of documented attendance to scheduled appointments and patient/caregiver report Patient will call pharmacy for medication refills as evidenced by patient report and review of pharmacy fill history as appropriate Patient will attend church or other social activities as evidenced by patient report Patient will continue to perform ADL's independently as evidenced  by patient/caregiver report Patient will continue to perform IADL's independently as evidenced by patient/caregiver report Patient will call provider office for new concerns or questions as evidenced by review of documented incoming telephone call notes and patient report Patient will work with BSW to address care coordination needs and will continue to work with the clinical team to address health care and disease management related needs as evidenced by documented adherence to scheduled care management/care coordination appointments schedule appointment with eye doctor. 06-05-2021: The patient saw the eye doctor 3 weeks ago. No issues with diabetic neuropathy- got new glasses check blood sugar at prescribed times: twice daily, when you have symptoms of low or high blood sugar, before and after exercise, and as directed by the provider. The patient has not been consistently taking blood sugars, reviewed the benefits of taking blood sugars on a regular basis  check feet daily for cuts, sores or redness enter blood sugar readings and medication or insulin into daily log take the blood sugar log to all doctor visits trim toenails straight across drink 6 to 8 glasses of water each day eat fish at least once per week fill half of plate with vegetables limit fast food meals to no more than 1 per week manage portion size prepare main meal at home 3 to 5 days each week read food labels for fat, fiber, carbohydrates and portion size  reduce red meat to 2 to 3 times a week wash and dry feet carefully every day wear comfortable, cotton socks wear comfortable, well-fitting shoes - check blood pressure weekly - choose a place to take my blood pressure (home, clinic or office, retail store) - write blood pressure results in a log or diary - learn about high blood pressure - keep a blood pressure log - take blood pressure log to all doctor appointments - call doctor for signs and symptoms of high blood  pressure - develop an action plan for high blood pressure - keep all doctor appointments - take medications for blood pressure exactly as prescribed - report new symptoms to your doctor - eat more whole grains, fruits and vegetables, lean meats and healthy fats - call for medicine refill 2 or 3 days before it runs out - take all medications exactly as prescribed - call doctor with any symptoms you believe are related to your medicine - call doctor when you experience any new symptoms - go to all doctor appointments as scheduled - adhere to prescribed diet: heart healthy/ADA       Mr. Sackmann was given information about Chronic Care Management services today including:  CCM service includes personalized support from designated clinical staff supervised by his physician, including individualized plan of care and coordination with other care providers 24/7 contact phone numbers for assistance for urgent and routine care needs. Standard insurance, coinsurance, copays and deductibles apply for chronic care management only during months in which we provide at least 20 minutes of these services. Most insurances cover these services at 100%, however patients may be responsible for any copay, coinsurance and/or deductible if applicable. This service may help you avoid the need for more expensive face-to-face services. Only one practitioner may furnish and bill the service in a calendar month. The patient may stop CCM services at any time (effective at the end of the month) by phone call to the office staff.  Patient agreed to services and verbal consent obtained.   The patient verbalized understanding of instructions, educational materials, and care plan provided today and DECLINED offer to receive copy of patient instructions, educational materials, and care plan.  The pharmacy team will reach out to the patient again over the next 60 days.   Lane Hacker, [place]

## 2021-11-12 NOTE — Progress Notes (Signed)
Chronic Care Management Pharmacy Note  11/12/2021 Name:  Jason Contreras MRN:  [id number] DOB:  12/20/1940  Summary: Pleasant 81 year old male presents for f/u CCM visit. 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  Recommendations/Changes made from today's visit: -Patient's glucose meter broke. He states, "I'm afraid to get a new one because I [place] know if the readings are going to be good." Listened to his fears and counseled him to try to get one before f/u with PCP next month. Practiced empathetic listening -Will have CCM team check in monthly   Subjective: Jason Contreras is an 81 y.o. year old male who is a primary patient of [name], [name], NP.  The CCM team was consulted for assistance with disease management and care coordination needs.    Engaged with patient by telephone for follow up visit in response to provider referral for pharmacy case management and/or care coordination services.   Consent to Services:  The patient was given the following information about Chronic Care Management services today, agreed to services, and gave verbal consent: 1. CCM service includes personalized support from designated clinical staff supervised by the primary care provider, including individualized plan of care and coordination with other care providers 2. 24/7 contact phone numbers for assistance for urgent and routine care needs. 3. Service will only be billed when office clinical staff spend 20 minutes or more in a month to coordinate care. 4. Only one practitioner may furnish and bill the service in a calendar month. 5.The patient may stop CCM services at any time (effective at the end of the month) by phone call to the office staff. 6. The patient will be responsible for cost sharing (co-pay) of up to 20% of the  service fee (after annual deductible is met). Patient agreed to services and consent obtained.  Patient Care Team: Venita Lick, NP as PCP - General (Nurse Practitioner) [name], MD as Consulting Physician (Urology) [name], MD as Consulting Physician (Interventional Radiology) Vanita Ingles, RN as Case Manager (General Practice) Vladimir Faster, [facility] (Inactive) as Pharmacist (Pharmacist) Lane Hacker, [facility] (Pharmacist)  Recent office visits:  09/03/21 Venita Lick, NP (Diabetes mellitus due to underlying condition with diabetic nephropathy, without long-term current use of insulin) Orders: BMP, Bayer DCA HB A1c,PSA; Medication changes: none   Recent consult visits:  09/11/21 [name], MD-Urology (BPH with obstruction/lower urinary tract symptoms) Orders: Bladder Scan (Post Void Residual) in office; Medication changes: none   Hospital visits:  None since last coordination call 08/07/21 with CCM RN   Objective:  Lab Results  Component Value Date   CREATININE 0.77 09/03/2021   BUN 15 09/03/2021   EGFR 92 09/03/2021   GFRNONAA 85 06/02/2020   GFRAA 99 06/02/2020   NA 140 09/03/2021   K 5.0 09/03/2021   CALCIUM 9.4 09/03/2021   CO2 25 09/03/2021   GLUCOSE 194 (H) 09/03/2021    Lab Results  Component Value Date/Time   HGBA1C 6.7 (H) 09/03/2021 02:03 PM   HGBA1C 8.5 (H) 06/04/2021 11:21 AM   HGBA1C 6.3 12/27/2015 12:00 AM   MICROALBUR 10 06/04/2021 11:21 AM   MICROALBUR 10 06/14/2020 11:04 AM    Last diabetic Eye exam:  Lab Results  Component Value Date/Time   HMDIABEYEEXA No Retinopathy 04/27/2021 12:00  AM    Last diabetic Foot exam: No results found for: "HMDIABFOOTEX"   Lab Results  Component Value Date   CHOL 128 06/04/2021   HDL 44 06/04/2021   LDLCALC 60 06/04/2021   TRIG 140 06/04/2021   CHOLHDL 2.5 03/12/2018       Latest Ref Rng & Units 06/04/2021   11:23 AM 06/02/2020    2:54 PM 05/28/2019   11:47 AM  Hepatic Function   Total Protein 6.0 - 8.5 g/dL 6.5  6.1  6.5   Albumin 3.7 - 4.7 g/dL 4.3  4.1  4.0   AST 0 - 40 IU/L '13  21  13   ' ALT 0 - 44 IU/L '13  21  8   ' Alk Phosphatase 44 - 121 IU/L 87  68  99   Total Bilirubin 0.0 - 1.2 mg/dL 0.7  0.5  0.5     Lab Results  Component Value Date/Time   TSH 4.200 06/04/2021 11:23 AM   TSH 1.470 06/02/2020 02:54 PM       Latest Ref Rng & Units 06/04/2021   11:23 AM 06/02/2020    2:54 PM 07/12/2019    3:21 PM  CBC  WBC 3.4 - 10.8 x10E3/uL 5.8  6.4  7.3   Hemoglobin 13.0 - 17.7 g/dL 14.6  13.8  13.0   Hematocrit 37.5 - 51.0 % 43.8  42.1  39.1   Platelets 150 - 450 x10E3/uL 278  243  287     Lab Results  Component Value Date/Time   VD25OH 32.4 06/04/2021 11:23 AM   VD25OH 32.6 09/01/2020 03:25 PM    Clinical ASCVD: Yes  The ASCVD Risk score (Arnett DK, et al., 2019) failed to calculate for the following reasons:   The valid total cholesterol range is 130 to 320 mg/dL       09/03/2021    2:04 PM 07/02/2021    1:21 PM 06/04/2021   11:16 AM  Depression screen PHQ 2/9  Decreased Interest 0 0 0  Down, Depressed, Hopeless 0 0 0  PHQ - 2 Score 0 0 0  Altered sleeping 0 0 0  Tired, decreased energy 0 0 0  Change in appetite 0 0 0  Feeling bad or failure about yourself  0 0 0  Trouble concentrating 0 0 0  Moving slowly or fidgety/restless 0 0 0  Suicidal thoughts 0 0 0  PHQ-9 Score 0 0 0  Difficult doing work/chores Not difficult at all       Other: (CHADS2VASc if Afib, MMRC or CAT for COPD, ACT, DEXA)  Social History   Tobacco Use  Smoking Status Never  Smokeless Tobacco Never   BP Readings from Last 3 Encounters:  09/11/21 116/76  09/03/21 122/71  07/02/21 122/72   Pulse Readings from Last 3 Encounters:  09/11/21 85  09/03/21 74  07/02/21 76   Wt Readings from Last 3 Encounters:  09/11/21 201 lb (91.2 kg)  09/03/21 201 lb 6.4 oz (91.4 kg)  07/02/21 199 lb 6.4 oz (90.4 kg)   BMI Readings from Last 3 Encounters:  09/11/21 30.34 kg/m   09/03/21 30.40 kg/m  07/02/21 30.10 kg/m    Assessment/Interventions: Review of patient past medical history, allergies, medications, health status, including review of consultants reports, laboratory and other test data, was performed as part of comprehensive evaluation and provision of chronic care management services.   SDOH:  (Social Determinants of Health) assessments and interventions performed: Yes SDOH Interventions    Flowsheet Row  Most Recent Value  SDOH Interventions   Financial Strain Interventions Intervention Not Indicated  Transportation Interventions Intervention Not Indicated      SDOH Screenings   Alcohol Screen: Low Risk  (01/16/2021)   Alcohol Screen    Last Alcohol Screening Score (AUDIT): 0  Depression (PHQ2-9): Low Risk  (09/03/2021)   Depression (PHQ2-9)    PHQ-2 Score: 0  Financial Resource Strain: Low Risk  (11/12/2021)   Overall Financial Resource Strain (CARDIA)    Difficulty of Paying Living Expenses: Not hard at all  Food Insecurity: No Food Insecurity (02/02/2021)   Hunger Vital Sign    Worried About Running Out of Food in the Last Year: Never true    Ran Out of Food in the Last Year: Never true  Housing: Low Risk  (01/16/2021)   Housing    Last Housing Risk Score: 0  Physical Activity: Inactive (02/02/2021)   Exercise Vital Sign    Days of Exercise per Week: 0 days    Minutes of Exercise per Session: 0 min  Social Connections: Moderately Integrated (01/16/2021)   Social Connection and Isolation Panel [NHANES]    Frequency of Communication with Friends and Family: More than three times a week    Frequency of Social Gatherings with Friends and Family: More than three times a week    Attends Religious Services: More than 4 times per year    Active Member of [employer] or Organizations: No    Attends Archivist Meetings: Never    Marital Status: Married  Stress: No Stress Concern Present (02/02/2021)   [place]    Feeling of Stress : Not at all  Tobacco Use: Low Risk  (09/11/2021)   Patient History    Smoking Tobacco Use: Never    Smokeless Tobacco Use: Never    Passive Exposure: Not on file  Transportation Needs: No Transportation Needs (11/12/2021)   PRAPARE - Transportation    Lack of Transportation (Medical): No    Lack of Transportation (Non-Medical): No    CCM Care Plan  Allergies  Allergen Reactions   Morphine Nausea And Vomiting   Morphine And Related Nausea And Vomiting    Medications Reviewed Today     Reviewed by Lane Hacker, [facility] (Pharmacist) on 11/12/21 at 1159  Med List Status: <None>   Medication Order Taking? Sig Documenting Provider Last Dose Status Informant  acetaminophen (TYLENOL) 325 MG tablet [id number] No Take 650 mg by mouth every 6 (six) hours as needed. Takes at bedtime and occasionally in am for back/hip pain [provider] Taking Active Self  atorvastatin (LIPITOR) 20 MG tablet [id number] No TAKE 1 TABLET BY MOUTH  DAILY AT 6 PM. [name], Jolene T, NP Taking Active   B Complex-C-Folic Acid (SUPER B COMPLEX/FA/VIT C PO) [id number] No Take 1 tablet by mouth daily. Contains 845 mcg folic acid [provider] Taking Active Self  carvedilol (COREG) 6.25 MG tablet [id number] No TAKE 1 TABLET BY MOUTH TWICE  DAILY [name], Jolene T, NP Taking Active   cholecalciferol (VITAMIN D3) 25 MCG (1000 UNIT) tablet [id number] No Take 2,000 Units by mouth daily.  [provider] Taking Active   DULoxetine (CYMBALTA) 60 MG capsule [id number] No TAKE 1 CAPSULE BY MOUTH DAILY [name], Jolene T, NP Taking Active   finasteride (PROSCAR) 5 MG tablet [id number] No Take 1 tablet (5 mg total) by mouth daily. [name], MD Taking Active   folic acid (FOLVITE) 1  MG tablet [id number] No Take 1 tablet (1 mg total) by mouth daily. [name] T, NP Taking Active   gabapentin (NEURONTIN) 600 MG tablet [id number]  TAKE 1 TABLET  BY MOUTH 3  TIMES DAILY [name], Jolene T, NP  Active   JANUVIA 100 MG tablet [id number] No TAKE 1 TABLET BY MOUTH DAILY [name], Jolene T, NP Taking Active   lidocaine (LMX) 4 % cream [id number] No Apply 1 application topically as needed. [provider] Taking Active   metFORMIN (GLUCOPHAGE) 500 MG tablet [id number] No TAKE 2 TABLETS BY MOUTH TWICE  DAILY [name], Jolene T, NP Taking Active   methotrexate (RHEUMATREX) 2.5 MG tablet [id number] No Take 2.5 mg by mouth once a week. 6 tabs weekly [provider] Taking Active   mometasone (ELOCON) 0.1 % cream [id number] No APPLY  CREAM TOPICALLY ONCE DAILY [name], Jolene T, NP Taking Active   tamsulosin (FLOMAX) 0.4 MG CAPS capsule [id number] No TAKE 1 CAPSULE BY MOUTH  DAILY [name], Jolene T, NP Taking Active   vitamin C (ASCORBIC ACID) 500 MG tablet [id number] No Take 500 mg by mouth daily. [provider] Taking Active Self            Patient Active Problem List   Diagnosis Date Noted   Aortic atherosclerosis ([place]) 05/26/2020   Rheumatoid arthritis of multiple sites with negative rheumatoid factor ([place]) 12/08/2019   B12 deficiency 07/12/2019   Vitamin D deficiency 06/30/2019   Personal history of renal cancer 10/17/2017   Advanced care planning/counseling discussion 01/13/2017   Senile purpura ([place]) 09/26/2016   Elevated PSA 01/07/2015   BPH with obstruction/lower urinary tract symptoms 01/07/2015   DM due to underlying condition with diabetic nephropathy ([place])    Hypertension associated with diabetes ([place])    Hyperlipidemia associated with type 2 diabetes mellitus ([place])     Immunization History  Administered Date(s) Administered   Fluad Quad(high Dose 65+) 01/25/2019, 03/13/2020, 02/27/2021   Influenza, High Dose Seasonal PF 03/25/2016, 01/13/2017, 03/12/2018   Influenza,inj,Quad PF,6+ Mos 03/13/2015   Influenza-Unspecified 04/18/2014   PFIZER(Purple Top)SARS-COV-2 Vaccination 05/12/2019, 06/05/2019,  02/25/2020, 11/28/2020   Pneumococcal Conjugate-13 10/19/2013   Pneumococcal Polysaccharide-23 09/03/1997, 02/26/2005, 10/10/2019, 10/11/2019   Td 02/17/2008, 09/03/2021    Conditions to be addressed/monitored:  Hypertension, Hyperlipidemia, and Diabetes  Care Plan : [place]  Updates made by Lane Hacker, [place] since 11/12/2021 12:00 AM     Problem: DM, HTN, HLD, BPH, Rheumatoid arthritis   Priority: High     Goal: disease management   Start Date: 09/04/2020  Recent Progress: On track  Priority: High  Note:   Current Barriers:  Unable to maintain control of diabetes Suboptimal therapeutic regimen for diabetes  Pharmacist Clinical Goal(s):  Patient will maintain control of diabetes as evidenced by A1c and home readings  through collaboration with PharmD and provider.   Interventions: 1:1 collaboration with Venita Lick, NP regarding development and update of comprehensive plan of care as evidenced by provider attestation and co-signature Inter-disciplinary care team collaboration (see longitudinal plan of care) Comprehensive medication review performed; medication list updated in electronic medical record   .Hypertension (BP goal <130/80) BP Readings from Last 3 Encounters:  09/11/21 116/76  09/03/21 122/71  07/02/21 122/72   -Controlled -Current treatment: Carvedilol 6.25 mg bid Appropriate, Effective, Safe, Accessible -Medications previously tried: NA  -Current home readings: 130-140/70-80s -Current dietary habits: [place]'t eat a lot of meats -Current exercise habits: no structured regimen, works in his building most  days -Denies hypotensive/hypertensive symptoms -Educated on Daily salt intake goal < 2300 mg; Exercise goal of 150 minutes per week; Importance of home blood pressure monitoring; -Counseled to monitor BP at home 2-3 times weekly, document, and provide log at future appointments -Recommended to continue current  medication   Hyperlipidemia/ CAD: (LDL goal < 70) The ASCVD Risk score (Arnett DK, et al., 2019) failed to calculate for the following reasons:   The valid total cholesterol range is 130 to 320 mg/dL Lab Results  Component Value Date   CHOL 128 06/04/2021   CHOL 104 11/27/2020   CHOL 138 06/02/2020   Lab Results  Component Value Date   HDL 44 06/04/2021   HDL 40 11/27/2020   HDL 44 06/02/2020   Lab Results  Component Value Date   LDLCALC 60 06/04/2021   LDLCALC 42 11/27/2020   LDLCALC 67 06/02/2020   Lab Results  Component Value Date   TRIG 140 06/04/2021   TRIG 126 11/27/2020   TRIG 155 (H) 06/02/2020   Lab Results  Component Value Date   CHOLHDL 2.5 03/12/2018   CHOLHDL 2.7 01/13/2017   CHOLHDL 3.6 11/21/2014  No results found for: "LDLDIRECT"  -Controlled -Current treatment: Atorvastatin 20 mg qd Appropriate, Effective, Safe, Accessible -Medications previously tried: NA -Educated on Benefits of statin for ASCVD risk reduction; Importance of limiting foods high in cholesterol; Exercise goal of 150 minutes per week; -Counseled on diet and exercise extensively Recommended to continue current medication   Diabetes (A1c goal <7%) Lab Results  Component Value Date   HGBA1C 6.7 (H) 09/03/2021   HGBA1C 8.5 (H) 06/04/2021   HGBA1C 7.5 (H) 02/27/2021   Lab Results  Component Value Date   MICROALBUR 10 06/04/2021   LDLCALC 60 06/04/2021   CREATININE 0.77 09/03/2021   Lab Results  Component Value Date   NA 140 09/03/2021   K 5.0 09/03/2021   CREATININE 0.77 09/03/2021   EGFR 92 09/03/2021   GFRNONAA 85 06/02/2020   GLUCOSE 194 (H) 09/03/2021   Lab Results  Component Value Date   WBC 5.8 06/04/2021   HGB 14.6 06/04/2021   HCT 43.8 06/04/2021   MCV 98 (H) 06/04/2021   PLT 278 06/04/2021  -Controlled    -Current medications: Metformin 1000 mg bid Appropriate, Effective, Safe, Accessible Sitagliptin 167m Appropriate, Effective, Safe,  Accessible -Medications previously tried: NA -Current home glucose readings have not checked since 4/13 when spouse had eye surgery. Will resume tomorrow.  fasting glucose:  July 2023: Doesn't test, his meter broke. Counseled on importance of checking sugars a couple times/week post prandial glucose:  -Denies hypoglycemic/hyperglycemic symptoms -Current meal patterns:  -wife reports making drop cookies while daughter was home  -Educated on A1c and blood sugar goals; Complications of diabetes including kidney damage, retinal damage, and cardiovascular disease; Benefits of routine self-monitoring of blood sugar; Patient schedule for CT with contrast tomorrow.  -Counseled to check feet daily and get yearly eye exams -Recommended to continue current medication   Rheumatoid arthritis (Goal: improve functioning , prevent AE's) -Controlled Follows with rheumatology -Current treatment  Methotrexate 15 mg q week Appropriate, Effective, Safe, Accessible Folic acid 1 mg qd Appropriate, Effective, Safe, Accessible -Medications previously tried: leflunomide - diarrhea -Counseled on diet and exercise extensively Recommended patient continue B complex with folic acid. Dosages of 5 mg folate daily have been used in studies. Advised patient to disclose to rheumatologist for completeness.  BPH/ H/o renal cancer partioal neprectomy (Goal: reduce sympotms) -Controlled follows with Urology  PSA trend:    Prostate Specific Ag, Serum  Latest Ref Rng 0.0 - 4.0 ng/mL  01/13/2017 4.8 (H)   07/29/2017 6.8 (H)   01/12/2018 5.3 (H)   10/08/2018 5.9 (H)   05/28/2019 10.3 (H)   07/06/2019 13.3 (H)   11/05/2019 5.7 (H) (started on finasteride 2 month prior)  04/12/2020 4.1 (H)   09/05/2020 4.0   09/03/2021 3.5     (H) High     IPSS       Row Name 09/11/21 1200                   International Prostate Symptom Score    How often have you had the sensation of not emptying your bladder? Not at All        How  often have you had to urinate less than every two hours? Less than 1 in 5 times        How often have you found you stopped and started again several times when you urinated? Not at All        How often have you found it difficult to postpone urination? Not at All        How often have you had a weak urinary stream? Less than 1 in 5 times        How often have you had to strain to start urination? Not at All        How many times did you typically get up at night to urinate? 1 Time        Total IPSS Score 3               Quality of Life due to urinary symptoms    If you were to spend the rest of your life with your urinary condition just the way it is now how would you feel about that? Pleased      -Current treatment  Finasteride 5 mg qd Appropriate, Effective, Safe, Accessible tamsulosin 0.4 mg qd Appropriate, Effective, Safe, Accessible -Medications previously tried: NA -Recommended to continue current medication   Patient Goals/Self-Care Activities Patient will:  - take medications as prescribed check glucose 3- 5 times weekly, document, and provide at future appointments check blood pressure 2-3 times weekly, document, and provide at future appointments target a minimum of 150 minutes of moderate intensity exercise weekly  Follow Up Plan: Telephone follow up appointment with care management team member scheduled for: Jan 2024  [place] Constable, Pharm.D. - [phone]             Medication Assistance: None required.  Patient affirms current coverage meets needs.  Compliance/Adherence/Medication fill history: Care Gaps: Colonoscopy-NA Diabetic Foot Exam-06/04/21 Ophthalmology-04/27/21 Dexa Scan - NA Annual Well Visit - NA Micro albumin-06/04/21 Hemoglobin A1c- 09/03/21   Star Rating Drugs: Metformin 500 mg-last fill 09/04/21 90 ds Atorvastatin 20 mg-last fill 06/28/21 90 ds  Patient's preferred pharmacy is:  [facility] ([place], [place] [address] [place] [address] Phone: [phone] Fax: [place], [place] [address] Phone: [phone] Fax: [phone]  [facility] ([facility] ) - [place], [address] [place] [address] Phone: [phone] Fax: [phone]  Uses pill box? No -   Pt endorses 100% compliance  We discussed: Current pharmacy is preferred with insurance plan and patient is satisfied with pharmacy services Patient decided to: Utilize [facility] pharmacy for medication synchronization, packaging and delivery  Care Plan and Follow Up Patient Decision:  Patient agrees to Care Plan and Follow-up.  Plan: The patient has been provided with contact information for the care management team and has been advised to call with any health related questions or concerns.   CPP F/U Jan 2024  [place] Constable, Pharm.D. - [phone]

## 2021-11-13 ENCOUNTER — Other Ambulatory Visit: Payer: Self-pay | Admitting: Nurse Practitioner

## 2021-11-14 ENCOUNTER — Other Ambulatory Visit: Payer: Self-pay | Admitting: Nurse Practitioner

## 2021-11-14 DIAGNOSIS — E785 Hyperlipidemia, unspecified: Secondary | ICD-10-CM

## 2021-11-14 NOTE — Telephone Encounter (Signed)
Rx 12/14/20 #90 3RF- too soon Requested Prescriptions  Pending Prescriptions Disp Refills  . tamsulosin (FLOMAX) 0.4 MG CAPS capsule [Pharmacy Med Name: Tamsulosin HCl 0.4 MG Oral Capsule] 90 capsule 3    Sig: TAKE 1 CAPSULE BY MOUTH  DAILY     Urology: Alpha-Adrenergic Blocker Passed - 11/13/2021 10:57 PM      Passed - PSA in normal range and within 360 days    Prostate Specific Ag, Serum  Date Value Ref Range Status  09/03/2021 3.5 0.0 - 4.0 ng/mL Final    Comment:    Roche ECLIA methodology. According to the American Urological Association, Serum PSA should decrease and remain at undetectable levels after radical prostatectomy. The AUA defines biochemical recurrence as an initial PSA value 0.2 ng/mL or greater followed by a subsequent confirmatory PSA value 0.2 ng/mL or greater. Values obtained with different assay methods or kits cannot be used interchangeably. Results cannot be interpreted as absolute evidence of the presence or absence of malignant disease.          Passed - Last BP in normal range    BP Readings from Last 1 Encounters:  09/11/21 116/76         Passed - Valid encounter within last 12 months    Recent Outpatient Visits          2 months ago Diabetes mellitus due to underlying condition with diabetic nephropathy, without long-term current use of insulin ([place])   [place], [place] T, NP   4 months ago Hypertension associated with diabetes ([place])   [place], Jolene T, NP   5 months ago Diabetes mellitus due to underlying condition with diabetic nephropathy, without long-term current use of insulin ([place])   [place], [place] T, NP   8 months ago Diabetes mellitus due to underlying condition with diabetic nephropathy, without long-term current use of insulin ([place])   [place], Jolene T, NP   11 months ago Diabetes mellitus due to underlying condition with diabetic  nephropathy, without long-term current use of insulin ([place])   [place], [name], NP      Future Appointments            In 2 weeks [name], [name], NP [employer]   In 2 months  [employer]

## 2021-11-14 NOTE — Telephone Encounter (Signed)
Requested Prescriptions  Pending Prescriptions Disp Refills  . atorvastatin (LIPITOR) 20 MG tablet [Pharmacy Med Name: Atorvastatin Calcium 20 MG Oral Tablet] 90 tablet 0    Sig: TAKE 1 TABLET BY MOUTH  DAILY AT 6 PM.     Cardiovascular:  Antilipid - Statins Failed - 11/14/2021  7:32 AM      Failed - Lipid Panel in normal range within the last 12 months    Cholesterol, Total  Date Value Ref Range Status  06/04/2021 128 100 - 199 mg/dL Final   Cholesterol Piccolo, Waived  Date Value Ref Range Status  10/13/2018 101 <200 mg/dL Final    Comment:                            Desirable                <200                         Borderline High      200- 239                         High                     >239    LDL Chol Calc (NIH)  Date Value Ref Range Status  06/04/2021 60 0 - 99 mg/dL Final   HDL  Date Value Ref Range Status  06/04/2021 44 >39 mg/dL Final   Triglycerides  Date Value Ref Range Status  06/04/2021 140 0 - 149 mg/dL Final   Triglycerides Piccolo,Waived  Date Value Ref Range Status  10/13/2018 91 <150 mg/dL Final    Comment:                            Normal                   <150                         Borderline High     150 - 199                         High                200 - 499                         Very High                >499          Passed - Patient is not pregnant      Passed - Valid encounter within last 12 months    Recent Outpatient Visits          2 months ago Diabetes mellitus due to underlying condition with diabetic nephropathy, without long-term current use of insulin ([place])   [place], [place] T, NP   4 months ago Hypertension associated with diabetes ([place])   [place], Jolene T, NP   5 months ago Diabetes mellitus due to underlying condition with diabetic nephropathy, without long-term current use of insulin ([place])   [place], Hurley T, NP   8 months ago  Diabetes  mellitus due to underlying condition with diabetic nephropathy, without long-term current use of insulin ([place])   [place], Jolene T, NP   11 months ago Diabetes mellitus due to underlying condition with diabetic nephropathy, without long-term current use of insulin ([place])   [place], [name], NP      Future Appointments            In 2 weeks [name], [name], NP [facility]   In 2 months  [facility]

## 2021-11-27 DIAGNOSIS — M159 Polyosteoarthritis, unspecified: Secondary | ICD-10-CM | POA: Diagnosis not present

## 2021-11-27 DIAGNOSIS — M0609 Rheumatoid arthritis without rheumatoid factor, multiple sites: Secondary | ICD-10-CM | POA: Diagnosis not present

## 2021-11-27 DIAGNOSIS — Z79899 Other long term (current) drug therapy: Secondary | ICD-10-CM | POA: Diagnosis not present

## 2021-12-02 NOTE — Patient Instructions (Signed)

## 2021-12-03 DIAGNOSIS — I152 Hypertension secondary to endocrine disorders: Secondary | ICD-10-CM | POA: Diagnosis not present

## 2021-12-03 DIAGNOSIS — E0821 Diabetes mellitus due to underlying condition with diabetic nephropathy: Secondary | ICD-10-CM | POA: Diagnosis not present

## 2021-12-03 DIAGNOSIS — E785 Hyperlipidemia, unspecified: Secondary | ICD-10-CM | POA: Diagnosis not present

## 2021-12-03 DIAGNOSIS — E1169 Type 2 diabetes mellitus with other specified complication: Secondary | ICD-10-CM | POA: Diagnosis not present

## 2021-12-03 DIAGNOSIS — E1159 Type 2 diabetes mellitus with other circulatory complications: Secondary | ICD-10-CM

## 2021-12-04 ENCOUNTER — Ambulatory Visit (INDEPENDENT_AMBULATORY_CARE_PROVIDER_SITE_OTHER): Payer: Medicare Other | Admitting: Nurse Practitioner

## 2021-12-04 ENCOUNTER — Encounter: Payer: Self-pay | Admitting: Nurse Practitioner

## 2021-12-04 VITALS — BP 122/67 | HR 73 | Temp 98.1°F | Ht 68.0 in | Wt 203.0 lb

## 2021-12-04 DIAGNOSIS — E1169 Type 2 diabetes mellitus with other specified complication: Secondary | ICD-10-CM

## 2021-12-04 DIAGNOSIS — E785 Hyperlipidemia, unspecified: Secondary | ICD-10-CM

## 2021-12-04 DIAGNOSIS — L720 Epidermal cyst: Secondary | ICD-10-CM | POA: Insufficient documentation

## 2021-12-04 DIAGNOSIS — E0821 Diabetes mellitus due to underlying condition with diabetic nephropathy: Secondary | ICD-10-CM

## 2021-12-04 DIAGNOSIS — E1159 Type 2 diabetes mellitus with other circulatory complications: Secondary | ICD-10-CM | POA: Diagnosis not present

## 2021-12-04 DIAGNOSIS — R972 Elevated prostate specific antigen [PSA]: Secondary | ICD-10-CM

## 2021-12-04 DIAGNOSIS — E538 Deficiency of other specified B group vitamins: Secondary | ICD-10-CM

## 2021-12-04 DIAGNOSIS — D692 Other nonthrombocytopenic purpura: Secondary | ICD-10-CM | POA: Diagnosis not present

## 2021-12-04 DIAGNOSIS — R229 Localized swelling, mass and lump, unspecified: Secondary | ICD-10-CM

## 2021-12-04 DIAGNOSIS — I700 Atherosclerosis of aorta: Secondary | ICD-10-CM | POA: Diagnosis not present

## 2021-12-04 DIAGNOSIS — I152 Hypertension secondary to endocrine disorders: Secondary | ICD-10-CM

## 2021-12-04 DIAGNOSIS — N401 Enlarged prostate with lower urinary tract symptoms: Secondary | ICD-10-CM

## 2021-12-04 DIAGNOSIS — E559 Vitamin D deficiency, unspecified: Secondary | ICD-10-CM | POA: Diagnosis not present

## 2021-12-04 DIAGNOSIS — M0609 Rheumatoid arthritis without rheumatoid factor, multiple sites: Secondary | ICD-10-CM | POA: Diagnosis not present

## 2021-12-04 DIAGNOSIS — N138 Other obstructive and reflux uropathy: Secondary | ICD-10-CM

## 2021-12-04 DIAGNOSIS — Z85528 Personal history of other malignant neoplasm of kidney: Secondary | ICD-10-CM

## 2021-12-04 LAB — BAYER DCA HB A1C WAIVED: HB A1C (BAYER DCA - WAIVED): 7.3 % — ABNORMAL HIGH (ref 4.8–5.6)

## 2021-12-04 MED ORDER — TAMSULOSIN HCL 0.4 MG PO CAPS: 0.4000 mg | ORAL_CAPSULE | Freq: Every day | ORAL | 4 refills | Status: DC

## 2021-12-04 MED ORDER — FINASTERIDE 5 MG PO TABS
5.0000 mg | ORAL_TABLET | Freq: Every day | ORAL | 4 refills | Status: DC
Start: 2021-12-04 — End: 2022-12-09

## 2021-12-04 MED ORDER — DULOXETINE HCL 60 MG PO CPEP: 60.0000 mg | ORAL_CAPSULE | Freq: Every day | ORAL | 4 refills | Status: DC

## 2021-12-04 MED ORDER — CARVEDILOL 6.25 MG PO TABS
6.2500 mg | ORAL_TABLET | Freq: Two times a day (BID) | ORAL | 4 refills | Status: DC
Start: 2021-12-04 — End: 2023-01-08

## 2021-12-04 MED ORDER — SITAGLIPTIN PHOSPHATE 100 MG PO TABS: 100.0000 mg | ORAL_TABLET | Freq: Every day | ORAL | 4 refills | Status: DC

## 2021-12-04 MED ORDER — ATORVASTATIN CALCIUM 20 MG PO TABS: ORAL_TABLET | ORAL | 4 refills | Status: DC

## 2021-12-04 NOTE — Assessment & Plan Note (Signed)
Ongoing and improving with supplement.  Recheck today and consider reducing or discontinuing supplement as needed. 

## 2021-12-04 NOTE — Assessment & Plan Note (Signed)
Chronic, stable, noted on exams.  Recommend continue to perform gentle skin care and monitor for skin breakdown, if present notify provider. 

## 2021-12-04 NOTE — Assessment & Plan Note (Signed)
To mid lateral left back -- ?recent trauma, but he denies this.  Is firm and approx 6 cm in length.  Will obtain imaging to further assess area and determine next steps after resulted.  Recommend warm compresses and Voltaren gel as needed at home.

## 2021-12-04 NOTE — Assessment & Plan Note (Addendum)
Followed by urology, continue this collaboration and reviewed recent notes.

## 2021-12-04 NOTE — Assessment & Plan Note (Signed)
Chronic, ongoing in patient with history of renal cell CA.  A1c today trend up to 7.3%, from 6.7% previous due to dietary indiscretions.  Did not tolerate Trulicity, GI issues with this.   - Continue Metformin 1000 MG BID and Januvia 100 MG daily, educated him on these.   - Monitor BS daily in morning at home.  - Eye and foot exam up to date - Due to his age and risk for hypoglycemia would avoid return to Glipizide. Could trial SGLT2, but with urinary symptoms may not tolerate. Return to office 3 months.

## 2021-12-04 NOTE — Assessment & Plan Note (Signed)
Chronic, ongoing.  Followed by rheumatology with much improved functional status since initial visit with patient years ago.  Continue this collaboration and current regimen as prescribed by them.  Recent note reviewed.  Discussed that he can take occasional Tylenol as needed for back pain, max dosing 3000 MG daily.

## 2021-12-04 NOTE — Assessment & Plan Note (Addendum)
Chronic, stable with BP remaining at goal at office visits.  Recommend he monitor BP at home at least three mornings a week + focus on DASH diet.  Continue current medication regimen and adjust as needed.  Labs: A1c.  Urine ALB 15 May 2021.  Return in 3 months.

## 2021-12-04 NOTE — Assessment & Plan Note (Signed)
Chronic, ongoing.  Continue current medication regimen and collaboration with urology.  Recent note reviewed.

## 2021-12-04 NOTE — Assessment & Plan Note (Signed)
Chronic, ongoing.  Continue current medication regimen and adjust as needed. Lipid panel today. 

## 2021-12-04 NOTE — Progress Notes (Signed)
BP 122/67   Pulse 73   Temp 98.1 F (36.7 C) (Oral)   Ht '5\' 8"'$  (1.727 m)   Wt 203 lb (92.1 kg)   SpO2 97%   BMI 30.87 kg/m    Subjective:    Patient ID: Jason Contreras, male    DOB: November 17, 1940, 81 y.o.   MRN: [id number]  HPI: Jason Contreras is a 81 y.o. male  Chief Complaint  Patient presents with   Diabetes   Hyperlipidemia   Hypertension   Rheumatoid Arthritis   Prostate Cancer   Vitamin B12   Vitamin D   Medication Refill    Patient is requesting refills on his medications and patient says he was informed that he needed a new Rx for one of his current medication as he spoke with his pharmacy and told his prescription expired.    DIABETES Last A1c in May was 6.7%.  Taking Metformin 1000 MG BID and added Januvia 100 MG daily, discontinued Actos and Amaryl over past years due to age and heart health.  Tried Trulicity which caused GI upset and changed to Januvia -- has been out of medication 3 days, is coming in mail.  Does endorse ongoing dietary indiscretions over past 3 months. Hypoglycemic episodes:no Polydipsia/polyuria: no Visual disturbance: no Chest pain: no Paresthesias: no Glucose Monitoring: no  Accucheck frequency: Not Checking  Fasting glucose:  Post prandial:  Evening:  Before meals: Taking Insulin?: no  Long acting insulin:  Short acting insulin: Blood Pressure Monitoring: rarely Retinal Examination: Up to Date Foot Exam: Up to Date Pneumovax: Up to Date Influenza: Up to Date Aspirin: no   HYPERTENSION / HYPERLIPIDEMIA Taking Coreg 6.25 MG BID + Atorvastatin 20 MG daily.  Satisfied with current treatment? yes Duration of hypertension: chronic BP monitoring frequency: rarely BP range:  BP medication side effects: no Duration of hyperlipidemia: chronic Cholesterol medication side effects: no Cholesterol supplements: none Medication compliance: good compliance Aspirin: no Recent stressors: no Recurrent headaches: no Visual changes:  no Palpitations: no Dyspnea: no Chest pain: no Lower extremity edema: no Dizzy/lightheaded: no The ASCVD Risk score (Arnett DK, et al., 2019) failed to calculate for the following reasons:   The valid total cholesterol range is 130 to 320 mg/dL     RHEUMATOID ARTHRITIS Followed by rheumatology for RA of multiple sites with negative Rf. Last visit 11/27/21. Taking Methotrexate and Duloxetine + Folic Acid.  Has occasional joint pains, but much improved. Pain control status: stable Duration: chronic Location: multiple areas What Activities task can be accomplished with current medication? ADL's daily Previous pain specialty evaluation: yes Non-narcotic analgesic meds: yes  BPH & PROSTATE CANCER HISTORY Follows with urology, last visit 09/11/21.  Continues Flomax and Finasteride. BPH status: stable Satisfied with current treatment?: yes Medication side effects: no Medication compliance: good compliance Duration: chronic Nocturia: no Urinary frequency:no Incomplete voiding: no Urgency: no Weak urinary stream: no Straining to start stream: no  SKIN LESION To left mid, exterior back.  Presented 1 1/2 to 2 weeks.  He feels are has gotten bigger.  Tender to lie on.  No recent injuries. Duration: weeks Location: as noted above Painful: yes to lay on Itching: no Onset: sudden Context: bigger Associated signs and symptoms:  History of skin cancer: no History of precancerous skin lesions: no Family history of skin cancer: no   Relevant past medical, surgical, family and social history reviewed and updated as indicated. Interim medical history since our last visit reviewed. Allergies and medications  reviewed and updated.  Review of Systems  Per HPI unless specifically indicated above     Objective:    BP 122/67   Pulse 73   Temp 98.1 F (36.7 C) (Oral)   Ht '5\' 8"'$  (1.727 m)   Wt 203 lb (92.1 kg)   SpO2 97%   BMI 30.87 kg/m   Wt Readings from Last 3 Encounters:  12/04/21  203 lb (92.1 kg)  09/11/21 201 lb (91.2 kg)  09/03/21 201 lb 6.4 oz (91.4 kg)    Physical Exam Vitals and nursing note reviewed.  Constitutional:      General: He is awake.     Appearance: He is well-developed and overweight.  HENT:     Head: Normocephalic and atraumatic.     Right Ear: Hearing normal. No drainage.     Left Ear: Hearing normal. No drainage.  Eyes:     General: Lids are normal.        Right eye: No discharge.        Left eye: No discharge.     Conjunctiva/sclera: Conjunctivae normal.     Pupils: Pupils are equal, round, and reactive to light.  Neck:     Thyroid: No thyromegaly.     Vascular: No carotid bruit or JVD.     Trachea: Trachea normal.  Cardiovascular:     Rate and Rhythm: Normal rate and regular rhythm.     Heart sounds: Normal heart sounds, S1 normal and S2 normal. No murmur heard.    No gallop.  Pulmonary:     Effort: Pulmonary effort is normal. No accessory muscle usage or respiratory distress.     Breath sounds: Normal breath sounds.  Abdominal:     General: Bowel sounds are normal.     Palpations: Abdomen is soft.  Musculoskeletal:        General: Normal range of motion.     Cervical back: Normal range of motion and neck supple.     Right lower leg: No edema.     Left lower leg: No edema.  Skin:    General: Skin is warm and dry.     Capillary Refill: Capillary refill takes less than 2 seconds.          Comments: Scattered pale purple/yellow bruises noted to BUE.  Neurological:     Mental Status: He is alert and oriented to person, place, and time.     Coordination: Coordination is intact.     Gait: Gait is intact.     Deep Tendon Reflexes: Reflexes are normal and symmetric.     Reflex Scores:      Brachioradialis reflexes are 2+ on the right side and 2+ on the left side.      Patellar reflexes are 2+ on the right side and 2+ on the left side. Psychiatric:        Mood and Affect: Mood normal.        Behavior: Behavior normal.  Behavior is cooperative.        Thought Content: Thought content normal.        Judgment: Judgment normal.     Results for orders placed or performed in visit on 12/04/21  Bayer DCA Hb A1c Waived  Result Value Ref Range   HB A1C (BAYER DCA - WAIVED) 7.3 (H) 4.8 - 5.6 %      Assessment & Plan:   Problem List Items Addressed This Visit       Cardiovascular and Mediastinum  Aortic atherosclerosis (HCC)   Relevant Medications   carvedilol (COREG) 6.25 MG tablet   atorvastatin (LIPITOR) 20 MG tablet   Other Relevant Orders   Lipid Panel w/o Chol/HDL Ratio   Hypertension associated with diabetes ([place])    Chronic, stable with BP remaining at goal at office visits.  Recommend he monitor BP at home at least three mornings a week + focus on DASH diet.  Continue current medication regimen and adjust as needed.  Labs: A1c.  Urine ALB 15 May 2021.  Return in 3 months.      Relevant Medications   sitaGLIPtin (JANUVIA) 100 MG tablet   carvedilol (COREG) 6.25 MG tablet   atorvastatin (LIPITOR) 20 MG tablet   Other Relevant Orders   Bayer DCA Hb A1c Waived (Completed)   Senile purpura (HCC)    Chronic, stable, noted on exams.  Recommend continue to perform gentle skin care and monitor for skin breakdown, if present notify provider.      Relevant Medications   carvedilol (COREG) 6.25 MG tablet   atorvastatin (LIPITOR) 20 MG tablet     Endocrine   DM due to underlying condition with diabetic nephropathy (HCC) - Primary    Chronic, ongoing in patient with history of renal cell CA.  A1c today trend up to 7.3%, from 6.7% previous due to dietary indiscretions.  Did not tolerate Trulicity, GI issues with this.   - Continue Metformin 1000 MG BID and Januvia 100 MG daily, educated him on these.   - Monitor BS daily in morning at home.  - Eye and foot exam up to date - Due to his age and risk for hypoglycemia would avoid return to Glipizide. Could trial SGLT2, but with urinary symptoms may  not tolerate. Return to office 3 months.      Relevant Medications   sitaGLIPtin (JANUVIA) 100 MG tablet   atorvastatin (LIPITOR) 20 MG tablet   Other Relevant Orders   Bayer DCA Hb A1c Waived (Completed)   Hyperlipidemia associated with type 2 diabetes mellitus (HCC)    Chronic, ongoing.  Continue current medication regimen and adjust as needed.  Lipid panel today.      Relevant Medications   sitaGLIPtin (JANUVIA) 100 MG tablet   carvedilol (COREG) 6.25 MG tablet   atorvastatin (LIPITOR) 20 MG tablet   Other Relevant Orders   Bayer DCA Hb A1c Waived (Completed)   Lipid Panel w/o Chol/HDL Ratio     Musculoskeletal and Integument   Rheumatoid arthritis of multiple sites with negative rheumatoid factor (HCC)    Chronic, ongoing.  Followed by rheumatology with much improved functional status since initial visit with patient years ago.  Continue this collaboration and current regimen as prescribed by them.  Recent note reviewed.  Discussed that he can take occasional Tylenol as needed for back pain, max dosing 3000 MG daily.        Genitourinary   BPH with obstruction/lower urinary tract symptoms    Chronic, ongoing.  Continue current medication regimen and collaboration with urology.  Recent note reviewed.      Relevant Medications   tamsulosin (FLOMAX) 0.4 MG CAPS capsule   finasteride (PROSCAR) 5 MG tablet     Other   B12 deficiency    Ongoing and improving with supplement.  Recheck today and consider reducing or discontinuing supplement as needed.      Relevant Orders   Vitamin B12   Personal history of renal cancer    Followed by urology, continue this collaboration  and reviewed recent notes.       Skin mass    To mid lateral left back -- ?recent trauma, but he denies this.  Is firm and approx 6 cm in length.  Will obtain imaging to further assess area and determine next steps after resulted.  Recommend warm compresses and Voltaren gel as needed at home.       Relevant Orders   [place] CHEST SOFT TISSUE   Vitamin D deficiency    Ongoing.  Continue daily supplement and recheck level today, recent was stable.      Relevant Orders   VITAMIN D 25 Hydroxy (Vit-D Deficiency, Fractures)     Follow up plan: Return in about 4 weeks (around 01/01/2022) for Soft Tissue Mass + 3 months for T2DM, HTN/HLD, BPH.

## 2021-12-04 NOTE — Assessment & Plan Note (Signed)
Ongoing.  Continue daily supplement and recheck level today, recent was stable. 

## 2021-12-05 LAB — LIPID PANEL W/O CHOL/HDL RATIO
Cholesterol, Total: 104 mg/dL (ref 100–199)
HDL: 40 mg/dL (ref >39)
LDL Chol Calc (NIH): 42 mg/dL (ref 0–99)
Triglycerides: 125 mg/dL (ref 0–149)
VLDL Cholesterol Cal: 22 mg/dL (ref 5–40)

## 2021-12-05 LAB — VITAMIN D 25 HYDROXY (VIT D DEFICIENCY, FRACTURES): Vit D, 25-Hydroxy: 34.2 ng/mL (ref 30.0–100.0)

## 2021-12-05 LAB — VITAMIN B12: Vitamin B-12: 1087 pg/mL (ref 232–1245)

## 2021-12-05 NOTE — Progress Notes (Signed)
Contacted via MyChart   Good evening Jason Contreras, your labs have returned and overall these look great.  No medication changes needed.  Thank you and Tonja for being such wonderful patients.  You inspire me:) Keep being awesome!!  Thank you for allowing me to participate in your care.  I appreciate you. Kindest regards, Willliam Pettet

## 2021-12-12 ENCOUNTER — Ambulatory Visit
Admission: RE | Admit: 2021-12-12 | Discharge: 2021-12-12 | Disposition: A | Discharge status: Home / Self Care | Payer: Medicare Other | Source: Ambulatory Visit | Attending: Nurse Practitioner | Admitting: Nurse Practitioner

## 2021-12-12 DIAGNOSIS — R229 Localized swelling, mass and lump, unspecified: Secondary | ICD-10-CM | POA: Diagnosis not present

## 2021-12-12 DIAGNOSIS — R600 Localized edema: Secondary | ICD-10-CM | POA: Diagnosis not present

## 2021-12-13 NOTE — Progress Notes (Signed)
Contacted via [place], your imaging has returned and the area of concern looks like a cyst, benign suspected.  If this is bothering you I recommend we place a referral to general surgery to assess if removal is an option.  Is this something you would like to pursue?  Let me know:)

## 2021-12-29 NOTE — Patient Instructions (Signed)
Epidermoid Cyst  An epidermoid cyst, also called an epidermal cyst, is a small lump under your skin. The cyst contains a substance called keratin. Do not try to pop or open the cyst yourself. What are the causes? A blocked hair follicle. A hair that curls and re-enters the skin instead of growing straight out of the skin. A blocked pore. Irritated skin. An injury to the skin. Certain conditions that are passed along from parent to child. Human papillomavirus (HPV). This happens rarely when cysts occur on the bottom of the feet. Long-term sun damage to the skin. What increases the risk? Having acne. Being male. Having an injury to the skin. Being past puberty. Having certain conditions caused by genes (genetic disorder) What are the signs or symptoms? These cysts are usually harmless, but they can get infected. Symptoms of infection may include: Redness. Inflammation. Tenderness. Warmth. Fever. A bad-smelling substance that drains from the cyst. Pus that drains from the cyst. How is this treated? In many cases, epidermoid cysts go away on their own without treatment. If a cyst becomes infected, treatment may include: Opening and draining the cyst, done by a doctor. After draining, you may need minor surgery to remove the rest of the cyst. Antibiotic medicine. Shots of medicines (steroids) that help to reduce inflammation. Surgery to remove the cyst. Surgery may be done if the cyst: Becomes large. Bothers you. Has a chance of turning into cancer. Do not try to open a cyst yourself. Follow these instructions at home: Medicines Take over-the-counter and prescription medicines as told by your doctor. If you were prescribed an antibiotic medicine, take it as told by your doctor. Do not stop taking it even if you start to feel better. General instructions Keep the area around your cyst clean and dry. Wear loose, dry clothing. Avoid touching your cyst. Check your cyst every day  for signs of infection. Check for: Redness, swelling, or pain. Fluid or blood. Warmth. Pus or a bad smell. Keep all follow-up visits. How is this prevented? Wear clean, dry, clothing. Avoid wearing tight clothing. Keep your skin clean and dry. Take showers or baths every day. Contact a doctor if: Your cyst has symptoms of infection. Your condition does not improve or gets worse. You have a cyst that looks different from other cysts you have had. You have a fever. Get help right away if: Redness spreads from the cyst into the area close by. Summary An epidermoid cyst is a small lump under your skin. If a cyst becomes infected, treatment may include surgery to open and drain the cyst, or to remove it. Take over-the-counter and prescription medicines only as told by your doctor. Contact a doctor if your condition is not improving or is getting worse. Keep all follow-up visits. This information is not intended to replace advice given to you by your health care provider. Make sure you discuss any questions you have with your health care provider. Document Revised: 07/28/2019 Document Reviewed: 07/28/2019 Elsevier Patient Education  2023 Elsevier Inc.  

## 2022-01-02 ENCOUNTER — Ambulatory Visit (INDEPENDENT_AMBULATORY_CARE_PROVIDER_SITE_OTHER): Payer: Medicare Other | Admitting: Nurse Practitioner

## 2022-01-02 ENCOUNTER — Encounter: Payer: Self-pay | Admitting: Nurse Practitioner

## 2022-01-02 DIAGNOSIS — L720 Epidermal cyst: Secondary | ICD-10-CM | POA: Diagnosis not present

## 2022-01-02 NOTE — Progress Notes (Signed)
BP 136/77   Pulse 67   Temp 98.2 F (36.8 C) (Oral)   Ht '5\' 8"'$  (1.727 m)   Wt 203 lb 8 oz (92.3 kg)   SpO2 96%   BMI 30.94 kg/m    Subjective:    Patient ID: Jason Contreras, male    DOB: 23-May-1940, 81 y.o.   MRN: [id number]  HPI: Jason Contreras is a 81 y.o. male  Chief Complaint  Patient presents with   Mass    Patient is here for a 4 week follow up on Soft Tissue Mass. Patient denies having any concerns at today's visit.    SKIN LESION Follow-up for skin mass, ultrasound performed on 12/13/21 with epidermal inclusion cyst noted.  To left mid, exterior back.  Presented 1 1/2 to 2 weeks. They report that during ultrasound the area must have opened because it started leaking after this -- his wife and him have been cleaning and placing abx ointment on area.  He reports area no longer present. Duration: weeks Location: as noted above Painful: yes to lay on Itching: no Onset: sudden Context: bigger Associated signs and symptoms: none History of skin cancer: no History of precancerous skin lesions: no Family history of skin cancer: no   Relevant past medical, surgical, family and social history reviewed and updated as indicated. Interim medical history since our last visit reviewed. Allergies and medications reviewed and updated.  Review of Systems  Constitutional:  Negative for activity change, diaphoresis, fatigue and fever.  Respiratory:  Negative for cough, chest tightness, shortness of breath and wheezing.   Cardiovascular:  Negative for chest pain, palpitations and leg swelling.  Gastrointestinal: Negative.   Endocrine: Negative for polydipsia, polyphagia and polyuria.  Neurological:  Negative for dizziness, syncope, weakness and numbness.  Psychiatric/Behavioral: Negative.      Per HPI unless specifically indicated above     Objective:    BP 136/77   Pulse 67   Temp 98.2 F (36.8 C) (Oral)   Ht '5\' 8"'$  (1.727 m)   Wt 203 lb 8 oz (92.3 kg)   SpO2 96%   BMI  30.94 kg/m   Wt Readings from Last 3 Encounters:  01/02/22 203 lb 8 oz (92.3 kg)  12/04/21 203 lb (92.1 kg)  09/11/21 201 lb (91.2 kg)    Physical Exam Vitals and nursing note reviewed.  Constitutional:      General: He is awake.     Appearance: He is well-developed and overweight.  HENT:     Head: Normocephalic and atraumatic.     Right Ear: Hearing normal. No drainage.     Left Ear: Hearing normal. No drainage.  Eyes:     General: Lids are normal.        Right eye: No discharge.        Left eye: No discharge.     Conjunctiva/sclera: Conjunctivae normal.     Pupils: Pupils are equal, round, and reactive to light.  Neck:     Thyroid: No thyromegaly.     Vascular: No carotid bruit or JVD.     Trachea: Trachea normal.  Cardiovascular:     Rate and Rhythm: Normal rate and regular rhythm.     Heart sounds: Normal heart sounds, S1 normal and S2 normal. No murmur heard.    No gallop.  Pulmonary:     Effort: Pulmonary effort is normal. No accessory muscle usage or respiratory distress.     Breath sounds: Normal breath sounds.  Abdominal:  General: Bowel sounds are normal.     Palpations: Abdomen is soft.  Musculoskeletal:        General: Normal range of motion.     Cervical back: Normal range of motion and neck supple.     Right lower leg: No edema.     Left lower leg: No edema.  Skin:    General: Skin is warm and dry.     Capillary Refill: Capillary refill takes less than 2 seconds.          Comments: Scattered pale purple/yellow bruises noted to BUE.  Neurological:     Mental Status: He is alert and oriented to person, place, and time.     Coordination: Coordination is intact.     Gait: Gait is intact.     Deep Tendon Reflexes: Reflexes are normal and symmetric.     Reflex Scores:      Brachioradialis reflexes are 2+ on the right side and 2+ on the left side.      Patellar reflexes are 2+ on the right side and 2+ on the left side. Psychiatric:        Mood and  Affect: Mood normal.        Behavior: Behavior normal. Behavior is cooperative.        Thought Content: Thought content normal.        Judgment: Judgment normal.     Results for orders placed or performed in visit on 12/04/21  Bayer DCA Hb A1c Waived  Result Value Ref Range   HB A1C (BAYER DCA - WAIVED) 7.3 (H) 4.8 - 5.6 %  Lipid Panel w/o Chol/HDL Ratio  Result Value Ref Range   Cholesterol, Total 104 100 - 199 mg/dL   Triglycerides 125 0 - 149 mg/dL   HDL 40 >39 mg/dL   VLDL Cholesterol Cal 22 5 - 40 mg/dL   LDL Chol Calc (NIH) 42 0 - 99 mg/dL  VITAMIN D 25 Hydroxy (Vit-D Deficiency, Fractures)  Result Value Ref Range   Vit D, 25-Hydroxy 34.2 30.0 - 100.0 ng/mL  Vitamin B12  Result Value Ref Range   Vitamin B-12 1,087 232 - 1,245 pg/mL      Assessment & Plan:   Problem List Items Addressed This Visit       Other   Epidermal inclusion cyst    Currently has drained and reduced in size, although still some mild firmness present.  He wishes to monitor at this time as no more pain and overall reduction in size.  Discussed with him and any increase in size or discomfort then will consider referral to general surgery to ensure full removal of cyst.        Follow up plan: Return for as scheduled in upcoming months.

## 2022-01-02 NOTE — Assessment & Plan Note (Signed)
Currently has drained and reduced in size, although still some mild firmness present.  He wishes to monitor at this time as no more pain and overall reduction in size.  Discussed with him and any increase in size or discomfort then will consider referral to general surgery to ensure full removal of cyst.

## 2022-01-17 ENCOUNTER — Telehealth: Payer: Self-pay

## 2022-01-17 NOTE — Progress Notes (Signed)
Chronic Care Management Pharmacy Assistant   Name: DAVIDSON PALMIERI  MRN: [id number] DOB: Dec 22, 1940    Reason for Encounter: Disease State   Conditions to be addressed/monitored: DMII   Recent office visits:  01/02/22 [name] T, NP (Epidermal inclusion cyst) Orders place: none; Medication changes: none  12/04/21 [name], Henrine Screws T, NP (Diabetes,hyperlip, hypertension, arthritis, prostate cancer,) Orders place: Labs; Medication changes: atorvastatin 20 mg, sitagliptin phosphate 100 mg  Recent consult visits:  None since the last coordination call  Hospital visits:  None in previous 6 months  Medications: Outpatient Encounter Medications as of 01/17/2022  Medication Sig   acetaminophen (TYLENOL) 325 MG tablet Take 650 mg by mouth every 6 (six) hours as needed. Takes at bedtime and occasionally in am for back/hip pain   atorvastatin (LIPITOR) 20 MG tablet TAKE 1 TABLET BY MOUTH  DAILY AT 6 PM.   B Complex-C-Folic Acid (SUPER B COMPLEX/FA/VIT C PO) Take 1 tablet by mouth daily. Contains 254 mcg folic acid   carvedilol (COREG) 6.25 MG tablet Take 1 tablet (6.25 mg total) by mouth 2 (two) times daily.   cholecalciferol (VITAMIN D3) 25 MCG (1000 UNIT) tablet Take 2,000 Units by mouth daily.    DULoxetine (CYMBALTA) 60 MG capsule Take 1 capsule (60 mg total) by mouth daily.   finasteride (PROSCAR) 5 MG tablet Take 1 tablet (5 mg total) by mouth daily.   folic acid (FOLVITE) 1 MG tablet Take 1 tablet (1 mg total) by mouth daily.   gabapentin (NEURONTIN) 600 MG tablet TAKE 1 TABLET BY MOUTH 3  TIMES DAILY   lidocaine (LMX) 4 % cream Apply 1 application topically as needed.   metFORMIN (GLUCOPHAGE) 500 MG tablet TAKE 2 TABLETS BY MOUTH TWICE  DAILY   methotrexate (RHEUMATREX) 2.5 MG tablet Take 2.5 mg by mouth once a week. 6 tabs weekly   mometasone (ELOCON) 0.1 % cream APPLY  CREAM TOPICALLY ONCE DAILY   sitaGLIPtin (JANUVIA) 100 MG tablet Take 1 tablet (100 mg total) by mouth  daily.   tamsulosin (FLOMAX) 0.4 MG CAPS capsule Take 1 capsule (0.4 mg total) by mouth daily.   vitamin C (ASCORBIC ACID) 500 MG tablet Take 500 mg by mouth daily.   No facility-administered encounter medications on file as of 01/17/2022.   Recent Relevant Labs: Lab Results  Component Value Date/Time   HGBA1C 7.3 (H) 12/04/2021 02:32 PM   HGBA1C 6.7 (H) 09/03/2021 02:03 PM   HGBA1C 6.3 12/27/2015 12:00 AM   MICROALBUR 10 06/04/2021 11:21 AM   MICROALBUR 10 06/14/2020 11:04 AM    Kidney Function Lab Results  Component Value Date/Time   CREATININE 0.77 09/03/2021 02:05 PM   CREATININE 0.85 06/04/2021 11:23 AM   GFRNONAA 85 06/02/2020 02:54 PM   GFRAA 99 06/02/2020 02:54 PM   3 attempts were made to speak with the patient for diabetic assessment call. Lvm for patient to return call.  Current antihyperglycemic regimen:  Metformin 1000 mg bid  Sitagliptin '100mg'$    Adherence Review: Is the patient currently on a STATIN medication? No Is the patient currently on ACE/ARB medication? No Does the patient have >5 day gap between last estimated fill dates? No    Care Gaps: Colonoscopy-NA Diabetic Foot Exam-06/04/21 Ophthalmology-04/27/21 Dexa Scan - NA Annual Well Visit - NA Micro albumin-06/04/21 Hemoglobin A1c- 12/04/21  Star Rating Drugs: Atorvastatin 20 mg-last fill 12/13/21 90 ds, 09/19/21 90 ds Metformin 500 mg-last fill 11/28/21 90 ds, 09/04/21 90 ds   [facility]  Concierge [phone]

## 2022-01-22 ENCOUNTER — Ambulatory Visit: Payer: Self-pay

## 2022-01-22 ENCOUNTER — Telehealth: Payer: Medicare Other

## 2022-01-22 ENCOUNTER — Other Ambulatory Visit: Payer: Self-pay | Admitting: Nurse Practitioner

## 2022-01-22 MED ORDER — MOMETASONE FUROATE 0.1 % EX CREA: TOPICAL_CREAM | CUTANEOUS | 3 refills | Status: AC

## 2022-01-22 NOTE — Patient Outreach (Signed)
  Care Coordination   Follow Up Visit Note   01/22/2022 Name: Jason Contreras MRN: [id number] DOB: [birth year]  Jason Contreras is a 81 y.o. year old male who sees [place], Henrine Screws T, NP for primary care. I spoke with  Jason Contreras by phone today.  What matters to the patients health and wellness today?  Keeping his blood sugars in better control and need for refill on medications    Goals Addressed             This Visit's Progress    RNCM: Effective Management of DM       Care Coordination Interventions: Lab Results  Component Value Date   HGBA1C 7.3 (H) 12/04/2021    Provided education to patient about basic DM disease process Reviewed medications with patient and discussed importance of medication adherence Counseled on importance of regular laboratory monitoring as prescribed Discussed plans with patient for ongoing care management follow up and provided patient with direct contact information for care management team Provided patient with written educational materials related to hypo and hyperglycemia and importance of correct treatment Reviewed scheduled/upcoming provider appointments including: 03-13-2022 at 230 pm Advised patient, providing education and rationale, to check cbg as directed  and record, calling pcp for findings outside established parameters. The patient is not checking his blood sugars. Review of the benefits of checking blood sugars and the patient agrees to start checking periodically. Education and support given.  Review of patient status, including review of consultants reports, relevant laboratory and other test results, and medications completed Screening for signs and symptoms of depression related to chronic disease state  Assessed social determinant of health barriers Patient ask for assistance with getting memetasone cream refill. In basket message sent to the pcp asking for assistance in getting refill.   Active listening / Reflection utilized   Emotional Support Provided         SDOH assessments and interventions completed:  Yes  SDOH Interventions Today    Flowsheet Row Most Recent Value  SDOH Interventions   Food Insecurity Interventions Intervention Not Indicated  Housing Interventions Intervention Not Indicated  Transportation Interventions Intervention Not Indicated  Utilities Interventions Intervention Not Indicated  Alcohol Usage Interventions Intervention Not Indicated (Score <7)  Financial Strain Interventions Intervention Not Indicated  Stress Interventions Intervention Not Indicated        Care Coordination Interventions Activated:  Yes  Care Coordination Interventions:  Yes, provided   Follow up plan: Follow up call scheduled for 03-13-2022 at 230 pm    Encounter Outcome:  Pt. Visit Completed   Noreene Larsson RN, MSN, [place]  Mobile: [phone]

## 2022-01-22 NOTE — Patient Instructions (Signed)
Visit Information  Thank you for taking time to visit with me today. Please Mindy't hesitate to contact me if I can be of assistance to you.   Following are the goals we discussed today:   Goals Addressed             This Visit's Progress    RNCM: Effective Management of DM       Care Coordination Interventions: Lab Results  Component Value Date   HGBA1C 7.3 (H) 12/04/2021    Provided education to patient about basic DM disease process Reviewed medications with patient and discussed importance of medication adherence Counseled on importance of regular laboratory monitoring as prescribed Discussed plans with patient for ongoing care management follow up and provided patient with direct contact information for care management team Provided patient with written educational materials related to hypo and hyperglycemia and importance of correct treatment Reviewed scheduled/upcoming provider appointments including: 03-13-2022 at 230 pm Advised patient, providing education and rationale, to check cbg as directed  and record, calling pcp for findings outside established parameters. The patient is not checking his blood sugars. Review of the benefits of checking blood sugars and the patient agrees to start checking periodically. Education and support given.  Review of patient status, including review of consultants reports, relevant laboratory and other test results, and medications completed Screening for signs and symptoms of depression related to chronic disease state  Assessed social determinant of health barriers Patient ask for assistance with getting memetasone cream refill. In basket message sent to the pcp asking for assistance in getting refill.   Active listening / Reflection utilized  Emotional Support Provided         Our next appointment is by telephone on 03-13-2022 at 230 pm  Please call the care guide team at [phone] if you need to cancel or reschedule your appointment.    If you are experiencing a Mental Health or [place] or need someone to talk to, please call the Suicide and Crisis Lifeline: 988 call the [place] National Suicide Prevention Lifeline: (419)128-3694 or TTY: (903)675-7253 TTY 780-881-9597) to talk to a trained counselor call 1-800-273-TALK (toll free, 24 hour hotline)  Patient verbalizes understanding of instructions and care plan provided today and agrees to view in [place]. Active MyChart status and patient understanding of how to access instructions and care plan via MyChart confirmed with patient.     Telephone follow up appointment with care management team member scheduled for: 03-13-2022 at 230 pm  [place], MSN, [place]  Mobile: [phone]

## 2022-02-04 ENCOUNTER — Ambulatory Visit (INDEPENDENT_AMBULATORY_CARE_PROVIDER_SITE_OTHER): Payer: Medicare Other | Admitting: *Deleted

## 2022-02-04 DIAGNOSIS — Z0000 Encounter for general adult medical examination without abnormal findings: Secondary | ICD-10-CM | POA: Diagnosis not present

## 2022-02-04 NOTE — Patient Instructions (Signed)
Jason Contreras , Thank you for taking time to come for your Medicare Wellness Visit. I appreciate your ongoing commitment to your health goals. Please review the following plan we discussed and let me know if I can assist you in the future.   Screening recommendations/referrals: Colonoscopy: no longer required Recommended yearly ophthalmology/optometry visit for glaucoma screening and checkup Recommended yearly dental visit for hygiene and checkup  Vaccinations: Influenza vaccine: Education provided Pneumococcal vaccine: up to date Tdap vaccine: up to date Shingles vaccine: Education provided    Advanced directives: yes   Conditions/risks identified:   Next appointment: 03-06-2022 @ 2:20  [place] 65 Years and Older, Male Preventive care refers to lifestyle choices and visits with your health care provider that can promote health and wellness. What does preventive care include? A yearly physical exam. This is also called an annual well check. Dental exams once or twice a year. Routine eye exams. Ask your health care provider how often you should have your eyes checked. Personal lifestyle choices, including: Daily care of your teeth and gums. Regular physical activity. Eating a healthy diet. Avoiding tobacco and drug use. Limiting alcohol use. Practicing safe sex. Taking low doses of aspirin every day. Taking vitamin and mineral supplements as recommended by your health care provider. What happens during an annual well check? The services and screenings done by your health care provider during your annual well check will depend on your age, overall health, lifestyle risk factors, and family history of disease. Counseling  Your health care provider may ask you questions about your: Alcohol use. Tobacco use. Drug use. Emotional well-being. Home and relationship well-being. Sexual activity. Eating habits. History of falls. Memory and ability to understand  (cognition). Work and work Statistician. Screening  You may have the following tests or measurements: Height, weight, and BMI. Blood pressure. Lipid and cholesterol levels. These may be checked every 5 years, or more frequently if you are over 79 years old. Skin check. Lung cancer screening. You may have this screening every year starting at age 79 if you have a 30-pack-year history of smoking and currently smoke or have quit within the past 15 years. Fecal occult blood test (FOBT) of the stool. You may have this test every year starting at age 79. Flexible sigmoidoscopy or colonoscopy. You may have a sigmoidoscopy every 5 years or a colonoscopy every 10 years starting at age 79. Prostate cancer screening. Recommendations will vary depending on your family history and other risks. Hepatitis C blood test. Hepatitis B blood test. Sexually transmitted disease (STD) testing. Diabetes screening. This is done by checking your blood sugar (glucose) after you have not eaten for a while (fasting). You may have this done every 1-3 years. Abdominal aortic aneurysm (AAA) screening. You may need this if you are a current or former smoker. Osteoporosis. You may be screened starting at age 79 if you are at high risk. Talk with your health care provider about your test results, treatment options, and if necessary, the need for more tests. Vaccines  Your health care provider may recommend certain vaccines, such as: Influenza vaccine. This is recommended every year. Tetanus, diphtheria, and acellular pertussis (Tdap, Td) vaccine. You may need a Td booster every 10 years. Zoster vaccine. You may need this after age 80. Pneumococcal 13-valent conjugate (PCV13) vaccine. One dose is recommended after age 79. Pneumococcal polysaccharide (PPSV23) vaccine. One dose is recommended after age 79. Talk to your health care provider about which screenings and vaccines  you need and how often you need them. This  information is not intended to replace advice given to you by your health care provider. Make sure you discuss any questions you have with your health care provider. Document Released: 05/19/2015 Document Revised: 01/10/2016 Document Reviewed: 02/21/2015 Elsevier Interactive Patient Education  2017 [place] Prevention in the Home Falls can cause injuries. They can happen to people of all ages. There are many things you can do to make your home safe and to help prevent falls. What can I do on the outside of my home? Regularly fix the edges of walkways and driveways and fix any cracks. Remove anything that might make you trip as you walk through a door, such as a raised step or threshold. Trim any bushes or trees on the path to your home. Use bright outdoor lighting. Clear any walking paths of anything that might make someone trip, such as rocks or tools. Regularly check to see if handrails are loose or broken. Make sure that both sides of any steps have handrails. Any raised decks and porches should have guardrails on the edges. Have any leaves, snow, or ice cleared regularly. Use sand or salt on walking paths during winter. Clean up any spills in your garage right away. This includes oil or grease spills. What can I do in the bathroom? Use night lights. Install grab bars by the toilet and in the tub and shower. Do not use towel bars as grab bars. Use non-skid mats or decals in the tub or shower. If you need to sit down in the shower, use a plastic, non-slip stool. Keep the floor dry. Clean up any water that spills on the floor as soon as it happens. Remove soap buildup in the tub or shower regularly. Attach bath mats securely with double-sided non-slip rug tape. Do not have throw rugs and other things on the floor that can make you trip. What can I do in the bedroom? Use night lights. Make sure that you have a light by your bed that is easy to reach. Do not use any sheets or  blankets that are too big for your bed. They should not hang down onto the floor. Have a firm chair that has side arms. You can use this for support while you get dressed. Do not have throw rugs and other things on the floor that can make you trip. What can I do in the kitchen? Clean up any spills right away. Avoid walking on wet floors. Keep items that you use a lot in easy-to-reach places. If you need to reach something above you, use a strong step stool that has a grab bar. Keep electrical cords out of the way. Do not use floor polish or wax that makes floors slippery. If you must use wax, use non-skid floor wax. Do not have throw rugs and other things on the floor that can make you trip. What can I do with my stairs? Do not leave any items on the stairs. Make sure that there are handrails on both sides of the stairs and use them. Fix handrails that are broken or loose. Make sure that handrails are as long as the stairways. Check any carpeting to make sure that it is firmly attached to the stairs. Fix any carpet that is loose or worn. Avoid having throw rugs at the top or bottom of the stairs. If you do have throw rugs, attach them to the floor with carpet tape. Make sure  that you have a light switch at the top of the stairs and the bottom of the stairs. If you do not have them, ask someone to add them for you. What else can I do to help prevent falls? Wear shoes that: Do not have high heels. Have rubber bottoms. Are comfortable and fit you well. Are closed at the toe. Do not wear sandals. If you use a stepladder: Make sure that it is fully opened. Do not climb a closed stepladder. Make sure that both sides of the stepladder are locked into place. Ask someone to hold it for you, if possible. Clearly mark and make sure that you can see: Any grab bars or handrails. First and last steps. Where the edge of each step is. Use tools that help you move around (mobility aids) if they are  needed. These include: Canes. Walkers. Scooters. Crutches. Turn on the lights when you go into a dark area. Replace any light bulbs as soon as they burn out. Set up your furniture so you have a clear path. Avoid moving your furniture around. If any of your floors are uneven, fix them. If there are any pets around you, be aware of where they are. Review your medicines with your doctor. Some medicines can make you feel dizzy. This can increase your chance of falling. Ask your doctor what other things that you can do to help prevent falls. This information is not intended to replace advice given to you by your health care provider. Make sure you discuss any questions you have with your health care provider. Document Released: 02/16/2009 Document Revised: 09/28/2015 Document Reviewed: 05/27/2014 Elsevier Interactive Patient Education  2017 [employer].

## 2022-02-04 NOTE — Progress Notes (Signed)
Subjective:   Jason Contreras is a 81 y.o. male who presents for Medicare Annual/Subsequent preventive examination.  I connected with  [name] on 02/04/22 by a telephone enabled telemedicine application and verified that I am speaking with the correct person using two identifiers.   I discussed the limitations of evaluation and management by telemedicine. The patient expressed understanding and agreed to proceed.  Patient location: home  Provider location: Tele-home-health  .   Review of Systems     Cardiac Risk Factors include: advanced age (>5mn, >>76women);obesity (BMI >30kg/m2);diabetes mellitus;male gender;hypertension     Objective:    Today's Vitals   There is no height or weight on file to calculate BMI.     02/02/2021    2:56 PM 01/31/2020    1:04 PM 01/21/2019   10:04 AM 01/08/2018    1:43 PM 10/17/2017   12:33 PM 10/17/2017    7:02 AM 10/14/2017    2:51 PM  Advanced Directives  Does Patient Have a Medical Advance Directive? Yes Yes Yes Yes No No Yes  Type of AParamedicof [place] will [place] will Living will;Healthcare Power of [place] will     Copy of [place]ombr[place]in Chart? No - copy requested No - copy requested No - copy requested No - copy requested     Would patient like information on creating a medical advance directive?     No - Patient declined No - Patient declined     Current Medications (verified) Outpatient Encounter Medications as of 02/04/2022  Medication Sig   acetaminophen (TYLENOL) 325 MG tablet Take 650 mg by mouth every 6 (six) hours as needed. Takes at bedtime and occasionally in am for back/hip pain   atorvastatin (LIPITOR) 20 MG tablet TAKE 1 TABLET BY MOUTH  DAILY AT 6 PM.   B Complex-C-Folic Acid (SUPER B COMPLEX/FA/VIT C PO) Take 1 tablet by mouth daily. Contains 4573mcg folic acid   carvedilol (COREG) 6.25 MG tablet Take  1 tablet (6.25 mg total) by mouth 2 (two) times daily.   cholecalciferol (VITAMIN D3) 25 MCG (1000 UNIT) tablet Take 2,000 Units by mouth daily.    DULoxetine (CYMBALTA) 60 MG capsule Take 1 capsule (60 mg total) by mouth daily.   finasteride (PROSCAR) 5 MG tablet Take 1 tablet (5 mg total) by mouth daily.   folic acid (FOLVITE) 1 MG tablet Take 1 tablet (1 mg total) by mouth daily.   gabapentin (NEURONTIN) 600 MG tablet TAKE 1 TABLET BY MOUTH 3  TIMES DAILY   lidocaine (LMX) 4 % cream Apply 1 application topically as needed.   metFORMIN (GLUCOPHAGE) 500 MG tablet TAKE 2 TABLETS BY MOUTH TWICE  DAILY   methotrexate (RHEUMATREX) 2.5 MG tablet Take 2.5 mg by mouth once a week. 6 tabs weekly   mometasone (ELOCON) 0.1 % cream APPLY  CREAM TOPICALLY ONCE DAILY   sitaGLIPtin (JANUVIA) 100 MG tablet Take 1 tablet (100 mg total) by mouth daily.   tamsulosin (FLOMAX) 0.4 MG CAPS capsule Take 1 capsule (0.4 mg total) by mouth daily.   vitamin C (ASCORBIC ACID) 500 MG tablet Take 500 mg by mouth daily.   No facility-administered encounter medications on file as of 02/04/2022.    Allergies (verified) Morphine and Morphine and related   History: Past Medical History:  Diagnosis Date   Adenocarcinoma, renal cell (HCC)    Arthritis    Collagen vascular  disease ([place])    Diabetes mellitus without complication ([place])    Elevated PSA    Headache    Hematuria    Hyperlipidemia    Hypertension    Neuropathy    Obesity    PONV (postoperative nausea and vomiting)    Renal insufficiency    Right renal mass    Past Surgical History:  Procedure Laterality Date   APPENDECTOMY     CRYOABLATION  10/17/2017   IR RADIOLOGIST EVAL & MGMT  07/17/2017   IR RADIOLOGIST EVAL & MGMT  08/13/2017   IR RADIOLOGIST EVAL & MGMT  11/12/2017   IR RADIOLOGIST EVAL & MGMT  03/05/2018   RADIOLOGY WITH ANESTHESIA Right 10/17/2017   Procedure: CT WITH ANESTHESIA RENAL CRYOABLATION;  Surgeon: [name], MD;  Location:  [facility];  Service: Anesthesiology;  Laterality: Right;   ROBOTIC ASSITED PARTIAL NEPHRECTOMY Right 12/12/2015   Procedure: ROBOTIC ASSITED PARTIAL NEPHRECTOMY;  Surgeon: [name], MD;  Location: [facility] OR[facility];  Service: Urology;  Laterality: Right;   Family History  Problem Relation Age of Onset   Bone cancer Mother    Cancer Mother    Cancer Father    Stroke Brother    Kidney disease Neg Hx    Social History   Socioeconomic History   Marital status: Married    Spouse name: Not on file   Number of children: Not on file   Years of education: 3 years college    Highest education level: Some college, no degree  Occupational History   Not on file  Tobacco Use   Smoking status: Never   Smokeless tobacco: Never  Vaping Use   Vaping Use: Never used  Substance and Sexual Activity   Alcohol use: No   Drug use: No   Sexual activity: Not Currently  Other Topics Concern   Not on file  Social History Narrative   Not on file   Social Determinants of Health   Financial Resource Strain: Low Risk  (02/04/2022)   Overall Financial Resource Strain (CARDIA)    Difficulty of Paying Living Expenses: Not hard at all  Food Insecurity: No Food Insecurity (02/04/2022)   Hunger Vital Sign    Worried About Running Out of Food in the Last Year: Never true    [place] in the Last Year: Never true  Transportation Needs: No Transportation Needs (02/04/2022)   PRAPARE - Hydrologist (Medical): No    Lack of Transportation (Non-Medical): No  Physical Activity: Inactive (02/04/2022)   Exercise Vital Sign    Days of Exercise per Week: 0 days    Minutes of Exercise per Session: 0 min  Stress: No Stress Concern Present (02/04/2022)   [place]    Feeling of Stress : Not at all  Social Connections: Moderately Integrated (02/04/2022)   Social Connection and Isolation Panel [NHANES]    Frequency of  Communication with Friends and Family: More than three times a week    Frequency of Social Gatherings with Friends and Family: Twice a week    Attends Religious Services: More than 4 times per year    Active Member of [employer] or Organizations: No    Attends Archivist Meetings: Never    Marital Status: Married    Tobacco Counseling Counseling given: Not Answered   Clinical Intake:  Pre-visit preparation completed: Yes  Pain : No/denies pain     Diabetes: Yes  CBG done?: No Did pt. bring in CBG monitor from home?: No  How often do you need to have someone help you when you read instructions, pamphlets, or other written materials from your doctor or pharmacy?: 1 - Never  Diabetic?yes  Nutrition Risk Assessment:  Has the patient had any N/V/D within the last 2 months?  No  Does the patient have any non-healing wounds?  No  Has the patient had any unintentional weight loss or weight gain?  No   Diabetes:  Is the patient diabetic?  Yes  If diabetic, was a CBG obtained today?  No  Did the patient bring in their glucometer from home?  No  How often do you monitor your CBG's?   Every 3 months at doctors.   Financial Strains and Diabetes Management:  Are you having any financial strains with the device, your supplies or your medication? No .  Does the patient want to be seen by Chronic Care Management for management of their diabetes?  No  Would the patient like to be referred to a Nutritionist or for Diabetic Management?  No   Diabetic Exams:  Diabetic Eye Exam:.Pt has been advised about the importance in completing this exam.  Diabetic Foot Exam: Pt has been advised about the importance in completing this exam. .    Interpreter Needed?: No  Information entered by :: Leroy Kennedy LPN   Activities of Daily Living    02/04/2022   12:00 PM 06/04/2021   12:05 PM  In your present state of health, do you have any difficulty performing the following activities:   Hearing? 0 0  Vision? 0 0  Difficulty concentrating or making decisions? 0 0  Walking or climbing stairs? 0 0  Dressing or bathing? 0 0  Doing errands, shopping? 0 0  Preparing Food and eating ? N   Using the Toilet? N   In the past six months, have you accidently leaked urine? N   Do you have problems with loss of bowel control? N   Managing your Medications? N   Managing your Finances? N   Housekeeping or managing your Housekeeping? N     Patient Care Team: Venita Lick, NP as PCP - General (Nurse Practitioner) [name], MD as Consulting Physician (Urology) [name], MD as Consulting Physician (Interventional Radiology) Vanita Ingles, RN as Case Manager ([place]) Vladimir Faster, [facility] (Inactive) as Pharmacist (Pharmacist) Lane Hacker, [facility] (Pharmacist)  Indicate any recent Medical Services you may have received from other than [facility] providers in the past year (date may be approximate).     Assessment:   This is a routine wellness examination for [facility].  Hearing/Vision screen Hearing Screening - Comments:: No trouble hearing Vision Screening - Comments:: Up to date [facility]  Dietary issues and exercise activities discussed: Current Exercise Habits: The patient does not participate in regular exercise at present   Goals Addressed             This Visit's Progress    Patient Stated   On track    01/31/2020, staying alive     Patient Stated   On track    02/02/2021, stay alive     Patient Stated       Continue current lifestyle       Depression Screen    02/04/2022   11:48 AM 01/02/2022    2:27 PM 12/04/2021    2:30 PM 09/03/2021    2:04 PM 07/02/2021  1:21 PM 06/04/2021   11:16 AM 02/02/2021    2:57 PM  PHQ 2/9 Scores  PHQ - 2 Score 0 0 0 0 0 0 0  PHQ- 9 Score 0 0 0 0 0 0     Fall Risk    01/02/2022    2:27 PM 12/04/2021    2:30 PM 09/03/2021    2:04 PM 07/02/2021    1:21 PM 06/04/2021   12:05 PM  [place] in  the past year? 0 0 0 0 0  Number falls in past yr: 0 0 0 0 0  Injury with Fall? 0 0 0 0 0  Risk for fall due to : No Fall Risks No Fall Risks No Fall Risks No Fall Risks No Fall Risks  Follow up Falls evaluation completed Falls evaluation completed Falls evaluation completed Falls evaluation completed Falls evaluation completed    FALL RISK PREVENTION PERTAINING TO THE HOME:  Any stairs in or around the home? No  If so, are there any without handrails? No  Home free of loose throw rugs in walkways, pet beds, electrical cords, etc? Yes  Adequate lighting in your home to reduce risk of falls? Yes   ASSISTIVE DEVICES UTILIZED TO PREVENT FALLS:  Life alert? No  Use of a cane, walker or w/c? No  Grab bars in the bathroom? Yes  Shower chair or bench in shower? Yes  Elevated toilet seat or a handicapped toilet? Yes   TIMED UP AND GO:  Was the test performed? No .   Cognitive Function:        02/04/2022   11:44 AM 02/02/2021    2:58 PM 01/31/2020    1:07 PM 01/08/2018    2:10 PM 01/01/2017    1:35 PM  6CIT Screen  What Year? 0 points 0 points 0 points 0 points 0 points  What month? 0 points 0 points 0 points 0 points 0 points  What time? 0 points 0 points 0 points 0 points 0 points  Count back from 20 0 points 0 points 0 points 0 points 0 points  Months in reverse 0 points 0 points 0 points 0 points 0 points  Repeat phrase 0 points 0 points 0 points 0 points 0 points  Total Score 0 points 0 points 0 points 0 points 0 points    Immunizations Immunization History  Administered Date(s) Administered   Fluad Quad(high Dose 65+) 01/25/2019, 03/13/2020, 02/27/2021   Influenza, High Dose Seasonal PF 03/25/2016, 01/13/2017, 03/12/2018   Influenza,inj,Quad PF,6+ Mos 03/13/2015   Influenza-Unspecified 04/18/2014   PFIZER(Purple Top)SARS-COV-2 Vaccination 05/12/2019, 06/05/2019, 02/25/2020, 11/28/2020   Pneumococcal Conjugate-13 10/19/2013   Pneumococcal Polysaccharide-23 09/03/1997,  02/26/2005, 10/10/2019, 10/11/2019   Td 02/17/2008, 09/03/2021    TDAP status: Up to date  Flu Vaccine status: Due, Education has been provided regarding the importance of this vaccine. Advised may receive this vaccine at local pharmacy or Health Dept. Aware to provide a copy of the vaccination record if obtained from local pharmacy or Health Dept. Verbalized acceptance and understanding.  Pneumococcal vaccine status: Up to date  Covid-19 vaccine status: Information provided on how to obtain vaccines.   Qualifies for Shingles Vaccine? Yes   Zostavax completed No   Shingrix Completed?: No.    Education has been provided regarding the importance of this vaccine. Patient has been advised to call insurance company to determine out of pocket expense if they have not yet received this vaccine. Advised may also receive vaccine  at local pharmacy or Health Dept. Verbalized acceptance and understanding.  Screening Tests Health Maintenance  Topic Date Due   COVID-19 Vaccine (5 - Pfizer risk series) 01/23/2021   INFLUENZA VACCINE  12/04/2021   Zoster Vaccines- Shingrix (1 of 2) 05/07/2022 (Originally 02/27/1960)   OPHTHALMOLOGY EXAM  04/27/2022   Diabetic kidney evaluation - Urine ACR  06/04/2022   FOOT EXAM  06/04/2022   HEMOGLOBIN A1C  06/06/2022   Diabetic kidney evaluation - GFR measurement  09/04/2022   TETANUS/TDAP  09/04/2031   Pneumonia Vaccine 67+ Years old  Completed   Hepatitis C Screening  Completed   HPV VACCINES  Aged Out   COLONOSCOPY (Pts 45-35yr Insurance coverage will need to be confirmed)  Discontinued    Health Maintenance  Health Maintenance Due  Topic Date Due   COVID-19 Vaccine (5 - Pfizer risk series) 01/23/2021   INFLUENZA VACCINE  12/04/2021    Colorectal cancer screening: No longer required.   Lung Cancer Screening: (Low Dose CT Chest recommended if Age 79-80years, 30 pack-year currently smoking OR have quit w/in 15years.) does not qualify.   Lung Cancer  Screening Referral:   Additional Screening:  Hepatitis C Screening: does not qualify; Completed 2021  Vision Screening: Recommended annual ophthalmology exams for early detection of glaucoma and other disorders of the eye. Is the patient up to date with their annual eye exam?  Yes  Who is the provider or what is the name of the office in which the patient attends annual eye exams? [facility] If pt is not established with a provider, would they like to be referred to a provider to establish care? No .   Dental Screening: Recommended annual dental exams for proper oral hygiene  Community Resource Referral / Chronic Care Management: CRR required this visit?  No   CCM required this visit?  No      Plan:     I have personally reviewed and noted the following in the patient's chart:   Medical and social history Use of alcohol, tobacco or illicit drugs  Current medications and supplements including opioid prescriptions. Patient is not currently taking opioid prescriptions. Functional ability and status Nutritional status Physical activity Advanced directives List of other physicians Hospitalizations, surgeries, and ER visits in previous 12 months Vitals Screenings to include cognitive, depression, and falls Referrals and appointments  In addition, I have reviewed and discussed with patient certain preventive protocols, quality metrics, and best practice recommendations. A written personalized care plan for preventive services as well as general preventive health recommendations were provided to patient.     JLeroy Kennedy LPN   [id number]  Nurse Notes:

## 2022-03-04 NOTE — Patient Instructions (Signed)
Diabetes Mellitus Basics  Diabetes mellitus, or diabetes, is a long-term (chronic) disease. It occurs when the body does not properly use sugar (glucose) that is released from food after you eat. Diabetes mellitus may be caused by one or both of these problems: Your pancreas does not make enough of a hormone called insulin. Your body does not react in a normal way to the insulin that it makes. Insulin lets glucose enter cells in your body. This gives you energy. If you have diabetes, glucose cannot get into cells. This causes high blood glucose (hyperglycemia). How to treat and manage diabetes You may need to take insulin or other diabetes medicines daily to keep your glucose in balance. If you are prescribed insulin, you will learn how to give yourself insulin by injection. You may need to adjust the amount of insulin you take based on the foods that you eat. You will need to check your blood glucose levels using a glucose monitor as told by your health care provider. The readings can help determine if you have low or high blood glucose. Generally, you should have these blood glucose levels: Before meals (preprandial): 80-130 mg/dL (4.4-7.2 mmol/L). After meals (postprandial): below 180 mg/dL (10 mmol/L). Hemoglobin A1c (HbA1c) level: less than 7%. Your health care provider will set treatment goals for you. Keep all follow-up visits. This is important. Follow these instructions at home: Diabetes medicines Take your diabetes medicines every day as told by your health care provider. List your diabetes medicines here: Name of medicine: ______________________________ Amount (dose): _______________ Time (a.m./p.m.): _______________ Notes: ___________________________________ Name of medicine: ______________________________ Amount (dose): _______________ Time (a.m./p.m.): _______________ Notes: ___________________________________ Name of medicine: ______________________________ Amount (dose):  _______________ Time (a.m./p.m.): _______________ Notes: ___________________________________ Insulin If you use insulin, list the types of insulin you use here: Insulin type: ______________________________ Amount (dose): _______________ Time (a.m./p.m.): _______________Notes: ___________________________________ Insulin type: ______________________________ Amount (dose): _______________ Time (a.m./p.m.): _______________ Notes: ___________________________________ Insulin type: ______________________________ Amount (dose): _______________ Time (a.m./p.m.): _______________ Notes: ___________________________________ Insulin type: ______________________________ Amount (dose): _______________ Time (a.m./p.m.): _______________ Notes: ___________________________________ Insulin type: ______________________________ Amount (dose): _______________ Time (a.m./p.m.): _______________ Notes: ___________________________________ Managing blood glucose  Check your blood glucose levels using a glucose monitor as told by your health care provider. Write down the times that you check your glucose levels here: Time: _______________ Notes: ___________________________________ Time: _______________ Notes: ___________________________________ Time: _______________ Notes: ___________________________________ Time: _______________ Notes: ___________________________________ Time: _______________ Notes: ___________________________________ Time: _______________ Notes: ___________________________________  Low blood glucose Low blood glucose (hypoglycemia) is when glucose is at or below 70 mg/dL (3.9 mmol/L). Symptoms may include: Feeling: Hungry. Sweaty and clammy. Irritable or easily upset. Dizzy. Sleepy. Having: A fast heartbeat. A headache. A change in your vision. Numbness around the mouth, lips, or tongue. Having trouble with: Moving (coordination). Sleeping. Treating low blood glucose To treat low blood  glucose, eat or drink something containing sugar right away. If you can think clearly and swallow safely, follow the 15:15 rule: Take 15 grams of a fast-acting carb (carbohydrate), as told by your health care provider. Some fast-acting carbs are: Glucose tablets: take 3-4 tablets. Hard candy: eat 3-5 pieces. Fruit juice: drink 4 oz (120 mL). Regular (not diet) soda: drink 4-6 oz (120-180 mL). Honey or sugar: eat 1 Tbsp (15 mL). Check your blood glucose levels 15 minutes after you take the carb. If your glucose is still at or below 70 mg/dL (3.9 mmol/L), take 15 grams of a carb again. If your glucose does not go above 70 mg/dL (3.9 mmol/L) after   3 tries, get help right away. After your glucose goes back to normal, eat a meal or a snack within 1 hour. Treating very low blood glucose If your glucose is at or below 54 mg/dL (3 mmol/L), you have very low blood glucose (severe hypoglycemia). This is an emergency. Do not wait to see if the symptoms will go away. Get medical help right away. Call your local emergency services (911 in the [place] Do not drive yourself to the hospital. Questions to ask your health care provider Should I talk with a diabetes educator? What equipment will I need to care for myself at home? What diabetes medicines do I need? When should I take them? How often do I need to check my blood glucose levels? What number can I call if I have questions? When is my follow-up visit? Where can I find a support group for people with diabetes? Where to find more information American Diabetes Association: www.diabetes.org Association of Diabetes Care and Education Specialists: www.diabeteseducator.org Contact a health care provider if: Your blood glucose is at or above 240 mg/dL (13.3 mmol/L) for 2 days in a row. You have been sick or have had a fever for 2 days or more, and you are not getting better. You have any of these problems for more than 6 hours: You cannot eat or  drink. You feel nauseous. You vomit. You have diarrhea. Get help right away if: Your blood glucose is lower than 54 mg/dL (3 mmol/L). You get confused. You have trouble thinking clearly. You have trouble breathing. These symptoms may represent a serious problem that is an emergency. Do not wait to see if the symptoms will go away. Get medical help right away. Call your local emergency services (911 in the [place] Do not drive yourself to the hospital. Summary Diabetes mellitus is a chronic disease that occurs when the body does not properly use sugar (glucose) that is released from food after you eat. Take insulin and diabetes medicines as told. Check your blood glucose every day, as often as told. Keep all follow-up visits. This is important. This information is not intended to replace advice given to you by your health care provider. Make sure you discuss any questions you have with your health care provider. Document Revised: 08/24/2019 Document Reviewed: 08/24/2019 Elsevier Patient Education  2023 Elsevier Inc.  

## 2022-03-06 ENCOUNTER — Ambulatory Visit (INDEPENDENT_AMBULATORY_CARE_PROVIDER_SITE_OTHER): Payer: Medicare Other | Admitting: Nurse Practitioner

## 2022-03-06 ENCOUNTER — Encounter: Payer: Self-pay | Admitting: Nurse Practitioner

## 2022-03-06 VITALS — BP 139/78 | HR 69 | Temp 98.3°F | Ht 68.0 in | Wt 201.4 lb

## 2022-03-06 DIAGNOSIS — E1159 Type 2 diabetes mellitus with other circulatory complications: Secondary | ICD-10-CM | POA: Diagnosis not present

## 2022-03-06 DIAGNOSIS — M0609 Rheumatoid arthritis without rheumatoid factor, multiple sites: Secondary | ICD-10-CM

## 2022-03-06 DIAGNOSIS — N401 Enlarged prostate with lower urinary tract symptoms: Secondary | ICD-10-CM

## 2022-03-06 DIAGNOSIS — E0821 Diabetes mellitus due to underlying condition with diabetic nephropathy: Secondary | ICD-10-CM

## 2022-03-06 DIAGNOSIS — Z85528 Personal history of other malignant neoplasm of kidney: Secondary | ICD-10-CM

## 2022-03-06 DIAGNOSIS — R972 Elevated prostate specific antigen [PSA]: Secondary | ICD-10-CM

## 2022-03-06 DIAGNOSIS — E1169 Type 2 diabetes mellitus with other specified complication: Secondary | ICD-10-CM

## 2022-03-06 DIAGNOSIS — N138 Other obstructive and reflux uropathy: Secondary | ICD-10-CM | POA: Diagnosis not present

## 2022-03-06 DIAGNOSIS — I152 Hypertension secondary to endocrine disorders: Secondary | ICD-10-CM

## 2022-03-06 DIAGNOSIS — Z23 Encounter for immunization: Secondary | ICD-10-CM

## 2022-03-06 DIAGNOSIS — E785 Hyperlipidemia, unspecified: Secondary | ICD-10-CM

## 2022-03-06 DIAGNOSIS — D692 Other nonthrombocytopenic purpura: Secondary | ICD-10-CM | POA: Diagnosis not present

## 2022-03-06 LAB — BAYER DCA HB A1C WAIVED: HB A1C (BAYER DCA - WAIVED): 7.8 % — ABNORMAL HIGH (ref 4.8–5.6)

## 2022-03-06 MED ORDER — DAPAGLIFLOZIN PROPANEDIOL 10 MG PO TABS: 10.0000 mg | ORAL_TABLET | Freq: Every day | ORAL | 4 refills | Status: DC

## 2022-03-06 NOTE — Assessment & Plan Note (Signed)
Chronic, ongoing.  Continue current medication regimen and adjust as needed. Lipid panel today. 

## 2022-03-06 NOTE — Assessment & Plan Note (Signed)
Continue collaboration with urology.  Recent note reviewed.  PSA monitored by them.

## 2022-03-06 NOTE — Assessment & Plan Note (Signed)
Chronic, ongoing.  Followed by rheumatology with improved functional status since initial visit with patient years ago.  Continue this collaboration and current regimen as prescribed by them.  Recent note reviewed.  Discussed that he can take occasional Tylenol as needed for back pain, max dosing 3000 MG daily.

## 2022-03-06 NOTE — Assessment & Plan Note (Signed)
Chronic, stable.  BP at goal for age in office today, avoid hypotension due to age.  Recommend he monitor BP at home at least three mornings a week + focus on DASH diet.  Continue current medication regimen and adjust as needed.  Labs: A1c and BMP.  Urine ALB 15 May 2021.  May benefit addition of ACE or ARB in future and transition off BB.

## 2022-03-06 NOTE — Assessment & Plan Note (Addendum)
Followed by urology, continue this collaboration.  Recent notes reviewed.

## 2022-03-06 NOTE — Assessment & Plan Note (Signed)
Chronic, stable, noted on exams.  Recommend continue to perform gentle skin care and monitor for skin breakdown, if present notify provider.

## 2022-03-06 NOTE — Progress Notes (Signed)
BP 139/78 (BP Location: Left Arm, Cuff Size: Normal)   Pulse 69   Temp 98.3 F (36.8 C) (Oral)   Ht '5\' 8"'$  (1.727 m)   Wt 201 lb 6.4 oz (91.4 kg)   SpO2 97%   BMI 30.62 kg/m    Subjective:    Patient ID: Jason Contreras, male    DOB: [birth year], 81 y.o.   MRN: [id number]  HPI: Jason Contreras is a 81 y.o. male  Chief Complaint  Patient presents with   Diabetes   Hypertension   Hyperlipidemia   Rheumatoid Arthritis   DIABETES A1c in August was 7.3%.  Taking Metformin 1000 MG BID and Januvia 100 MG daily.  Discontinued Actos and Amaryl over past years due to age and heart health. Tried Trulicity which caused GI upset and changed to Januvia which is tolerating.  Does endorse ongoing dietary indiscretions. Hypoglycemic episodes:no Polydipsia/polyuria: no Visual disturbance: no Chest pain: no Paresthesias: no Glucose Monitoring: no  Accucheck frequency: Not Checking  Fasting glucose:  Post prandial:  Evening:  Before meals: Taking Insulin?: no  Long acting insulin:  Short acting insulin: Blood Pressure Monitoring: rarely Retinal Examination: Up to Date Foot Exam: Up to Date Pneumovax: Up to Date Influenza: Up to Date Aspirin: no   HYPERTENSION / HYPERLIPIDEMIA Taking Coreg 6.25 MG BID + Atorvastatin 20 MG daily.  Satisfied with current treatment? yes Duration of hypertension: chronic BP monitoring frequency: rarely BP range:  BP medication side effects: no Duration of hyperlipidemia: chronic Cholesterol medication side effects: no Cholesterol supplements: none Medication compliance: good compliance Aspirin: no Recent stressors: no Recurrent headaches: no Visual changes: no Palpitations: no Dyspnea: no Chest pain: no Lower extremity edema: no Dizzy/lightheaded: no The ASCVD Risk score (Arnett DK, et al., 2019) failed to calculate for the following reasons:   The valid total cholesterol range is 130 to 320 mg/dL     RHEUMATOID ARTHRITIS Followed by  rheumatology for RA of multiple sites with negative Rf, last visit 11/27/21. Taking Methotrexate and Duloxetine + Folic Acid.  Has occasional joint pains, but overall has improved significantly over past few years. Pain control status: stable Duration: chronic Location: multiple areas What Activities task can be accomplished with current medication? ADL's daily Previous pain specialty evaluation: yes Non-narcotic analgesic meds: yes  BPH & PROSTATE CANCER HISTORY Follows with urology, last visit 09/11/21.  Continues Flomax and Finasteride. BPH status: stable Satisfied with current treatment?: yes Medication side effects: no Medication compliance: good compliance Duration: chronic Nocturia: no Urinary frequency:no Incomplete voiding: no Urgency: no Weak urinary stream: no Straining to start stream: no  Relevant past medical, surgical, family and social history reviewed and updated as indicated. Interim medical history since our last visit reviewed. Allergies and medications reviewed and updated.  Review of Systems  Constitutional:  Negative for activity change, diaphoresis, fatigue and fever.  Respiratory:  Negative for cough, chest tightness, shortness of breath and wheezing.   Cardiovascular:  Negative for chest pain, palpitations and leg swelling.  Gastrointestinal: Negative.   Endocrine: Negative for polydipsia, polyphagia and polyuria.  Neurological:  Negative for dizziness, syncope, weakness and numbness.  Psychiatric/Behavioral: Negative.     Per HPI unless specifically indicated above     Objective:    BP 139/78 (BP Location: Left Arm, Cuff Size: Normal)   Pulse 69   Temp 98.3 F (36.8 C) (Oral)   Ht '5\' 8"'$  (1.727 m)   Wt 201 lb 6.4 oz (91.4 kg)   SpO2 97%  BMI 30.62 kg/m   Wt Readings from Last 3 Encounters:  03/06/22 201 lb 6.4 oz (91.4 kg)  01/02/22 203 lb 8 oz (92.3 kg)  12/04/21 203 lb (92.1 kg)    Physical Exam Vitals and nursing note reviewed.   Constitutional:      General: He is awake. He is not in acute distress.    Appearance: He is well-developed and well-groomed. He is obese. He is not ill-appearing or toxic-appearing.  HENT:     Head: Normocephalic and atraumatic.     Right Ear: Hearing normal. No drainage.     Left Ear: Hearing normal. No drainage.  Eyes:     General: Lids are normal.        Right eye: No discharge.        Left eye: No discharge.     Conjunctiva/sclera: Conjunctivae normal.     Pupils: Pupils are equal, round, and reactive to light.  Neck:     Thyroid: No thyromegaly.     Vascular: No carotid bruit or JVD.     Trachea: Trachea normal.  Cardiovascular:     Rate and Rhythm: Normal rate and regular rhythm.     Heart sounds: Normal heart sounds, S1 normal and S2 normal. No murmur heard.    No gallop.  Pulmonary:     Effort: Pulmonary effort is normal. No accessory muscle usage or respiratory distress.     Breath sounds: Normal breath sounds.  Abdominal:     General: Bowel sounds are normal.     Palpations: Abdomen is soft.  Musculoskeletal:        General: Normal range of motion.     Cervical back: Normal range of motion and neck supple.     Right lower leg: No edema.     Left lower leg: No edema.  Skin:    General: Skin is warm and dry.     Capillary Refill: Capillary refill takes less than 2 seconds.     Comments: Scattered pale purple/yellow bruises noted to BUE.  Neurological:     Mental Status: He is alert and oriented to person, place, and time.     Coordination: Coordination is intact.     Gait: Gait is intact.     Deep Tendon Reflexes: Reflexes are normal and symmetric.     Reflex Scores:      Brachioradialis reflexes are 2+ on the right side and 2+ on the left side.      Patellar reflexes are 2+ on the right side and 2+ on the left side. Psychiatric:        Mood and Affect: Mood normal.        Behavior: Behavior normal. Behavior is cooperative.        Thought Content: Thought  content normal.        Judgment: Judgment normal.    Results for orders placed or performed in visit on 03/06/22  Bayer DCA Hb A1c Waived  Result Value Ref Range   HB A1C (BAYER DCA - WAIVED) 7.8 (H) 4.8 - 5.6 %      Assessment & Plan:   Problem List Items Addressed This Visit       Cardiovascular and Mediastinum   Hypertension associated with diabetes ([place])    Chronic, stable.  BP at goal for age in office today, avoid hypotension due to age.  Recommend he monitor BP at home at least three mornings a week + focus on DASH diet.  Continue current medication  regimen and adjust as needed.  Labs: A1c and BMP.  Urine ALB 15 May 2021.  May benefit addition of ACE or ARB in future and transition off BB.      Relevant Medications   dapagliflozin propanediol (FARXIGA) 10 MG TABS tablet   Other Relevant Orders   Bayer DCA Hb A1c Waived (Completed)   Basic metabolic panel   Senile purpura (HCC)    Chronic, stable, noted on exams.  Recommend continue to perform gentle skin care and monitor for skin breakdown, if present notify provider.        Endocrine   DM due to underlying condition with diabetic nephropathy ([place]) - Primary    Chronic, ongoing in patient with history of renal cell CA.  A1c today trend up to 7.8%, from 7.3% due to dietary indiscretions.  Did not tolerate Trulicity, GI issues with this.   - Continue Metformin 1000 MG BID and Januvia 100 MG daily, educated him on these.  Start Farxiga 10 MG daily by mouth, educated on this and side effects.  Recommend he ensure to drink plenty of water while taking this.  Alert provider if cost assistance needed.  Has BPH well controlled, monitor urinary symptoms with SGLT2. - Monitor BS daily in morning at home.  - Eye and foot exam up to date - Due to his age and risk for hypoglycemia would avoid return to Glipizide. Return to office 4 weeks.      Relevant Medications   dapagliflozin propanediol (FARXIGA) 10 MG TABS tablet   Other  Relevant Orders   Bayer DCA Hb A1c Waived (Completed)   Basic metabolic panel   Hyperlipidemia associated with type 2 diabetes mellitus (HCC)    Chronic, ongoing.  Continue current medication regimen and adjust as needed.  Lipid panel today.      Relevant Medications   dapagliflozin propanediol (FARXIGA) 10 MG TABS tablet   Other Relevant Orders   Bayer DCA Hb A1c Waived (Completed)   Lipid Panel w/o Chol/HDL Ratio     Musculoskeletal and Integument   Rheumatoid arthritis of multiple sites with negative rheumatoid factor (HCC)    Chronic, ongoing.  Followed by rheumatology with improved functional status since initial visit with patient years ago.  Continue this collaboration and current regimen as prescribed by them.  Recent note reviewed.  Discussed that he can take occasional Tylenol as needed for back pain, max dosing 3000 MG daily.      Relevant Orders   Basic metabolic panel     Genitourinary   BPH with obstruction/lower urinary tract symptoms    Chronic, ongoing.  Continue current medication regimen and collaboration with urology.  Recent note reviewed.  Monitor symptoms with addition of SGLT2 for diabetes.        Other   Elevated PSA    Continue collaboration with urology.  Recent note reviewed.  PSA monitored by them.        Personal history of renal cancer    Followed by urology, continue this collaboration.  Recent notes reviewed.      Other Visit Diagnoses     Flu vaccine need       Flu vaccine in office today   Relevant Orders   Flu Vaccine QUAD High Dose(Fluad) (Completed)        Follow up plan: Return in about 1 month (around 04/08/2022) for T2DM -- farxiga added to this.

## 2022-03-06 NOTE — Assessment & Plan Note (Signed)
Chronic, ongoing in patient with history of renal cell CA.  A1c today trend up to 7.8%, from 7.3% due to dietary indiscretions.  Did not tolerate Trulicity, GI issues with this.   - Continue Metformin 1000 MG BID and Januvia 100 MG daily, educated him on these.  Start Farxiga 10 MG daily by mouth, educated on this and side effects.  Recommend he ensure to drink plenty of water while taking this.  Alert provider if cost assistance needed.  Has BPH well controlled, monitor urinary symptoms with SGLT2. - Monitor BS daily in morning at home.  - Eye and foot exam up to date - Due to his age and risk for hypoglycemia would avoid return to Glipizide. Return to office 4 weeks.

## 2022-03-06 NOTE — Assessment & Plan Note (Signed)
Chronic, ongoing.  Continue current medication regimen and collaboration with urology.  Recent note reviewed.  Monitor symptoms with addition of SGLT2 for diabetes.

## 2022-03-07 LAB — LIPID PANEL W/O CHOL/HDL RATIO
Cholesterol, Total: 106 mg/dL (ref 100–199)
HDL: 38 mg/dL — ABNORMAL LOW (ref >39)
LDL Chol Calc (NIH): 47 mg/dL (ref 0–99)
Triglycerides: 118 mg/dL (ref 0–149)
VLDL Cholesterol Cal: 21 mg/dL (ref 5–40)

## 2022-03-07 LAB — BASIC METABOLIC PANEL
BUN/Creatinine Ratio: 13 (ref 10–24)
BUN: 11 mg/dL (ref 8–27)
CO2: 24 mmol/L (ref 20–29)
Calcium: 8.9 mg/dL (ref 8.6–10.2)
Chloride: 104 mmol/L (ref 96–106)
Creatinine, Ser: 0.85 mg/dL (ref 0.76–1.27)
Glucose: 203 mg/dL — ABNORMAL HIGH (ref 70–99)
Potassium: 4.5 mmol/L (ref 3.5–5.2)
Sodium: 142 mmol/L (ref 134–144)
eGFR: 88 mL/min/{1.73_m2} (ref >59)

## 2022-03-07 NOTE — Progress Notes (Signed)
Contacted via [place] afternoon Jason Contreras, your labs have returned: - Kidney function, creatinine and eGFR, remains normal, as is liver function, AST and ALT. Sugar level, glucose, is elevated as expected. - Cholesterol levels show LDL at goal for prevention of heart attack or stroke.  Great news!!  Any questions? Keep being amazing!!  Thank you for allowing me to participate in your care.  I appreciate you. Kindest regards, Annakate Soulier

## 2022-03-13 ENCOUNTER — Encounter: Payer: Self-pay | Admitting: *Deleted

## 2022-03-13 NOTE — Patient Outreach (Signed)
  Care Coordination   Follow Up Visit Note   03/13/2022 Name: Jason Contreras MRN: [id number] DOB: 1940/09/30  Jason Contreras is a 81 y.o. year old male who sees [place], Henrine Screws T, NP for primary care. I spoke with  Jason Contreras by phone today.  What matters to the patients health and wellness today?  Ongoing effective management of DM.    Goals Addressed             This Visit's Progress    RNCM: Effective Management of DM   On track    Care Coordination Interventions: Lab Results  Component Value Date   HGBA1C 7.8 (H) 03/06/2022    Provided education to patient about basic DM disease process Reviewed medications with patient and discussed importance of medication adherence.  Discussed addition of Farxiga to regime Counseled on importance of regular laboratory monitoring as prescribed Discussed plans with patient for ongoing care management follow up and provided patient with direct contact information for care management team Provided patient with written educational materials related to hypo and hyperglycemia and importance of correct treatment Reviewed scheduled/upcoming provider appointments including: 04-08-2022  Advised patient, providing education and rationale, to check cbg as directed  and record, calling pcp for findings outside established parameters. The patient is not checking his blood sugars. Review of the benefits of checking blood sugars and the patient agrees to start checking periodically. Education and support given.  Review of patient status, including review of consultants reports, relevant laboratory and other test results, and medications completed Discussed change in diet to increase vegetables and decrease carbs. Reviewed decrease in daily blood sugar readings from 230s down to 170s  Active listening / Reflection utilized  Emotional Support Provided         SDOH assessments and interventions completed:  No     Care Coordination Interventions  Activated:  Yes  Care Coordination Interventions:  Yes, provided   Follow up plan: Follow up call scheduled for 12/8    Encounter Outcome:  Pt. Visit Completed   Valente David, RN, MSN, [facility] Care Management Coordinator [phone]

## 2022-03-13 NOTE — Patient Instructions (Signed)
Visit Information  Thank you for taking time to visit with me today. Please Dmitriy't hesitate to contact me if I can be of assistance to you before our next scheduled telephone appointment.  Following are the goals we discussed today:  Take Farxiga as instructed. Continue to decrease sugar/carbohydrate intake and increase water.  Monitor blood sugars daily.  Our next appointment is by telephone on 12/8  Please call the care guide team at [phone] if you need to cancel or reschedule your appointment.   Please call the Suicide and Crisis Lifeline: 988 call the [place] National Suicide Prevention Lifeline: (762)211-4334 or TTY: 365-420-9333 TTY (903)415-6391) to talk to a trained counselor call 1-800-273-TALK (toll free, 24 hour hotline) call 911 if you are experiencing a Mental Health or [place] or need someone to talk to.  Patient verbalizes understanding of instructions and care plan provided today and agrees to view in [facility]. Active MyChart status and patient understanding of how to access instructions and care plan via MyChart confirmed with patient.     The patient has been provided with contact information for the care management team and has been advised to call with any health related questions or concerns.   Valente David, RN, MSN, [facility] Care Management Care Management Coordinator [phone]

## 2022-03-26 ENCOUNTER — Ambulatory Visit: Payer: Self-pay

## 2022-03-26 NOTE — Telephone Encounter (Signed)
  Chief Complaint: abdominal pain  Symptoms: abdominal pain in lower abdomen, nausea, diarrhea, fatigue Frequency: ongoing since 03/09/22 when pt started [place].  Pertinent Negatives: Patient denies vomiting today Disposition: '[]'$ ED /'[]'$ Urgent Care (no appt availability in office) / '[x]'$ Appointment(In office/virtual)/ '[]'$  [facility]/ '[]'$ Home Care/ '[]'$ Refused Recommended Disposition /'[]'$ [facility]/ '[]'$  Follow-up with PCP Additional Notes: pt states unsure if related to medication or not but pt has had sx since starting [place]. Pt states had a bad experience Sunday while at [employer]. Got sick and vomiting and had EMS called, pt got better and was able to drive home. Pt states that today pain is there but manageable at home. Offered pt appt today, pt preferred appt tomorrow at 1340.   Reason for Disposition  [1] MODERATE pain (e.g., interferes with normal activities) AND [2] pain comes and goes (cramps) AND [3] present > 24 hours  (Exception: Pain with Vomiting or Diarrhea - see that Guideline.)  Answer Assessment - Initial Assessment Questions 1. LOCATION: "Where does it hurt?"      In the lower part of abdomen  3. ONSET: "When did the pain begin?" (Minutes, hours or days ago)      Ongoing since when started [place] on 03/09/22 5. PATTERN "Does the pain come and go, or is it constant?"    - If it comes and goes: "How long does it last?" "Do you have pain now?"     (Note: Comes and goes means the pain is intermittent. It goes away completely between bouts.)    - If constant: "Is it getting better, staying the same, or getting worse?"      (Note: Constant means the pain never goes away completely; most serious pain is constant and gets worse.)      Comes and goes  6. SEVERITY: "How bad is the pain?"  (e.g., Scale 1-10; mild, moderate, or severe)    - MILD (1-3): Doesn't interfere with normal activities, abdomen soft and not tender to touch.     - MODERATE (4-7): Interferes with  normal activities or awakens from sleep, abdomen tender to touch.     - SEVERE (8-10): Excruciating pain, doubled over, unable to do any normal activities.       Moderate  10. OTHER SYMPTOMS: "Do you have any other symptoms?" (e.g., back pain, diarrhea, fever, urination pain, vomiting)       Nausea, abdominal pain, fatigue  Protocols used: Abdominal Pain - Male-A-AH

## 2022-03-27 ENCOUNTER — Ambulatory Visit (INDEPENDENT_AMBULATORY_CARE_PROVIDER_SITE_OTHER): Payer: Medicare Other | Admitting: Nurse Practitioner

## 2022-03-27 ENCOUNTER — Encounter: Payer: Self-pay | Admitting: Nurse Practitioner

## 2022-03-27 VITALS — BP 129/76 | HR 71 | Temp 97.9°F | Ht 67.99 in | Wt 198.2 lb

## 2022-03-27 DIAGNOSIS — R1084 Generalized abdominal pain: Secondary | ICD-10-CM | POA: Diagnosis not present

## 2022-03-27 DIAGNOSIS — E0821 Diabetes mellitus due to underlying condition with diabetic nephropathy: Secondary | ICD-10-CM

## 2022-03-27 NOTE — Patient Instructions (Signed)
Abdominal Pain, Adult Many things can cause belly (abdominal) pain. Most times, belly pain is not dangerous. Many cases of belly pain can be watched and treated at home. Sometimes, though, belly pain is serious. Your doctor will try to find the cause of your belly pain. Follow these instructions at home:  Medicines Take over-the-counter and prescription medicines only as told by your doctor. Do not take medicines that help you poop (laxatives) unless told by your doctor. General instructions Watch your belly pain for any changes. Drink enough fluid to keep your pee (urine) pale yellow. Keep all follow-up visits as told by your doctor. This is important. Contact a doctor if: Your belly pain changes or gets worse. You are not hungry, or you lose weight without trying. You are having trouble pooping (constipated) or have watery poop (diarrhea) for more than 2-3 days. You have pain when you pee or poop. Your belly pain wakes you up at night. Your pain gets worse with meals, after eating, or with certain foods. You are vomiting and cannot keep anything down. You have a fever. You have blood in your pee. Get help right away if: Your pain does not go away as soon as your doctor says it should. You cannot stop vomiting. Your pain is only in areas of your belly, such as the right side or the left lower part of the belly. You have bloody or black poop, or poop that looks like tar. You have very bad pain, cramping, or bloating in your belly. You have signs of not having enough fluid or water in your body (dehydration), such as: Dark pee, very little pee, or no pee. Cracked lips. Dry mouth. Sunken eyes. Sleepiness. Weakness. You have trouble breathing or chest pain. Summary Many cases of belly pain can be watched and treated at home. Watch your belly pain for any changes. Take over-the-counter and prescription medicines only as told by your doctor. Contact a doctor if your belly pain  changes or gets worse. Get help right away if you have very bad pain, cramping, or bloating in your belly. This information is not intended to replace advice given to you by your health care provider. Make sure you discuss any questions you have with your health care provider. Document Revised: 08/31/2018 Document Reviewed: 08/31/2018 Elsevier Patient Education  2023 [employer]  

## 2022-03-27 NOTE — Progress Notes (Signed)
BP 129/76   Pulse 71   Temp 97.9 F (36.6 C) (Oral)   Ht 5' 7.99" (1.727 m)   Wt 198 lb 3.2 oz (89.9 kg)   SpO2 99%   BMI 30.14 kg/m    Subjective:    Patient ID: Jason Contreras, male    DOB: 01/18/1941, 81 y.o.   MRN: [id number]  HPI: Jason Contreras is a 81 y.o. male  Chief Complaint  Patient presents with   Medication Problem    Patient states that the Wilder Glade is giving his stomach pain   ABDOMINAL PAIN  Started having stomach pain after starting Farxiga.  Had a spell at [employer], they were going to call EMT it was so bad and then had little episode yesterday.  Monday felt very weak.  Having liquid stools since Sunday + occasional N&V x 1 episode on Sunday.  Pain is lower abdomen. Duration:weeks Onset: gradual Severity: 10/10 Quality: sharp, aching, and stabbing Location:  suprapubic". "lower abdominal quadrants  Episode duration:  Radiation: no Frequency: intermittent Alleviating factors: Pepto Bismol, Tylenol Aggravating factors: eating at times Status: fluctuating Treatments attempted: antacids Fever: no Nausea: yes Vomiting: yes Weight loss: no Decreased appetite: no Diarrhea: yes Constipation:yes Blood in stool: no Heartburn: no Jaundice: no Rash: no Dysuria/urinary frequency: no Hematuria: no History of sexually transmitted disease: no Recurrent NSAID use: no   Relevant past medical, surgical, family and social history reviewed and updated as indicated. Interim medical history since our last visit reviewed. Allergies and medications reviewed and updated.  Review of Systems  Constitutional:  Negative for activity change, diaphoresis, fatigue and fever.  Respiratory:  Negative for cough, chest tightness, shortness of breath and wheezing.   Cardiovascular:  Negative for chest pain, palpitations and leg swelling.  Gastrointestinal: Negative.   Endocrine: Negative for polydipsia, polyphagia and polyuria.  Neurological:  Negative for dizziness,  syncope, weakness and numbness.  Psychiatric/Behavioral: Negative.      Per HPI unless specifically indicated above     Objective:    BP 129/76   Pulse 71   Temp 97.9 F (36.6 C) (Oral)   Ht 5' 7.99" (1.727 m)   Wt 198 lb 3.2 oz (89.9 kg)   SpO2 99%   BMI 30.14 kg/m   Wt Readings from Last 3 Encounters:  03/27/22 198 lb 3.2 oz (89.9 kg)  03/06/22 201 lb 6.4 oz (91.4 kg)  01/02/22 203 lb 8 oz (92.3 kg)    Physical Exam Vitals and nursing note reviewed.  Constitutional:      General: He is awake. He is not in acute distress.    Appearance: He is well-developed and well-groomed. He is obese. He is not ill-appearing or toxic-appearing.  HENT:     Head: Normocephalic and atraumatic.     Right Ear: Hearing normal. No drainage.     Left Ear: Hearing normal. No drainage.  Eyes:     General: Lids are normal.        Right eye: No discharge.        Left eye: No discharge.     Conjunctiva/sclera: Conjunctivae normal.     Pupils: Pupils are equal, round, and reactive to light.  Neck:     Thyroid: No thyromegaly.     Vascular: No carotid bruit or JVD.     Trachea: Trachea normal.  Cardiovascular:     Rate and Rhythm: Normal rate and regular rhythm.     Heart sounds: Normal heart sounds, S1 normal and  S2 normal. No murmur heard.    No gallop.  Pulmonary:     Effort: Pulmonary effort is normal. No accessory muscle usage or respiratory distress.     Breath sounds: Normal breath sounds.  Abdominal:     General: Bowel sounds are normal. There is no distension.     Palpations: Abdomen is soft. There is no hepatomegaly.     Tenderness: There is no abdominal tenderness. There is no right CVA tenderness or left CVA tenderness.     Hernia: No hernia is present.  Musculoskeletal:        General: Normal range of motion.     Cervical back: Normal range of motion and neck supple.     Right lower leg: No edema.     Left lower leg: No edema.  Skin:    General: Skin is warm and dry.      Capillary Refill: Capillary refill takes less than 2 seconds.     Comments: Scattered pale purple/yellow bruises noted to BUE.  Neurological:     Mental Status: He is alert and oriented to person, place, and time.     Coordination: Coordination is intact.     Gait: Gait is intact.     Deep Tendon Reflexes: Reflexes are normal and symmetric.     Reflex Scores:      Brachioradialis reflexes are 2+ on the right side and 2+ on the left side.      Patellar reflexes are 2+ on the right side and 2+ on the left side. Psychiatric:        Mood and Affect: Mood normal.        Behavior: Behavior normal. Behavior is cooperative.        Thought Content: Thought content normal.        Judgment: Judgment normal.     Results for orders placed or performed in visit on 03/06/22  Bayer DCA Hb A1c Waived  Result Value Ref Range   HB A1C (BAYER DCA - WAIVED) 7.8 (H) 4.8 - 5.6 %  Basic metabolic panel  Result Value Ref Range   Glucose 203 (H) 70 - 99 mg/dL   BUN 11 8 - 27 mg/dL   Creatinine, Ser 0.85 0.76 - 1.27 mg/dL   eGFR 88 >59 mL/min/1.73   BUN/Creatinine Ratio 13 10 - 24   Sodium 142 134 - 144 mmol/L   Potassium 4.5 3.5 - 5.2 mmol/L   Chloride 104 96 - 106 mmol/L   CO2 24 20 - 29 mmol/L   Calcium 8.9 8.6 - 10.2 mg/dL  Lipid Panel w/o Chol/HDL Ratio  Result Value Ref Range   Cholesterol, Total 106 100 - 199 mg/dL   Triglycerides 118 0 - 149 mg/dL   HDL 38 (L) >39 mg/dL   VLDL Cholesterol Cal 21 5 - 40 mg/dL   LDL Chol Calc (NIH) 47 0 - 99 mg/dL      Assessment & Plan:   Problem List Items Addressed This Visit       Endocrine   DM due to underlying condition with diabetic nephropathy ([place]) - Primary    Chronic, ongoing in patient with history of renal cell CA.  A1c trend up to 7.8% recent visit, from 7.3% due to dietary indiscretions.  Did not tolerate Trulicity, GI issues with this.  Wilder Glade started, but not tolerating, will stop this for now and consider very low dose Glipizide next  visit -- wish to avoid insulin. - Continue Metformin 1000 MG BID  and Januvia 100 MG daily. Recommend he ensure to drink plenty of water while taking this.  Alert provider if cost assistance needed.   - Monitor BS daily in morning at home.  - Eye and foot exam up to date - Due to his age and risk for hypoglycemia would avoid return to Glipizide.       Relevant Orders   Lipase   Amylase     Other   Generalized abdominal pain    Acute, suspect related to [place].  Stop this.  Check labs today and adjust plan as needed based on findings.  Recommend he hydrate well at home, educated him on this.  Return in December as scheduled.      Relevant Orders   Comprehensive metabolic panel   Lipase   Amylase   CBC with Differential/Platelet   Urinalysis, Routine w reflex microscopic     Follow up plan: Return for as scheduled December 4th.

## 2022-03-27 NOTE — Assessment & Plan Note (Signed)
Acute, suspect related to [place].  Stop this.  Check labs today and adjust plan as needed based on findings.  Recommend he hydrate well at home, educated him on this.  Return in December as scheduled.

## 2022-03-27 NOTE — Assessment & Plan Note (Signed)
Chronic, ongoing in patient with history of renal cell CA.  A1c trend up to 7.8% recent visit, from 7.3% due to dietary indiscretions.  Did not tolerate Trulicity, GI issues with this.  Wilder Glade started, but not tolerating, will stop this for now and consider very low dose Glipizide next visit -- wish to avoid insulin. - Continue Metformin 1000 MG BID and Januvia 100 MG daily. Recommend he ensure to drink plenty of water while taking this.  Alert provider if cost assistance needed.   - Monitor BS daily in morning at home.  - Eye and foot exam up to date - Due to his age and risk for hypoglycemia would avoid return to Glipizide.

## 2022-03-28 LAB — COMPREHENSIVE METABOLIC PANEL
ALT: 12 IU/L (ref 0–44)
AST: 14 IU/L (ref 0–40)
Albumin/Globulin Ratio: 2 (ref 1.2–2.2)
Albumin: 4.2 g/dL (ref 3.8–4.8)
Alkaline Phosphatase: 82 IU/L (ref 44–121)
BUN/Creatinine Ratio: 17 (ref 10–24)
BUN: 16 mg/dL (ref 8–27)
Bilirubin Total: 0.6 mg/dL (ref 0.0–1.2)
CO2: 25 mmol/L (ref 20–29)
Calcium: 9.4 mg/dL (ref 8.6–10.2)
Chloride: 98 mmol/L (ref 96–106)
Creatinine, Ser: 0.95 mg/dL (ref 0.76–1.27)
Globulin, Total: 2.1 g/dL (ref 1.5–4.5)
Glucose: 168 mg/dL — ABNORMAL HIGH (ref 70–99)
Potassium: 4.5 mmol/L (ref 3.5–5.2)
Sodium: 138 mmol/L (ref 134–144)
Total Protein: 6.3 g/dL (ref 6.0–8.5)
eGFR: 81 mL/min/{1.73_m2} (ref >59)

## 2022-03-28 LAB — CBC WITH DIFFERENTIAL/PLATELET
Basophils Absolute: 0 10*3/uL (ref 0.0–0.2)
Basos: 0 %
EOS (ABSOLUTE): 0.1 10*3/uL (ref 0.0–0.4)
Eos: 2 %
Hematocrit: 41.7 % (ref 37.5–51.0)
Hemoglobin: 14.6 g/dL (ref 13.0–17.7)
Immature Grans (Abs): 0 10*3/uL (ref 0.0–0.1)
Immature Granulocytes: 0 %
Lymphocytes Absolute: 1.6 10*3/uL (ref 0.7–3.1)
Lymphs: 21 %
MCH: 33.6 pg — ABNORMAL HIGH (ref 26.6–33.0)
MCHC: 35 g/dL (ref 31.5–35.7)
MCV: 96 fL (ref 79–97)
Monocytes Absolute: 0.6 10*3/uL (ref 0.1–0.9)
Monocytes: 8 %
Neutrophils Absolute: 5.2 10*3/uL (ref 1.4–7.0)
Neutrophils: 69 %
Platelets: 289 10*3/uL (ref 150–450)
RBC: 4.35 x10E6/uL (ref 4.14–5.80)
RDW: 12.7 % (ref 11.6–15.4)
WBC: 7.5 10*3/uL (ref 3.4–10.8)

## 2022-03-28 LAB — LIPASE: Lipase: 34 U/L (ref 13–78)

## 2022-03-28 LAB — AMYLASE: Amylase: 49 U/L (ref 31–110)

## 2022-03-30 NOTE — Progress Notes (Signed)
Contacted via MyChart   Good morning Boen, your labs have returned and are overall stable. No reason for current symptoms found on labs.  Continue current plan of care and we will monitor sugars closely, may need to restart a low dose of Glipizide in future if sugars trend up.  Any questions? Keep being awesome!!  Thank you for allowing me to participate in your care.  I appreciate you. Kindest regards, Amyah Clawson

## 2022-04-02 DIAGNOSIS — Z79899 Other long term (current) drug therapy: Secondary | ICD-10-CM | POA: Diagnosis not present

## 2022-04-02 DIAGNOSIS — M0609 Rheumatoid arthritis without rheumatoid factor, multiple sites: Secondary | ICD-10-CM | POA: Diagnosis not present

## 2022-04-02 DIAGNOSIS — M159 Polyosteoarthritis, unspecified: Secondary | ICD-10-CM | POA: Diagnosis not present

## 2022-04-05 ENCOUNTER — Telehealth: Payer: Self-pay

## 2022-04-05 NOTE — Telephone Encounter (Signed)
Pt given lab results per notes of J. [name] NP on 04/05/22. Pt verbalized understanding.Pt stated he will decline the Glipizide and will speak at his upcoming appt.

## 2022-04-07 NOTE — Patient Instructions (Signed)

## 2022-04-08 ENCOUNTER — Encounter: Payer: Self-pay | Admitting: Nurse Practitioner

## 2022-04-08 ENCOUNTER — Ambulatory Visit (INDEPENDENT_AMBULATORY_CARE_PROVIDER_SITE_OTHER): Payer: Medicare Other | Admitting: Nurse Practitioner

## 2022-04-08 VITALS — BP 132/81 | HR 89 | Temp 98.2°F | Ht 67.99 in | Wt 198.4 lb

## 2022-04-08 DIAGNOSIS — R1084 Generalized abdominal pain: Secondary | ICD-10-CM

## 2022-04-08 DIAGNOSIS — E0821 Diabetes mellitus due to underlying condition with diabetic nephropathy: Secondary | ICD-10-CM

## 2022-04-08 MED ORDER — GLIMEPIRIDE 1 MG PO TABS: 0.5000 mg | ORAL_TABLET | Freq: Every day | ORAL | 12 refills | Status: DC

## 2022-04-08 NOTE — Assessment & Plan Note (Addendum)
Chronic, ongoing in patient with history of renal cell CA.  A1c trend up to 7.8% recent visit, from 7.3% due to dietary indiscretions.  Did not tolerate Trulicity, GI issues.  Wilder Glade tried with GI issues presenting. Sugars trending up without [place] on board. - Continue Metformin 1000 MG BID and Januvia 100 MG daily. Return to a low dose Amaryl, which he has taken in past -- 0.5 MG to start and ensure no frequent BS <70, he is aware to notify provider if this presents.  Goal to avoid insulin if possible. - Monitor BS daily in morning at home.  - Eye and foot exam up to date - Return in 3 weeks.

## 2022-04-08 NOTE — Progress Notes (Signed)
BP 132/81   Pulse 89   Temp 98.2 F (36.8 C) (Oral)   Ht 5' 7.99" (1.727 m)   Wt 198 lb 6.4 oz (90 kg)   SpO2 96%   BMI 30.17 kg/m    Subjective:    Patient ID: Jason Contreras, male    DOB: 09-28-1940, 81 y.o.   MRN: [id number]  HPI: Jason Contreras is a 81 y.o. male  Chief Complaint  Patient presents with   Diabetes    Follow up on started Farxiga but stopped 2 weeks ago from side effects.   DIABETES A1c in November was 7.8% and started Farxiga, however did not tolerate, started having abdominal pain with this and stopped medication.  All labs at time were reassuring.  He continues on Metformin 1000 MG BID and Januvia 100 MG daily.   Discontinued Actos and Amaryl over past years due to age and heart health. Tried Trulicity which caused GI upset and changed to Januvia which is tolerating.  Does endorse ongoing dietary indiscretions. Hypoglycemic episodes:no Polydipsia/polyuria: no Visual disturbance: no Chest pain: no Paresthesias: no Glucose Monitoring: a few times a week  Accucheck frequency:  as above  Fasting glucose: highest 180, lowest 140  Post prandial:  Evening:  Before meals: Taking Insulin?: no  Long acting insulin:  Short acting insulin: Blood Pressure Monitoring: a few times a week Retinal Examination: Up to Date Foot Exam: Up to Date Pneumovax: Up to Date Influenza: Up to Date Aspirin: no   Relevant past medical, surgical, family and social history reviewed and updated as indicated. Interim medical history since our last visit reviewed. Allergies and medications reviewed and updated.  Review of Systems  Constitutional:  Negative for activity change, diaphoresis, fatigue and fever.  Respiratory:  Negative for cough, chest tightness, shortness of breath and wheezing.   Cardiovascular:  Negative for chest pain, palpitations and leg swelling.  Gastrointestinal: Negative.   Endocrine: Negative for polydipsia, polyphagia and polyuria.  Neurological:   Negative for dizziness, syncope, weakness and numbness.  Psychiatric/Behavioral: Negative.      Per HPI unless specifically indicated above     Objective:    BP 132/81   Pulse 89   Temp 98.2 F (36.8 C) (Oral)   Ht 5' 7.99" (1.727 m)   Wt 198 lb 6.4 oz (90 kg)   SpO2 96%   BMI 30.17 kg/m   Wt Readings from Last 3 Encounters:  04/08/22 198 lb 6.4 oz (90 kg)  03/27/22 198 lb 3.2 oz (89.9 kg)  03/06/22 201 lb 6.4 oz (91.4 kg)    Physical Exam Vitals and nursing note reviewed.  Constitutional:      General: He is awake. He is not in acute distress.    Appearance: He is well-developed and well-groomed. He is obese. He is not ill-appearing or toxic-appearing.  HENT:     Head: Normocephalic and atraumatic.     Right Ear: Hearing normal. No drainage.     Left Ear: Hearing normal. No drainage.  Eyes:     General: Lids are normal.        Right eye: No discharge.        Left eye: No discharge.     Conjunctiva/sclera: Conjunctivae normal.     Pupils: Pupils are equal, round, and reactive to light.  Neck:     Thyroid: No thyromegaly.     Vascular: No carotid bruit or JVD.     Trachea: Trachea normal.  Cardiovascular:  Rate and Rhythm: Normal rate and regular rhythm.     Heart sounds: Normal heart sounds, S1 normal and S2 normal. No murmur heard.    No gallop.  Pulmonary:     Effort: Pulmonary effort is normal. No accessory muscle usage or respiratory distress.     Breath sounds: Normal breath sounds.  Abdominal:     General: Bowel sounds are normal.     Palpations: Abdomen is soft.  Musculoskeletal:        General: Normal range of motion.     Cervical back: Normal range of motion and neck supple.     Right lower leg: No edema.     Left lower leg: No edema.  Skin:    General: Skin is warm and dry.     Capillary Refill: Capillary refill takes less than 2 seconds.     Comments: Scattered pale purple/yellow bruises noted to BUE.  Neurological:     Mental Status: He is  alert and oriented to person, place, and time.     Coordination: Coordination is intact.     Gait: Gait is intact.     Deep Tendon Reflexes: Reflexes are normal and symmetric.     Reflex Scores:      Brachioradialis reflexes are 2+ on the right side and 2+ on the left side.      Patellar reflexes are 2+ on the right side and 2+ on the left side. Psychiatric:        Mood and Affect: Mood normal.        Behavior: Behavior normal. Behavior is cooperative.        Thought Content: Thought content normal.        Judgment: Judgment normal.     Results for orders placed or performed in visit on 03/27/22  Comprehensive metabolic panel  Result Value Ref Range   Glucose 168 (H) 70 - 99 mg/dL   BUN 16 8 - 27 mg/dL   Creatinine, Ser 0.95 0.76 - 1.27 mg/dL   eGFR 81 >59 mL/min/1.73   BUN/Creatinine Ratio 17 10 - 24   Sodium 138 134 - 144 mmol/L   Potassium 4.5 3.5 - 5.2 mmol/L   Chloride 98 96 - 106 mmol/L   CO2 25 20 - 29 mmol/L   Calcium 9.4 8.6 - 10.2 mg/dL   Total Protein 6.3 6.0 - 8.5 g/dL   Albumin 4.2 3.8 - 4.8 g/dL   Globulin, Total 2.1 1.5 - 4.5 g/dL   Albumin/Globulin Ratio 2.0 1.2 - 2.2   Bilirubin Total 0.6 0.0 - 1.2 mg/dL   Alkaline Phosphatase 82 44 - 121 IU/L   AST 14 0 - 40 IU/L   ALT 12 0 - 44 IU/L  Lipase  Result Value Ref Range   Lipase 34 13 - 78 U/L  Amylase  Result Value Ref Range   Amylase 49 31 - 110 U/L  CBC with Differential/Platelet  Result Value Ref Range   WBC 7.5 3.4 - 10.8 x10E3/uL   RBC 4.35 4.14 - 5.80 x10E6/uL   Hemoglobin 14.6 13.0 - 17.7 g/dL   Hematocrit 41.7 37.5 - 51.0 %   MCV 96 79 - 97 fL   MCH 33.6 (H) 26.6 - 33.0 pg   MCHC 35.0 31.5 - 35.7 g/dL   RDW 12.7 11.6 - 15.4 %   Platelets 289 150 - 450 x10E3/uL   Neutrophils 69 Not Estab. %   Lymphs 21 Not Estab. %   Monocytes 8 Not Estab. %  Eos 2 Not Estab. %   Basos 0 Not Estab. %   Neutrophils Absolute 5.2 1.4 - 7.0 x10E3/uL   Lymphocytes Absolute 1.6 0.7 - 3.1 x10E3/uL   Monocytes  Absolute 0.6 0.1 - 0.9 x10E3/uL   EOS (ABSOLUTE) 0.1 0.0 - 0.4 x10E3/uL   Basophils Absolute 0.0 0.0 - 0.2 x10E3/uL   Immature Granulocytes 0 Not Estab. %   Immature Grans (Abs) 0.0 0.0 - 0.1 x10E3/uL      Assessment & Plan:   Problem List Items Addressed This Visit       Endocrine   DM due to underlying condition with diabetic nephropathy ([place]) - Primary    Chronic, ongoing in patient with history of renal cell CA.  A1c trend up to 7.8% recent visit, from 7.3% due to dietary indiscretions.  Did not tolerate Trulicity, GI issues.  Wilder Glade tried with GI issues presenting. Sugars trending up without [place] on board. - Continue Metformin 1000 MG BID and Januvia 100 MG daily. Return to a low dose Amaryl, which he has taken in past -- 0.5 MG to start and ensure no frequent BS <70, he is aware to notify provider if this presents.  Goal to avoid insulin if possible. - Monitor BS daily in morning at home.  - Eye and foot exam up to date - Return in 67 weeks.      Relevant Medications   glimepiride (AMARYL) 1 MG tablet     Follow up plan: Return in about 8 weeks (around 06/05/2022) for Annual physical due after 06/04/22.

## 2022-04-12 ENCOUNTER — Ambulatory Visit: Payer: Self-pay | Admitting: *Deleted

## 2022-04-12 NOTE — Patient Outreach (Signed)
  Care Coordination   Follow Up Visit Note   04/12/2022 Name: Jason Contreras MRN: [id number] DOB: 09-10-1940  Jason Contreras is a 81 y.o. year old male who sees [place], Henrine Screws T, NP for primary care. I spoke with  Jason Contreras by phone today.  What matters to the patients health and wellness today?  Decrease A1C to goa of less than 7    Goals Addressed             This Visit's Progress    RNCM: Effective Management of DM   On track    Care Coordination Interventions: Lab Results  Component Value Date   HGBA1C 7.8 (H) 03/06/2022    Provided education to patient about basic DM disease process Reviewed medications with patient and discussed importance of medication adherence.  Discussed addition of Farxiga, unable to tolerate.  He has restarted Amaryl Counseled on importance of regular laboratory monitoring as prescribed Discussed plans with patient for ongoing care management follow up and provided patient with direct contact information for care management team Provided patient with written educational materials related to hypo and hyperglycemia and importance of correct treatment Reviewed scheduled/upcoming provider appointments including: 06/07/2022 and with  pharmacist on 1/22 Advised patient, providing education and rationale, to check cbg as directed  and record, calling pcp for findings outside established parameters. The patient is not checking his blood sugars. Review of the benefits of checking blood sugars and the patient agrees to start checking periodically. Education and support given.  Review of patient status, including review of consultants reports, relevant laboratory and other test results, and medications completed Discussed change in diet to increase vegetables and decrease carbs. Reviewed decrease in daily blood sugar readings to range of 170-180s initially.  Since starting new medication, readings have been 130-140s.   Active listening / Reflection utilized   Emotional Support Provided         SDOH assessments and interventions completed:  No     Care Coordination Interventions:  Yes, provided   Follow up plan: Follow up call scheduled for 2/6    Encounter Outcome:  Pt. Visit Completed   Valente David, RN, MSN, [facility] Care Management Coordinator [phone]

## 2022-04-12 NOTE — Patient Instructions (Signed)
Visit Information  Thank you for taking time to visit with me today. Please Jamin't hesitate to contact me if I can be of assistance to you before our next scheduled telephone appointment.  Following are the goals we discussed today:  Continue following diabetic diet and exercise.   Our next appointment is by telephone on 2/6  Please call the care guide team at [phone] if you need to cancel or reschedule your appointment.   Please call the Suicide and Crisis Lifeline: 988 call the [place] National Suicide Prevention Lifeline: 778-634-4197 or TTY: 332-356-0435 TTY 412-683-4246) to talk to a trained counselor call 1-800-273-TALK (toll free, 24 hour hotline) call 911 if you are experiencing a Mental Health or [place] or need someone to talk to.  Patient verbalizes understanding of instructions and care plan provided today and agrees to view in [place]. Active MyChart status and patient understanding of how to access instructions and care plan via MyChart confirmed with patient.     The patient has been provided with contact information for the care management team and has been advised to call with any health related questions or concerns.   Valente David, RN, MSN, [facility] Care Management Coordinator [phone]

## 2022-05-17 ENCOUNTER — Encounter: Payer: Self-pay | Admitting: Nurse Practitioner

## 2022-05-17 ENCOUNTER — Telehealth (INDEPENDENT_AMBULATORY_CARE_PROVIDER_SITE_OTHER): Payer: Medicare Other | Admitting: Nurse Practitioner

## 2022-05-17 DIAGNOSIS — R052 Subacute cough: Secondary | ICD-10-CM | POA: Insufficient documentation

## 2022-05-17 MED ORDER — AZITHROMYCIN 250 MG PO TABS: ORAL_TABLET | ORAL | 0 refills | Status: AC

## 2022-05-17 MED ORDER — ALBUTEROL SULFATE HFA 108 (90 BASE) MCG/ACT IN AERS: 2.0000 | INHALATION_SPRAY | Freq: Four times a day (QID) | RESPIRATORY_TRACT | 0 refills | Status: DC | PRN

## 2022-05-17 NOTE — Assessment & Plan Note (Signed)
Ongoing since prior to Christmas, tested negative for Covid x 2.  Lingering cough present.  At this time will start a Zpack to help with cough and inflammation lingering + send in Albuterol inhaler to use as needed. Recommend: - Increased rest - Increasing Fluids - Acetaminophen as needed for fever/pain.  - Salt water gargling, chloraseptic spray and throat lozenges - OTC Coricidin - Mucinex.  - Humidifying the air.

## 2022-05-17 NOTE — Patient Instructions (Signed)

## 2022-05-17 NOTE — Progress Notes (Signed)
There were no vitals taken for this visit.   Subjective:    Patient ID: Jason Contreras, male    DOB: July 31, 1940, 82 y.o.   MRN: [id number]  HPI: Jason Contreras is a 82 y.o. male  Chief Complaint  Patient presents with   Cough    Since Christmas and can get rid of it.    This visit was completed via video visit through MyChart due to the restrictions of the COVID-19 pandemic. All issues as above were discussed and addressed. Physical exam was done as above through visual confirmation on video through MyChart. If it was felt that the patient should be evaluated in the office, they were directed there. The patient verbally consented to this visit. Location of the patient: home Location of the provider: work Those involved with this call:  Provider: [name], DNP CMA: Frazier Butt, [place] Desk/Registration: [employer]  Time spent on call:  21 minutes with patient face to face via video conference. More than 50% of this time was spent in counseling and coordination of care. 15 minutes total spent in review of patient's record and preparation of their chart.  I verified patient identity using two factors (patient name and date of birth). Patient consents verbally to being seen via telemedicine visit today.    COUGH Has had a cough present since before Christmas.  Did test for Covid at home, all negative x 2.  Reports ongoing lingering cough, no further sinus symptoms.  Notices symptoms more when up moving around.  Everyone else in household is better. Duration: weeks Circumstances of initial development of cough: URI Cough severity: moderate Cough description: non-productive Aggravating factors:   worse when up moving around Alleviating factors: mucinex and cough syrup Status:  fluctuating Treatments attempted: cold/sinus, mucinex, and cough syrup Wheezing:  occasional at night time Shortness of breath: no Chest pain: no Chest tightness:no Nasal congestion: no Runny  nose: no Postnasal drip: no Frequent throat clearing or swallowing: no Hemoptysis: no Fevers: no Night sweats: no Weight loss: no Heartburn: no Recent foreign travel: no Tuberculosis contacts: no   Relevant past medical, surgical, family and social history reviewed and updated as indicated. Interim medical history since our last visit reviewed. Allergies and medications reviewed and updated.  Review of Systems  Constitutional:  Negative for activity change, appetite change, chills, fatigue and fever.  HENT: Negative.    Respiratory:  Positive for cough and chest tightness. Negative for shortness of breath and wheezing.   Cardiovascular: Negative.   Neurological: Negative.   Psychiatric/Behavioral: Negative.      Per HPI unless specifically indicated above     Objective:    There were no vitals taken for this visit.  Wt Readings from Last 3 Encounters:  04/08/22 198 lb 6.4 oz (90 kg)  03/27/22 198 lb 3.2 oz (89.9 kg)  03/06/22 201 lb 6.4 oz (91.4 kg)    Physical Exam Vitals and nursing note reviewed.  Constitutional:      General: He is awake. He is not in acute distress.    Appearance: He is well-developed and well-groomed. He is not ill-appearing or toxic-appearing.  HENT:     Head: Normocephalic.     Right Ear: Hearing normal. No drainage.     Left Ear: Hearing normal. No drainage.  Eyes:     General: Lids are normal.        Right eye: No discharge.        Left eye: No discharge.  Conjunctiva/sclera: Conjunctivae normal.  Pulmonary:     Effort: Pulmonary effort is normal. No accessory muscle usage or respiratory distress.  Musculoskeletal:     Cervical back: Normal range of motion.  Neurological:     Mental Status: He is alert and oriented to person, place, and time.  Psychiatric:        Mood and Affect: Mood normal.        Behavior: Behavior normal. Behavior is cooperative.        Thought Content: Thought content normal.        Judgment: Judgment  normal.     Results for orders placed or performed in visit on 03/27/22  Comprehensive metabolic panel  Result Value Ref Range   Glucose 168 (H) 70 - 99 mg/dL   BUN 16 8 - 27 mg/dL   Creatinine, Ser 0.95 0.76 - 1.27 mg/dL   eGFR 81 >59 mL/min/1.73   BUN/Creatinine Ratio 17 10 - 24   Sodium 138 134 - 144 mmol/L   Potassium 4.5 3.5 - 5.2 mmol/L   Chloride 98 96 - 106 mmol/L   CO2 25 20 - 29 mmol/L   Calcium 9.4 8.6 - 10.2 mg/dL   Total Protein 6.3 6.0 - 8.5 g/dL   Albumin 4.2 3.8 - 4.8 g/dL   Globulin, Total 2.1 1.5 - 4.5 g/dL   Albumin/Globulin Ratio 2.0 1.2 - 2.2   Bilirubin Total 0.6 0.0 - 1.2 mg/dL   Alkaline Phosphatase 82 44 - 121 IU/L   AST 14 0 - 40 IU/L   ALT 12 0 - 44 IU/L  Lipase  Result Value Ref Range   Lipase 34 13 - 78 U/L  Amylase  Result Value Ref Range   Amylase 49 31 - 110 U/L  CBC with Differential/Platelet  Result Value Ref Range   WBC 7.5 3.4 - 10.8 x10E3/uL   RBC 4.35 4.14 - 5.80 x10E6/uL   Hemoglobin 14.6 13.0 - 17.7 g/dL   Hematocrit 41.7 37.5 - 51.0 %   MCV 96 79 - 97 fL   MCH 33.6 (H) 26.6 - 33.0 pg   MCHC 35.0 31.5 - 35.7 g/dL   RDW 12.7 11.6 - 15.4 %   Platelets 289 150 - 450 x10E3/uL   Neutrophils 69 Not Estab. %   Lymphs 21 Not Estab. %   Monocytes 8 Not Estab. %   Eos 2 Not Estab. %   Basos 0 Not Estab. %   Neutrophils Absolute 5.2 1.4 - 7.0 x10E3/uL   Lymphocytes Absolute 1.6 0.7 - 3.1 x10E3/uL   Monocytes Absolute 0.6 0.1 - 0.9 x10E3/uL   EOS (ABSOLUTE) 0.1 0.0 - 0.4 x10E3/uL   Basophils Absolute 0.0 0.0 - 0.2 x10E3/uL   Immature Granulocytes 0 Not Estab. %   Immature Grans (Abs) 0.0 0.0 - 0.1 x10E3/uL      Assessment & Plan:   Problem List Items Addressed This Visit       Other   Subacute cough - Primary    Ongoing since prior to Christmas, tested negative for Covid x 2.  Lingering cough present.  At this time will start a Zpack to help with cough and inflammation lingering + send in Albuterol inhaler to use as needed.  Recommend: - Increased rest - Increasing Fluids - Acetaminophen as needed for fever/pain.  - Salt water gargling, chloraseptic spray and throat lozenges - OTC Coricidin - Mucinex.  - Humidifying the air.        I discussed the assessment and treatment  plan with the patient. The patient was provided an opportunity to ask questions and all were answered. The patient agreed with the plan and demonstrated an understanding of the instructions.   The patient was advised to call back or seek an in-person evaluation if the symptoms worsen or if the condition fails to improve as anticipated.   I provided 21+ minutes of time during this encounter.    Follow up plan: Return if symptoms worsen or fail to improve.

## 2022-05-24 ENCOUNTER — Telehealth: Payer: Self-pay

## 2022-05-24 NOTE — Telephone Encounter (Signed)
Paperwork faxed back to [employer]

## 2022-06-02 NOTE — Patient Instructions (Incomplete)
Diabetes Mellitus Basics  Diabetes mellitus, or diabetes, is a long-term (chronic) disease. It occurs when the body does not properly use sugar (glucose) that is released from food after you eat. Diabetes mellitus may be caused by one or both of these problems: Your pancreas does not make enough of a hormone called insulin. Your body does not react in a normal way to the insulin that it makes. Insulin lets glucose enter cells in your body. This gives you energy. If you have diabetes, glucose cannot get into cells. This causes high blood glucose (hyperglycemia). How to treat and manage diabetes You may need to take insulin or other diabetes medicines daily to keep your glucose in balance. If you are prescribed insulin, you will learn how to give yourself insulin by injection. You may need to adjust the amount of insulin you take based on the foods that you eat. You will need to check your blood glucose levels using a glucose monitor as told by your health care provider. The readings can help determine if you have low or high blood glucose. Generally, you should have these blood glucose levels: Before meals (preprandial): 80-130 mg/dL (4.4-7.2 mmol/L). After meals (postprandial): below 180 mg/dL (10 mmol/L). Hemoglobin A1c (HbA1c) level: less than 7%. Your health care provider will set treatment goals for you. Keep all follow-up visits. This is important. Follow these instructions at home: Diabetes medicines Take your diabetes medicines every day as told by your health care provider. List your diabetes medicines here: Name of medicine: ______________________________ Amount (dose): _______________ Time (a.m./p.m.): _______________ Notes: ___________________________________ Name of medicine: ______________________________ Amount (dose): _______________ Time (a.m./p.m.): _______________ Notes: ___________________________________ Name of medicine: ______________________________ Amount (dose):  _______________ Time (a.m./p.m.): _______________ Notes: ___________________________________ Insulin If you use insulin, list the types of insulin you use here: Insulin type: ______________________________ Amount (dose): _______________ Time (a.m./p.m.): _______________Notes: ___________________________________ Insulin type: ______________________________ Amount (dose): _______________ Time (a.m./p.m.): _______________ Notes: ___________________________________ Insulin type: ______________________________ Amount (dose): _______________ Time (a.m./p.m.): _______________ Notes: ___________________________________ Insulin type: ______________________________ Amount (dose): _______________ Time (a.m./p.m.): _______________ Notes: ___________________________________ Insulin type: ______________________________ Amount (dose): _______________ Time (a.m./p.m.): _______________ Notes: ___________________________________ Managing blood glucose  Check your blood glucose levels using a glucose monitor as told by your health care provider. Write down the times that you check your glucose levels here: Time: _______________ Notes: ___________________________________ Time: _______________ Notes: ___________________________________ Time: _______________ Notes: ___________________________________ Time: _______________ Notes: ___________________________________ Time: _______________ Notes: ___________________________________ Time: _______________ Notes: ___________________________________  Low blood glucose Low blood glucose (hypoglycemia) is when glucose is at or below 70 mg/dL (3.9 mmol/L). Symptoms may include: Feeling: Hungry. Sweaty and clammy. Irritable or easily upset. Dizzy. Sleepy. Having: A fast heartbeat. A headache. A change in your vision. Numbness around the mouth, lips, or tongue. Having trouble with: Moving (coordination). Sleeping. Treating low blood glucose To treat low blood  glucose, eat or drink something containing sugar right away. If you can think clearly and swallow safely, follow the 15:15 rule: Take 15 grams of a fast-acting carb (carbohydrate), as told by your health care provider. Some fast-acting carbs are: Glucose tablets: take 3-4 tablets. Hard candy: eat 3-5 pieces. Fruit juice: drink 4 oz (120 mL). Regular (not diet) soda: drink 4-6 oz (120-180 mL). Honey or sugar: eat 1 Tbsp (15 mL). Check your blood glucose levels 15 minutes after you take the carb. If your glucose is still at or below 70 mg/dL (3.9 mmol/L), take 15 grams of a carb again. If your glucose does not go above 70 mg/dL (3.9 mmol/L) after   3 tries, get help right away. After your glucose goes back to normal, eat a meal or a snack within 1 hour. Treating very low blood glucose If your glucose is at or below 54 mg/dL (3 mmol/L), you have very low blood glucose (severe hypoglycemia). This is an emergency. Do not wait to see if the symptoms will go away. Get medical help right away. Call your local emergency services (911 in the [place] Do not drive yourself to the hospital. Questions to ask your health care provider Should I talk with a diabetes educator? What equipment will I need to care for myself at home? What diabetes medicines do I need? When should I take them? How often do I need to check my blood glucose levels? What number can I call if I have questions? When is my follow-up visit? Where can I find a support group for people with diabetes? Where to find more information American Diabetes Association: www.diabetes.org Association of Diabetes Care and Education Specialists: www.diabeteseducator.org Contact a health care provider if: Your blood glucose is at or above 240 mg/dL (13.3 mmol/L) for 2 days in a row. You have been sick or have had a fever for 2 days or more, and you are not getting better. You have any of these problems for more than 6 hours: You cannot eat or  drink. You feel nauseous. You vomit. You have diarrhea. Get help right away if: Your blood glucose is lower than 54 mg/dL (3 mmol/L). You get confused. You have trouble thinking clearly. You have trouble breathing. These symptoms may represent a serious problem that is an emergency. Do not wait to see if the symptoms will go away. Get medical help right away. Call your local emergency services (911 in the [place] Do not drive yourself to the hospital. Summary Diabetes mellitus is a chronic disease that occurs when the body does not properly use sugar (glucose) that is released from food after you eat. Take insulin and diabetes medicines as told. Check your blood glucose every day, as often as told. Keep all follow-up visits. This is important. This information is not intended to replace advice given to you by your health care provider. Make sure you discuss any questions you have with your health care provider. Document Revised: 08/24/2019 Document Reviewed: 08/24/2019 Elsevier Patient Education  2023 [employer]  

## 2022-06-07 ENCOUNTER — Ambulatory Visit: Payer: Medicare Other | Admitting: Nurse Practitioner

## 2022-06-07 DIAGNOSIS — I700 Atherosclerosis of aorta: Secondary | ICD-10-CM

## 2022-06-07 DIAGNOSIS — D692 Other nonthrombocytopenic purpura: Secondary | ICD-10-CM

## 2022-06-07 DIAGNOSIS — E1169 Type 2 diabetes mellitus with other specified complication: Secondary | ICD-10-CM

## 2022-06-07 DIAGNOSIS — Z85528 Personal history of other malignant neoplasm of kidney: Secondary | ICD-10-CM

## 2022-06-07 DIAGNOSIS — I152 Hypertension secondary to endocrine disorders: Secondary | ICD-10-CM

## 2022-06-07 DIAGNOSIS — M0609 Rheumatoid arthritis without rheumatoid factor, multiple sites: Secondary | ICD-10-CM

## 2022-06-07 DIAGNOSIS — N138 Other obstructive and reflux uropathy: Secondary | ICD-10-CM

## 2022-06-07 DIAGNOSIS — E538 Deficiency of other specified B group vitamins: Secondary | ICD-10-CM

## 2022-06-07 DIAGNOSIS — R972 Elevated prostate specific antigen [PSA]: Secondary | ICD-10-CM

## 2022-06-07 DIAGNOSIS — E0821 Diabetes mellitus due to underlying condition with diabetic nephropathy: Secondary | ICD-10-CM

## 2022-06-07 DIAGNOSIS — E559 Vitamin D deficiency, unspecified: Secondary | ICD-10-CM

## 2022-06-09 NOTE — Patient Instructions (Signed)
Diabetes Mellitus Basics  Diabetes mellitus, or diabetes, is a long-term (chronic) disease. It occurs when the body does not properly use sugar (glucose) that is released from food after you eat. Diabetes mellitus may be caused by one or both of these problems: Your pancreas does not make enough of a hormone called insulin. Your body does not react in a normal way to the insulin that it makes. Insulin lets glucose enter cells in your body. This gives you energy. If you have diabetes, glucose cannot get into cells. This causes high blood glucose (hyperglycemia). How to treat and manage diabetes You may need to take insulin or other diabetes medicines daily to keep your glucose in balance. If you are prescribed insulin, you will learn how to give yourself insulin by injection. You may need to adjust the amount of insulin you take based on the foods that you eat. You will need to check your blood glucose levels using a glucose monitor as told by your health care provider. The readings can help determine if you have low or high blood glucose. Generally, you should have these blood glucose levels: Before meals (preprandial): 80-130 mg/dL (4.4-7.2 mmol/L). After meals (postprandial): below 180 mg/dL (10 mmol/L). Hemoglobin A1c (HbA1c) level: less than 7%. Your health care provider will set treatment goals for you. Keep all follow-up visits. This is important. Follow these instructions at home: Diabetes medicines Take your diabetes medicines every day as told by your health care provider. List your diabetes medicines here: Name of medicine: ______________________________ Amount (dose): _______________ Time (a.m./p.m.): _______________ Notes: ___________________________________ Name of medicine: ______________________________ Amount (dose): _______________ Time (a.m./p.m.): _______________ Notes: ___________________________________ Name of medicine: ______________________________ Amount (dose):  _______________ Time (a.m./p.m.): _______________ Notes: ___________________________________ Insulin If you use insulin, list the types of insulin you use here: Insulin type: ______________________________ Amount (dose): _______________ Time (a.m./p.m.): _______________Notes: ___________________________________ Insulin type: ______________________________ Amount (dose): _______________ Time (a.m./p.m.): _______________ Notes: ___________________________________ Insulin type: ______________________________ Amount (dose): _______________ Time (a.m./p.m.): _______________ Notes: ___________________________________ Insulin type: ______________________________ Amount (dose): _______________ Time (a.m./p.m.): _______________ Notes: ___________________________________ Insulin type: ______________________________ Amount (dose): _______________ Time (a.m./p.m.): _______________ Notes: ___________________________________ Managing blood glucose  Check your blood glucose levels using a glucose monitor as told by your health care provider. Write down the times that you check your glucose levels here: Time: _______________ Notes: ___________________________________ Time: _______________ Notes: ___________________________________ Time: _______________ Notes: ___________________________________ Time: _______________ Notes: ___________________________________ Time: _______________ Notes: ___________________________________ Time: _______________ Notes: ___________________________________  Low blood glucose Low blood glucose (hypoglycemia) is when glucose is at or below 70 mg/dL (3.9 mmol/L). Symptoms may include: Feeling: Hungry. Sweaty and clammy. Irritable or easily upset. Dizzy. Sleepy. Having: A fast heartbeat. A headache. A change in your vision. Numbness around the mouth, lips, or tongue. Having trouble with: Moving (coordination). Sleeping. Treating low blood glucose To treat low blood  glucose, eat or drink something containing sugar right away. If you can think clearly and swallow safely, follow the 15:15 rule: Take 15 grams of a fast-acting carb (carbohydrate), as told by your health care provider. Some fast-acting carbs are: Glucose tablets: take 3-4 tablets. Hard candy: eat 3-5 pieces. Fruit juice: drink 4 oz (120 mL). Regular (not diet) soda: drink 4-6 oz (120-180 mL). Honey or sugar: eat 1 Tbsp (15 mL). Check your blood glucose levels 15 minutes after you take the carb. If your glucose is still at or below 70 mg/dL (3.9 mmol/L), take 15 grams of a carb again. If your glucose does not go above 70 mg/dL (3.9 mmol/L) after   3 tries, get help right away. After your glucose goes back to normal, eat a meal or a snack within 1 hour. Treating very low blood glucose If your glucose is at or below 54 mg/dL (3 mmol/L), you have very low blood glucose (severe hypoglycemia). This is an emergency. Do not wait to see if the symptoms will go away. Get medical help right away. Call your local emergency services (911 in the [place] Do not drive yourself to the hospital. Questions to ask your health care provider Should I talk with a diabetes educator? What equipment will I need to care for myself at home? What diabetes medicines do I need? When should I take them? How often do I need to check my blood glucose levels? What number can I call if I have questions? When is my follow-up visit? Where can I find a support group for people with diabetes? Where to find more information American Diabetes Association: www.diabetes.org Association of Diabetes Care and Education Specialists: www.diabeteseducator.org Contact a health care provider if: Your blood glucose is at or above 240 mg/dL (13.3 mmol/L) for 2 days in a row. You have been sick or have had a fever for 2 days or more, and you are not getting better. You have any of these problems for more than 6 hours: You cannot eat or  drink. You feel nauseous. You vomit. You have diarrhea. Get help right away if: Your blood glucose is lower than 54 mg/dL (3 mmol/L). You get confused. You have trouble thinking clearly. You have trouble breathing. These symptoms may represent a serious problem that is an emergency. Do not wait to see if the symptoms will go away. Get medical help right away. Call your local emergency services (911 in the [place] Do not drive yourself to the hospital. Summary Diabetes mellitus is a chronic disease that occurs when the body does not properly use sugar (glucose) that is released from food after you eat. Take insulin and diabetes medicines as told. Check your blood glucose every day, as often as told. Keep all follow-up visits. This is important. This information is not intended to replace advice given to you by your health care provider. Make sure you discuss any questions you have with your health care provider. Document Revised: 08/24/2019 Document Reviewed: 08/24/2019 Elsevier Patient Education  2023 Elsevier Inc.  

## 2022-06-11 ENCOUNTER — Ambulatory Visit: Payer: Self-pay | Admitting: *Deleted

## 2022-06-11 NOTE — Patient Outreach (Signed)
  Care Coordination   06/11/2022 Name: ALSON MCPHEETERS MRN: [id number] DOB: May 25, 1940   Care Coordination Outreach Attempts:  An unsuccessful telephone outreach was attempted for a scheduled appointment today.  Follow Up Plan:  Additional outreach attempts will be made to offer the patient care coordination information and services.   Encounter Outcome:  No Answer   Care Coordination Interventions:  No, not indicated    Valente David, RN, MSN, [facility] Care Management Care Management Coordinator [phone]

## 2022-06-12 ENCOUNTER — Ambulatory Visit (INDEPENDENT_AMBULATORY_CARE_PROVIDER_SITE_OTHER): Payer: Medicare Other | Admitting: Nurse Practitioner

## 2022-06-12 ENCOUNTER — Encounter: Payer: Self-pay | Admitting: Nurse Practitioner

## 2022-06-12 VITALS — BP 132/76 | HR 71 | Temp 97.6°F | Ht 67.99 in | Wt 198.4 lb

## 2022-06-12 DIAGNOSIS — E782 Mixed hyperlipidemia: Secondary | ICD-10-CM

## 2022-06-12 DIAGNOSIS — R972 Elevated prostate specific antigen [PSA]: Secondary | ICD-10-CM

## 2022-06-12 DIAGNOSIS — I700 Atherosclerosis of aorta: Secondary | ICD-10-CM | POA: Diagnosis not present

## 2022-06-12 DIAGNOSIS — Z0000 Encounter for general adult medical examination without abnormal findings: Secondary | ICD-10-CM | POA: Diagnosis not present

## 2022-06-12 DIAGNOSIS — E538 Deficiency of other specified B group vitamins: Secondary | ICD-10-CM | POA: Diagnosis not present

## 2022-06-12 DIAGNOSIS — I10 Essential (primary) hypertension: Secondary | ICD-10-CM

## 2022-06-12 DIAGNOSIS — E785 Hyperlipidemia, unspecified: Secondary | ICD-10-CM | POA: Diagnosis not present

## 2022-06-12 DIAGNOSIS — E1159 Type 2 diabetes mellitus with other circulatory complications: Secondary | ICD-10-CM | POA: Diagnosis not present

## 2022-06-12 DIAGNOSIS — E1169 Type 2 diabetes mellitus with other specified complication: Secondary | ICD-10-CM | POA: Diagnosis not present

## 2022-06-12 DIAGNOSIS — Z85528 Personal history of other malignant neoplasm of kidney: Secondary | ICD-10-CM

## 2022-06-12 DIAGNOSIS — I152 Hypertension secondary to endocrine disorders: Secondary | ICD-10-CM

## 2022-06-12 DIAGNOSIS — E559 Vitamin D deficiency, unspecified: Secondary | ICD-10-CM

## 2022-06-12 DIAGNOSIS — N138 Other obstructive and reflux uropathy: Secondary | ICD-10-CM

## 2022-06-12 DIAGNOSIS — N401 Enlarged prostate with lower urinary tract symptoms: Secondary | ICD-10-CM

## 2022-06-12 DIAGNOSIS — E0821 Diabetes mellitus due to underlying condition with diabetic nephropathy: Secondary | ICD-10-CM

## 2022-06-12 DIAGNOSIS — M0609 Rheumatoid arthritis without rheumatoid factor, multiple sites: Secondary | ICD-10-CM

## 2022-06-12 DIAGNOSIS — D692 Other nonthrombocytopenic purpura: Secondary | ICD-10-CM

## 2022-06-12 MED ORDER — METHOTREXATE SODIUM 2.5 MG PO TABS: ORAL_TABLET | ORAL | 0 refills | Status: DC

## 2022-06-12 NOTE — Progress Notes (Addendum)
BP 132/76 (BP Location: Left Arm, Patient Position: Sitting, Cuff Size: Normal)   Pulse 71   Temp 97.6 F (36.4 C) (Oral)   Ht 5' 7.99" (1.727 m)   Wt 198 lb 6.4 oz (90 kg)   SpO2 98%   BMI 30.17 kg/m    Subjective:    Patient ID: Jason Contreras, male    DOB: Sep 28, 1940, 82 y.o.   MRN: [id number]  HPI: Jason Contreras is a 82 y.o. male presenting on 06/12/2022 for comprehensive medical examination. Current medical complaints include:none  He currently lives with: wife Interim Problems from his last visit: no   DIABETES A1c 7.8% November, we added on low dose of Glimepiride at that time.  Continues on Metformin 1000 MG BID, Glimepiride 0.5 MG daily, and Januvia 100 MG daily.  Continues on Gabapentin 600 MG TID and B12 for neuropathy + Vitamin D for history of low levels. Hypoglycemic episodes:no Polydipsia/polyuria: no Visual disturbance: no Chest pain: no Paresthesias: no Glucose Monitoring: no  Accucheck frequency: three times a week  Fasting glucose: 90 this morning  Post prandial:  Evening:  Before meals: Taking Insulin?: no  Long acting insulin:  Short acting insulin: Blood Pressure Monitoring: a few times a week Retinal Examination: Not up to Date Foot Exam: Up to Date Pneumovax: Not up to Date Influenza: Up to Date Aspirin: no   HYPERTENSION / HYPERLIPIDEMIA Continues on Atorvastatin 20 MG and Coreg 6.25 MG BID.  Aortic atherosclerosis noted on CXR 07/20/19. Satisfied with current treatment? yes Duration of hypertension: chronic BP monitoring frequency: a few times a week BP range:  BP medication side effects: no Duration of hyperlipidemia: chronic Cholesterol medication side effects: no Cholesterol supplements: none Medication compliance: good compliance Aspirin: no Recent stressors: no Recurrent headaches: no Visual changes: no Palpitations: no Dyspnea: no Chest pain: no Lower extremity edema: no Dizzy/lightheaded: no   RHEUMATOID ARTHRITIS He  is currently followed by rheumatology for RA of multiple sites with negative Rf.  Last visit 04/02/22.  Overall has been much improved since taking Methotrexate -- is out of this. Pain control status: improved Duration: chronic Location: multiple areas What Activities task can be accomplished with current medication? ADL's daily Previous pain specialty evaluation: yes Non-narcotic analgesic meds: yes   ELEVATED PSA: Being followed by urology.  Last PSA 4.0 and last visit with urology on 09/07/20. Had negative biopsy.  Has history of kidney cancer to right side, had two operations on this.  Continues on Tamsulosin and Finasteride.   Functional Status Survey: Is the patient deaf or have difficulty hearing?: No Does the patient have difficulty seeing, even when wearing glasses/contacts?: No Does the patient have difficulty concentrating, remembering, or making decisions?: No Does the patient have difficulty walking or climbing stairs?: No Does the patient have difficulty dressing or bathing?: No Does the patient have difficulty doing errands alone such as visiting a doctor's office or shopping?: No  FALL RISK:    06/12/2022    2:44 PM 06/12/2022    2:33 PM 05/17/2022    1:54 PM 03/27/2022    1:50 PM 01/02/2022    2:27 PM  [place] in the past year? 0 0 0 0 0  Number falls in past yr: 0 0 0 0 0  Injury with Fall? 0 0 0 0 0  Risk for fall due to : No Fall Risks No Fall Risks No Fall Risks No Fall Risks No Fall Risks  Follow up Falls  prevention discussed Falls evaluation completed Falls evaluation completed Falls evaluation completed Falls evaluation completed   Depression Screen    06/12/2022    2:33 PM 05/17/2022    1:54 PM 03/27/2022    1:50 PM 02/04/2022   11:48 AM 01/02/2022    2:27 PM  Depression screen PHQ 2/9  Decreased Interest 0 0 0 0 0  Down, Depressed, Hopeless 0 0 0 0 0  PHQ - 2 Score 0 0 0 0 0  Altered sleeping 0 0 0 0 0  Tired, decreased energy 0 0 0 0 0  Change  in appetite 0 0 0 0 0  Feeling bad or failure about yourself  0 0 0 0 0  Trouble concentrating 0 0 0 0 0  Moving slowly or fidgety/restless 0 0 0 0 0  Suicidal thoughts 0 0 0 0 0  PHQ-9 Score 0 0 0 0 0  Difficult doing work/chores Not difficult at all Not difficult at all Not difficult at all Not difficult at all Not difficult at all   Past Medical History:  Past Medical History:  Diagnosis Date   Adenocarcinoma, renal cell ([place])    Arthritis    Collagen vascular disease ([place])    Diabetes mellitus without complication ([place])    Elevated PSA    Headache    Hematuria    Hyperlipidemia    Hypertension    Neuropathy    Obesity    PONV (postoperative nausea and vomiting)    Renal insufficiency    Right renal mass     Surgical History:  Past Surgical History:  Procedure Laterality Date   APPENDECTOMY     CRYOABLATION  10/17/2017   IR RADIOLOGIST EVAL & MGMT  07/17/2017   IR RADIOLOGIST EVAL & MGMT  08/13/2017   IR RADIOLOGIST EVAL & MGMT  11/12/2017   IR RADIOLOGIST EVAL & MGMT  03/05/2018   RADIOLOGY WITH ANESTHESIA Right 10/17/2017   Procedure: CT WITH ANESTHESIA RENAL CRYOABLATION;  Surgeon: [name], MD;  Location: [facility];  Service: Anesthesiology;  Laterality: Right;   ROBOTIC ASSITED PARTIAL NEPHRECTOMY Right 12/12/2015   Procedure: ROBOTIC ASSITED PARTIAL NEPHRECTOMY;  Surgeon: [name], MD;  Location: [facility];  Service: Urology;  Laterality: Right;    Medications:  Current Outpatient Medications on File Prior to Visit  Medication Sig   acetaminophen (TYLENOL) 325 MG tablet Take 650 mg by mouth every 6 (six) hours as needed. Takes at bedtime and occasionally in am for back/hip pain   albuterol (VENTOLIN HFA) 108 (90 Base) MCG/ACT inhaler Inhale 2 puffs into the lungs every 6 (six) hours as needed for wheezing or shortness of breath.   atorvastatin (LIPITOR) 20 MG tablet TAKE 1 TABLET BY MOUTH  DAILY AT 6 PM.   B Complex-C-Folic Acid (SUPER B COMPLEX/FA/VIT C PO)  Take 1 tablet by mouth daily. Contains [id number] mcg folic acid   carvedilol (COREG) 6.25 MG tablet Take 1 tablet (6.25 mg total) by mouth 2 (two) times daily.   cholecalciferol (VITAMIN D3) 25 MCG (1000 UNIT) tablet Take 2,000 Units by mouth daily.    DULoxetine (CYMBALTA) 60 MG capsule Take 1 capsule (60 mg total) by mouth daily.   finasteride (PROSCAR) 5 MG tablet Take 1 tablet (5 mg total) by mouth daily.   folic acid (FOLVITE) 1 MG tablet Take 1 tablet (1 mg total) by mouth daily.   gabapentin (NEURONTIN) 600 MG tablet TAKE 1 TABLET BY MOUTH 3  TIMES DAILY   glimepiride (  AMARYL) 1 MG tablet Take 0.5 tablets (0.5 mg total) by mouth daily with breakfast.   lidocaine (LMX) 4 % cream Apply 1 application topically as needed.   metFORMIN (GLUCOPHAGE) 500 MG tablet TAKE 2 TABLETS BY MOUTH TWICE  DAILY   mometasone (ELOCON) 0.1 % cream APPLY  CREAM TOPICALLY ONCE DAILY   sitaGLIPtin (JANUVIA) 100 MG tablet Take 1 tablet (100 mg total) by mouth daily.   tamsulosin (FLOMAX) 0.4 MG CAPS capsule Take 1 capsule (0.4 mg total) by mouth daily.   vitamin C (ASCORBIC ACID) 500 MG tablet Take 500 mg by mouth daily.   No current facility-administered medications on file prior to visit.    Allergies:  Allergies  Allergen Reactions   Dapagliflozin Nausea And Vomiting    Made him really sick   Morphine Nausea And Vomiting   Trulicity [Dulaglutide] Nausea And Vomiting    Social History:  Social History   Socioeconomic History   Marital status: Married    Spouse name: Not on file   Number of children: Not on file   Years of education: 3 years college    Highest education level: Some college, no degree  Occupational History   Not on file  Tobacco Use   Smoking status: Never   Smokeless tobacco: Never  Vaping Use   Vaping Use: Never used  Substance and Sexual Activity   Alcohol use: No   Drug use: No   Sexual activity: Not Currently  Other Topics Concern   Not on file  Social History Narrative    Not on file   Social Determinants of Health   Financial Resource Strain: Low Risk  (02/04/2022)   Overall Financial Resource Strain (CARDIA)    Difficulty of Paying Living Expenses: Not hard at all  Food Insecurity: No Food Insecurity (02/04/2022)   Hunger Vital Sign    Worried About Running Out of Food in the Last Year: Never true    [place] in the Last Year: Never true  Transportation Needs: No Transportation Needs (02/04/2022)   PRAPARE - Hydrologist (Medical): No    Lack of Transportation (Non-Medical): No  Physical Activity: Inactive (02/04/2022)   Exercise Vital Sign    Days of Exercise per Week: 0 days    Minutes of Exercise per Session: 0 min  Stress: No Stress Concern Present (02/04/2022)   [place]    Feeling of Stress : Not at all  Social Connections: Moderately Integrated (02/04/2022)   Social Connection and Isolation Panel [NHANES]    Frequency of Communication with Friends and Family: More than three times a week    Frequency of Social Gatherings with Friends and Family: Twice a week    Attends Religious Services: More than 4 times per year    Active Member of [employer] or Organizations: No    Attends Archivist Meetings: Never    Marital Status: Married  Human resources officer Violence: Not At Risk (02/04/2022)   Humiliation, Afraid, Rape, and Kick questionnaire    Fear of Current or Ex-Partner: No    Emotionally Abused: No    Physically Abused: No    Sexually Abused: No   Social History   Tobacco Use  Smoking Status Never  Smokeless Tobacco Never   Social History   Substance and Sexual Activity  Alcohol Use No    Family History:  Family History  Problem Relation Age of Onset  Bone cancer Mother    Cancer Mother    Cancer Father    Stroke Brother    Kidney disease Neg Hx     Past medical history, surgical history, medications, allergies,  family history and social history reviewed with patient today and changes made to appropriate areas of the chart.   Review of Systems - negative All other ROS negative except what is listed above and in the HPI.      Objective:    BP 132/76 (BP Location: Left Arm, Patient Position: Sitting, Cuff Size: Normal)   Pulse 71   Temp 97.6 F (36.4 C) (Oral)   Ht 5' 7.99" (1.727 m)   Wt 198 lb 6.4 oz (90 kg)   SpO2 98%   BMI 30.17 kg/m   Wt Readings from Last 3 Encounters:  06/12/22 198 lb 6.4 oz (90 kg)  04/08/22 198 lb 6.4 oz (90 kg)  03/27/22 198 lb 3.2 oz (89.9 kg)    Physical Exam Vitals and nursing note reviewed.  Constitutional:      General: He is awake. He is not in acute distress.    Appearance: He is well-developed and overweight. He is not ill-appearing.  HENT:     Head: Normocephalic and atraumatic.     Right Ear: Hearing, tympanic membrane, ear canal and external ear normal. No drainage.     Left Ear: Hearing, tympanic membrane, ear canal and external ear normal. No drainage.     Nose: Nose normal.     Mouth/Throat:     Pharynx: Uvula midline.  Eyes:     General: Lids are normal.        Right eye: No discharge.        Left eye: No discharge.     Extraocular Movements: Extraocular movements intact.     Conjunctiva/sclera: Conjunctivae normal.     Pupils: Pupils are equal, round, and reactive to light.     Visual Fields: Right eye visual fields normal and left eye visual fields normal.  Neck:     Thyroid: No thyromegaly.     Vascular: No carotid bruit or JVD.     Trachea: Trachea normal.  Cardiovascular:     Rate and Rhythm: Normal rate and regular rhythm.     Heart sounds: Normal heart sounds, S1 normal and S2 normal. No murmur heard.    No gallop.  Pulmonary:     Effort: Pulmonary effort is normal. No accessory muscle usage or respiratory distress.     Breath sounds: Normal breath sounds.  Abdominal:     General: Bowel sounds are normal.     Palpations:  Abdomen is soft. There is no hepatomegaly or splenomegaly.     Tenderness: There is no abdominal tenderness.  Genitourinary:    Comments: Deferred per patient request. Musculoskeletal:        General: Normal range of motion.     Cervical back: Normal range of motion and neck supple.     Right lower leg: No edema.     Left lower leg: No edema.  Lymphadenopathy:     Head:     Right side of head: No submental, submandibular, tonsillar, preauricular or posterior auricular adenopathy.     Left side of head: No submental, submandibular, tonsillar, preauricular or posterior auricular adenopathy.     Cervical: No cervical adenopathy.  Skin:    General: Skin is warm and dry.     Capillary Refill: Capillary refill takes less than 2 seconds.  Findings: No rash.     Comments: A few scattered pale purple bruises to bilateral upper extremity.  Neurological:     Mental Status: He is alert and oriented to person, place, and time.     Gait: Gait is intact.     Deep Tendon Reflexes: Reflexes are normal and symmetric.     Reflex Scores:      Brachioradialis reflexes are 2+ on the right side and 2+ on the left side.      Patellar reflexes are 2+ on the right side and 2+ on the left side. Psychiatric:        Attention and Perception: Attention normal.        Mood and Affect: Mood normal.        Speech: Speech normal.        Behavior: Behavior normal. Behavior is cooperative.        Thought Content: Thought content normal.        Cognition and Memory: Cognition normal.        Judgment: Judgment normal.     Diabetic Foot Exam - Simple   Simple Foot Form Visual Inspection See comments: Yes Sensation Testing Intact to touch and monofilament testing bilaterally: Yes Pulse Check Posterior Tibialis and Dorsalis pulse intact bilaterally: Yes Comments Xerosis bilateral feet, thick toenails.    Results for orders placed or performed in visit on 06/12/22  HgB A1c  Result Value Ref Range   Hgb  A1c MFr Bld 6.1 (H) 4.8 - 5.6 %   Est. average glucose Bld gHb Est-mCnc 128 mg/dL  Urine Microalbumin w/creat. ratio  Result Value Ref Range   Creatinine, Urine 119.6 Not Estab. mg/dL   Microalbumin, Urine 4.9 Not Estab. ug/mL   Microalb/Creat Ratio 4 0 - 29 mg/g creat  CBC with Differential/Platelet  Result Value Ref Range   WBC 6.2 3.4 - 10.8 x10E3/uL   RBC 4.35 4.14 - 5.80 x10E6/uL   Hemoglobin 14.2 13.0 - 17.7 g/dL   Hematocrit 42.4 37.5 - 51.0 %   MCV 98 (H) 79 - 97 fL   MCH 32.6 26.6 - 33.0 pg   MCHC 33.5 31.5 - 35.7 g/dL   RDW 13.8 11.6 - 15.4 %   Platelets 264 150 - 450 x10E3/uL   Neutrophils 70 Not Estab. %   Lymphs 20 Not Estab. %   Monocytes 7 Not Estab. %   Eos 1 Not Estab. %   Basos 1 Not Estab. %   Neutrophils Absolute 4.4 1.4 - 7.0 x10E3/uL   Lymphocytes Absolute 1.2 0.7 - 3.1 x10E3/uL   Monocytes Absolute 0.4 0.1 - 0.9 x10E3/uL   EOS (ABSOLUTE) 0.1 0.0 - 0.4 x10E3/uL   Basophils Absolute 0.0 0.0 - 0.2 x10E3/uL   Immature Granulocytes 1 Not Estab. %   Immature Grans (Abs) 0.0 0.0 - 0.1 x10E3/uL  Comprehensive metabolic panel  Result Value Ref Range   Glucose 137 (H) 70 - 99 mg/dL   BUN 18 8 - 27 mg/dL   Creatinine, Ser 0.87 0.76 - 1.27 mg/dL   eGFR 88 >59 mL/min/1.73   BUN/Creatinine Ratio 21 10 - 24   Sodium 140 134 - 144 mmol/L   Potassium 4.6 3.5 - 5.2 mmol/L   Chloride 102 96 - 106 mmol/L   CO2 25 20 - 29 mmol/L   Calcium 9.0 8.6 - 10.2 mg/dL   Total Protein 6.0 6.0 - 8.5 g/dL   Albumin 4.1 3.8 - 4.8 g/dL   Globulin, Total 1.9 1.5 -  4.5 g/dL   Albumin/Globulin Ratio 2.2 1.2 - 2.2   Bilirubin Total 0.5 0.0 - 1.2 mg/dL   Alkaline Phosphatase 70 44 - 121 IU/L   AST 17 0 - 40 IU/L   ALT 14 0 - 44 IU/L  Lipid Panel w/o Chol/HDL Ratio  Result Value Ref Range   Cholesterol, Total 112 100 - 199 mg/dL   Triglycerides 128 0 - 149 mg/dL   HDL 43 >39 mg/dL   VLDL Cholesterol Cal 23 5 - 40 mg/dL   LDL Chol Calc (NIH) 46 0 - 99 mg/dL  TSH  Result Value  Ref Range   TSH 2.000 0.450 - 4.500 uIU/mL  VITAMIN D 25 Hydroxy (Vit-D Deficiency, Fractures)  Result Value Ref Range   Vit D, 25-Hydroxy 43.5 30.0 - 100.0 ng/mL  Vitamin B12  Result Value Ref Range   Vitamin B-12 577 232 - 1,245 pg/mL      Assessment & Plan:   Problem List Items Addressed This Visit       Cardiovascular and Mediastinum   Aortic atherosclerosis ([place])    Ongoing.  Noted on CXR 07/20/19.  Continue statin and ASA daily for prevention.  Monitor closely.  Educated patient on this.      Relevant Orders   Comprehensive metabolic panel (Completed)   Lipid Panel w/o Chol/HDL Ratio (Completed)   Hypertension associated with diabetes (HCC)    Chronic, stable.  BP at goal for age in office today, avoid hypotension due to age.  Recommend he monitor BP at home at least three mornings a week + focus on DASH diet.  Continue current medication regimen and adjust as needed.  Labs: CBC, CMP, TSH.  Urine ALB 15 May 2021 - recheck today.  May benefit addition of ACE or ARB in future and transition off BB.      Senile purpura (HCC)    Chronic, stable, noted on exams.  Recommend continue to perform gentle skin care and monitor for skin breakdown, if present notify provider.      Relevant Orders   CBC with Differential/Platelet (Completed)     Endocrine   DM due to underlying condition with diabetic nephropathy ([place]) - Primary    Chronic, ongoing in patient with history of renal cell CA.  A1c trend up to 7.8% recent visit, recheck today.  Did not tolerate Trulicity, GI issues.  Wilder Glade tried with GI issues presenting.  - Continue Metformin 1000 MG BID and Januvia 100 MG daily. Continue low dose Amaryl, which he has taken in past -- 0.5 MG to start and ensure no frequent BS <70, he is aware to notify provider if this presents.  Goal to avoid insulin if possible. - Monitor BS daily in morning at home.  - Eye and foot exam up to date - Return in 3 months, sooner if needed dependent  on labs.      Relevant Orders   HgB A1c (Completed)   Urine Microalbumin w/creat. ratio (Completed)   Comprehensive metabolic panel (Completed)   Hyperlipidemia associated with type 2 diabetes mellitus (HCC)    Chronic, ongoing.  Continue current medication regimen and adjust as needed.  Lipid panel today.        Musculoskeletal and Integument   Rheumatoid arthritis of multiple sites with negative rheumatoid factor (HCC)    Chronic, ongoing.  Followed by rheumatology with improved functional status.  Continue this collaboration and current regimen as prescribed by them.  Recent note reviewed.  Discussed that he can take  occasional Tylenol as needed for back pain, max dosing 3000 MG daily.      Relevant Medications   methotrexate (RHEUMATREX) 2.5 MG tablet   Other Relevant Orders   CBC with Differential/Platelet (Completed)   Comprehensive metabolic panel (Completed)     Genitourinary   BPH with obstruction/lower urinary tract symptoms    Chronic, ongoing.  Continue current medication regimen and collaboration with urology.  Recent note reviewed.  Monitor symptoms with addition of SGLT2 for diabetes.        Other   B12 deficiency    Ongoing and improving with supplement.  Recheck today and consider reducing or discontinuing supplement as needed.      Relevant Orders   CBC with Differential/Platelet (Completed)   Vitamin B12 (Completed)   Elevated PSA    Continue collaboration with urology.  Recent note reviewed.  PSA monitored by them.        Personal history of renal cancer    Followed by urology, continue this collaboration.  Recent notes reviewed.      Vitamin D deficiency    Ongoing.  Continue daily supplement and recheck level today, recent was stable.      Relevant Orders   VITAMIN D 25 Hydroxy (Vit-D Deficiency, Fractures) (Completed)   Other Visit Diagnoses     Encounter for annual physical exam       Annual physical today with labs and health maintenance  reviewed, discussed with patient.        Discussed aspirin prophylaxis for myocardial infarction prevention and decision was  refuses  LABORATORY TESTING:  Health maintenance labs ordered today as discussed above.   The natural history of prostate cancer and ongoing controversy regarding screening and potential treatment outcomes of prostate cancer has been discussed with the patient. The meaning of a false positive PSA and a false negative PSA has been discussed. He indicates understanding of the limitations of this screening test and wishes  to proceed with screening PSA testing -- followed by urology.   IMMUNIZATIONS:   - Tdap: Tetanus vaccination status reviewed: needed, obtain next visit - Influenza: Up to date - Pneumovax: Up to date - Prevnar: Refused - Zostavax vaccine: will think about this and alert provider if requested  SCREENING: - Colonoscopy: Not applicable  Discussed with patient purpose of the colonoscopy is to detect colon cancer at curable precancerous or early stages   - AAA Screening: Not applicable  -Hearing Test: Not applicable  -Spirometry: Not applicable   PATIENT COUNSELING:    Sexuality: Discussed sexually transmitted diseases, partner selection, use of condoms, avoidance of unintended pregnancy  and contraceptive alternatives.   Advised to avoid cigarette smoking.  I discussed with the patient that most people either abstain from alcohol or drink within safe limits (<=14/week and <=4 drinks/occasion for males, <=7/weeks and <= 3 drinks/occasion for females) and that the risk for alcohol disorders and other health effects rises proportionally with the number of drinks per week and how often a drinker exceeds daily limits.  Discussed cessation/primary prevention of drug use and availability of treatment for abuse.   Diet: Encouraged to adjust caloric intake to maintain  or achieve ideal body weight, to reduce intake of dietary saturated fat and total  fat, to limit sodium intake by avoiding high sodium foods and not adding table salt, and to maintain adequate dietary potassium and calcium preferably from fresh fruits, vegetables, and low-fat dairy products.    Stressed the importance of regular exercise  Injury prevention:  Discussed safety belts, safety helmets, smoke detector, smoking near bedding or upholstery.   Dental health: Discussed importance of regular tooth brushing, flossing, and dental visits.   Follow up plan: NEXT PREVENTATIVE PHYSICAL DUE IN 1 YEAR. Return in about 3 months (around 09/10/2022) for T2DM, HTN/HLD, RA, BPH.

## 2022-06-12 NOTE — Assessment & Plan Note (Signed)
Chronic, ongoing in patient with history of renal cell CA.  A1c trend up to 7.8% recent visit, recheck today.  Did not tolerate Trulicity, GI issues.  Wilder Glade tried with GI issues presenting.  - Continue Metformin 1000 MG BID and Januvia 100 MG daily. Continue low dose Amaryl, which he has taken in past -- 0.5 MG to start and ensure no frequent BS <70, he is aware to notify provider if this presents.  Goal to avoid insulin if possible. - Monitor BS daily in morning at home.  - Eye and foot exam up to date - Return in 3 months, sooner if needed dependent on labs.

## 2022-06-12 NOTE — Assessment & Plan Note (Signed)
Ongoing.  Noted on CXR 07/20/19.  Continue statin and ASA daily for prevention.  Monitor closely.  Educated patient on this.

## 2022-06-12 NOTE — Assessment & Plan Note (Signed)
Chronic, ongoing.  Followed by rheumatology with improved functional status.  Continue this collaboration and current regimen as prescribed by them.  Recent note reviewed.  Discussed that he can take occasional Tylenol as needed for back pain, max dosing 3000 MG daily.

## 2022-06-12 NOTE — Assessment & Plan Note (Signed)
Chronic, stable.  BP at goal for age in office today, avoid hypotension due to age.  Recommend he monitor BP at home at least three mornings a week + focus on DASH diet.  Continue current medication regimen and adjust as needed.  Labs: CBC, CMP, TSH.  Urine ALB 15 May 2021 - recheck today.  May benefit addition of ACE or ARB in future and transition off BB.

## 2022-06-12 NOTE — Assessment & Plan Note (Signed)
Continue collaboration with urology.  Recent note reviewed.  PSA monitored by them.

## 2022-06-12 NOTE — Assessment & Plan Note (Signed)
Ongoing.  Continue daily supplement and recheck level today, recent was stable.

## 2022-06-12 NOTE — Assessment & Plan Note (Signed)
Ongoing and improving with supplement.  Recheck today and consider reducing or discontinuing supplement as needed.

## 2022-06-12 NOTE — Assessment & Plan Note (Signed)
Chronic, stable, noted on exams.  Recommend continue to perform gentle skin care and monitor for skin breakdown, if present notify provider.

## 2022-06-12 NOTE — Assessment & Plan Note (Signed)
Followed by urology, continue this collaboration.  Recent notes reviewed.

## 2022-06-12 NOTE — Assessment & Plan Note (Signed)
Chronic, ongoing.  Continue current medication regimen and collaboration with urology.  Recent note reviewed.  Monitor symptoms with addition of SGLT2 for diabetes.

## 2022-06-12 NOTE — Assessment & Plan Note (Signed)
Chronic, ongoing. Continue current medication regimen and adjust as needed.  Lipid panel today. 

## 2022-06-13 NOTE — Progress Notes (Signed)
Contacted via [place] afternoon Dustin, your labs have returned: - A1c is trending down!!  Woohoo!!  Was 7.8% and now 6.1% -- great job!!  No medication changes needed at this time for diabetes.  Continue current medications. - CBC shows no anemia or infection - Kidney function, creatinine and eGFR, remains normal, as is liver function, AST and ALT.  - Lipid panel shows goal levels.  Continue statin therapy. - Remainder of labs all stable. No changes needed. Keep being amazing!!  Thank you for allowing me to participate in your care.  I appreciate you. Kindest regards, Marios Gaiser

## 2022-06-14 LAB — LIPID PANEL W/O CHOL/HDL RATIO
Cholesterol, Total: 112 mg/dL (ref 100–199)
HDL: 43 mg/dL (ref >39)
LDL Chol Calc (NIH): 46 mg/dL (ref 0–99)
Triglycerides: 128 mg/dL (ref 0–149)
VLDL Cholesterol Cal: 23 mg/dL (ref 5–40)

## 2022-06-14 LAB — COMPREHENSIVE METABOLIC PANEL
ALT: 14 IU/L (ref 0–44)
AST: 17 IU/L (ref 0–40)
Albumin/Globulin Ratio: 2.2 (ref 1.2–2.2)
Albumin: 4.1 g/dL (ref 3.8–4.8)
Alkaline Phosphatase: 70 IU/L (ref 44–121)
BUN/Creatinine Ratio: 21 (ref 10–24)
BUN: 18 mg/dL (ref 8–27)
Bilirubin Total: 0.5 mg/dL (ref 0.0–1.2)
CO2: 25 mmol/L (ref 20–29)
Calcium: 9 mg/dL (ref 8.6–10.2)
Chloride: 102 mmol/L (ref 96–106)
Creatinine, Ser: 0.87 mg/dL (ref 0.76–1.27)
Globulin, Total: 1.9 g/dL (ref 1.5–4.5)
Glucose: 137 mg/dL — ABNORMAL HIGH (ref 70–99)
Potassium: 4.6 mmol/L (ref 3.5–5.2)
Sodium: 140 mmol/L (ref 134–144)
Total Protein: 6 g/dL (ref 6.0–8.5)
eGFR: 88 mL/min/{1.73_m2} (ref >59)

## 2022-06-14 LAB — CBC WITH DIFFERENTIAL/PLATELET
Basophils Absolute: 0 10*3/uL (ref 0.0–0.2)
Basos: 1 %
EOS (ABSOLUTE): 0.1 10*3/uL (ref 0.0–0.4)
Eos: 1 %
Hematocrit: 42.4 % (ref 37.5–51.0)
Hemoglobin: 14.2 g/dL (ref 13.0–17.7)
Immature Grans (Abs): 0 10*3/uL (ref 0.0–0.1)
Immature Granulocytes: 1 %
Lymphocytes Absolute: 1.2 10*3/uL (ref 0.7–3.1)
Lymphs: 20 %
MCH: 32.6 pg (ref 26.6–33.0)
MCHC: 33.5 g/dL (ref 31.5–35.7)
MCV: 98 fL — ABNORMAL HIGH (ref 79–97)
Monocytes Absolute: 0.4 10*3/uL (ref 0.1–0.9)
Monocytes: 7 %
Neutrophils Absolute: 4.4 10*3/uL (ref 1.4–7.0)
Neutrophils: 70 %
Platelets: 264 10*3/uL (ref 150–450)
RBC: 4.35 x10E6/uL (ref 4.14–5.80)
RDW: 13.8 % (ref 11.6–15.4)
WBC: 6.2 10*3/uL (ref 3.4–10.8)

## 2022-06-14 LAB — TSH: TSH: 2 u[IU]/mL (ref 0.450–4.500)

## 2022-06-14 LAB — MICROALBUMIN / CREATININE URINE RATIO
Creatinine, Urine: 119.6 mg/dL
Microalb/Creat Ratio: 4 mg/g creat (ref 0–29)
Microalbumin, Urine: 4.9 ug/mL

## 2022-06-14 LAB — HEMOGLOBIN A1C
Est. average glucose Bld gHb Est-mCnc: 128 mg/dL
Hgb A1c MFr Bld: 6.1 % — ABNORMAL HIGH (ref 4.8–5.6)

## 2022-06-14 LAB — VITAMIN D 25 HYDROXY (VIT D DEFICIENCY, FRACTURES): Vit D, 25-Hydroxy: 43.5 ng/mL (ref 30.0–100.0)

## 2022-06-14 LAB — VITAMIN B12: Vitamin B-12: 577 pg/mL (ref 232–1245)

## 2022-06-17 ENCOUNTER — Telehealth: Payer: Self-pay | Admitting: *Deleted

## 2022-06-17 NOTE — Progress Notes (Signed)
  Care Coordination Note  06/17/2022 Name: MATHIAS BOGACKI MRN: [id number] DOB: April 11, 1941  Jason Contreras is a 82 y.o. year old male who is a primary care patient of Venita Lick, NP and is actively engaged with the care management team. I reached out to Jason Contreras by phone today to assist with re-scheduling a follow up visit with the RN Case Manager  Follow up plan: Unsuccessful telephone outreach attempt made. A HIPAA compliant phone message was left for the patient providing contact information and requesting a return call.   Julian Hy, [place] Direct Dial: [phone]

## 2022-06-24 ENCOUNTER — Telehealth: Payer: Medicare Other

## 2022-06-24 NOTE — Progress Notes (Signed)
  Care Coordination Note  06/24/2022 Name: Jason Contreras MRN: [id number] DOB: Nov 22, 1940  Jason Contreras is a 82 y.o. year old male who is a primary care patient of Venita Lick, NP and is actively engaged with the care management team. I reached out to Jason Contreras by phone today to assist with re-scheduling a follow up visit with the RN Case Manager  Follow up plan: Telephone appointment with care management team member scheduled for: 07/11/2022  Julian Hy, [place] Direct Dial: [phone]

## 2022-07-11 ENCOUNTER — Ambulatory Visit: Payer: Self-pay | Admitting: *Deleted

## 2022-07-11 NOTE — Patient Outreach (Signed)
  Care Coordination   Follow Up Visit Note   07/11/2022 Name: Jason Contreras MRN: [id number] DOB: 07-23-1940  Jason Contreras is a 82 y.o. year old male who sees [place], Henrine Screws T, NP for primary care. I spoke with  Jason Contreras by phone today.  What matters to the patients health and wellness today?  Patient reports excitement regarding decreased A1C, will continue to keep under control.  State he is out of methotrexate, has not received it from mail order pharmacy.  PCP called, requested refill be sent to local pharmacy as he is due tomorrow.  Also called rheumatology to request full prescription be sent to [facility].     Goals Addressed             This Visit's Progress    COMPLETED: RNCM: Effective Management of DM   On track    Care Coordination Interventions: Lab Results  Component Value Date   HGBA1C 6.1 (H) 06/12/2022    Provided education to patient about basic DM disease process Reviewed medications with patient and discussed importance of medication adherence.   Counseled on importance of regular laboratory monitoring as prescribed Discussed plans with patient for ongoing care management follow up and provided patient with direct contact information for care management team Provided patient with written educational materials related to hypo and hyperglycemia and importance of correct treatment.  Blood sugars range 70s-150.  110 and 106 today.  Discussed hypoglycemic treatment, advised to have glucose tabs and peanut butter crackers on hand versus candy Reviewed scheduled/upcoming provider appointments including: PCP on 5/15 Advised patient, providing education and rationale, to check cbg as directed  and record, calling pcp for findings outside established parameters. The patient is not checking his blood sugars. Review of the benefits of checking blood sugars and the patient agrees to start checking periodically. Education and support given.  Review of patient status, including  review of consultants reports, relevant laboratory and other test results, and medications completed Discussed change in diet to increase vegetables and decrease carbs.         SDOH assessments and interventions completed:  No     Care Coordination Interventions:  Yes, provided   Interventions Today    Flowsheet Row Most Recent Value  Chronic Disease   Chronic disease during today's visit Diabetes  General Interventions   General Interventions Discussed/Reviewed General Interventions Reviewed, Doctor Visits, Labs  Labs Hgb A1c every 3 months  Doctor Visits Discussed/Reviewed Doctor Visits Reviewed, PCP  PCP/Specialist Visits Compliance with follow-up visit        Follow up plan: Follow up call scheduled for 3/19    Encounter Outcome:  Pt. Visit Completed   Valente David, RN, MSN, [place] Care Management Care Management Coordinator [phone]

## 2022-07-11 NOTE — Patient Instructions (Signed)
Visit Information  Thank you for taking time to visit with me today. Please Ashaun't hesitate to contact me if I can be of assistance to you before our next scheduled telephone appointment.  Following are the goals we discussed today:  Call [facility] to follow up on new prescription for Methotrexate. Continue taking medications and monitoring blood sugar daily.  Eat peanut butter crackers or juice when hypoglycemic.   Our next appointment is by telephone on 3/19  Please call the care guide team at [phone] if you need to cancel or reschedule your appointment.   Please call the Suicide and Crisis Lifeline: 988 call the [place] National Suicide Prevention Lifeline: 531-242-3167 or TTY: 360 601 9253 TTY 863-325-7300) to talk to a trained counselor call 1-800-273-TALK (toll free, 24 hour hotline) call 911 if you are experiencing a Mental Health or [place] or need someone to talk to.  Patient verbalizes understanding of instructions and care plan provided today and agrees to view in [place]. Active MyChart status and patient understanding of how to access instructions and care plan via MyChart confirmed with patient.     The patient has been provided with contact information for the care management team and has been advised to call with any health related questions or concerns.   [place] Management Care Management Coordinator [phone]

## 2022-07-12 ENCOUNTER — Other Ambulatory Visit: Payer: Self-pay | Admitting: Nurse Practitioner

## 2022-07-12 MED ORDER — METHOTREXATE SODIUM 2.5 MG PO TABS: ORAL_TABLET | ORAL | 1 refills | Status: DC

## 2022-07-23 ENCOUNTER — Ambulatory Visit: Payer: Self-pay | Admitting: *Deleted

## 2022-07-23 NOTE — Patient Outreach (Signed)
  Care Coordination   Follow Up Visit Note   07/23/2022 Name: Jason Contreras MRN: [id number] DOB: [birth year]  Jason Contreras is a 82 y.o. year old male who sees Jason Contreras, Jason Screws T, NP for primary care. I spoke with Jason Contreras, wife of Jason Contreras by phone today.  What matters to the patients health and wellness today?  Follow up call was placed to confirm patient has received methotrexate from pharmacy.  Wife state it was received on Friday.  Denies any urgent concerns, encouraged to contact this care manager with questions.      SDOH assessments and interventions completed:  No     Care Coordination Interventions:  Yes, provided   Follow up plan: No further intervention required.   Encounter Outcome:  Pt. Visit Completed   Jason David, RN, MSN, [place] Care Management Care Management Coordinator [phone]

## 2022-07-25 ENCOUNTER — Other Ambulatory Visit: Payer: Self-pay | Admitting: Nurse Practitioner

## 2022-07-25 DIAGNOSIS — E0821 Diabetes mellitus due to underlying condition with diabetic nephropathy: Secondary | ICD-10-CM

## 2022-07-26 NOTE — Telephone Encounter (Signed)
Request change to #[phone] for max insurance benefit Requested Prescriptions  Pending Prescriptions Disp Refills   gabapentin (NEURONTIN) 600 MG tablet [Pharmacy Med Name: Gabapentin 600 MG Oral Tablet] 300 tablet 2    Sig: TAKE 1 TABLET BY MOUTH 3 TIMES  DAILY     Neurology: Anticonvulsants - gabapentin Passed - 07/25/2022 10:41 PM      Passed - Cr in normal range and within 360 days    Creatinine, Ser  Date Value Ref Range Status  06/12/2022 0.87 0.76 - 1.27 mg/dL Final         Passed - Completed PHQ-2 or PHQ-9 in the last 360 days      Passed - Valid encounter within last 12 months    Recent Outpatient Visits           1 month ago Diabetes mellitus due to underlying condition with diabetic nephropathy, without long-term current use of insulin ([place])   [place], [place] T, NP   2 months ago Subacute cough   [place], [place] T, NP   3 months ago Generalized abdominal pain   [place], [place] T, NP   4 months ago Diabetes mellitus due to underlying condition with diabetic nephropathy, without long-term current use of insulin ([place])   [place], [place] T, NP   4 months ago Diabetes mellitus due to underlying condition with diabetic nephropathy, without long-term current use of insulin ([place])   [place], [place] T, NP       Future Appointments             In 1 month [name], [place] T, NP [place], [facility]             metFORMIN (GLUCOPHAGE) 500 MG tablet [Pharmacy Med Name: metFORMIN HCl 500 MG Oral Tablet] 400 tablet 2    Sig: TAKE 2 TABLETS BY MOUTH TWICE  DAILY     Endocrinology:  Diabetes - Biguanides Passed - 07/25/2022 10:41 PM      Passed - Cr in normal range and within 360 days    Creatinine, Ser  Date Value Ref Range Status  06/12/2022 0.87 0.76 - 1.27 mg/dL Final          Passed - HBA1C is between 0 and 7.9 and within 180 days    Hemoglobin A1C  Date Value Ref Range Status  12/27/2015 6.3  Final   HB A1C (BAYER DCA - WAIVED)  Date Value Ref Range Status  03/06/2022 7.8 (H) 4.8 - 5.6 % Final    Comment:             Prediabetes: 5.7 - 6.4          Diabetes: >6.4          Glycemic control for adults with diabetes: <7.0    Hgb A1c MFr Bld  Date Value Ref Range Status  06/12/2022 6.1 (H) 4.8 - 5.6 % Final    Comment:             Prediabetes: 5.7 - 6.4          Diabetes: >6.4          Glycemic control for adults with diabetes: <7.0          Passed - eGFR in normal range and within 360 days    GFR calc Af Wy[place]nia Lora  Date Value Ref  Range Status  06/02/2020 99 >59 mL/min/1.73 Final    Comment:    **In accordance with recommendations from the NKF-ASN Task force,**   [facility] is in the process of updating its eGFR calculation to the   2021 CKD-EPI creatinine equation that estimates kidney function   without a race variable.    GFR calc non Af Amer  Date Value Ref Range Status  06/02/2020 85 >59 mL/min/1.73 Final   eGFR  Date Value Ref Range Status  06/12/2022 88 >59 mL/min/1.73 Final         Passed - B12 Level in normal range and within 720 days    Vitamin B-12  Date Value Ref Range Status  06/12/2022 577 232 - 1,245 pg/mL Final         Passed - Valid encounter within last 6 months    Recent Outpatient Visits           1 month ago Diabetes mellitus due to underlying condition with diabetic nephropathy, without long-term current use of insulin ([place])   [place], [place] T, NP   2 months ago Subacute cough   [place], [place] T, NP   3 months ago Generalized abdominal pain   [place], [place] T, NP   4 months ago Diabetes mellitus due to underlying condition with diabetic nephropathy, without long-term current use of insulin ([place])    [place], [place] T, NP   4 months ago Diabetes mellitus due to underlying condition with diabetic nephropathy, without long-term current use of insulin ([place])   [place], Henrine Screws T, NP       Future Appointments             In 1 month [place], [place] T, NP [place], [facility]            Passed - CBC within normal limits and completed in the last 12 months    WBC  Date Value Ref Range Status  06/12/2022 6.2 3.4 - 10.8 x10E3/uL Final  10/14/2017 5.8 4.0 - 10.5 K/uL Final   RBC  Date Value Ref Range Status  06/12/2022 4.35 4.14 - 5.80 x10E6/uL Final  10/14/2017 4.36 4.22 - 5.81 MIL/uL Final   Hemoglobin  Date Value Ref Range Status  06/12/2022 14.2 13.0 - 17.7 g/dL Final   Hematocrit  Date Value Ref Range Status  06/12/2022 42.4 37.5 - 51.0 % Final   MCHC  Date Value Ref Range Status  06/12/2022 33.5 31.5 - 35.7 g/dL Final  10/14/2017 33.2 30.0 - 36.0 g/dL Final   [facility]  Date Value Ref Range Status  06/12/2022 32.6 26.6 - 33.0 pg Final  10/14/2017 32.3 26.0 - 34.0 pg Final   MCV  Date Value Ref Range Status  06/12/2022 98 (H) 79 - 97 fL Final   No results found for: "PLTCOUNTKUC", "LABPLAT", "POCPLA" RDW  Date Value Ref Range Status  06/12/2022 13.8 11.6 - 15.4 % Final

## 2022-08-01 DIAGNOSIS — Z79899 Other long term (current) drug therapy: Secondary | ICD-10-CM | POA: Diagnosis not present

## 2022-08-01 DIAGNOSIS — M159 Polyosteoarthritis, unspecified: Secondary | ICD-10-CM | POA: Diagnosis not present

## 2022-08-01 DIAGNOSIS — M0609 Rheumatoid arthritis without rheumatoid factor, multiple sites: Secondary | ICD-10-CM | POA: Diagnosis not present

## 2022-08-26 ENCOUNTER — Telehealth: Payer: Self-pay | Admitting: Nurse Practitioner

## 2022-08-26 NOTE — Telephone Encounter (Signed)
Patient states that he would like to have it sent there.

## 2022-08-26 NOTE — Telephone Encounter (Unsigned)
Copied from CRM [id number]. Topic: General - Other >> Aug 26, 2022  2:54 PM Franchot Heidelberg wrote: Reason for CRM: Pt called back reporting that he was able to find a pharmacy that has trulicity in [place]. [facility] pharmacy at [facility] has it. >> Aug 26, 2022  2:59 PM Franchot Heidelberg wrote: Requesting a call back from the clinic  [phone]

## 2022-08-27 NOTE — Telephone Encounter (Signed)
Patient would like to remain on Trulicity and have it sent to [facility] at [facility]

## 2022-09-15 NOTE — Patient Instructions (Signed)
Be Involved in Your Health Care:  Taking Medications When medications are taken as directed, they can greatly improve your health. But if they are not taken as instructed, they may not work. In some cases, not taking them correctly can be harmful. To help ensure your treatment remains effective and safe, understand your medications and how to take them.  Your lab results, notes and after visit summary will be available on My Chart. We strongly encourage you to use this feature. If lab results are abnormal the clinic will contact you with the appropriate steps. If the clinic does not contact you assume the results are satisfactory. You can always see your results on My Chart. If you have questions regarding your condition, please contact the clinic during office hours. You can also ask questions on My Chart.  We at [facility] are grateful that you chose us to provide care. We strive to provide excellent and compassionate care and are always looking for feedback. If you get a survey from the clinic please complete this.   Diabetes Mellitus Basics  Diabetes mellitus, or diabetes, is a long-term (chronic) disease. It occurs when the body does not properly use sugar (glucose) that is released from food after you eat. Diabetes mellitus may be caused by one or both of these problems: Your pancreas does not make enough of a hormone called insulin. Your body does not react in a normal way to the insulin that it makes. Insulin lets glucose enter cells in your body. This gives you energy. If you have diabetes, glucose cannot get into cells. This causes high blood glucose (hyperglycemia). How to treat and manage diabetes You may need to take insulin or other diabetes medicines daily to keep your glucose in balance. If you are prescribed insulin, you will learn how to give yourself insulin by injection. You may need to adjust the amount of insulin you take based on the foods that you eat. You will  need to check your blood glucose levels using a glucose monitor as told by your health care provider. The readings can help determine if you have low or high blood glucose. Generally, you should have these blood glucose levels: Before meals (preprandial): 80-130 mg/dL (4.4-7.2 mmol/L). After meals (postprandial): below 180 mg/dL (10 mmol/L). Hemoglobin A1c (HbA1c) level: less than 7%. Your health care provider will set treatment goals for you. Keep all follow-up visits. This is important. Follow these instructions at home: Diabetes medicines Take your diabetes medicines every day as told by your health care provider. List your diabetes medicines here: Name of medicine: ______________________________ Amount (dose): _______________ Time (a.m./p.m.): _______________ Notes: ___________________________________ Name of medicine: ______________________________ Amount (dose): _______________ Time (a.m./p.m.): _______________ Notes: ___________________________________ Name of medicine: ______________________________ Amount (dose): _______________ Time (a.m./p.m.): _______________ Notes: ___________________________________ Insulin If you use insulin, list the types of insulin you use here: Insulin type: ______________________________ Amount (dose): _______________ Time (a.m./p.m.): _______________Notes: ___________________________________ Insulin type: ______________________________ Amount (dose): _______________ Time (a.m./p.m.): _______________ Notes: ___________________________________ Insulin type: ______________________________ Amount (dose): _______________ Time (a.m./p.m.): _______________ Notes: ___________________________________ Insulin type: ______________________________ Amount (dose): _______________ Time (a.m./p.m.): _______________ Notes: ___________________________________ Insulin type: ______________________________ Amount (dose): _______________ Time (a.m./p.m.): _______________  Notes: ___________________________________ Managing blood glucose  Check your blood glucose levels using a glucose monitor as told by your health care provider. Write down the times that you check your glucose levels here: Time: _______________ Notes: ___________________________________ Time: _______________ Notes: ___________________________________ Time: _______________ Notes: ___________________________________ Time: _______________ Notes: ___________________________________ Time: _______________ Notes: ___________________________________ Time: _______________ Notes: ___________________________________    Low blood glucose Low blood glucose (hypoglycemia) is when glucose is at or below 70 mg/dL (3.9 mmol/L). Symptoms may include: Feeling: Hungry. Sweaty and clammy. Irritable or easily upset. Dizzy. Sleepy. Having: A fast heartbeat. A headache. A change in your vision. Numbness around the mouth, lips, or tongue. Having trouble with: Moving (coordination). Sleeping. Treating low blood glucose To treat low blood glucose, eat or drink something containing sugar right away. If you can think clearly and swallow safely, follow the 15:15 rule: Take 15 grams of a fast-acting carb (carbohydrate), as told by your health care provider. Some fast-acting carbs are: Glucose tablets: take 3-4 tablets. Hard candy: eat 3-5 pieces. Fruit juice: drink 4 oz (120 mL). Regular (not diet) soda: drink 4-6 oz (120-180 mL). Honey or sugar: eat 1 Tbsp (15 mL). Check your blood glucose levels 15 minutes after you take the carb. If your glucose is still at or below 70 mg/dL (3.9 mmol/L), take 15 grams of a carb again. If your glucose does not go above 70 mg/dL (3.9 mmol/L) after 3 tries, get help right away. After your glucose goes back to normal, eat a meal or a snack within 1 hour. Treating very low blood glucose If your glucose is at or below 54 mg/dL (3 mmol/L), you have very low blood glucose  (severe hypoglycemia). This is an emergency. Do not wait to see if the symptoms will go away. Get medical help right away. Call your local emergency services (911 in the [place] Do not drive yourself to the hospital. Questions to ask your health care provider Should I talk with a diabetes educator? What equipment will I need to care for myself at home? What diabetes medicines do I need? When should I take them? How often do I need to check my blood glucose levels? What number can I call if I have questions? When is my follow-up visit? Where can I find a support group for people with diabetes? Where to find more information American Diabetes Association: www.diabetes.org Association of Diabetes Care and Education Specialists: www.diabeteseducator.org Contact a health care provider if: Your blood glucose is at or above 240 mg/dL (13.3 mmol/L) for 2 days in a row. You have been sick or have had a fever for 2 days or more, and you are not getting better. You have any of these problems for more than 6 hours: You cannot eat or drink. You feel nauseous. You vomit. You have diarrhea. Get help right away if: Your blood glucose is lower than 54 mg/dL (3 mmol/L). You get confused. You have trouble thinking clearly. You have trouble breathing. These symptoms may represent a serious problem that is an emergency. Do not wait to see if the symptoms will go away. Get medical help right away. Call your local emergency services (911 in the [place] Do not drive yourself to the hospital. Summary Diabetes mellitus is a chronic disease that occurs when the body does not properly use sugar (glucose) that is released from food after you eat. Take insulin and diabetes medicines as told. Check your blood glucose every day, as often as told. Keep all follow-up visits. This is important. This information is not intended to replace advice given to you by your health care provider. Make sure you discuss any  questions you have with your health care provider. Document Revised: 08/24/2019 Document Reviewed: 08/24/2019 Elsevier Patient Education  2023 [employer] Inc.  

## 2022-09-16 ENCOUNTER — Other Ambulatory Visit: Payer: Self-pay

## 2022-09-16 ENCOUNTER — Other Ambulatory Visit: Payer: Medicare Other

## 2022-09-16 DIAGNOSIS — N138 Other obstructive and reflux uropathy: Secondary | ICD-10-CM

## 2022-09-17 ENCOUNTER — Ambulatory Visit: Payer: Medicare Other | Admitting: Urology

## 2022-09-17 ENCOUNTER — Encounter: Payer: Self-pay | Admitting: Urology

## 2022-09-17 VITALS — BP 154/79 | HR 79 | Ht 67.99 in | Wt 205.0 lb

## 2022-09-17 DIAGNOSIS — R972 Elevated prostate specific antigen [PSA]: Secondary | ICD-10-CM

## 2022-09-17 DIAGNOSIS — R339 Retention of urine, unspecified: Secondary | ICD-10-CM

## 2022-09-17 DIAGNOSIS — N401 Enlarged prostate with lower urinary tract symptoms: Secondary | ICD-10-CM | POA: Diagnosis not present

## 2022-09-17 DIAGNOSIS — N138 Other obstructive and reflux uropathy: Secondary | ICD-10-CM

## 2022-09-17 DIAGNOSIS — Z85528 Personal history of other malignant neoplasm of kidney: Secondary | ICD-10-CM | POA: Diagnosis not present

## 2022-09-17 LAB — BLADDER SCAN AMB NON-IMAGING: Scan Result: 166

## 2022-09-17 LAB — PSA: Prostate Specific Ag, Serum: 4.1 ng/mL — ABNORMAL HIGH (ref 0.0–4.0)

## 2022-09-17 NOTE — Progress Notes (Signed)
I, Jason Contreras, acting as a scribe for Jason Scotland, MD.,have documented all relevant documentation on the behalf of Jason Didomenico, MD,as directed by  Jason Scotland, MD while in the presence of Jason Scotland, MD.  09/17/2022 1:01 PM   Jason Contreras [birth year] [id number]  Referring provider: Marjie Skiff, NP [address] [place],  [place] [address]  Chief Complaint  Patient presents with   Benign Prostatic Hypertrophy    HPI: 82 year-old male with a personal history of BPH, elevated PSA, and renal cell carcinoma presents today for annual follow-up.   He is status post negative prostate biopsy in 2017. He had a prostate MRI in 2021 with a PI-RADS 5 lesion. Repeat prostate biopsy was negative and his PSA stabilized.   He's status post partial nephrectomy in 2017 for a 1.8 centimeter papillary renal cell carcinoma followed by cryoablation of a clear cell carcinoma in 2019 for secondary lesion.  His most recent PSA on 09/16/2022 was 4.1.  He is doing well overall with no new symptoms or complaints.    Results for orders placed or performed in visit on 09/17/22  Bladder Scan (Post Void Residual) in office  Result Value Ref Range   Scan Result 166      IPSS     Row Name 09/17/22 0900         International Prostate Symptom Score   How often have you had the sensation of not emptying your bladder? Not at All     How often have you had to urinate less than every two hours? Not at All     How often have you found you stopped and started again several times when you urinated? Not at All     How often have you found it difficult to postpone urination? Not at All     How often have you had a weak urinary stream? Not at All     How often have you had to strain to start urination? Not at All     How many times did you typically get up at night to urinate? 1 Time     Total IPSS Score 1       Quality of Life due to urinary symptoms   If you were to spend the rest of your life  with your urinary condition just the way it is now how would you feel about that? Mostly Satisfied             Score:  1-7 Mild 8-19 Moderate 20-35 Severe   PMH: Past Medical History:  Diagnosis Date   Adenocarcinoma, renal cell (HCC)    Arthritis    Collagen vascular disease (HCC)    Diabetes mellitus without complication (HCC)    Elevated PSA    Headache    Hematuria    Hyperlipidemia    Hypertension    Neuropathy    Obesity    PONV (postoperative nausea and vomiting)    Renal insufficiency    Right renal mass     Surgical History: Past Surgical History:  Procedure Laterality Date   APPENDECTOMY     CRYOABLATION  10/17/2017   IR RADIOLOGIST EVAL & MGMT  07/17/2017   IR RADIOLOGIST EVAL & MGMT  08/13/2017   IR RADIOLOGIST EVAL & MGMT  11/12/2017   IR RADIOLOGIST EVAL & MGMT  03/05/2018   RADIOLOGY WITH ANESTHESIA Right 10/17/2017   Procedure: CT WITH ANESTHESIA RENAL CRYOABLATION;  Surgeon: Berdine Dance, MD;  Location: [facility];  Service: Anesthesiology;  Laterality: Right;   ROBOTIC ASSITED PARTIAL NEPHRECTOMY Right 12/12/2015   Procedure: ROBOTIC ASSITED PARTIAL NEPHRECTOMY;  Surgeon: Jason Scotland, MD;  Location: [facility];  Service: Urology;  Laterality: Right;    Home Medications:  Allergies as of 09/17/2022       Reactions   Dapagliflozin Nausea And Vomiting   Made him really sick   Morphine Nausea And Vomiting   Trulicity [dulaglutide] Nausea And Vomiting        Medication List        Accurate as of Sep 17, 2022  1:01 PM. If you have any questions, ask your nurse or doctor.          STOP taking these medications    albuterol 108 (90 Base) MCG/ACT inhaler Commonly known as: VENTOLIN HFA Stopped by: Jason Scotland, MD       TAKE these medications    acetaminophen 325 MG tablet Commonly known as: TYLENOL Take 650 mg by mouth every 6 (six) hours as needed. Takes at bedtime and occasionally in am for back/hip pain   ascorbic acid 500 MG  tablet Commonly known as: VITAMIN C Take 500 mg by mouth daily.   atorvastatin 20 MG tablet Commonly known as: LIPITOR TAKE 1 TABLET BY MOUTH  DAILY AT 6 PM.   carvedilol 6.25 MG tablet Commonly known as: COREG Take 1 tablet (6.25 mg total) by mouth 2 (two) times daily.   cholecalciferol 25 MCG (1000 UNIT) tablet Commonly known as: VITAMIN D3 Take 2,000 Units by mouth daily.   DULoxetine 60 MG capsule Commonly known as: CYMBALTA Take 1 capsule (60 mg total) by mouth daily.   finasteride 5 MG tablet Commonly known as: PROSCAR Take 1 tablet (5 mg total) by mouth daily.   folic acid 1 MG tablet Commonly known as: FOLVITE Take 1 tablet (1 mg total) by mouth daily.   gabapentin 600 MG tablet Commonly known as: NEURONTIN TAKE 1 TABLET BY MOUTH 3 TIMES  DAILY   glimepiride 1 MG tablet Commonly known as: AMARYL Take 0.5 tablets (0.5 mg total) by mouth daily with breakfast.   lidocaine 4 % cream Commonly known as: LMX Apply 1 application topically as needed.   metFORMIN 500 MG tablet Commonly known as: GLUCOPHAGE TAKE 2 TABLETS BY MOUTH TWICE  DAILY   methotrexate 2.5 MG tablet Commonly known as: RHEUMATREX Take 6 of the 2.5 MG tablets by mouth once a week per rheumatology instruction.  Caution:Chemotherapy. Protect from light.   mometasone 0.1 % cream Commonly known as: ELOCON APPLY  CREAM TOPICALLY ONCE DAILY   sitaGLIPtin 100 MG tablet Commonly known as: Januvia Take 1 tablet (100 mg total) by mouth daily.   SUPER B COMPLEX/FA/VIT C PO Take 1 tablet by mouth daily. Contains 400 mcg folic acid   tamsulosin 0.4 MG Caps capsule Commonly known as: FLOMAX Take 1 capsule (0.4 mg total) by mouth daily.        Allergies:  Allergies  Allergen Reactions   Dapagliflozin Nausea And Vomiting    Made him really sick   Morphine Nausea And Vomiting   Trulicity [Dulaglutide] Nausea And Vomiting    Family History: Family History  Problem Relation Age of Onset    Bone cancer Mother    Cancer Mother    Cancer Father    Stroke Brother    Kidney disease Neg Hx     Social History:  reports that he has never smoked. He has never  used smokeless tobacco. He reports that he does not drink alcohol and does not use drugs.   Physical Exam: BP (!) 154/79   Pulse 79   Ht 5' 7.99" (1.727 m)   Wt 205 lb (93 kg)   BMI 31.18 kg/m   Constitutional:  Alert and oriented, No acute distress. HEENT: [place] AT, moist mucus membranes.  Trachea midline, no masses. Neurologic: Grossly intact, no focal deficits, moving all 4 extremities. Psychiatric: Normal mood and affect.   Assessment & Plan:    History of renal cell carcinoma x2  - We had discussed decreasing surveillance imaging to every two years based on his aging comorbidities. Therefore, plan for re-imaging next year since he had no evidence of disease last year. He is in agreement with this.  -CT abd/ pelvis in one year  2. BPH with incomplete bladder emptying  - PVR today is improved from previous visit.  -Will keep him on Flomax and Finasteride.  3. History of elevated PSA  - Stable.  -Has had extensive work-up in the past to control this.  -Will continue to monitor to ensure stability.    Return in about 1 year (around 09/17/2023) for CT scan, IPSS, PVR, PSA.  I have reviewed the above documentation for accuracy and completeness, and I agree with the above.   Jason Scotland, MD    [facility] [address] [place], [place] [address] [phone]

## 2022-09-18 ENCOUNTER — Ambulatory Visit (INDEPENDENT_AMBULATORY_CARE_PROVIDER_SITE_OTHER): Payer: Medicare Other | Admitting: Nurse Practitioner

## 2022-09-18 ENCOUNTER — Encounter: Payer: Self-pay | Admitting: Nurse Practitioner

## 2022-09-18 VITALS — BP 138/76 | HR 67 | Temp 98.0°F | Ht 67.99 in | Wt 205.9 lb

## 2022-09-18 DIAGNOSIS — I700 Atherosclerosis of aorta: Secondary | ICD-10-CM

## 2022-09-18 DIAGNOSIS — E785 Hyperlipidemia, unspecified: Secondary | ICD-10-CM

## 2022-09-18 DIAGNOSIS — D692 Other nonthrombocytopenic purpura: Secondary | ICD-10-CM | POA: Diagnosis not present

## 2022-09-18 DIAGNOSIS — M0609 Rheumatoid arthritis without rheumatoid factor, multiple sites: Secondary | ICD-10-CM

## 2022-09-18 DIAGNOSIS — E0821 Diabetes mellitus due to underlying condition with diabetic nephropathy: Secondary | ICD-10-CM

## 2022-09-18 DIAGNOSIS — Z7984 Long term (current) use of oral hypoglycemic drugs: Secondary | ICD-10-CM

## 2022-09-18 DIAGNOSIS — E1159 Type 2 diabetes mellitus with other circulatory complications: Secondary | ICD-10-CM

## 2022-09-18 DIAGNOSIS — I152 Hypertension secondary to endocrine disorders: Secondary | ICD-10-CM | POA: Diagnosis not present

## 2022-09-18 DIAGNOSIS — N401 Enlarged prostate with lower urinary tract symptoms: Secondary | ICD-10-CM

## 2022-09-18 DIAGNOSIS — E1169 Type 2 diabetes mellitus with other specified complication: Secondary | ICD-10-CM | POA: Diagnosis not present

## 2022-09-18 DIAGNOSIS — R972 Elevated prostate specific antigen [PSA]: Secondary | ICD-10-CM

## 2022-09-18 DIAGNOSIS — Z85528 Personal history of other malignant neoplasm of kidney: Secondary | ICD-10-CM | POA: Diagnosis not present

## 2022-09-18 DIAGNOSIS — N138 Other obstructive and reflux uropathy: Secondary | ICD-10-CM

## 2022-09-18 LAB — BAYER DCA HB A1C WAIVED: HB A1C (BAYER DCA - WAIVED): 6.1 % — ABNORMAL HIGH (ref 4.8–5.6)

## 2022-09-18 NOTE — Assessment & Plan Note (Signed)
Followed by urology, continue this collaboration.  Recent notes reviewed. 

## 2022-09-18 NOTE — Assessment & Plan Note (Signed)
Chronic, ongoing in patient with history of renal cell CA.  A1c trend remains at 6.1% today.  Did not tolerate Trulicity, GI issues.  Marcelline Deist tried with GI issues presenting.  - Continue Metformin 1000 MG BID, Januvia 100 MG daily ,and low dose Amaryl, which he has taken in past -- 0.5 MG and ensure no frequent BS <70, he is aware to notify provider if this presents.  Goal to avoid insulin if possible. - Monitor BS daily in morning at home.  - Eye exam not up to date, recommend he obtain.  Foot exam up to date - Return in 3 months, sooner if needed dependent on labs.

## 2022-09-18 NOTE — Assessment & Plan Note (Signed)
Chronic, ongoing.  Continue current medication regimen and adjust as needed. Lipid panel today. 

## 2022-09-18 NOTE — Assessment & Plan Note (Signed)
Chronic, stable.  BP at goal for age in office today on recheck, avoid hypotension due to age.  Recommend he monitor BP at home at least three mornings a week + focus on DASH diet.  Continue current medication regimen and adjust as needed.  Labs: A1c.  Urine ALB 10 June 2022.  May benefit addition of ACE or ARB in future and transition off BB.

## 2022-09-18 NOTE — Assessment & Plan Note (Signed)
Continue collaboration with urology.  Recent note reviewed.  PSA monitored by them.   

## 2022-09-18 NOTE — Assessment & Plan Note (Signed)
Chronic, ongoing.  Followed by rheumatology with improved functional status.  Continue this collaboration and current regimen as prescribed by them.  Recent note reviewed.  Discussed that he can take occasional Tylenol as needed for back pain, max dosing 3000 MG daily. 

## 2022-09-18 NOTE — Assessment & Plan Note (Signed)
Chronic, ongoing.  Continue current medication regimen and collaboration with urology.  Recent note reviewed. 

## 2022-09-18 NOTE — Assessment & Plan Note (Signed)
Ongoing.  Noted on CXR 07/20/19.  Continue statin and ASA daily for prevention.  Monitor closely.  Educated patient on this. 

## 2022-09-18 NOTE — Progress Notes (Signed)
BP 138/76 (BP Location: Left Arm, Patient Position: Sitting, Cuff Size: Normal)   Pulse 67   Temp 98 F (36.7 C) (Oral)   Ht 5' 7.99" (1.727 m)   Wt 205 lb 14.4 oz (93.4 kg)   SpO2 96%   BMI 31.31 kg/m    Subjective:    Patient ID: Jason Contreras, male    DOB: [birth year], 81 y.o.   MRN: [id number]  HPI: Jason Contreras is a 82 y.o. male  Chief Complaint  Patient presents with   Diabetes   Hypertension   Hyperlipidemia   RA   Benign Prostatic Hypertrophy   DIABETES A1c 6.1% February.  Continues on Metformin 1000 MG BID, Glimepiride 0.5 MG daily, and Januvia 100 MG daily.   Continues on Gabapentin 600 MG TID and B12 for neuropathy. Hypoglycemic episodes:no Polydipsia/polyuria: no Visual disturbance: no Chest pain: no Paresthesias: no Glucose Monitoring: no             Accucheck frequency: Monday, Wednesday, Friday             Fasting glucose: 121 this morning             Post prandial:             Evening:             Before meals: Taking Insulin?: no             Long acting insulin:             Short acting insulin: Blood Pressure Monitoring: a few times a week Retinal Examination: Not up to Date - needs to schedule Foot Exam: Up to Date Pneumovax: Not up to Date Influenza: Up to Date Aspirin: no    HYPERTENSION / HYPERLIPIDEMIA Continues on Atorvastatin 20 MG and Coreg 6.25 MG BID.  Aortic atherosclerosis noted on past CXR 07/20/19. Satisfied with current treatment? yes Duration of hypertension: chronic BP monitoring frequency: a few times a week BP range:  BP medication side effects: no Duration of hyperlipidemia: chronic Cholesterol medication side effects: no Cholesterol supplements: none Medication compliance: good compliance Aspirin: no Recent stressors: no Recurrent headaches: no Visual changes: no Palpitations: no Dyspnea: no Chest pain: no Lower extremity edema: no Dizzy/lightheaded: no    RHEUMATOID ARTHRITIS He is currently followed by  rheumatology for RA of multiple sites with negative Rf.  Last visit 08/01/22.  Overall has been much improved since taking Methotrexate. Pain control status: stable Duration: chronic Location: multiple areas What Activities task can be accomplished with current medication? ADL's daily Previous pain specialty evaluation: yes Non-narcotic analgesic meds: yes   ELEVATED PSA: Being followed by urology with last visit on 09/17/22 -- returns in one year for CT scan and testing.  Last PSA 4.1 at visit. Had negative biopsy.  Has history of kidney cancer to right side, had two operations on this.  Continues on Tamsulosin and Finasteride.   Relevant past medical, surgical, family and social history reviewed and updated as indicated. Interim medical history since our last visit reviewed. Allergies and medications reviewed and updated.  Review of Systems  Constitutional:  Negative for activity change, diaphoresis, fatigue and fever.  Respiratory:  Negative for cough, chest tightness, shortness of breath and wheezing.   Cardiovascular:  Negative for chest pain, palpitations and leg swelling.  Gastrointestinal: Negative.   Endocrine: Negative for polydipsia, polyphagia and polyuria.  Neurological:  Negative for dizziness, syncope, weakness and numbness.  Psychiatric/Behavioral: Negative.  Per HPI unless specifically indicated above     Objective:    BP 138/76 (BP Location: Left Arm, Patient Position: Sitting, Cuff Size: Normal)   Pulse 67   Temp 98 F (36.7 C) (Oral)   Ht 5' 7.99" (1.727 m)   Wt 205 lb 14.4 oz (93.4 kg)   SpO2 96%   BMI 31.31 kg/m   Wt Readings from Last 3 Encounters:  09/18/22 205 lb 14.4 oz (93.4 kg)  09/17/22 205 lb (93 kg)  06/12/22 198 lb 6.4 oz (90 kg)    Physical Exam Vitals and nursing note reviewed.  Constitutional:      General: He is awake. He is not in acute distress.    Appearance: He is well-developed and well-groomed. He is obese. He is not  ill-appearing or toxic-appearing.  HENT:     Head: Normocephalic and atraumatic.     Right Ear: Hearing normal. No drainage.     Left Ear: Hearing normal. No drainage.  Eyes:     General: Lids are normal.        Right eye: No discharge.        Left eye: No discharge.     Conjunctiva/sclera: Conjunctivae normal.     Pupils: Pupils are equal, round, and reactive to light.  Neck:     Thyroid: No thyromegaly.     Vascular: No carotid bruit or JVD.     Trachea: Trachea normal.  Cardiovascular:     Rate and Rhythm: Normal rate and regular rhythm.     Heart sounds: Normal heart sounds, S1 normal and S2 normal. No murmur heard.    No gallop.  Pulmonary:     Effort: Pulmonary effort is normal. No accessory muscle usage or respiratory distress.     Breath sounds: Normal breath sounds.  Abdominal:     General: Bowel sounds are normal. There is no distension.     Palpations: Abdomen is soft. There is no hepatomegaly.     Tenderness: There is no abdominal tenderness. There is no right CVA tenderness or left CVA tenderness.     Hernia: No hernia is present.  Musculoskeletal:        General: Normal range of motion.     Cervical back: Normal range of motion and neck supple.     Right lower leg: No edema.     Left lower leg: No edema.  Skin:    General: Skin is warm and dry.     Capillary Refill: Capillary refill takes less than 2 seconds.     Comments: Scattered pale purple/yellow bruises noted to BUE.  Neurological:     Mental Status: He is alert and oriented to person, place, and time.     Coordination: Coordination is intact.     Gait: Gait is intact.     Deep Tendon Reflexes: Reflexes are normal and symmetric.     Reflex Scores:      Brachioradialis reflexes are 2+ on the right side and 2+ on the left side.      Patellar reflexes are 2+ on the right side and 2+ on the left side. Psychiatric:        Mood and Affect: Mood normal.        Behavior: Behavior normal. Behavior is  cooperative.        Thought Content: Thought content normal.        Judgment: Judgment normal.    Results for orders placed or performed in visit on 09/17/22  Bladder Scan (Post Void Residual) in office  Result Value Ref Range   Scan Result 166       Assessment & Plan:   Problem List Items Addressed This Visit       Cardiovascular and Mediastinum   Aortic atherosclerosis (HCC)    Ongoing.  Noted on CXR 07/20/19.  Continue statin and ASA daily for prevention.  Monitor closely.  Educated patient on this.      Hypertension associated with diabetes (HCC)    Chronic, stable.  BP at goal for age in office today on recheck, avoid hypotension due to age.  Recommend he monitor BP at home at least three mornings a week + focus on DASH diet.  Continue current medication regimen and adjust as needed.  Labs: A1c.  Urine ALB 10 June 2022.  May benefit addition of ACE or ARB in future and transition off BB.      Relevant Orders   Bayer DCA Hb A1c Waived   Senile purpura (HCC)    Chronic, stable, noted on exams.  Recommend continue to perform gentle skin care and monitor for skin breakdown, if present notify provider.        Endocrine   DM due to underlying condition with diabetic nephropathy (HCC) - Primary    Chronic, ongoing in patient with history of renal cell CA.  A1c trend remains at 6.1% today.  Did not tolerate Trulicity, GI issues.  Marcelline Deist tried with GI issues presenting.  - Continue Metformin 1000 MG BID, Januvia 100 MG daily ,and low dose Amaryl, which he has taken in past -- 0.5 MG and ensure no frequent BS <70, he is aware to notify provider if this presents.  Goal to avoid insulin if possible. - Monitor BS daily in morning at home.  - Eye exam not up to date, recommend he obtain.  Foot exam up to date - Return in 3 months, sooner if needed dependent on labs.      Relevant Orders   Bayer DCA Hb A1c Waived   Hyperlipidemia associated with type 2 diabetes mellitus (HCC)     Chronic, ongoing.  Continue current medication regimen and adjust as needed.  Lipid panel today.      Relevant Orders   Bayer DCA Hb A1c Waived     Musculoskeletal and Integument   Rheumatoid arthritis of multiple sites with negative rheumatoid factor (HCC)    Chronic, ongoing.  Followed by rheumatology with improved functional status.  Continue this collaboration and current regimen as prescribed by them.  Recent note reviewed.  Discussed that he can take occasional Tylenol as needed for back pain, max dosing 3000 MG daily.        Genitourinary   BPH with obstruction/lower urinary tract symptoms    Chronic, ongoing.  Continue current medication regimen and collaboration with urology.  Recent note reviewed.         Other   Elevated PSA    Continue collaboration with urology.  Recent note reviewed.  PSA monitored by them.        Personal history of renal cancer    Followed by urology, continue this collaboration.  Recent notes reviewed.        Follow up plan: Return in about 3 months (around 12/19/2022) for T2DM, HTN/HLD, RA, BPH.

## 2022-09-18 NOTE — Assessment & Plan Note (Signed)
Chronic, stable, noted on exams.  Recommend continue to perform gentle skin care and monitor for skin breakdown, if present notify provider. 

## 2022-11-01 ENCOUNTER — Other Ambulatory Visit: Payer: Self-pay | Admitting: Nurse Practitioner

## 2022-11-01 DIAGNOSIS — E1159 Type 2 diabetes mellitus with other circulatory complications: Secondary | ICD-10-CM

## 2022-11-04 NOTE — Telephone Encounter (Signed)
Requested Prescriptions  Refused Prescriptions Disp Refills   JANUVIA 100 MG tablet [Pharmacy Med Name: Januvia 100 MG Oral Tablet] 100 tablet 2    Sig: TAKE 1 TABLET BY MOUTH DAILY     Endocrinology:  Diabetes - DPP-4 Inhibitors Passed - 11/01/2022 10:56 PM      Passed - HBA1C is between 0 and 7.9 and within 180 days    Hemoglobin A1C  Date Value Ref Range Status  12/27/2015 6.3  Final   HB A1C (BAYER DCA - WAIVED)  Date Value Ref Range Status  09/18/2022 6.1 (H) 4.8 - 5.6 % Final    Comment:             Prediabetes: 5.7 - 6.4          Diabetes: >6.4          Glycemic control for adults with diabetes: <7.0          Passed - Cr in normal range and within 360 days    Creatinine, Ser  Date Value Ref Range Status  06/12/2022 0.87 0.76 - 1.27 mg/dL Final         Passed - Valid encounter within last 6 months    Recent Outpatient Visits           1 month ago Diabetes mellitus due to underlying condition with diabetic nephropathy, without long-term current use of insulin (HCC)   [place] [facility] [place], [place] T, NP   4 months ago Diabetes mellitus due to underlying condition with diabetic nephropathy, without long-term current use of insulin (HCC)   [place] [facility] [place], Vand[place] T, NP   5 months ago Subacute cough   [place] [facility] [place], [place] T, NP   7 months ago Generalized abdominal pain   [place] [facility] [place], [place] T, NP   7 months ago Diabetes mellitus due to underlying condition with diabetic nephropathy, without long-term current use of insulin (HCC)   [place] [facility] [place], Dorie Rank, NP       Future Appointments             In 1 month [name], Dorie Rank, NP  [facility]   In 10 months [name], MD [facility] [place]             tamsulosin (FLOMAX) 0.4 MG CAPS capsule [Pharmacy Med Name: Tamsulosin  HCl 0.4 MG Oral Capsule] 100 capsule 2    Sig: TAKE 1 CAPSULE BY MOUTH DAILY     Urology: Alpha-Adrenergic Blocker Failed - 11/01/2022 10:56 PM      Failed - PSA in normal range and within 360 days    Prostate Specific Ag, Serum  Date Value Ref Range Status  09/16/2022 4.1 (H) 0.0 - 4.0 ng/mL Final    Comment:    Roche ECLIA methodology. According to the American Urological Association, Serum PSA should decrease and remain at undetectable levels after radical prostatectomy. The AUA defines biochemical recurrence as an initial PSA value 0.2 ng/mL or greater followed by a subsequent confirmatory PSA value 0.2 ng/mL or greater. Values obtained with different assay methods or kits cannot be used interchangeably. Results cannot be interpreted as absolute evidence of the presence or absence of malignant disease.          Passed - Last BP in normal range    BP Readings from Last 1 Encounters:  09/18/22 138/76  Passed - Valid encounter within last 12 months    Recent Outpatient Visits           1 month ago Diabetes mellitus due to underlying condition with diabetic nephropathy, without long-term current use of insulin (HCC)   [place] [facility] [place], [place] T, NP   4 months ago Diabetes mellitus due to underlying condition with diabetic nephropathy, without long-term current use of insulin (HCC)   [place] [facility] [place], [place] T, NP   5 months ago Subacute cough   [place] [facility] [place], [place] T, NP   7 months ago Generalized abdominal pain   [place] [facility] [place], [place] T, NP   7 months ago Diabetes mellitus due to underlying condition with diabetic nephropathy, without long-term current use of insulin (HCC)   [place] [facility], Corrie Dandy T, NP       Future Appointments             In 1 month [place], [place] T, NP [facility], [facility]    In 10 months [name], MD [facility] [place]             carvedilol (COREG) 6.25 MG tablet [Pharmacy Med Name: Carvedilol 6.25 MG Oral Tablet] 200 tablet 2    Sig: TAKE 1 TABLET BY MOUTH TWICE  DAILY     Cardiovascular: Beta Blockers 3 Passed - 11/01/2022 10:56 PM      Passed - Cr in normal range and within 360 days    Creatinine, Ser  Date Value Ref Range Status  06/12/2022 0.87 0.76 - 1.27 mg/dL Final         Passed - AST in normal range and within 360 days    AST  Date Value Ref Range Status  06/12/2022 17 0 - 40 IU/L Final   AST (SGOT) Piccolo, Waived  Date Value Ref Range Status  10/13/2018 16 11 - 38 U/L Final         Passed - ALT in normal range and within 360 days    ALT  Date Value Ref Range Status  06/12/2022 14 0 - 44 IU/L Final   ALT (SGPT) Piccolo, Waived  Date Value Ref Range Status  10/13/2018 19 10 - 47 U/L Final         Passed - Last BP in normal range    BP Readings from Last 1 Encounters:  09/18/22 138/76         Passed - Last Heart Rate in normal range    Pulse Readings from Last 1 Encounters:  09/18/22 67         Passed - Valid encounter within last 6 months    Recent Outpatient Visits           1 month ago Diabetes mellitus due to underlying condition with diabetic nephropathy, without long-term current use of insulin (HCC)   [place] [facility] [place], [place] T, NP   4 months ago Diabetes mellitus due to underlying condition with diabetic nephropathy, without long-term current use of insulin (HCC)   [place] [facility] [place], [place] T, NP   5 months ago Subacute cough   [place] [facility] [place], [place]ett T, NP   7 months ago Generalized abdominal pain    [facility] [place], [place] T, NP   7 months ago Diabetes mellitus due to underlying condition with diabetic nephropathy,  without long-term current use of insulin (HCC)   [place]  [facility] [place], Corrie Dandy T, NP       Future Appointments             In 1 month [place], Dorie Rank, NP [place] [facility], [facility]   In 10 months [name], MD [facility] [place]             atorvastatin (LIPITOR) 20 MG tablet [Pharmacy Med Name: Atorvastatin Calcium 20 MG Oral Tablet] 100 tablet 2    Sig: TAKE 1 TABLET BY MOUTH DAILY AT  6 PM.     Cardiovascular:  Antilipid - Statins Failed - 11/01/2022 10:56 PM      Failed - Lipid Panel in normal range within the last 12 months    Cholesterol, Total  Date Value Ref Range Status  06/12/2022 112 100 - 199 mg/dL Final   Cholesterol Piccolo, Waived  Date Value Ref Range Status  10/13/2018 101 <200 mg/dL Final    Comment:                            Desirable                <200                         Borderline High      200- 239                         High                     >239    LDL Chol Calc (NIH)  Date Value Ref Range Status  06/12/2022 46 0 - 99 mg/dL Final   HDL  Date Value Ref Range Status  06/12/2022 43 >39 mg/dL Final   Triglycerides  Date Value Ref Range Status  06/12/2022 128 0 - 149 mg/dL Final   Triglycerides Piccolo,Waived  Date Value Ref Range Status  10/13/2018 91 <150 mg/dL Final    Comment:                            Normal                   <150                         Borderline High     150 - 199                         High                200 - 499                         Very High                >499          Passed - Patient is not pregnant      Passed - Valid encounter within last 12 months    Recent Outpatient Visits           1 month ago Diabetes mellitus due to underlying condition with diabetic nephropathy, without long-term current use of  insulin (HCC)   [facility] [place], [place] T, NP   4 months ago Diabetes mellitus due to underlying condition with diabetic nephropathy, without long-term current  use of insulin (HCC)   [place] [facility] [place], [place] T, NP   5 months ago Subacute cough   [place] [facility] [place], [place] T, NP   7 months ago Generalized abdominal pain   [place] [facility] [place], [place] T, NP   7 months ago Diabetes mellitus due to underlying condition with diabetic nephropathy, without long-term current use of insulin (HCC)   [place] [facility] [place], Dorie Rank, NP       Future Appointments             In 1 month [place], Dorie Rank, NP [facility], [facility]   In 10 months [name], MD [facility]

## 2022-12-08 ENCOUNTER — Other Ambulatory Visit: Payer: Self-pay | Admitting: Nurse Practitioner

## 2022-12-09 NOTE — Telephone Encounter (Signed)
Requested Prescriptions  Pending Prescriptions Disp Refills   finasteride (PROSCAR) 5 MG tablet [Pharmacy Med Name: Finasteride 5 MG Oral Tablet] 100 tablet 0    Sig: TAKE 1 TABLET BY MOUTH DAILY     Urology: 5-alpha Reductase Inhibitors Failed - 12/08/2022 10:23 PM      Failed - PSA in normal range and within 360 days    Prostate Specific Ag, Serum  Date Value Ref Range Status  09/16/2022 4.1 (H) 0.0 - 4.0 ng/mL Final    Comment:    Roche ECLIA methodology. According to the American Urological Association, Serum PSA should decrease and remain at undetectable levels after radical prostatectomy. The AUA defines biochemical recurrence as an initial PSA value 0.2 ng/mL or greater followed by a subsequent confirmatory PSA value 0.2 ng/mL or greater. Values obtained with different assay methods or kits cannot be used interchangeably. Results cannot be interpreted as absolute evidence of the presence or absence of malignant disease.          Passed - Valid encounter within last 12 months    Recent Outpatient Visits           2 months ago Diabetes mellitus due to underlying condition with diabetic nephropathy, without long-term current use of insulin (HCC)   [facility] [place], [place] T, NP   6 months ago Diabetes mellitus due to underlying condition with diabetic nephropathy, without long-term current use of insulin (HCC)   [place] [facility] [place], [place] T, NP   6 months ago Subacute cough   [place] [facility] [place], [place] T, NP   8 months ago Generalized abdominal pain   [place] [facility] [place], [place] T, NP   8 months ago Diabetes mellitus due to underlying condition with diabetic nephropathy, without long-term current use of insulin (HCC)   [place] [facility] [place], Dorie Rank, NP       Future Appointments             In 1 week Marjie Skiff, NP [facility], [facility]   In 9 months [name], MD [facility]

## 2022-12-10 ENCOUNTER — Other Ambulatory Visit: Payer: Self-pay | Admitting: Nurse Practitioner

## 2022-12-10 NOTE — Telephone Encounter (Signed)
Requested Prescriptions  Pending Prescriptions Disp Refills   DULoxetine (CYMBALTA) 60 MG capsule [Pharmacy Med Name: DULoxetine HCl 60 MG Oral Capsule Delayed Release Particles] 90 capsule 1    Sig: TAKE 1 CAPSULE BY MOUTH DAILY     Psychiatry: Antidepressants - SNRI - duloxetine Passed - 12/10/2022  4:52 AM      Passed - Cr in normal range and within 360 days    Creatinine, Ser  Date Value Ref Range Status  06/12/2022 0.87 0.76 - 1.27 mg/dL Final         Passed - eGFR is 30 or above and within 360 days    GFR calc Af Amer  Date Value Ref Range Status  06/02/2020 99 >59 mL/min/1.73 Final    Comment:    **In accordance with recommendations from the NKF-ASN Task force,**   [facility] is in the process of updating its eGFR calculation to the   2021 CKD-EPI creatinine equation that estimates kidney function   without a race variable.    GFR calc non Af Amer  Date Value Ref Range Status  06/02/2020 85 >59 mL/min/1.73 Final   eGFR  Date Value Ref Range Status  06/12/2022 88 >59 mL/min/1.73 Final         Passed - Completed PHQ-2 or PHQ-9 in the last 360 days      Passed - Last BP in normal range    BP Readings from Last 1 Encounters:  09/18/22 138/76         Passed - Valid encounter within last 6 months    Recent Outpatient Visits           2 months ago Diabetes mellitus due to underlying condition with diabetic nephropathy, without long-term current use of insulin (HCC)   [place] [facility] [place], [place] T, NP   6 months ago Diabetes mellitus due to underlying condition with diabetic nephropathy, without long-term current use of insulin (HCC)   [place] [facility] [place], [place] T, NP   6 months ago Subacute cough   [place] [facility] [place], [place] T, NP   8 months ago Generalized abdominal pain   [place] [facility] [place], [place] T, NP   8 months ago Diabetes mellitus due to underlying condition  with diabetic nephropathy, without long-term current use of insulin (HCC)   [place] [facility] [place], Dorie Rank, NP       Future Appointments             In 1 week Marjie Skiff, NP  [facility], [facility]   In 9 months [name], MD [facility]

## 2022-12-11 DIAGNOSIS — M0609 Rheumatoid arthritis without rheumatoid factor, multiple sites: Secondary | ICD-10-CM | POA: Diagnosis not present

## 2022-12-11 DIAGNOSIS — M159 Polyosteoarthritis, unspecified: Secondary | ICD-10-CM | POA: Diagnosis not present

## 2022-12-11 DIAGNOSIS — Z79899 Other long term (current) drug therapy: Secondary | ICD-10-CM | POA: Diagnosis not present

## 2022-12-15 NOTE — Patient Instructions (Signed)
Be Involved in Caring For Your Health:  Taking Medications When medications are taken as directed, they can greatly improve your health. But if they are not taken as prescribed, they may not work. In some cases, not taking them correctly can be harmful. To help ensure your treatment remains effective and safe, understand your medications and how to take them. Bring your medications to each visit for review by your provider.  Your lab results, notes, and after visit summary will be available on My Chart. We strongly encourage you to use this feature. If lab results are abnormal the clinic will contact you with the appropriate steps. If the clinic does not contact you assume the results are satisfactory. You can always view your results on My Chart. If you have questions regarding your health or results, please contact the clinic during office hours. You can also ask questions on My Chart.  We at [facility] are grateful that you chose us to provide your care. We strive to provide evidence-based and compassionate care and are always looking for feedback. If you get a survey from the clinic please complete this so we can hear your opinions.  Diabetes Mellitus Basics  Diabetes mellitus, or diabetes, is a long-term (chronic) disease. It occurs when the body does not properly use sugar (glucose) that is released from food after you eat. Diabetes mellitus may be caused by one or both of these problems: Your pancreas does not make enough of a hormone called insulin. Your body does not react in a normal way to the insulin that it makes. Insulin lets glucose enter cells in your body. This gives you energy. If you have diabetes, glucose cannot get into cells. This causes high blood glucose (hyperglycemia). How to treat and manage diabetes You may need to take insulin or other diabetes medicines daily to keep your glucose in balance. If you are prescribed insulin, you will learn how to give  yourself insulin by injection. You may need to adjust the amount of insulin you take based on the foods that you eat. You will need to check your blood glucose levels using a glucose monitor as told by your health care provider. The readings can help determine if you have low or high blood glucose. Generally, you should have these blood glucose levels: Before meals (preprandial): 80-130 mg/dL (4.4-7.2 mmol/L). After meals (postprandial): below 180 mg/dL (10 mmol/L). Hemoglobin A1c (HbA1c) level: less than 7%. Your health care provider will set treatment goals for you. Keep all follow-up visits. This is important. Follow these instructions at home: Diabetes medicines Take your diabetes medicines every day as told by your health care provider. List your diabetes medicines here: Name of medicine: ______________________________ Amount (dose): _______________ Time (a.m./p.m.): _______________ Notes: ___________________________________ Name of medicine: ______________________________ Amount (dose): _______________ Time (a.m./p.m.): _______________ Notes: ___________________________________ Name of medicine: ______________________________ Amount (dose): _______________ Time (a.m./p.m.): _______________ Notes: ___________________________________ Insulin If you use insulin, list the types of insulin you use here: Insulin type: ______________________________ Amount (dose): _______________ Time (a.m./p.m.): _______________Notes: ___________________________________ Insulin type: ______________________________ Amount (dose): _______________ Time (a.m./p.m.): _______________ Notes: ___________________________________ Insulin type: ______________________________ Amount (dose): _______________ Time (a.m./p.m.): _______________ Notes: ___________________________________ Insulin type: ______________________________ Amount (dose): _______________ Time (a.m./p.m.): _______________ Notes:  ___________________________________ Insulin type: ______________________________ Amount (dose): _______________ Time (a.m./p.m.): _______________ Notes: ___________________________________ Managing blood glucose  Check your blood glucose levels using a glucose monitor as told by your health care provider. Write down the times that you check your glucose levels here: Time: _______________ Notes: ___________________________________   Time: _______________ Notes: ___________________________________ Time: _______________ Notes: ___________________________________ Time: _______________ Notes: ___________________________________ Time: _______________ Notes: ___________________________________ Time: _______________ Notes: ___________________________________  Low blood glucose Low blood glucose (hypoglycemia) is when glucose is at or below 70 mg/dL (3.9 mmol/L). Symptoms may include: Feeling: Hungry. Sweaty and clammy. Irritable or easily upset. Dizzy. Sleepy. Having: A fast heartbeat. A headache. A change in your vision. Numbness around the mouth, lips, or tongue. Having trouble with: Moving (coordination). Sleeping. Treating low blood glucose To treat low blood glucose, eat or drink something containing sugar right away. If you can think clearly and swallow safely, follow the 15:15 rule: Take 15 grams of a fast-acting carb (carbohydrate), as told by your health care provider. Some fast-acting carbs are: Glucose tablets: take 3-4 tablets. Hard candy: eat 3-5 pieces. Fruit juice: drink 4 oz (120 mL). Regular (not diet) soda: drink 4-6 oz (120-180 mL). Honey or sugar: eat 1 Tbsp (15 mL). Check your blood glucose levels 15 minutes after you take the carb. If your glucose is still at or below 70 mg/dL (3.9 mmol/L), take 15 grams of a carb again. If your glucose does not go above 70 mg/dL (3.9 mmol/L) after 3 tries, get help right away. After your glucose goes back to normal, eat a meal  or a snack within 1 hour. Treating very low blood glucose If your glucose is at or below 54 mg/dL (3 mmol/L), you have very low blood glucose (severe hypoglycemia). This is an emergency. Do not wait to see if the symptoms will go away. Get medical help right away. Call your local emergency services (911 in the [place] Do not drive yourself to the hospital. Questions to ask your health care provider Should I talk with a diabetes educator? What equipment will I need to care for myself at home? What diabetes medicines do I need? When should I take them? How often do I need to check my blood glucose levels? What number can I call if I have questions? When is my follow-up visit? Where can I find a support group for people with diabetes? Where to find more information American Diabetes Association: www.diabetes.org Association of Diabetes Care and Education Specialists: www.diabeteseducator.org Contact a health care provider if: Your blood glucose is at or above 240 mg/dL (13.3 mmol/L) for 2 days in a row. You have been sick or have had a fever for 2 days or more, and you are not getting better. You have any of these problems for more than 6 hours: You cannot eat or drink. You feel nauseous. You vomit. You have diarrhea. Get help right away if: Your blood glucose is lower than 54 mg/dL (3 mmol/L). You get confused. You have trouble thinking clearly. You have trouble breathing. These symptoms may represent a serious problem that is an emergency. Do not wait to see if the symptoms will go away. Get medical help right away. Call your local emergency services (911 in the [place] Do not drive yourself to the hospital. Summary Diabetes mellitus is a chronic disease that occurs when the body does not properly use sugar (glucose) that is released from food after you eat. Take insulin and diabetes medicines as told. Check your blood glucose every day, as often as told. Keep all follow-up visits. This  is important. This information is not intended to replace advice given to you by your health care provider. Make sure you discuss any questions you have with your health care provider. Document Revised: 08/24/2019 Document Reviewed: 08/24/2019 Elsevier Patient Education    2024 Elsevier Inc.  

## 2022-12-20 ENCOUNTER — Ambulatory Visit (INDEPENDENT_AMBULATORY_CARE_PROVIDER_SITE_OTHER): Payer: Medicare Other | Admitting: Nurse Practitioner

## 2022-12-20 ENCOUNTER — Encounter: Payer: Self-pay | Admitting: Nurse Practitioner

## 2022-12-20 VITALS — BP 109/62 | HR 88 | Temp 97.9°F | Ht 68.0 in | Wt 202.0 lb

## 2022-12-20 DIAGNOSIS — I10 Essential (primary) hypertension: Secondary | ICD-10-CM | POA: Diagnosis not present

## 2022-12-20 DIAGNOSIS — I700 Atherosclerosis of aorta: Secondary | ICD-10-CM

## 2022-12-20 DIAGNOSIS — R972 Elevated prostate specific antigen [PSA]: Secondary | ICD-10-CM

## 2022-12-20 DIAGNOSIS — D692 Other nonthrombocytopenic purpura: Secondary | ICD-10-CM

## 2022-12-20 DIAGNOSIS — M0609 Rheumatoid arthritis without rheumatoid factor, multiple sites: Secondary | ICD-10-CM | POA: Diagnosis not present

## 2022-12-20 DIAGNOSIS — E782 Mixed hyperlipidemia: Secondary | ICD-10-CM | POA: Diagnosis not present

## 2022-12-20 DIAGNOSIS — E1159 Type 2 diabetes mellitus with other circulatory complications: Secondary | ICD-10-CM

## 2022-12-20 DIAGNOSIS — E1169 Type 2 diabetes mellitus with other specified complication: Secondary | ICD-10-CM | POA: Diagnosis not present

## 2022-12-20 DIAGNOSIS — E1141 Type 2 diabetes mellitus with diabetic mononeuropathy: Secondary | ICD-10-CM

## 2022-12-20 DIAGNOSIS — E0821 Diabetes mellitus due to underlying condition with diabetic nephropathy: Secondary | ICD-10-CM

## 2022-12-20 DIAGNOSIS — E785 Hyperlipidemia, unspecified: Secondary | ICD-10-CM | POA: Diagnosis not present

## 2022-12-20 DIAGNOSIS — Z85528 Personal history of other malignant neoplasm of kidney: Secondary | ICD-10-CM

## 2022-12-20 DIAGNOSIS — E1140 Type 2 diabetes mellitus with diabetic neuropathy, unspecified: Secondary | ICD-10-CM | POA: Insufficient documentation

## 2022-12-20 DIAGNOSIS — I152 Hypertension secondary to endocrine disorders: Secondary | ICD-10-CM | POA: Diagnosis not present

## 2022-12-20 LAB — BAYER DCA HB A1C WAIVED: HB A1C (BAYER DCA - WAIVED): 6.6 % — ABNORMAL HIGH (ref 4.8–5.6)

## 2022-12-20 MED ORDER — GABAPENTIN 600 MG PO TABS: ORAL_TABLET | ORAL | 4 refills | Status: DC

## 2022-12-20 MED ORDER — DULOXETINE HCL 60 MG PO CPEP: 60.0000 mg | ORAL_CAPSULE | Freq: Every day | ORAL | 4 refills | Status: DC

## 2022-12-20 NOTE — Assessment & Plan Note (Signed)
Chronic, ongoing.  Followed by rheumatology with improved functional status.  Continue this collaboration and current regimen as prescribed by them.  Recent note reviewed.  Discussed that he can take occasional Tylenol as needed for back pain, max dosing 3000 MG daily.

## 2022-12-20 NOTE — Assessment & Plan Note (Signed)
Chronic, ongoing in patient with history of renal cell CA.  A1c trend up mildly today to 6.6%, although remains at goal.  Did not tolerate Trulicity, GI issues.  Marcelline Deist tried with GI issues presenting.  - Continue Metformin 1000 MG BID, Januvia 100 MG daily ,and low dose Amaryl, which he has taken in past -- 0.5 MG and ensure no frequent BS <70, he is aware to notify provider if this presents.  Goal to avoid insulin if possible. - Monitor BS daily in morning at home.  - Eye exam not up to date, recommend he obtain.  Foot exam up to date - Consider change from BB to ACE/ARB in future.  Statin on board. - Return in 3 months, sooner if needed dependent on labs.

## 2022-12-20 NOTE — Assessment & Plan Note (Signed)
Continue collaboration with urology.  Recent note reviewed.  PSA monitored by them.

## 2022-12-20 NOTE — Assessment & Plan Note (Signed)
Chronic, stable, noted on exams.  Recommend continue to perform gentle skin care and monitor for skin breakdown, if present notify provider.

## 2022-12-20 NOTE — Assessment & Plan Note (Signed)
Chronic, ongoing.  Continue current medication regimen and adjust as needed. Lipid panel today. 

## 2022-12-20 NOTE — Assessment & Plan Note (Signed)
Ongoing.  Noted on CXR 07/20/19.  Continue statin and ASA daily for prevention.  Monitor closely.  Educated patient on this. 

## 2022-12-20 NOTE — Assessment & Plan Note (Signed)
Followed by urology, continue this collaboration.  Recent notes reviewed.

## 2022-12-20 NOTE — Assessment & Plan Note (Addendum)
To bilateral feet. A1c trend up mildly today to 6.6%, although remains at goal.  Did not tolerate Trulicity, GI issues.  Marcelline Deist tried with GI issues presenting.  - Continue Metformin 1000 MG BID, Januvia 100 MG daily ,and low dose Amaryl, which he has taken in past -- 0.5 MG and ensure no frequent BS <70, he is aware to notify provider if this presents.  Goal to avoid insulin if possible. - Increase Gabapentin to 600 MG BID and 900 MG at night (current CrCl 85) and restart Duloxetine. - Monitor BS daily in morning at home.  - Eye exam not up to date, recommend he obtain.  Foot exam up to date - Consider change from BB to ACE/ARB in future.  Statin on board. - Return in 3 months, sooner if needed dependent on labs.

## 2022-12-20 NOTE — Assessment & Plan Note (Signed)
Chronic, stable.  BP at goal for age in office today, avoid hypotension due to age.  Recommend he monitor BP at home at least three mornings a week + focus on DASH diet.  Continue current medication regimen and adjust as needed.  Labs: A1c & CMP.  Urine ALB 10 June 2022.  May benefit addition of ACE or ARB in future and transition off BB.

## 2022-12-20 NOTE — Progress Notes (Signed)
BP 109/62   Pulse 88   Temp 97.9 F (36.6 C) (Oral)   Ht 5\' 8"  (1.727 m)   Wt 202 lb (91.6 kg)   SpO2 97%   BMI 30.71 kg/m    Subjective:    Patient ID: Jason Contreras, male    DOB: 26-Jun-1940, 82 y.o.   MRN: [id number]  HPI: Jason Contreras is a 82 y.o. male  Chief Complaint  Patient presents with   Benign Prostatic Hypertrophy   Diabetes   Hyperlipidemia   Hypertension   Rheumatoid Arthritis   DIABETES Last A1c 6.1% in May.  Continues on Metformin 1000 MG BID, Glimepiride 0.5 MG daily, and Januvia 100 MG daily.  Ate lots of cookies recently when daughter was visiting.   Continues on Gabapentin 600 MG TID and B12 for neuropathy.  This is worse at night.  Has not been taking Duloxetine. Hypoglycemic episodes:no Polydipsia/polyuria: no Visual disturbance: no Chest pain: no Paresthesias: no Glucose Monitoring: no             Accucheck frequency: Monday, Wednesday, Friday             Fasting glucose: 110 to 140             Post prandial:             Evening:             Before meals: Taking Insulin?: no             Long acting insulin:             Short acting insulin: Blood Pressure Monitoring: a few times a week Retinal Examination: Not Up To Date - needs to schedule Foot Exam: Up To Date Pneumovax: Up To Date Influenza: Up to Date Aspirin: no    HYPERTENSION / HYPERLIPIDEMIA Continues on Atorvastatin 20 MG and Coreg 6.25 MG BID.  Aortic atherosclerosis noted on CXR 07/20/19. Satisfied with current treatment? yes Duration of hypertension: chronic BP monitoring frequency: a few times a week BP range:  BP medication side effects: no Duration of hyperlipidemia: chronic Cholesterol medication side effects: no Cholesterol supplements: none Medication compliance: good compliance Aspirin: no Recent stressors: no Recurrent headaches: no Visual changes: no Palpitations: no Dyspnea: no Chest pain: no Lower extremity edema: no Dizzy/lightheaded: no     RHEUMATOID ARTHRITIS Followed by rheumatology for RA of multiple sites with negative Rf.  Last visit 12/11/22.  Much improved since taking Methotrexate.   Pain control status: stable Duration: chronic Location: multiple areas What Activities task can be accomplished with current medication? ADL's daily Previous pain specialty evaluation: yes Non-narcotic analgesic meds: yes   ELEVATED PSA: Followed by urology with last visit 09/17/22 -- returns in one year for CT scan and testing.  Last PSA 4.1 at visit. Had negative biopsy.  Has history of kidney cancer to right side, had two operations on this.  Continues on Tamsulosin and Finasteride.   Relevant past medical, surgical, family and social history reviewed and updated as indicated. Interim medical history since our last visit reviewed. Allergies and medications reviewed and updated.  Review of Systems  Constitutional:  Negative for activity change, diaphoresis, fatigue and fever.  Respiratory:  Negative for cough, chest tightness, shortness of breath and wheezing.   Cardiovascular:  Negative for chest pain, palpitations and leg swelling.  Gastrointestinal: Negative.   Endocrine: Negative for polydipsia, polyphagia and polyuria.  Neurological:  Negative for dizziness, syncope, weakness and numbness.  Psychiatric/Behavioral:  Negative.      Per HPI unless specifically indicated above     Objective:    BP 109/62   Pulse 88   Temp 97.9 F (36.6 C) (Oral)   Ht 5\' 8"  (1.727 m)   Wt 202 lb (91.6 kg)   SpO2 97%   BMI 30.71 kg/m   Wt Readings from Last 3 Encounters:  12/20/22 202 lb (91.6 kg)  09/18/22 205 lb 14.4 oz (93.4 kg)  09/17/22 205 lb (93 kg)    Physical Exam Vitals and nursing note reviewed.  Constitutional:      General: He is awake. He is not in acute distress.    Appearance: He is well-developed and well-groomed. He is obese. He is not ill-appearing or toxic-appearing.  HENT:     Head: Normocephalic and atraumatic.      Right Ear: Hearing normal. No drainage.     Left Ear: Hearing normal. No drainage.  Eyes:     General: Lids are normal.        Right eye: No discharge.        Left eye: No discharge.     Conjunctiva/sclera: Conjunctivae normal.     Pupils: Pupils are equal, round, and reactive to light.  Neck:     Thyroid: No thyromegaly.     Vascular: No carotid bruit or JVD.     Trachea: Trachea normal.  Cardiovascular:     Rate and Rhythm: Normal rate and regular rhythm.     Heart sounds: Normal heart sounds, S1 normal and S2 normal. No murmur heard.    No gallop.  Pulmonary:     Effort: Pulmonary effort is normal. No accessory muscle usage or respiratory distress.     Breath sounds: Normal breath sounds.  Abdominal:     General: Bowel sounds are normal. There is no distension.     Palpations: Abdomen is soft. There is no hepatomegaly.     Tenderness: There is no abdominal tenderness. There is no right CVA tenderness or left CVA tenderness.     Hernia: No hernia is present.  Musculoskeletal:        General: Normal range of motion.     Cervical back: Normal range of motion and neck supple.     Right lower leg: No edema.     Left lower leg: No edema.  Skin:    General: Skin is warm and dry.     Capillary Refill: Capillary refill takes less than 2 seconds.     Comments: Scattered pale purple/yellow bruises noted to BUE.  Neurological:     Mental Status: He is alert and oriented to person, place, and time.     Coordination: Coordination is intact.     Gait: Gait is intact.     Deep Tendon Reflexes: Reflexes are normal and symmetric.     Reflex Scores:      Brachioradialis reflexes are 2+ on the right side and 2+ on the left side.      Patellar reflexes are 2+ on the right side and 2+ on the left side. Psychiatric:        Mood and Affect: Mood normal.        Behavior: Behavior normal. Behavior is cooperative.        Thought Content: Thought content normal.        Judgment: Judgment  normal.    Results for orders placed or performed in visit on 09/18/22  Bayer DCA Hb A1c Waived  Result Value  Ref Range   HB A1C (BAYER DCA - WAIVED) 6.1 (H) 4.8 - 5.6 %      Assessment & Plan:   Problem List Items Addressed This Visit       Cardiovascular and Mediastinum   Aortic atherosclerosis (HCC)    Ongoing.  Noted on CXR 07/20/19.  Continue statin and ASA daily for prevention.  Monitor closely.  Educated patient on this.      Hypertension associated with diabetes (HCC)    Chronic, stable.  BP at goal for age in office today, avoid hypotension due to age.  Recommend he monitor BP at home at least three mornings a week + focus on DASH diet.  Continue current medication regimen and adjust as needed.  Labs: A1c & CMP.  Urine ALB 10 June 2022.  May benefit addition of ACE or ARB in future and transition off BB.      Relevant Orders   Bayer DCA Hb A1c Waived   Comprehensive metabolic panel   Senile purpura (HCC)    Chronic, stable, noted on exams.  Recommend continue to perform gentle skin care and monitor for skin breakdown, if present notify provider.        Endocrine   DM due to underlying condition with diabetic nephropathy (HCC) - Primary    Chronic, ongoing in patient with history of renal cell CA.  A1c trend up mildly today to 6.6%, although remains at goal.  Did not tolerate Trulicity, GI issues.  Marcelline Deist tried with GI issues presenting.  - Continue Metformin 1000 MG BID, Januvia 100 MG daily ,and low dose Amaryl, which he has taken in past -- 0.5 MG and ensure no frequent BS <70, he is aware to notify provider if this presents.  Goal to avoid insulin if possible. - Monitor BS daily in morning at home.  - Eye exam not up to date, recommend he obtain.  Foot exam up to date - Consider change from BB to ACE/ARB in future.  Statin on board. - Return in 3 months, sooner if needed dependent on labs.      Relevant Orders   Bayer DCA Hb A1c Waived   Hyperlipidemia  associated with type 2 diabetes mellitus (HCC)    Chronic, ongoing.  Continue current medication regimen and adjust as needed.  Lipid panel today.      Relevant Orders   Bayer DCA Hb A1c Waived   Comprehensive metabolic panel   Lipid Panel w/o Chol/HDL Ratio   Neuropathy, diabetic (HCC)    To bilateral feet. A1c trend up mildly today to 6.6%, although remains at goal.  Did not tolerate Trulicity, GI issues.  Marcelline Deist tried with GI issues presenting.  - Continue Metformin 1000 MG BID, Januvia 100 MG daily ,and low dose Amaryl, which he has taken in past -- 0.5 MG and ensure no frequent BS <70, he is aware to notify provider if this presents.  Goal to avoid insulin if possible. - Increase Gabapentin to 600 MG BID and 900 MG at night (current CrCl 85) and restart Duloxetine. - Monitor BS daily in morning at home.  - Eye exam not up to date, recommend he obtain.  Foot exam up to date - Consider change from BB to ACE/ARB in future.  Statin on board. - Return in 3 months, sooner if needed dependent on labs.        Musculoskeletal and Integument   Rheumatoid arthritis of multiple sites with negative rheumatoid factor (HCC)    Chronic,  ongoing.  Followed by rheumatology with improved functional status.  Continue this collaboration and current regimen as prescribed by them.  Recent note reviewed.  Discussed that he can take occasional Tylenol as needed for back pain, max dosing 3000 MG daily.        Other   Elevated PSA    Continue collaboration with urology.  Recent note reviewed.  PSA monitored by them.        Personal history of renal cancer    Followed by urology, continue this collaboration.  Recent notes reviewed.        Follow up plan: Return in about 3 months (around 03/22/2023) for T2DM, HTN/HLD, RA, Neuropathy + medicare wellness due after 02/05/23 with nurse.

## 2022-12-21 LAB — COMPREHENSIVE METABOLIC PANEL
ALT: 16 IU/L (ref 0–44)
AST: 20 IU/L (ref 0–40)
Albumin: 3.9 g/dL (ref 3.8–4.8)
Alkaline Phosphatase: 76 IU/L (ref 44–121)
BUN/Creatinine Ratio: 19 (ref 10–24)
BUN: 16 mg/dL (ref 8–27)
Bilirubin Total: 0.6 mg/dL (ref 0.0–1.2)
CO2: 23 mmol/L (ref 20–29)
Calcium: 8.8 mg/dL (ref 8.6–10.2)
Chloride: 98 mmol/L (ref 96–106)
Creatinine, Ser: 0.83 mg/dL (ref 0.76–1.27)
Globulin, Total: 2 g/dL (ref 1.5–4.5)
Glucose: 161 mg/dL — ABNORMAL HIGH (ref 70–99)
Potassium: 4.4 mmol/L (ref 3.5–5.2)
Sodium: 137 mmol/L (ref 134–144)
Total Protein: 5.9 g/dL — ABNORMAL LOW (ref 6.0–8.5)
eGFR: 89 mL/min/{1.73_m2} (ref >59)

## 2022-12-21 LAB — LIPID PANEL W/O CHOL/HDL RATIO
Cholesterol, Total: 100 mg/dL (ref 100–199)
HDL: 34 mg/dL — ABNORMAL LOW (ref >39)
LDL Chol Calc (NIH): 42 mg/dL (ref 0–99)
Triglycerides: 135 mg/dL (ref 0–149)
VLDL Cholesterol Cal: 24 mg/dL (ref 5–40)

## 2022-12-21 NOTE — Progress Notes (Signed)
Contacted via MyChart  Good evening Jason Contreras, your labs have returned. Kidney function, creatinine and eGFR, remains normal, as is liver function, AST and ALT. Cholesterol labs are at goal.  Continue all medications as ordered.  Any questions? Keep being amazing!!  Thank you for allowing me to participate in your care.  I appreciate you. Kindest regards, Aryan Bello

## 2022-12-29 ENCOUNTER — Encounter: Payer: Self-pay | Admitting: Nurse Practitioner

## 2022-12-30 NOTE — Telephone Encounter (Signed)
Called patient verbalized Jason Contreras's message (over the counter medications like Diabetic Tussin or Coricidin). Patient received and understood message.

## 2023-01-03 ENCOUNTER — Other Ambulatory Visit: Payer: Self-pay | Admitting: Nurse Practitioner

## 2023-01-03 DIAGNOSIS — E0821 Diabetes mellitus due to underlying condition with diabetic nephropathy: Secondary | ICD-10-CM

## 2023-01-06 ENCOUNTER — Other Ambulatory Visit: Payer: Self-pay | Admitting: Nurse Practitioner

## 2023-01-06 DIAGNOSIS — I152 Hypertension secondary to endocrine disorders: Secondary | ICD-10-CM

## 2023-01-07 NOTE — Telephone Encounter (Signed)
Unable to refill per protocol, Rx request is too soon. Last refill 12/20/22 for 90 days.  Requested Prescriptions  Pending Prescriptions Disp Refills   metFORMIN (GLUCOPHAGE) 500 MG tablet [Pharmacy Med Name: metFORMIN HCl 500 MG Oral Tablet] 400 tablet 2    Sig: TAKE 2 TABLETS BY MOUTH TWICE  DAILY     Endocrinology:  Diabetes - Biguanides Passed - 01/03/2023 10:02 PM      Passed - Cr in normal range and within 360 days    Creatinine, Ser  Date Value Ref Range Status  12/20/2022 0.83 0.76 - 1.27 mg/dL Final         Passed - HBA1C is between 0 and 7.9 and within 180 days    Hemoglobin A1C  Date Value Ref Range Status  12/27/2015 6.3  Final   HB A1C (BAYER DCA - WAIVED)  Date Value Ref Range Status  12/20/2022 6.6 (H) 4.8 - 5.6 % Final    Comment:             Prediabetes: 5.7 - 6.4          Diabetes: >6.4          Glycemic control for adults with diabetes: <7.0          Passed - eGFR in normal range and within 360 days    GFR calc Af Amer  Date Value Ref Range Status  06/02/2020 99 >59 mL/min/1.73 Final    Comment:    **In accordance with recommendations from the NKF-ASN Task force,**   [facility] is in the process of updating its eGFR calculation to the   2021 CKD-EPI creatinine equation that estimates kidney function   without a race variable.    GFR calc non Af Amer  Date Value Ref Range Status  06/02/2020 85 >59 mL/min/1.73 Final   eGFR  Date Value Ref Range Status  12/20/2022 89 >59 mL/min/1.73 Final         Passed - B12 Level in normal range and within 720 days    Vitamin B-12  Date Value Ref Range Status  06/12/2022 577 232 - 1,245 pg/mL Final         Passed - Valid encounter within last 6 months    Recent Outpatient Visits           2 weeks ago Diabetes mellitus due to underlying condition with diabetic nephropathy, without long-term current use of insulin (HCC)   [place] [facility] [place], [place] T, NP   3 months ago Diabetes  mellitus due to underlying condition with diabetic nephropathy, without long-term current use of insulin (HCC)   [place] [facility] [place], [place] T, NP   6 months ago Diabetes mellitus due to underlying condition with diabetic nephropathy, without long-term current use of insulin (HCC)   [facility] [place], [place] T, NP   7 months ago Subacute cough   [place] [facility] [place], Farina T, NP   9 months ago Generalized abdominal pain   [place] [place], Dorie Rank, NP       Future Appointments             In 2 months Marjie Skiff, NP [facility], [facility]   In 8 months [name], MD [facility] [place]            Passed - CBC within normal limits and completed in the last 12 months  WBC  Date Value Ref Range Status  06/12/2022 6.2 3.4 - 10.8 x10E3/uL Final  10/14/2017 5.8 4.0 - 10.5 K/uL Final   RBC  Date Value Ref Range Status  06/12/2022 4.35 4.14 - 5.80 x10E6/uL Final  10/14/2017 4.36 4.22 - 5.81 MIL/uL Final   Hemoglobin  Date Value Ref Range Status  06/12/2022 14.2 13.0 - 17.7 g/dL Final   Hematocrit  Date Value Ref Range Status  06/12/2022 42.4 37.5 - 51.0 % Final   MCHC  Date Value Ref Range Status  06/12/2022 33.5 31.5 - 35.7 g/dL Final  16/02/9603 54.0 30.0 - 36.0 g/dL Final   [facility]  Date Value Ref Range Status  06/12/2022 32.6 26.6 - 33.0 pg Final  10/14/2017 32.3 26.0 - 34.0 pg Final   MCV  Date Value Ref Range Status  06/12/2022 98 (H) 79 - 97 fL Final   No results found for: "PLTCOUNTKUC", "LABPLAT", "POCPLA" RDW  Date Value Ref Range Status  06/12/2022 13.8 11.6 - 15.4 % Final          gabapentin (NEURONTIN) 600 MG tablet [Pharmacy Med Name: Gabapentin 600 MG Oral Tablet] 300 tablet 2    Sig: TAKE 1 TABLET BY MOUTH 3 TIMES  DAILY     Neurology: Anticonvulsants - gabapentin Passed - 01/03/2023 10:02 PM       Passed - Cr in normal range and within 360 days    Creatinine, Ser  Date Value Ref Range Status  12/20/2022 0.83 0.76 - 1.27 mg/dL Final         Passed - Completed PHQ-2 or PHQ-9 in the last 360 days      Passed - Valid encounter within last 12 months    Recent Outpatient Visits           2 weeks ago Diabetes mellitus due to underlying condition with diabetic nephropathy, without long-term current use of insulin (HCC)   [place] [facility], [place] T, NP   3 months ago Diabetes mellitus due to underlying condition with diabetic nephropathy, without long-term current use of insulin (HCC)   [facility] [place], [place] T, NP   6 months ago Diabetes mellitus due to underlying condition with diabetic nephropathy, without long-term current use of insulin (HCC)   [place] [facility] [place], [place] T, NP   7 months ago Subacute cough   [place] [facility] [place], [place] T, NP   9 months ago Generalized abdominal pain   [place] [facility] [place], Dorie Rank, NP       Future Appointments             In 2 months [name], Dorie Rank, NP [place] [facility], [facility]   In 8 months [name], MD [facility] [place] [facility]

## 2023-01-07 NOTE — Telephone Encounter (Signed)
Requested medication (s) are due for refill today: routing for review  Requested medication (s) are on the active medication list: yes  Last refill:  07/26/22  Future visit scheduled: yes  Notes to clinic: Clarify dose directions, should patient take twice a day or 3 times a day. Routing for approval.     Requested Prescriptions  Pending Prescriptions Disp Refills   metFORMIN (GLUCOPHAGE) 500 MG tablet [Pharmacy Med Name: metFORMIN HCl 500 MG Oral Tablet] 400 tablet 2    Sig: TAKE 2 TABLETS BY MOUTH TWICE  DAILY     Endocrinology:  Diabetes - Biguanides Passed - 01/03/2023 10:02 PM      Passed - Cr in normal range and within 360 days    Creatinine, Ser  Date Value Ref Range Status  12/20/2022 0.83 0.76 - 1.27 mg/dL Final         Passed - HBA1C is between 0 and 7.9 and within 180 days    Hemoglobin A1C  Date Value Ref Range Status  12/27/2015 6.3  Final   HB A1C (BAYER DCA - WAIVED)  Date Value Ref Range Status  12/20/2022 6.6 (H) 4.8 - 5.6 % Final    Comment:             Prediabetes: 5.7 - 6.4          Diabetes: >6.4          Glycemic control for adults with diabetes: <7.0          Passed - eGFR in normal range and within 360 days    GFR calc Af Amer  Date Value Ref Range Status  06/02/2020 99 >59 mL/min/1.73 Final    Comment:    **In accordance with recommendations from the NKF-ASN Task force,**   [facility] is in the process of updating its eGFR calculation to the   2021 CKD-EPI creatinine equation that estimates kidney function   without a race variable.    GFR calc non Af Amer  Date Value Ref Range Status  06/02/2020 85 >59 mL/min/1.73 Final   eGFR  Date Value Ref Range Status  12/20/2022 89 >59 mL/min/1.73 Final         Passed - B12 Level in normal range and within 720 days    Vitamin B-12  Date Value Ref Range Status  06/12/2022 577 232 - 1,245 pg/mL Final         Passed - Valid encounter within last 6 months    Recent Outpatient Visits            2 weeks ago Diabetes mellitus due to underlying condition with diabetic nephropathy, without long-term current use of insulin (HCC)   [place] [place], [place] T, NP   3 months ago Diabetes mellitus due to underlying condition with diabetic nephropathy, without long-term current use of insulin (HCC)   [place] [facility] [place], [place] T, NP   6 months ago Diabetes mellitus due to underlying condition with diabetic nephropathy, without long-term current use of insulin (HCC)   [place] [facility] [place], Corrie [place] T, NP   7 months ago Subacute cough   [place] [facility] [place], [place] T, NP   9 months ago Generalized abdominal pain   [place] [facility] [place], Dorie Rank, NP       Future Appointments             In 2 months [name], Dorie Rank, NP [facility]   In 8 months [name], MD [facility] [place]            Passed - CBC within normal limits and completed in the last 12 months    WBC  Date Value Ref Range Status  06/12/2022 6.2 3.4 - 10.8 x10E3/uL Final  10/14/2017 5.8 4.0 - 10.5 K/uL Final   RBC  Date Value Ref Range Status  06/12/2022 4.35 4.14 - 5.80 x10E6/uL Final  10/14/2017 4.36 4.22 - 5.81 MIL/uL Final   Hemoglobin  Date Value Ref Range Status  06/12/2022 14.2 13.0 - 17.7 g/dL Final   Hematocrit  Date Value Ref Range Status  06/12/2022 42.4 37.5 - 51.0 % Final   MCHC  Date Value Ref Range Status  06/12/2022 33.5 31.5 - 35.7 g/dL Final  86/57/8469 62.9 30.0 - 36.0 g/dL Final   [facility]  Date Value Ref Range Status  06/12/2022 32.6 26.6 - 33.0 pg Final  10/14/2017 32.3 26.0 - 34.0 pg Final   MCV  Date Value Ref Range Status  06/12/2022 98 (H) 79 - 97 fL Final   No results found for: "PLTCOUNTKUC", "LABPLAT", "POCPLA" RDW  Date Value Ref Range Status  06/12/2022 13.8 11.6 - 15.4 % Final         Refused  Prescriptions Disp Refills   gabapentin (NEURONTIN) 600 MG tablet [Pharmacy Med Name: Gabapentin 600 MG Oral Tablet] 300 tablet 2    Sig: TAKE 1 TABLET BY MOUTH 3 TIMES  DAILY     Neurology: Anticonvulsants - gabapentin Passed - 01/03/2023 10:02 PM      Passed - Cr in normal range and within 360 days    Creatinine, Ser  Date Value Ref Range Status  12/20/2022 0.83 0.76 - 1.27 mg/dL Final         Passed - Completed PHQ-2 or PHQ-9 in the last 360 days      Passed - Valid encounter within last 12 months    Recent Outpatient Visits           2 weeks ago Diabetes mellitus due to underlying condition with diabetic nephropathy, without long-term current use of insulin (HCC)   [place] [facility] [place], [place] T, NP   3 months ago Diabetes mellitus due to underlying condition with diabetic nephropathy, without long-term current use of insulin (HCC)   [place] [facility] [place], [place] T, NP   6 months ago Diabetes mellitus due to underlying condition with diabetic nephropathy, without long-term current use of insulin (HCC)   [place] [facility] [place], [place] T, NP   7 months ago Subacute cough   [place] [facility] [place], [place] T, NP   9 months ago Generalized abdominal pain   [place] [facility] [place], Dorie Rank, NP       Future Appointments             In 2 months Marjie Skiff, NP [place] [facility]   In 8 months [name], MD [facility]

## 2023-01-08 NOTE — Telephone Encounter (Signed)
Requested Prescriptions  Pending Prescriptions Disp Refills   sitaGLIPtin (JANUVIA) 100 MG tablet [Pharmacy Med Name: Januvia 100 MG Oral Tablet] 90 tablet 1    Sig: TAKE 1 TABLET BY MOUTH DAILY     Endocrinology:  Diabetes - DPP-4 Inhibitors Passed - 01/06/2023 11:01 PM      Passed - HBA1C is between 0 and 7.9 and within 180 days    Hemoglobin A1C  Date Value Ref Range Status  12/27/2015 6.3  Final   HB A1C (BAYER DCA - WAIVED)  Date Value Ref Range Status  12/20/2022 6.6 (H) 4.8 - 5.6 % Final    Comment:             Prediabetes: 5.7 - 6.4          Diabetes: >6.4          Glycemic control for adults with diabetes: <7.0          Passed - Cr in normal range and within 360 days    Creatinine, Ser  Date Value Ref Range Status  12/20/2022 0.83 0.76 - 1.27 mg/dL Final         Passed - Valid encounter within last 6 months    Recent Outpatient Visits           2 weeks ago Diabetes mellitus due to underlying condition with diabetic nephropathy, without long-term current use of insulin (HCC)   [place] [facility] [place], [place] T, NP   3 months ago Diabetes mellitus due to underlying condition with diabetic nephropathy, without long-term current use of insulin (HCC)   [place] [facility] [place], [place] T, NP   7 months ago Diabetes mellitus due to underlying condition with diabetic nephropathy, without long-term current use of insulin (HCC)   [place] [facility] [place], [place] T, NP   7 months ago Subacute cough   [place] [facility] [place], [place] T, NP   9 months ago Generalized abdominal pain   [place] [facility] [place], Corrie Dandy T, NP       Future Appointments             In 2 months [name], Dorie Rank, NP [place] [facility], [facility]   In 8 months [name], MD [facility] [place]             atorvastatin (LIPITOR) 20 MG tablet [Pharmacy Med Name:  Atorvastatin Calcium 20 MG Oral Tablet] 90 tablet 1    Sig: TAKE 1 TABLET BY MOUTH DAILY AT  6 PM.     Cardiovascular:  Antilipid - Statins Failed - 01/06/2023 11:01 PM      Failed - Lipid Panel in normal range within the last 12 months    Cholesterol, Total  Date Value Ref Range Status  12/20/2022 100 100 - 199 mg/dL Final   Cholesterol Piccolo, Waived  Date Value Ref Range Status  10/13/2018 101 <200 mg/dL Final    Comment:                            Desirable                <200                         Borderline High      200- 239  High                     >239    LDL Chol Calc (NIH)  Date Value Ref Range Status  12/20/2022 42 0 - 99 mg/dL Final   HDL  Date Value Ref Range Status  12/20/2022 34 (L) >39 mg/dL Final   Triglycerides  Date Value Ref Range Status  12/20/2022 135 0 - 149 mg/dL Final   Triglycerides Piccolo,Waived  Date Value Ref Range Status  10/13/2018 91 <150 mg/dL Final    Comment:                            Normal                   <150                         Borderline High     150 - 199                         High                200 - 499                         Very High                >499          Passed - Patient is not pregnant      Passed - Valid encounter within last 12 months    Recent Outpatient Visits           2 weeks ago Diabetes mellitus due to underlying condition with diabetic nephropathy, without long-term current use of insulin (HCC)   [place] [facility] [place], [place] T, NP   3 months ago Diabetes mellitus due to underlying condition with diabetic nephropathy, without long-term current use of insulin (HCC)   [place] [facility] [place], [place] T, NP   7 months ago Diabetes mellitus due to underlying condition with diabetic nephropathy, without long-term current use of insulin (HCC)   [place] [facility] [place], [place] T, NP   7 months ago Subacute  cough   [place] [facility] [place], [place] T, NP   9 months ago Generalized abdominal pain   [place] [facility] [place], Corrie Dandy T, NP       Future Appointments             In 2 months [name], Dorie Rank, NP [place] [facility], [facility]   In 8 months [name], MD [facility] [place]             tamsulosin (FLOMAX) 0.4 MG CAPS capsule [Pharmacy Med Name: Tamsulosin HCl 0.4 MG Oral Capsule] 90 capsule 1    Sig: TAKE 1 CAPSULE BY MOUTH DAILY     Urology: Alpha-Adrenergic Blocker Failed - 01/06/2023 11:01 PM      Failed - PSA in normal range and within 360 days    Prostate Specific Ag, Serum  Date Value Ref Range Status  09/16/2022 4.1 (H) 0.0 - 4.0 ng/mL Final    Comment:    Roche ECLIA methodology. According to the American Urological Association, Serum PSA should decrease and remain at undetectable levels after radical prostatectomy. The  AUA defines biochemical recurrence as an initial PSA value 0.2 ng/mL or greater followed by a subsequent confirmatory PSA value 0.2 ng/mL or greater. Values obtained with different assay methods or kits cannot be used interchangeably. Results cannot be interpreted as absolute evidence of the presence or absence of malignant disease.          Passed - Last BP in normal range    BP Readings from Last 1 Encounters:  12/20/22 109/62         Passed - Valid encounter within last 12 months    Recent Outpatient Visits           2 weeks ago Diabetes mellitus due to underlying condition with diabetic nephropathy, without long-term current use of insulin (HCC)   [place] [facility] [place], [place] T, NP   3 months ago Diabetes mellitus due to underlying condition with diabetic nephropathy, without long-term current use of insulin (HCC)   [place] [facility] [place], Egan T, NP   7 months ago Diabetes mellitus due to underlying condition with  diabetic nephropathy, without long-term current use of insulin (HCC)   [place] [facility] [place], [place] T, NP   7 months ago Subacute cough   [place] [place], [place] T, NP   9 months ago Generalized abdominal pain   [place] [facility] [place], Corrie Dandy T, NP       Future Appointments             In 2 months [place], Dorie Rank, NP [place] [facility]   In 8 months [name], MD [facility] [place]             carvedilol (COREG) 6.25 MG tablet [Pharmacy Med Name: Carvedilol 6.25 MG Oral Tablet] 180 tablet 1    Sig: TAKE 1 TABLET BY MOUTH TWICE  DAILY     Cardiovascular: Beta Blockers 3 Passed - 01/06/2023 11:01 PM      Passed - Cr in normal range and within 360 days    Creatinine, Ser  Date Value Ref Range Status  12/20/2022 0.83 0.76 - 1.27 mg/dL Final         Passed - AST in normal range and within 360 days    AST  Date Value Ref Range Status  12/20/2022 20 0 - 40 IU/L Final   AST (SGOT) Piccolo, Waived  Date Value Ref Range Status  10/13/2018 16 11 - 38 U/L Final         Passed - ALT in normal range and within 360 days    ALT  Date Value Ref Range Status  12/20/2022 16 0 - 44 IU/L Final   ALT (SGPT) Piccolo, Waived  Date Value Ref Range Status  10/13/2018 19 10 - 47 U/L Final         Passed - Last BP in normal range    BP Readings from Last 1 Encounters:  12/20/22 109/62         Passed - Last Heart Rate in normal range    Pulse Readings from Last 1 Encounters:  12/20/22 88         Passed - Valid encounter within last 6 months    Recent Outpatient Visits           2 weeks ago Diabetes mellitus due to underlying condition with diabetic nephropathy, without long-term current use of insulin (HCC)    [facility]  Aura Dials T, NP   3 months ago Diabetes mellitus due to underlying condition with diabetic nephropathy, without  long-term current use of insulin (HCC)   [place] [facility] [place], [place] T, NP   7 months ago Diabetes mellitus due to underlying condition with diabetic nephropathy, without long-term current use of insulin (HCC)   [place] [facility] [place], [place] T, NP   7 months ago Subacute cough   [facility] [place], [place] T, NP   9 months ago Generalized abdominal pain   [facility] [place], Dorie Rank, NP       Future Appointments             In 2 months [name], Dorie Rank, NP [place] [facility], [facility]   In 8 months [name], MD [facility]

## 2023-02-12 DIAGNOSIS — H2513 Age-related nuclear cataract, bilateral: Secondary | ICD-10-CM | POA: Diagnosis not present

## 2023-02-12 LAB — HM DIABETES EYE EXAM

## 2023-02-14 ENCOUNTER — Encounter: Payer: Self-pay | Admitting: Nurse Practitioner

## 2023-02-17 ENCOUNTER — Other Ambulatory Visit: Payer: Self-pay | Admitting: Nurse Practitioner

## 2023-02-17 NOTE — Telephone Encounter (Signed)
Requested medications are due for refill today.  yes  Requested medications are on the active medications list.  yes  Last refill. 12/09/2022 #100 0 rf  Future visit scheduled.   yes  Notes to clinic.  Abnormal labs.    Requested Prescriptions  Pending Prescriptions Disp Refills   finasteride (PROSCAR) 5 MG tablet [Pharmacy Med Name: Finasteride 5 MG Oral Tablet] 100 tablet 2    Sig: TAKE 1 TABLET BY MOUTH DAILY     Urology: 5-alpha Reductase Inhibitors Failed - 02/17/2023 12:45 AM      Failed - PSA in normal range and within 360 days    Prostate Specific Ag, Serum  Date Value Ref Range Status  09/16/2022 4.1 (H) 0.0 - 4.0 ng/mL Final    Comment:    Roche ECLIA methodology. According to the American Urological Association, Serum PSA should decrease and remain at undetectable levels after radical prostatectomy. The AUA defines biochemical recurrence as an initial PSA value 0.2 ng/mL or greater followed by a subsequent confirmatory PSA value 0.2 ng/mL or greater. Values obtained with different assay methods or kits cannot be used interchangeably. Results cannot be interpreted as absolute evidence of the presence or absence of malignant disease.          Passed - Valid encounter within last 12 months    Recent Outpatient Visits           1 month ago Diabetes mellitus due to underlying condition with diabetic nephropathy, without long-term current use of insulin (HCC)   [place] [facility] [place], [place] T, NP   5 months ago Diabetes mellitus due to underlying condition with diabetic nephropathy, without long-term current use of insulin (HCC)   [place] [facility] [place], [place] T, NP   8 months ago Diabetes mellitus due to underlying condition with diabetic nephropathy, without long-term current use of insulin (HCC)   [place] [facility] [place], Corrie Dandy T, NP   9 months ago Subacute cough   [place] [facility] [place], [place] T, NP   10 months ago Generalized abdominal pain   [place] [facility] [place], Dorie Rank, NP       Future Appointments             In 1 month [place], Dorie Rank, NP  [facility]   In 7 months [name], MD [facility]

## 2023-03-04 ENCOUNTER — Ambulatory Visit: Payer: Medicare Other | Admitting: Emergency Medicine

## 2023-03-04 VITALS — Ht 69.0 in | Wt 202.0 lb

## 2023-03-04 DIAGNOSIS — Z0000 Encounter for general adult medical examination without abnormal findings: Secondary | ICD-10-CM | POA: Diagnosis not present

## 2023-03-04 NOTE — Patient Instructions (Addendum)
Jason Contreras , Thank you for taking time to come for your Medicare Wellness Visit. I appreciate your ongoing commitment to your health goals. Please review the following plan we discussed and let me know if I can assist you in the future.   Referrals/Orders/Follow-Ups/Clinician Recommendations: Get the flu shot at your earliest convenience.  This is a list of the screening recommended for you and due dates:  Health Maintenance  Topic Date Due   COVID-19 Vaccine (5 - 2023-24 season) 01/05/2023   Flu Shot  08/04/2023*   Yearly kidney health urinalysis for diabetes  06/13/2023   Complete foot exam   06/13/2023   Hemoglobin A1C  06/22/2023   Yearly kidney function blood test for diabetes  12/20/2023   Eye exam for diabetics  02/12/2024   Medicare Annual Wellness Visit  03/03/2024   DTaP/Tdap/Td vaccine (3 - Tdap) 09/04/2031   Pneumonia Vaccine  Completed   Zoster (Shingles) Vaccine  Completed   HPV Vaccine  Aged Out   Colon Cancer Screening  Discontinued   Hepatitis C Screening  Discontinued  *Topic was postponed. The date shown is not the original due date.    Advanced directives: (Copy Requested) Please bring a copy of your health care power of attorney and living will to the office to be added to your chart at your convenience.  Next Medicare Annual Wellness Visit scheduled for next year: Yes, 03/09/24 @ 12:30pm

## 2023-03-04 NOTE — Progress Notes (Signed)
Subjective:   Jason Contreras is a 82 y.o. male who presents for Medicare Annual/Subsequent preventive examination.  Visit Complete: Virtual I connected with  [name] on 03/04/23 by a audio enabled telemedicine application and verified that I am speaking with the correct person using two identifiers.  Patient Location: Home  Provider Location: Office/Clinic  I discussed the limitations of evaluation and management by telemedicine. The patient expressed understanding and agreed to proceed.  Vital Signs: Because this visit was a virtual/telehealth visit, some criteria may be missing or patient reported. Any vitals not documented were not able to be obtained and vitals that have been documented are patient reported.   Cardiac Risk Factors include: advanced age (>63men, >48 women);male gender;diabetes mellitus;dyslipidemia;hypertension     Objective:    Today's Vitals   03/04/23 1332  Weight: 202 lb (91.6 kg)  Height: 5\' 9"  (1.753 m)   Body mass index is 29.83 kg/m.     03/04/2023    1:50 PM 02/02/2021    2:56 PM 01/31/2020    1:04 PM 01/21/2019   10:04 AM 01/08/2018    1:43 PM 10/17/2017   12:33 PM 10/17/2017    7:02 AM  Advanced Directives  Does Patient Have a Medical Advance Directive? Yes Yes Yes Yes Yes No No  Type of Estate agent of [place];Living will Healthcare Power of [place];Living will Healthcare Power of [place];Living will Living will;Healthcare Power of [employer] Power of [place];Living will    Does patient want to make changes to medical advance directive? No - Patient declined        Copy of Healthcare Power of Attorney in Chart? No - copy requested No - copy requested No - copy requested No - copy requested No - copy requested    Would patient like information on creating a medical advance directive?      No - Patient declined No - Patient declined    Current Medications (verified) Outpatient Encounter Medications as of  03/04/2023  Medication Sig   acetaminophen (TYLENOL) 325 MG tablet Take 650 mg by mouth every 6 (six) hours as needed. Takes at bedtime and occasionally in am for back/hip pain   atorvastatin (LIPITOR) 20 MG tablet TAKE 1 TABLET BY MOUTH DAILY AT  6 PM.   B Complex-C-Folic Acid (SUPER B COMPLEX/FA/VIT C PO) Take 1 tablet by mouth daily. Contains 400 mcg folic acid   carvedilol (COREG) 6.25 MG tablet TAKE 1 TABLET BY MOUTH TWICE  DAILY   cholecalciferol (VITAMIN D3) 25 MCG (1000 UNIT) tablet Take 2,000 Units by mouth daily.    DULoxetine (CYMBALTA) 60 MG capsule Take 1 capsule (60 mg total) by mouth daily.   finasteride (PROSCAR) 5 MG tablet TAKE 1 TABLET BY MOUTH DAILY   folic acid (FOLVITE) 1 MG tablet Take 1 tablet (1 mg total) by mouth daily.   gabapentin (NEURONTIN) 600 MG tablet Take 600 MG (1 tablet) by mouth at 0800 and 1300 hours, then before bedtime take 900 MG (1 1/2 tablets) by mouth.   glimepiride (AMARYL) 1 MG tablet Take 0.5 tablets (0.5 mg total) by mouth daily with breakfast.   lidocaine (LMX) 4 % cream Apply 1 application topically as needed.   metFORMIN (GLUCOPHAGE) 500 MG tablet TAKE 2 TABLETS BY MOUTH TWICE  DAILY   methotrexate (RHEUMATREX) 2.5 MG tablet Take 6 of the 2.5 MG tablets by mouth once a week per rheumatology instruction.  Caution:Chemotherapy. Protect from light.   mometasone (ELOCON) 0.1 %  cream APPLY  CREAM TOPICALLY ONCE DAILY   sitaGLIPtin (JANUVIA) 100 MG tablet TAKE 1 TABLET BY MOUTH DAILY   tamsulosin (FLOMAX) 0.4 MG CAPS capsule TAKE 1 CAPSULE BY MOUTH DAILY   vitamin C (ASCORBIC ACID) 500 MG tablet Take 500 mg by mouth daily.   No facility-administered encounter medications on file as of 03/04/2023.    Allergies (verified) Dapagliflozin, Morphine, and Trulicity [dulaglutide]   History: Past Medical History:  Diagnosis Date   Adenocarcinoma, renal cell (HCC)    Arthritis    Collagen vascular disease (HCC)    Diabetes mellitus without  complication (HCC)    Elevated PSA    Headache    Hematuria    Hyperlipidemia    Hypertension    Neuropathy    Obesity    PONV (postoperative nausea and vomiting)    Renal insufficiency    Right renal mass    Past Surgical History:  Procedure Laterality Date   APPENDECTOMY     CRYOABLATION  10/17/2017   IR RADIOLOGIST EVAL & MGMT  07/17/2017   IR RADIOLOGIST EVAL & MGMT  08/13/2017   IR RADIOLOGIST EVAL & MGMT  11/12/2017   IR RADIOLOGIST EVAL & MGMT  03/05/2018   RADIOLOGY WITH ANESTHESIA Right 10/17/2017   Procedure: CT WITH ANESTHESIA RENAL CRYOABLATION;  Surgeon: Berdine Dance, MD;  Location: [facility];  Service: Anesthesiology;  Laterality: Right;   ROBOTIC ASSITED PARTIAL NEPHRECTOMY Right 12/12/2015   Procedure: ROBOTIC ASSITED PARTIAL NEPHRECTOMY;  Surgeon: [name], MD;  Location: [facility] OR[facility];  Service: Urology;  Laterality: Right;   Family History  Problem Relation Age of Onset   Bone cancer Mother    Cancer Mother    Cancer Father    Stroke Brother    Kidney disease Neg Hx    Social History   Socioeconomic History   Marital status: Married    Spouse name: Stage manager   Number of children: 2   Years of education: 3 years college    Highest education level: Associate degree: occupational, Scientist, product/process development, or vocational program  Occupational History   Not on file  Tobacco Use   Smoking status: Never   Smokeless tobacco: Never  Vaping Use   Vaping status: Never Used  Substance and Sexual Activity   Alcohol use: No   Drug use: No   Sexual activity: Not Currently  Other Topics Concern   Not on file  Social History Narrative   Working part time [employer] store   Social Determinants of Health   Financial Resource Strain: Low Risk  (03/04/2023)   Overall Financial Resource Strain (CARDIA)    Difficulty of Paying Living Expenses: Not hard at all  Food Insecurity: No Food Insecurity (03/04/2023)   Hunger Vital Sign    Worried About Running Out of Food in the Last Year: Never  true    Ran Out of Food in the Last Year: Never true  Transportation Needs: No Transportation Needs (03/04/2023)   PRAPARE - Administrator, Civil Service (Medical): No    Lack of Transportation (Non-Medical): No  Physical Activity: Inactive (03/04/2023)   Exercise Vital Sign    Days of Exercise per Week: 0 days    Minutes of Exercise per Session: 0 min  Stress: No Stress Concern Present (03/04/2023)   [employer] of Occupational Health - Occupational Stress Questionnaire    Feeling of Stress : Not at all  Social Connections: Moderately Integrated (03/04/2023)   Social Connection and Isolation Panel [NHANES]  Frequency of Communication with Friends and Family: More than three times a week    Frequency of Social Gatherings with Friends and Family: Twice a week    Attends Religious Services: 1 to 4 times per year    Active Member of [employer] or Organizations: No    Attends Engineer, structural: Never    Marital Status: Married    Tobacco Counseling Counseling given: Not Answered   Clinical Intake:  Pre-visit preparation completed: Yes  Pain : No/denies pain     BMI - recorded: 29.83 Nutritional Status: BMI 25 -29 Overweight Nutritional Risks: None Diabetes: Yes CBG done?: No Did pt. bring in CBG monitor from home?: No  How often do you need to have someone help you when you read instructions, pamphlets, or other written materials from your doctor or pharmacy?: 1 - Never  Interpreter Needed?: No  Information entered by :: Tora Kindred, CMA   Activities of Daily Living    03/04/2023    1:37 PM 06/12/2022    2:44 PM  In your present state of health, do you have any difficulty performing the following activities:  Hearing? 0 0  Vision? 0 0  Difficulty concentrating or making decisions? 0 0  Walking or climbing stairs? 0 0  Dressing or bathing? 0 0  Doing errands, shopping? 0 0  Preparing Food and eating ? N   Using the Toilet? N   In the  past six months, have you accidently leaked urine? N   Do you have problems with loss of bowel control? N   Managing your Medications? N   Managing your Finances? N   Housekeeping or managing your Housekeeping? N     Patient Care Team: Marjie Skiff, NP as PCP - General (Nurse Practitioner) [name], MD as Consulting Physician (Urology) Berdine Dance, MD as Consulting Physician (Interventional Radiology) Lajean Manes, [facility] (Inactive) as Pharmacist (Pharmacist) Zettie Pho, [facility] (Inactive) (Pharmacist) Kemper Durie, RN as [facility] Care Management Brooke Dare, Earl Gala, MD as Consulting Physician (Ophthalmology)  Indicate any recent Medical Services you may have received from other than [facility] providers in the past year (date may be approximate).     Assessment:   This is a routine wellness examination for [facility].  Hearing/Vision screen Hearing Screening - Comments:: Denies hearing loss Vision Screening - Comments:: Gets eye exams   Goals Addressed               This Visit's Progress     Patient Stated (pt-stated)        Continue to stay active and eat healthy      Depression Screen    03/04/2023    1:48 PM 12/20/2022    1:27 PM 09/18/2022    2:19 PM 06/12/2022    2:33 PM 05/17/2022    1:54 PM 03/27/2022    1:50 PM 02/04/2022   11:48 AM  PHQ 2/9 Scores  PHQ - 2 Score 0 0 0 0 0 0 0  PHQ- 9 Score 0 0 0 0 0 0 0    Fall Risk    03/04/2023    1:51 PM 12/20/2022    1:27 PM 09/18/2022    2:19 PM 06/12/2022    2:44 PM 06/12/2022    2:33 PM  Fall Risk   Falls in the past year? 0 0 0 0 0  Number falls in past yr: 0 0 0 0 0  Injury with Fall? 0 0 0 0 0  Risk for fall due to : No Fall Risks No Fall Risks No Fall Risks No Fall Risks No Fall Risks  Follow up Falls prevention discussed Falls evaluation completed Falls evaluation completed Falls prevention discussed Falls evaluation completed    MEDICARE RISK AT HOME: Medicare Risk at Home Any  stairs in or around the home?: Yes If so, are there any without handrails?: No Home free of loose throw rugs in walkways, pet beds, electrical cords, etc?: Yes Adequate lighting in your home to reduce risk of falls?: Yes Life alert?: No Use of a cane, walker or w/c?: No Grab bars in the bathroom?: Yes Shower chair or bench in shower?: Yes Elevated toilet seat or a handicapped toilet?: Yes  TIMED UP AND GO:  Was the test performed?  No    Cognitive Function:        03/04/2023    1:52 PM 02/04/2022   11:44 AM 02/02/2021    2:58 PM 01/31/2020    1:07 PM 01/08/2018    2:10 PM  6CIT Screen  What Year? 0 points 0 points 0 points 0 points 0 points  What month? 0 points 0 points 0 points 0 points 0 points  What time? 0 points 0 points 0 points 0 points 0 points  Count back from 20 0 points 0 points 0 points 0 points 0 points  Months in reverse 0 points 0 points 0 points 0 points 0 points  Repeat phrase 0 points 0 points 0 points 0 points 0 points  Total Score 0 points 0 points 0 points 0 points 0 points    Immunizations Immunization History  Administered Date(s) Administered   Fluad Quad(high Dose 65+) 01/25/2019, 03/13/2020, 02/27/2021, 03/06/2022   Influenza, High Dose Seasonal PF 03/25/2016, 01/13/2017, 03/12/2018   Influenza,inj,Quad PF,6+ Mos 03/13/2015   Influenza-Unspecified 04/18/2014   PFIZER(Purple Top)SARS-COV-2 Vaccination 05/12/2019, 06/05/2019, 02/25/2020, 11/28/2020   Pneumococcal Conjugate-13 10/19/2013   Pneumococcal Polysaccharide-23 09/03/1997, 02/26/2005, 10/10/2019, 10/11/2019   Td 02/17/2008, 09/03/2021   Zoster Recombinant(Shingrix) 10/15/2022, 02/05/2023    TDAP status: Up to date  Flu Vaccine status: Due, Education has been provided regarding the importance of this vaccine. Advised may receive this vaccine at local pharmacy or Health Dept. Aware to provide a copy of the vaccination record if obtained from local pharmacy or Health Dept. Verbalized  acceptance and understanding.  Pneumococcal vaccine status: Up to date  Covid-19 vaccine status: Information provided on how to obtain vaccines.   Qualifies for Shingles Vaccine? Yes   Zostavax completed No   Shingrix Completed?: Yes  Screening Tests Health Maintenance  Topic Date Due   COVID-19 Vaccine (5 - 2023-24 season) 01/05/2023   INFLUENZA VACCINE  08/04/2023 (Originally 12/05/2022)   Diabetic kidney evaluation - Urine ACR  06/13/2023   FOOT EXAM  06/13/2023   HEMOGLOBIN A1C  06/22/2023   Diabetic kidney evaluation - eGFR measurement  12/20/2023   OPHTHALMOLOGY EXAM  02/12/2024   Medicare Annual Wellness (AWV)  03/03/2024   DTaP/Tdap/Td (3 - Tdap) 09/04/2031   Pneumonia Vaccine 26+ Years old  Completed   Zoster Vaccines- Shingrix  Completed   HPV VACCINES  Aged Out   Colonoscopy  Discontinued   Hepatitis C Screening  Discontinued    Health Maintenance  Health Maintenance Due  Topic Date Due   COVID-19 Vaccine (5 - 2023-24 season) 01/05/2023    Colorectal cancer screening: No longer required.   Lung Cancer Screening: (Low Dose CT Chest recommended if Age 64-80 years, 20 pack-year currently  smoking OR have quit w/in 15years.) does not qualify.   Lung Cancer Screening Referral: n/a  Additional Screening:  Hepatitis C Screening: does not qualify;   Vision Screening: Recommended annual ophthalmology exams for early detection of glaucoma and other disorders of the eye. Is the patient up to date with their annual eye exam?  Yes  Who is the provider or what is the name of the office in which the patient attends annual eye exams? Dr. Willey Blade If pt is not established with a provider, would they like to be referred to a provider to establish care? No .   Dental Screening: Recommended annual dental exams for proper oral hygiene  Diabetic Foot Exam: Diabetic Foot Exam: Completed 06/12/22  Community Resource Referral / Chronic Care Management: CRR required this  visit?  No   CCM required this visit?  No     Plan:     I have personally reviewed and noted the following in the patient's chart:   Medical and social history Use of alcohol, tobacco or illicit drugs  Current medications and supplements including opioid prescriptions. Patient is not currently taking opioid prescriptions. Functional ability and status Nutritional status Physical activity Advanced directives List of other physicians Hospitalizations, surgeries, and ER visits in previous 12 months Vitals Screenings to include cognitive, depression, and falls Referrals and appointments  In addition, I have reviewed and discussed with patient certain preventive protocols, quality metrics, and best practice recommendations. A written personalized care plan for preventive services as well as general preventive health recommendations were provided to patient.     Tora Kindred, CMA   03/04/2023   After Visit Summary: (MyChart) Due to this being a telephonic visit, the after visit summary with patients personalized plan was offered to patient via MyChart   Nurse Notes:  Declined DM & Nutrition education Declined Covid vaccine Needs flu vaccine

## 2023-03-13 ENCOUNTER — Other Ambulatory Visit: Payer: Self-pay | Admitting: Nurse Practitioner

## 2023-03-14 NOTE — Telephone Encounter (Signed)
Requested medication (s) are due for refill today- no  Requested medication (s) are on the active medication list -yes  Future visit scheduled -yes  Last refill: 04/08/22 #15 12 RF  Notes to clinic: Patient insurance is requesting change in Rx- not due RF yet- sent for review of request  Requested Prescriptions  Pending Prescriptions Disp Refills   glimepiride (AMARYL) 1 MG tablet [Pharmacy Med Name: Glimepiride 1 MG Oral Tablet] 50 tablet 2    Sig: TAKE ONE-HALF TABLET BY MOUTH  DAILY WITH BREAKFAST     Endocrinology:  Diabetes - Sulfonylureas Passed - 03/13/2023 10:49 PM      Passed - HBA1C is between 0 and 7.9 and within 180 days    Hemoglobin A1C  Date Value Ref Range Status  12/27/2015 6.3  Final   HB A1C (BAYER DCA - WAIVED)  Date Value Ref Range Status  12/20/2022 6.6 (H) 4.8 - 5.6 % Final    Comment:             Prediabetes: 5.7 - 6.4          Diabetes: >6.4          Glycemic control for adults with diabetes: <7.0          Passed - Cr in normal range and within 360 days    Creatinine, Ser  Date Value Ref Range Status  12/20/2022 0.83 0.76 - 1.27 mg/dL Final         Passed - Valid encounter within last 6 months    Recent Outpatient Visits           2 months ago Diabetes mellitus due to underlying condition with diabetic nephropathy, without long-term current use of insulin (HCC)   [place] [facility] [place], [place] T, NP   5 months ago Diabetes mellitus due to underlying condition with diabetic nephropathy, without long-term current use of insulin (HCC)   [place] [facility] [place], [place] T, NP   9 months ago Diabetes mellitus due to underlying condition with diabetic nephropathy, without long-term current use of insulin (HCC)   [place] [facility] [place], Corrie Dandy T, NP   10 months ago Subacute cough   [place] [facility] [place], [place] T, NP   11 months ago Generalized abdominal pain    [place] [facility] [place], Corrie Dandy T, NP       Future Appointments             In 1 week Marjie Skiff, NP [facility], [facility]   In 6 months [name], MD [facility] [place]               Requested Prescriptions  Pending Prescriptions Disp Refills   glimepiride (AMARYL) 1 MG tablet [Pharmacy Med Name: Glimepiride 1 MG Oral Tablet] 50 tablet 2    Sig: TAKE ONE-HALF TABLET BY MOUTH  DAILY WITH BREAKFAST     Endocrinology:  Diabetes - Sulfonylureas Passed - 03/13/2023 10:49 PM      Passed - HBA1C is between 0 and 7.9 and within 180 days    Hemoglobin A1C  Date Value Ref Range Status  12/27/2015 6.3  Final   HB A1C (BAYER DCA - WAIVED)  Date Value Ref Range Status  12/20/2022 6.6 (H) 4.8 - 5.6 % Final    Comment:             Prediabetes: 5.7 - 6.4  Diabetes: >6.4          Glycemic control for adults with diabetes: <7.0          Passed - Cr in normal range and within 360 days    Creatinine, Ser  Date Value Ref Range Status  12/20/2022 0.83 0.76 - 1.27 mg/dL Final         Passed - Valid encounter within last 6 months    Recent Outpatient Visits           2 months ago Diabetes mellitus due to underlying condition with diabetic nephropathy, without long-term current use of insulin (HCC)   [place] [facility] [place], Rochelle T, NP   5 months ago Diabetes mellitus due to underlying condition with diabetic nephropathy, without long-term current use of insulin (HCC)   [place] [facility] [place], Irwin T, NP   9 months ago Diabetes mellitus due to underlying condition with diabetic nephropathy, without long-term current use of insulin (HCC)   [place] [facility] [place], Corrie Dandy T, NP   10 months ago Subacute cough   [place] [facility] [place], [place] T, NP   11 months ago Generalized abdominal pain   [place] [facility] [place], Dorie Rank, NP       Future Appointments             In 1 week Marjie Skiff, NP [facility], [facility]   In 6 months [name], MD [facility]

## 2023-03-23 NOTE — Patient Instructions (Signed)
Be Involved in Caring For Your Health:  Taking Medications When medications are taken as directed, they can greatly improve your health. But if they are not taken as prescribed, they may not work. In some cases, not taking them correctly can be harmful. To help ensure your treatment remains effective and safe, understand your medications and how to take them. Bring your medications to each visit for review by your provider.  Your lab results, notes, and after visit summary will be available on My Chart. We strongly encourage you to use this feature. If lab results are abnormal the clinic will contact you with the appropriate steps. If the clinic does not contact you assume the results are satisfactory. You can always view your results on My Chart. If you have questions regarding your health or results, please contact the clinic during office hours. You can also ask questions on My Chart.  We at [facility] are grateful that you chose [place] to provide your care. We strive to provide evidence-based and compassionate care and are always looking for feedback. If you get a survey from the clinic please complete this so we can hear your opinions.  Diabetes Mellitus and Exercise Regular exercise is important for your health, especially if you have diabetes mellitus. Exercise is not just about losing weight. It can also help you increase muscle strength and bone density and reduce body fat and stress. This can help your level of endurance and make you more fit and flexible. Why should I exercise if I have diabetes? Exercise has many benefits for people with diabetes. It can: Help lower and control your blood sugar (glucose). Help your body respond better and become more sensitive to the hormone insulin. Reduce how much insulin your body needs. Lower your risk for heart disease by: Lowering how much "bad" cholesterol and triglycerides you have in your body. Increasing how much "good" cholesterol  you have in your body. Lowering your blood pressure. Lowering your blood glucose levels. What is my activity plan? Your health care provider or an expert trained in diabetes care (certified diabetes educator) can help you make an activity plan. This plan can help you find the type of exercise that works for you. It may also tell you how often to exercise and for how long. Be sure to: Get at least 150 minutes of medium-intensity or high-intensity exercise each week. This may involve brisk walking, biking, or water aerobics. Do stretching and strengthening exercises at least 2 times a week. This may involve yoga or weight lifting. Spread out your activity over at least 3 days of the week. Get some form of physical activity each day. Do not go more than 2 days in a row without some kind of activity. Avoid being inactive for more than 30 minutes at a time. Take frequent breaks to walk or stretch. Choose activities that you enjoy. Set goals that you know you can accomplish. Start slowly and increase the intensity of your exercise over time. How do I manage my diabetes during exercise?  Monitor your blood glucose Check your blood glucose before and after you exercise. If your blood glucose is 240 mg/dL (40.9 mmol/L) or higher before you exercise, check your urine for ketones. These are chemicals created by the liver. If you have ketones in your urine, do not exercise until your blood glucose returns to normal. If your blood glucose is 100 mg/dL (5.6 mmol/L) or lower, eat a snack that has 15-20 grams of carbohydrate in  it. Check your blood glucose 15 minutes after the snack to make sure that your level is above 100 mg/dL (5.6 mmol/L) before you start to exercise. Your risk for low blood glucose (hypoglycemia) goes up during and after exercise. Know the symptoms of this condition and how to treat it. Follow these instructions at home: Keep a carbohydrate snack on hand for use before, during, and after  exercise. This can help prevent or treat hypoglycemia. Avoid injecting insulin into parts of your body that are going to be used during exercise. This may include: Your arms, when you are going to play tennis. Your legs, when you are about to go jogging. Keep track of your exercise habits. This can help you and your health care provider watch and adjust your activity plan. Write down: What you eat before and after you exercise. Blood glucose levels before and after you exercise. The type and amount of exercise you do. Talk to your health care provider before you start a new activity. They may need to: Make sure that the activity is safe for you. Adjust your insulin, other medicines, and food that you eat. Drink water while you exercise. This can stop you from losing too much water (dehydration). It can also prevent problems caused by having a lot of heat in your body (heat stroke). Where to find more information American Diabetes Association: diabetes.org Association of Diabetes Care & Education Specialists: diabeteseducator.org This information is not intended to replace advice given to you by your health care provider. Make sure you discuss any questions you have with your health care provider. Document Revised: 10/10/2021 Document Reviewed: 10/10/2021 Elsevier Patient Education  2024 [employer].

## 2023-03-24 ENCOUNTER — Ambulatory Visit: Payer: Medicare Other | Admitting: Nurse Practitioner

## 2023-03-24 VITALS — BP 128/72 | HR 93 | Temp 97.9°F | Ht 68.0 in | Wt 205.0 lb

## 2023-03-24 DIAGNOSIS — E785 Hyperlipidemia, unspecified: Secondary | ICD-10-CM

## 2023-03-24 DIAGNOSIS — M0609 Rheumatoid arthritis without rheumatoid factor, multiple sites: Secondary | ICD-10-CM | POA: Diagnosis not present

## 2023-03-24 DIAGNOSIS — D692 Other nonthrombocytopenic purpura: Secondary | ICD-10-CM

## 2023-03-24 DIAGNOSIS — E1141 Type 2 diabetes mellitus with diabetic mononeuropathy: Secondary | ICD-10-CM

## 2023-03-24 DIAGNOSIS — Z7984 Long term (current) use of oral hypoglycemic drugs: Secondary | ICD-10-CM

## 2023-03-24 DIAGNOSIS — Z85528 Personal history of other malignant neoplasm of kidney: Secondary | ICD-10-CM

## 2023-03-24 DIAGNOSIS — E1159 Type 2 diabetes mellitus with other circulatory complications: Secondary | ICD-10-CM

## 2023-03-24 DIAGNOSIS — E1169 Type 2 diabetes mellitus with other specified complication: Secondary | ICD-10-CM | POA: Diagnosis not present

## 2023-03-24 DIAGNOSIS — I152 Hypertension secondary to endocrine disorders: Secondary | ICD-10-CM | POA: Diagnosis not present

## 2023-03-24 DIAGNOSIS — Z23 Encounter for immunization: Secondary | ICD-10-CM | POA: Diagnosis not present

## 2023-03-24 DIAGNOSIS — E0821 Diabetes mellitus due to underlying condition with diabetic nephropathy: Secondary | ICD-10-CM

## 2023-03-24 LAB — BAYER DCA HB A1C WAIVED: HB A1C (BAYER DCA - WAIVED): 6.3 % — ABNORMAL HIGH (ref 4.8–5.6)

## 2023-03-24 NOTE — Assessment & Plan Note (Signed)
Chronic, ongoing.  Continue current medication regimen and adjust as needed. Lipid panel today. 

## 2023-03-24 NOTE — Progress Notes (Signed)
BP 128/72 (BP Location: Left Arm, Patient Position: Sitting, Cuff Size: Normal)   Pulse 93   Temp 97.9 F (36.6 C) (Oral)   Ht 5\' 8"  (1.727 m)   Wt 205 lb (93 kg)   SpO2 96%   BMI 31.17 kg/m    Subjective:    Patient ID: Jason Contreras, male    DOB: [birth year], 82 y.o.   MRN: [id number]  HPI: Jason Contreras is a 82 y.o. male  Chief Complaint  Patient presents with   Diabetes   Hyperlipidemia   Hypertension   Rheumatoid Arthritis   DIABETES A1c August 6.6%.  Taking Metformin 1000 MG BID, Glimepiride 0.5 MG daily, and Januvia 100 MG daily.  Ate lots of good foods recently due to two birthdays and an anniversary.   Takes Gabapentin 600 MG BID and 900 MG at night, Duloxetine, and B12 for neuropathy.  Continues to have issues with this on occasion at night. Hypoglycemic episodes:no Polydipsia/polyuria: no Visual disturbance: no Chest pain: no Paresthesias: no Glucose Monitoring: no             Accucheck frequency: rarely             Fasting glucose: 145 this morning             Post prandial:             Evening:             Before meals: Taking Insulin?: no             Long acting insulin:             Short acting insulin: Blood Pressure Monitoring: a few times a week Retinal Examination: Up To Date Foot Exam: Up To Date Pneumovax: Up To Date Influenza: Up to Date Aspirin: no    HYPERTENSION / HYPERLIPIDEMIA Continues on Atorvastatin 20 MG and Coreg 6.25 MG BID. Satisfied with current treatment? yes Duration of hypertension: chronic BP monitoring frequency: a few times a week BP range:  BP medication side effects: no Duration of hyperlipidemia: chronic Cholesterol medication side effects: no Cholesterol supplements: none Medication compliance: good compliance Aspirin: no Recent stressors: no Recurrent headaches: no Visual changes: no Palpitations: no Dyspnea: no Chest pain: no Lower extremity edema: no Dizzy/lightheaded: no    RHEUMATOID  ARTHRITIS Follows with rheumatology for RA of multiple sites with negative Rf, with last visit 12/11/22.  Much improved since taking Methotrexate.   Pain control status: stable Duration: chronic Location: multiple areas What Activities task can be accomplished with current medication? ADL's daily Previous pain specialty evaluation: yes Non-narcotic analgesic meds: yes   ELEVATED PSA: Follows with urology, last visit 09/17/22 -- returns in one year for CT scan and testing.  PSA 4.1 at last visit. Had negative biopsy.  Has history of kidney cancer to right side, had two operations on this.  Continues on Tamsulosin and Finasteride.   Relevant past medical, surgical, family and social history reviewed and updated as indicated. Interim medical history since our last visit reviewed. Allergies and medications reviewed and updated.  Review of Systems  Constitutional:  Negative for activity change, diaphoresis, fatigue and fever.  Respiratory:  Negative for cough, chest tightness, shortness of breath and wheezing.   Cardiovascular:  Negative for chest pain, palpitations and leg swelling.  Gastrointestinal: Negative.   Endocrine: Negative for polydipsia, polyphagia and polyuria.  Neurological:  Negative for dizziness, syncope, weakness and numbness.  Psychiatric/Behavioral: Negative.  Per HPI unless specifically indicated above     Objective:    BP 128/72 (BP Location: Left Arm, Patient Position: Sitting, Cuff Size: Normal)   Pulse 93   Temp 97.9 F (36.6 C) (Oral)   Ht 5\' 8"  (1.727 m)   Wt 205 lb (93 kg)   SpO2 96%   BMI 31.17 kg/m   Wt Readings from Last 3 Encounters:  03/24/23 205 lb (93 kg)  03/04/23 202 lb (91.6 kg)  12/20/22 202 lb (91.6 kg)    Physical Exam Vitals and nursing note reviewed.  Constitutional:      General: He is awake. He is not in acute distress.    Appearance: He is well-developed and well-groomed. He is obese. He is not ill-appearing or toxic-appearing.   HENT:     Head: Normocephalic and atraumatic.     Right Ear: Hearing normal. No drainage.     Left Ear: Hearing normal. No drainage.  Eyes:     General: Lids are normal.        Right eye: No discharge.        Left eye: No discharge.     Conjunctiva/sclera: Conjunctivae normal.     Pupils: Pupils are equal, round, and reactive to light.  Neck:     Thyroid: No thyromegaly.     Vascular: No carotid bruit or JVD.     Trachea: Trachea normal.  Cardiovascular:     Rate and Rhythm: Normal rate and regular rhythm.     Heart sounds: Normal heart sounds, S1 normal and S2 normal. No murmur heard.    No gallop.  Pulmonary:     Effort: Pulmonary effort is normal. No accessory muscle usage or respiratory distress.     Breath sounds: Normal breath sounds.  Abdominal:     General: Bowel sounds are normal. There is no distension.     Palpations: Abdomen is soft. There is no hepatomegaly.     Tenderness: There is no abdominal tenderness. There is no right CVA tenderness or left CVA tenderness.     Hernia: No hernia is present.  Musculoskeletal:        General: Normal range of motion.     Cervical back: Normal range of motion and neck supple.     Right lower leg: No edema.     Left lower leg: No edema.  Skin:    General: Skin is warm and dry.     Capillary Refill: Capillary refill takes less than 2 seconds.     Comments: Scattered pale purple/yellow bruises noted to BUE.  Neurological:     Mental Status: He is alert and oriented to person, place, and time.     Coordination: Coordination is intact.     Gait: Gait is intact.     Deep Tendon Reflexes: Reflexes are normal and symmetric.     Reflex Scores:      Brachioradialis reflexes are 2+ on the right side and 2+ on the left side.      Patellar reflexes are 2+ on the right side and 2+ on the left side. Psychiatric:        Mood and Affect: Mood normal.        Behavior: Behavior normal. Behavior is cooperative.        Thought Content:  Thought content normal.        Judgment: Judgment normal.    Results for orders placed or performed in visit on 02/14/23  HM DIABETES EYE EXAM  Result  Value Ref Range   HM Diabetic Eye Exam No Retinopathy No Retinopathy      Assessment & Plan:   Problem List Items Addressed This Visit       Cardiovascular and Mediastinum   Hypertension associated with diabetes (HCC)    Chronic, stable.  BP at goal for age in office today on recheck, avoid hypotension due to age.  Recommend he monitor BP at home at least three mornings a week + focus on DASH diet.  Continue current medication regimen and adjust as needed.  Labs: A1c.  Urine ALB 10 June 2022.  May benefit addition of ACE or ARB in future and transition off BB.      Relevant Orders   Bayer DCA Hb A1c Waived   Senile purpura (HCC)    Chronic, stable, noted on exams.  Recommend continue to perform gentle skin care and monitor for skin breakdown, if present notify provider.        Endocrine   DM due to underlying condition with diabetic nephropathy (HCC) - Primary    Chronic, ongoing in patient with history of renal cell CA.  A1c trend down today to 6.3%, remains at goal.  Did not tolerate Trulicity, GI issues.  Marcelline Deist tried with GI issues presenting.  - Continue Metformin 1000 MG BID, Januvia 100 MG daily ,and low dose Amaryl, which he has taken in past -- 0.5 MG and ensure no frequent BS <70, he is aware to notify provider if this presents.  Goal to avoid insulin if possible. - Monitor BS daily in morning at home.  - Eye exam up to date.  Foot exam up to date - Consider change from BB to ACE/ARB in future.  Statin on board. - Return in 3 months, sooner if needed dependent on labs.      Relevant Orders   Bayer DCA Hb A1c Waived   Hyperlipidemia associated with type 2 diabetes mellitus (HCC)    Chronic, ongoing.  Continue current medication regimen and adjust as needed.  Lipid panel today.      Relevant Orders   Bayer DCA Hb  A1c Waived   Neuropathy, diabetic (HCC)    To bilateral feet. A1c trend down today to 6.3%, remains at goal.  Did not tolerate Trulicity, GI issues.  Marcelline Deist tried with GI issues presenting.  - Continue Metformin 1000 MG BID, Januvia 100 MG daily ,and low dose Amaryl, which he has taken in past -- 0.5 MG and ensure no frequent BS <70, he is aware to notify provider if this presents.  Goal to avoid insulin if possible. - Continue Gabapentin 600 MG BID and 900 MG at night (current CrCl 85) and Duloxetine.  May benefit from pain management referral in future for Capsaicin cream wraps.  Discussed with him - Monitor BS daily in morning at home.  - Eye exam up to date, recommend he obtain.  Foot exam up to date - Consider change from BB to ACE/ARB in future.  Statin on board. - Return in 3 months, sooner if needed dependent on labs.      Relevant Orders   Bayer DCA Hb A1c Waived     Musculoskeletal and Integument   Rheumatoid arthritis of multiple sites with negative rheumatoid factor (HCC)    Chronic, ongoing.  Followed by rheumatology with improved functional status.  Continue this collaboration and current regimen as prescribed by them.  Recent note reviewed.  Discussed that he can take occasional Tylenol as needed for back  pain, max dosing 3000 MG daily.        Other   Personal history of renal cancer    Followed by urology, continue this collaboration.  Recent notes reviewed.      Other Visit Diagnoses     Flu vaccine need       Flu vaccine today, educated patient on this.   Relevant Orders   Flu Vaccine Trivalent High Dose (Fluad) (Completed)        Follow up plan: Return in about 3 months (around 06/24/2023) for Annual Physical -- after 06/13/23.

## 2023-03-24 NOTE — Assessment & Plan Note (Signed)
Chronic, ongoing in patient with history of renal cell CA.  A1c trend down today to 6.3%, remains at goal.  Did not tolerate Trulicity, GI issues.  Marcelline Deist tried with GI issues presenting.  - Continue Metformin 1000 MG BID, Januvia 100 MG daily ,and low dose Amaryl, which he has taken in past -- 0.5 MG and ensure no frequent BS <70, he is aware to notify provider if this presents.  Goal to avoid insulin if possible. - Monitor BS daily in morning at home.  - Eye exam up to date.  Foot exam up to date - Consider change from BB to ACE/ARB in future.  Statin on board. - Return in 3 months, sooner if needed dependent on labs.

## 2023-03-24 NOTE — Assessment & Plan Note (Signed)
Chronic, stable, noted on exams.  Recommend continue to perform gentle skin care and monitor for skin breakdown, if present notify provider.

## 2023-03-24 NOTE — Assessment & Plan Note (Signed)
Chronic, stable.  BP at goal for age in office today on recheck, avoid hypotension due to age.  Recommend he monitor BP at home at least three mornings a week + focus on DASH diet.  Continue current medication regimen and adjust as needed.  Labs: A1c.  Urine ALB 10 June 2022.  May benefit addition of ACE or ARB in future and transition off BB.

## 2023-03-24 NOTE — Assessment & Plan Note (Signed)
Followed by urology, continue this collaboration.  Recent notes reviewed.

## 2023-03-24 NOTE — Assessment & Plan Note (Signed)
Chronic, ongoing.  Followed by rheumatology with improved functional status.  Continue this collaboration and current regimen as prescribed by them.  Recent note reviewed.  Discussed that he can take occasional Tylenol as needed for back pain, max dosing 3000 MG daily.

## 2023-03-24 NOTE — Assessment & Plan Note (Signed)
To bilateral feet. A1c trend down today to 6.3%, remains at goal.  Did not tolerate Trulicity, GI issues.  Marcelline Deist tried with GI issues presenting.  - Continue Metformin 1000 MG BID, Januvia 100 MG daily ,and low dose Amaryl, which he has taken in past -- 0.5 MG and ensure no frequent BS <70, he is aware to notify provider if this presents.  Goal to avoid insulin if possible. - Continue Gabapentin 600 MG BID and 900 MG at night (current CrCl 85) and Duloxetine.  May benefit from pain management referral in future for Capsaicin cream wraps.  Discussed with him - Monitor BS daily in morning at home.  - Eye exam up to date, recommend he obtain.  Foot exam up to date - Consider change from BB to ACE/ARB in future.  Statin on board. - Return in 3 months, sooner if needed dependent on labs.

## 2023-04-09 ENCOUNTER — Other Ambulatory Visit: Payer: Self-pay | Admitting: Nurse Practitioner

## 2023-04-09 DIAGNOSIS — E1159 Type 2 diabetes mellitus with other circulatory complications: Secondary | ICD-10-CM

## 2023-04-11 NOTE — Telephone Encounter (Signed)
Rxs were sent to pharmacy on 01/08/23 #90/1  Requested Prescriptions  Pending Prescriptions Disp Refills   atorvastatin (LIPITOR) 20 MG tablet [Pharmacy Med Name: Atorvastatin Calcium 20 MG Oral Tablet] 100 tablet 2    Sig: TAKE 1 TABLET BY MOUTH DAILY AT  6 PM.     Cardiovascular:  Antilipid - Statins Failed - 04/09/2023 11:04 PM      Failed - Lipid Panel in normal range within the last 12 months    Cholesterol, Total  Date Value Ref Range Status  12/20/2022 100 100 - 199 mg/dL Final   Cholesterol Piccolo, Waived  Date Value Ref Range Status  10/13/2018 101 <200 mg/dL Final    Comment:                            Desirable                <200                         Borderline High      200- 239                         High                     >239    LDL Chol Calc (NIH)  Date Value Ref Range Status  12/20/2022 42 0 - 99 mg/dL Final   HDL  Date Value Ref Range Status  12/20/2022 34 (L) >39 mg/dL Final   Triglycerides  Date Value Ref Range Status  12/20/2022 135 0 - 149 mg/dL Final   Triglycerides Piccolo,Waived  Date Value Ref Range Status  10/13/2018 91 <150 mg/dL Final    Comment:                            Normal                   <150                         Borderline High     150 - 199                         High                200 - 499                         Very High                >499          Passed - Patient is not pregnant      Passed - Valid encounter within last 12 months    Recent Outpatient Visits           2 weeks ago Diabetes mellitus due to underlying condition with diabetic nephropathy, without long-term current use of insulin (HCC)   [place] [facility] [place], [place] T, NP   3 months ago Diabetes mellitus due to underlying condition with diabetic nephropathy, without long-term current use of insulin (HCC)   [place] [facility] [place], [place] T, NP   6 months ago Diabetes mellitus due to underlying  condition  with diabetic nephropathy, without long-term current use of insulin (HCC)   [place] [facility] [place], [place] T, NP   10 months ago Diabetes mellitus due to underlying condition with diabetic nephropathy, without long-term current use of insulin (HCC)   [place] [facility] [place], [place] T, NP   10 months ago Subacute cough   [place] [facility] [place], Corrie Dandy T, NP       Future Appointments             In 2 months [name], Dorie Rank, NP [facility]   In 5 months [name], MD [facility] [place]             tamsulosin (FLOMAX) 0.4 MG CAPS capsule [Pharmacy Med Name: Tamsulosin HCl 0.4 MG Oral Capsule] 100 capsule 2    Sig: TAKE 1 CAPSULE BY MOUTH DAILY     Urology: Alpha-Adrenergic Blocker Failed - 04/09/2023 11:04 PM      Failed - PSA in normal range and within 360 days    Prostate Specific Ag, Serum  Date Value Ref Range Status  09/16/2022 4.1 (H) 0.0 - 4.0 ng/mL Final    Comment:    Roche ECLIA methodology. According to the American Urological Association, Serum PSA should decrease and remain at undetectable levels after radical prostatectomy. The AUA defines biochemical recurrence as an initial PSA value 0.2 ng/mL or greater followed by a subsequent confirmatory PSA value 0.2 ng/mL or greater. Values obtained with different assay methods or kits cannot be used interchangeably. Results cannot be interpreted as absolute evidence of the presence or absence of malignant disease.          Passed - Last BP in normal range    BP Readings from Last 1 Encounters:  03/24/23 128/72         Passed - Valid encounter within last 12 months    Recent Outpatient Visits           2 weeks ago Diabetes mellitus due to underlying condition with diabetic nephropathy, without long-term current use of insulin (HCC)   [place] [facility] [place], [place] T, NP    3 months ago Diabetes mellitus due to underlying condition with diabetic nephropathy, without long-term current use of insulin (HCC)   [place] [facility] [place], [place] T, NP   6 months ago Diabetes mellitus due to underlying condition with diabetic nephropathy, without long-term current use of insulin (HCC)   [place] [facility] [place], [place] T, NP   10 months ago Diabetes mellitus due to underlying condition with diabetic nephropathy, without long-term current use of insulin (HCC)   [place] [facility] [place], Corrie Dandy T, NP   10 months ago Subacute cough   [place] [facility] [place], Dorie Rank, NP       Future Appointments             In 2 months [name], Dorie Rank, NP  [facility], [facility]   In 5 months [name], MD [facility] [place]             JANUVIA 100 MG tablet [Pharmacy Med Name: Januvia 100 MG Oral Tablet] 100 tablet 2    Sig: TAKE 1 TABLET BY MOUTH DAILY     Endocrinology:  Diabetes - DPP-4 Inhibitors Passed - 04/09/2023 11:04 PM      Passed - HBA1C is between 0 and 7.9  and within 180 days    Hemoglobin A1C  Date Value Ref Range Status  12/27/2015 6.3  Final   HB A1C (BAYER DCA - WAIVED)  Date Value Ref Range Status  03/24/2023 6.3 (H) 4.8 - 5.6 % Final    Comment:             Prediabetes: 5.7 - 6.4          Diabetes: >6.4          Glycemic control for adults with diabetes: <7.0          Passed - Cr in normal range and within 360 days    Creatinine, Ser  Date Value Ref Range Status  12/20/2022 0.83 0.76 - 1.27 mg/dL Final         Passed - Valid encounter within last 6 months    Recent Outpatient Visits           2 weeks ago Diabetes mellitus due to underlying condition with diabetic nephropathy, without long-term current use of insulin (HCC)   [place] [facility] [place], [place] T, NP   3 months ago Diabetes mellitus due to  underlying condition with diabetic nephropathy, without long-term current use of insulin (HCC)   [place] [facility] [place], [place] T, NP   6 months ago Diabetes mellitus due to underlying condition with diabetic nephropathy, without long-term current use of insulin (HCC)   [place] [facility] [place], [place] T, NP   10 months ago Diabetes mellitus due to underlying condition with diabetic nephropathy, without long-term current use of insulin (HCC)   [place] [facility] [place], [place] T, NP   10 months ago Subacute cough   [place] [facility] [place], [place] T, NP       Future Appointments             In 2 months [place], [place] T, NP [place] [facility], [facility]   In 5 months [name], MD [facility] [place]             carvedilol (COREG) 6.25 MG tablet [Pharmacy Med Name: Carvedilol 6.25 MG Oral Tablet] 200 tablet 2    Sig: TAKE 1 TABLET BY MOUTH TWICE  DAILY     Cardiovascular: Beta Blockers 3 Passed - 04/09/2023 11:04 PM      Passed - Cr in normal range and within 360 days    Creatinine, Ser  Date Value Ref Range Status  12/20/2022 0.83 0.76 - 1.27 mg/dL Final         Passed - AST in normal range and within 360 days    AST  Date Value Ref Range Status  12/20/2022 20 0 - 40 IU/L Final   AST (SGOT) Piccolo, Waived  Date Value Ref Range Status  10/13/2018 16 11 - 38 U/L Final         Passed - ALT in normal range and within 360 days    ALT  Date Value Ref Range Status  12/20/2022 16 0 - 44 IU/L Final   ALT (SGPT) Piccolo, Waived  Date Value Ref Range Status  10/13/2018 19 10 - 47 U/L Final         Passed - Last BP in normal range    BP Readings from Last 1 Encounters:  03/24/23 128/72         Passed - Last Heart Rate in normal range    Pulse Readings from Last 1  Encounters:  03/24/23 93         Passed - Valid encounter within last 6 months    Recent  Outpatient Visits           2 weeks ago Diabetes mellitus due to underlying condition with diabetic nephropathy, without long-term current use of insulin (HCC)   [place] [facility] [place], [place] T, NP   3 months ago Diabetes mellitus due to underlying condition with diabetic nephropathy, without long-term current use of insulin (HCC)   [place] [facility] [place], [place] T, NP   6 months ago Diabetes mellitus due to underlying condition with diabetic nephropathy, without long-term current use of insulin (HCC)   [place] [facility] [place], [place] T, NP   10 months ago Diabetes mellitus due to underlying condition with diabetic nephropathy, without long-term current use of insulin (HCC)   [place] [facility] [place], Corrie Dandy T, NP   10 months ago Subacute cough   [place] [facility] [place], Dorie Rank, NP       Future Appointments             In 2 months [name], Dorie Rank, NP [place] [facility], [facility]   In 5 months [name], MD [facility]

## 2023-05-29 DIAGNOSIS — M0609 Rheumatoid arthritis without rheumatoid factor, multiple sites: Secondary | ICD-10-CM | POA: Diagnosis not present

## 2023-05-29 DIAGNOSIS — Z79899 Other long term (current) drug therapy: Secondary | ICD-10-CM | POA: Diagnosis not present

## 2023-05-29 DIAGNOSIS — M150 Primary generalized (osteo)arthritis: Secondary | ICD-10-CM | POA: Diagnosis not present

## 2023-06-22 DIAGNOSIS — E119 Type 2 diabetes mellitus without complications: Secondary | ICD-10-CM | POA: Insufficient documentation

## 2023-06-24 ENCOUNTER — Ambulatory Visit (INDEPENDENT_AMBULATORY_CARE_PROVIDER_SITE_OTHER): Payer: Medicare Other | Admitting: Nurse Practitioner

## 2023-06-24 VITALS — BP 136/70 | HR 65 | Temp 97.5°F | Ht 68.4 in | Wt 205.4 lb

## 2023-06-24 DIAGNOSIS — E1169 Type 2 diabetes mellitus with other specified complication: Secondary | ICD-10-CM | POA: Diagnosis not present

## 2023-06-24 DIAGNOSIS — D692 Other nonthrombocytopenic purpura: Secondary | ICD-10-CM | POA: Diagnosis not present

## 2023-06-24 DIAGNOSIS — E1159 Type 2 diabetes mellitus with other circulatory complications: Secondary | ICD-10-CM | POA: Diagnosis not present

## 2023-06-24 DIAGNOSIS — E785 Hyperlipidemia, unspecified: Secondary | ICD-10-CM | POA: Diagnosis not present

## 2023-06-24 DIAGNOSIS — E1140 Type 2 diabetes mellitus with diabetic neuropathy, unspecified: Secondary | ICD-10-CM | POA: Diagnosis not present

## 2023-06-24 DIAGNOSIS — E119 Type 2 diabetes mellitus without complications: Secondary | ICD-10-CM | POA: Diagnosis not present

## 2023-06-24 DIAGNOSIS — E1141 Type 2 diabetes mellitus with diabetic mononeuropathy: Secondary | ICD-10-CM

## 2023-06-24 DIAGNOSIS — Z7984 Long term (current) use of oral hypoglycemic drugs: Secondary | ICD-10-CM

## 2023-06-24 DIAGNOSIS — E0821 Diabetes mellitus due to underlying condition with diabetic nephropathy: Secondary | ICD-10-CM

## 2023-06-24 DIAGNOSIS — M0609 Rheumatoid arthritis without rheumatoid factor, multiple sites: Secondary | ICD-10-CM

## 2023-06-24 DIAGNOSIS — R972 Elevated prostate specific antigen [PSA]: Secondary | ICD-10-CM

## 2023-06-24 DIAGNOSIS — E559 Vitamin D deficiency, unspecified: Secondary | ICD-10-CM

## 2023-06-24 DIAGNOSIS — Z85528 Personal history of other malignant neoplasm of kidney: Secondary | ICD-10-CM | POA: Diagnosis not present

## 2023-06-24 DIAGNOSIS — E538 Deficiency of other specified B group vitamins: Secondary | ICD-10-CM

## 2023-06-24 DIAGNOSIS — I700 Atherosclerosis of aorta: Secondary | ICD-10-CM

## 2023-06-24 DIAGNOSIS — Z0000 Encounter for general adult medical examination without abnormal findings: Secondary | ICD-10-CM

## 2023-06-24 DIAGNOSIS — I152 Hypertension secondary to endocrine disorders: Secondary | ICD-10-CM | POA: Diagnosis not present

## 2023-06-24 LAB — BAYER DCA HB A1C WAIVED: HB A1C (BAYER DCA - WAIVED): 6.5 % — ABNORMAL HIGH (ref 4.8–5.6)

## 2023-06-24 LAB — MICROALBUMIN, URINE WAIVED
Creatinine, Urine Waived: 50 mg/dL (ref 10–300)
Microalb, Ur Waived: 10 mg/L (ref 0–19)

## 2023-06-24 MED ORDER — GLIMEPIRIDE 1 MG PO TABS: 0.5000 mg | ORAL_TABLET | Freq: Every day | ORAL | 3 refills | Status: DC

## 2023-06-24 MED ORDER — TAMSULOSIN HCL 0.4 MG PO CAPS: 0.4000 mg | ORAL_CAPSULE | Freq: Every day | ORAL | 3 refills | Status: DC

## 2023-06-24 MED ORDER — ATORVASTATIN CALCIUM 20 MG PO TABS: ORAL_TABLET | ORAL | 3 refills | Status: DC

## 2023-06-24 MED ORDER — CARVEDILOL 6.25 MG PO TABS: 6.2500 mg | ORAL_TABLET | Freq: Two times a day (BID) | ORAL | 3 refills | Status: DC

## 2023-06-24 MED ORDER — METFORMIN HCL 500 MG PO TABS: ORAL_TABLET | ORAL | 4 refills | Status: DC

## 2023-06-24 MED ORDER — DULOXETINE HCL 60 MG PO CPEP: 60.0000 mg | ORAL_CAPSULE | Freq: Every day | ORAL | 4 refills | Status: DC

## 2023-06-24 MED ORDER — GABAPENTIN 600 MG PO TABS: ORAL_TABLET | ORAL | 3 refills | Status: DC

## 2023-06-24 MED ORDER — FINASTERIDE 5 MG PO TABS
5.0000 mg | ORAL_TABLET | Freq: Every day | ORAL | 3 refills | Status: DC
Start: 2023-06-24 — End: 2023-09-17

## 2023-06-24 MED ORDER — SITAGLIPTIN PHOSPHATE 100 MG PO TABS: 100.0000 mg | ORAL_TABLET | Freq: Every day | ORAL | 3 refills | Status: DC

## 2023-06-24 MED ORDER — METHOTREXATE SODIUM 2.5 MG PO TABS: ORAL_TABLET | ORAL | 1 refills | Status: AC

## 2023-06-24 NOTE — Assessment & Plan Note (Signed)
 Chronic, ongoing.  Continue current medication regimen and adjust as needed. Lipid panel today.

## 2023-06-24 NOTE — Assessment & Plan Note (Signed)
Chronic, ongoing.  Continue current medication regimen and collaboration with urology.  Recent note reviewed. 

## 2023-06-24 NOTE — Assessment & Plan Note (Signed)
Ongoing.  Continue daily supplement and recheck level today, recent was stable. 

## 2023-06-24 NOTE — Assessment & Plan Note (Signed)
Ongoing and improving with supplement.  Recheck today and consider reducing or discontinuing supplement as needed. 

## 2023-06-24 NOTE — Assessment & Plan Note (Signed)
Continue collaboration with urology.  Recent note reviewed.  PSA monitored by them.

## 2023-06-24 NOTE — Assessment & Plan Note (Signed)
Chronic, ongoing in patient with history of renal cell CA.  A1c today 6.5%, remains at goal.  Did not tolerate Trulicity, GI issues.  Marcelline Deist tried with GI issues presenting.  - Continue Metformin 1000 MG BID, Januvia 100 MG daily ,and low dose Amaryl, which he has taken in past -- 0.5 MG and ensure no frequent BS <70, he is aware to notify provider if this presents.  Goal to avoid insulin if possible. - Monitor BS daily in morning at home.  - Eye exam up to date.  Foot exam up to date - Consider change from BB to ACE/ARB in future.  Statin on board. - Return in 3 months, sooner if needed dependent on labs.

## 2023-06-24 NOTE — Assessment & Plan Note (Signed)
Ongoing.  Noted on CXR 07/20/19.  Continue statin and ASA daily for prevention.  Monitor closely.  Educated patient on this. 

## 2023-06-24 NOTE — Assessment & Plan Note (Signed)
 Refer to diabetes with neuropathy plan of care

## 2023-06-24 NOTE — Progress Notes (Signed)
BP 136/70 (BP Location: Left Arm, Patient Position: Sitting)   Pulse 65   Temp (!) 97.5 F (36.4 C) (Oral)   Ht 5' 8.4" (1.737 m)   Wt 205 lb 6.4 oz (93.2 kg)   SpO2 98%   BMI 30.87 kg/m    Subjective:    Patient ID: Jason Contreras, male    DOB: Feb 17, 1941, 83 y.o.   MRN: [id number]  HPI: Jason Contreras is a 83 y.o. male presenting on 06/24/2023 for comprehensive medical examination. Current medical complaints include:none  He currently lives with: wife Interim Problems from his last visit: no   DIABETES A1c November was 6.3%.  Taking Metformin 1000 MG BID, Glimepiride 0.5 MG daily, and Januvia 100 MG daily. Takes Gabapentin 600 MG TID and B12 for neuropathy + Vitamin D. Hypoglycemic episodes:no Polydipsia/polyuria: no Visual disturbance: no Chest pain: no Paresthesias: no Glucose Monitoring: no  Accucheck frequency: three times a week  Fasting glucose: 90 this morning  Post prandial:  Evening:  Before meals: Taking Insulin?: no  Long acting insulin:  Short acting insulin: Blood Pressure Monitoring: a few times a week Retinal Examination: Not up to Date Foot Exam: Up to Date Pneumovax: Not up to Date Influenza: Up to Date Aspirin: no   HYPERTENSION / HYPERLIPIDEMIA Taking Atorvastatin 20 MG and Coreg 6.25 MG BID.  Aortic atherosclerosis noted on CXR 07/20/19. Satisfied with current treatment? yes Duration of hypertension: chronic BP monitoring frequency: a few times a week BP range:  BP medication side effects: no Duration of hyperlipidemia: chronic Cholesterol medication side effects: no Cholesterol supplements: none Medication compliance: good compliance Aspirin: no Recent stressors: no Recurrent headaches: no Visual changes: no Palpitations: no Dyspnea: no Chest pain: no Lower extremity edema: no Dizzy/lightheaded: no   RHEUMATOID ARTHRITIS Follows with rheumatology for RA of multiple sites with negative Rf.  Last visit 05/28/22.  Overall pain has  been much improved with Methotrexate. Pain control status: improved Duration: chronic Location: multiple areas What Activities task can be accomplished with current medication? ADL's daily Previous pain specialty evaluation: yes Non-narcotic analgesic meds: yes   ELEVATED PSA: Follows with urology, last visit 09/17/22. They monitor PSA levels. Had negative biopsy.  Has history of kidney cancer to right side, had two operations on this.  Continues on Tamsulosin and Finasteride.   Functional Status Survey: Is the patient deaf or have difficulty hearing?: No Does the patient have difficulty seeing, even when wearing glasses/contacts?: No Does the patient have difficulty concentrating, remembering, or making decisions?: No Does the patient have difficulty walking or climbing stairs?: No Does the patient have difficulty dressing or bathing?: No Does the patient have difficulty doing errands alone such as visiting a doctor's office or shopping?: No  FALL RISK:    03/24/2023    2:39 PM 03/04/2023    1:51 PM 12/20/2022    1:27 PM 09/18/2022    2:19 PM 06/12/2022    2:44 PM  Fall Risk   Falls in the past year? 0 0 0 0 0  Number falls in past yr: 0 0 0 0 0  Injury with Fall? 0 0 0 0 0  Risk for fall due to : No Fall Risks No Fall Risks No Fall Risks No Fall Risks No Fall Risks  Follow up Falls evaluation completed Falls prevention discussed Falls evaluation completed Falls evaluation completed Falls prevention discussed   Depression Screen    03/24/2023    2:40 PM 03/04/2023    1:48 PM  12/20/2022    1:27 PM 09/18/2022    2:19 PM 06/12/2022    2:33 PM  Depression screen PHQ 2/9  Decreased Interest 0 0 0 0 0  Down, Depressed, Hopeless 0 0 0 0 0  PHQ - 2 Score 0 0 0 0 0  Altered sleeping 0 0 0 0 0  Tired, decreased energy 0 0 0 0 0  Change in appetite 0 0 0 0 0  Feeling bad or failure about yourself  0 0 0 0 0  Trouble concentrating 0 0 0 0 0  Moving slowly or fidgety/restless 0 0 0 0 0   Suicidal thoughts 0 0 0 0 0  PHQ-9 Score 0 0 0 0 0  Difficult doing work/chores Not difficult at all Not difficult at all Not difficult at all Not difficult at all Not difficult at all      03/24/2023    2:40 PM 12/20/2022    1:28 PM 09/18/2022    2:19 PM 06/12/2022    2:34 PM  GAD 7 : Generalized Anxiety Score  Nervous, Anxious, on Edge 0 0 0 0  Control/stop worrying 0 0 0 0  Worry too much - different things 0 0 0 0  Trouble relaxing 0 0 0 0  Restless 0 0 0 0  Easily annoyed or irritable 0 0 0 0  Afraid - awful might happen 0 0 0 0  Total GAD 7 Score 0 0 0 0  Anxiety Difficulty Not difficult at all Not difficult at all Not difficult at all Not difficult at all   Past Medical History:  Past Medical History:  Diagnosis Date   Adenocarcinoma, renal cell (HCC)    Arthritis    Collagen vascular disease (HCC)    Diabetes mellitus without complication (HCC)    Elevated PSA    Headache    Hematuria    Hyperlipidemia    Hypertension    Neuropathy    Obesity    PONV (postoperative nausea and vomiting)    Renal insufficiency    Right renal mass     Surgical History:  Past Surgical History:  Procedure Laterality Date   APPENDECTOMY     CRYOABLATION  10/17/2017   IR RADIOLOGIST EVAL & MGMT  07/17/2017   IR RADIOLOGIST EVAL & MGMT  08/13/2017   IR RADIOLOGIST EVAL & MGMT  11/12/2017   IR RADIOLOGIST EVAL & MGMT  03/05/2018   RADIOLOGY WITH ANESTHESIA Right 10/17/2017   Procedure: CT WITH ANESTHESIA RENAL CRYOABLATION;  Surgeon: Berdine Dance, MD;  Location: [facility];  Service: Anesthesiology;  Laterality: Right;   ROBOTIC ASSITED PARTIAL NEPHRECTOMY Right 12/12/2015   Procedure: ROBOTIC ASSITED PARTIAL NEPHRECTOMY;  Surgeon: [name], MD;  Location: [facility];  Service: Urology;  Laterality: Right;    Medications:  Current Outpatient Medications on File Prior to Visit  Medication Sig   acetaminophen (TYLENOL) 325 MG tablet Take 650 mg by mouth every 6 (six) hours as needed.  Takes at bedtime and occasionally in am for back/hip pain   B Complex-C-Folic Acid (SUPER B COMPLEX/FA/VIT C PO) Take 1 tablet by mouth daily. Contains 400 mcg folic acid   cholecalciferol (VITAMIN D3) 25 MCG (1000 UNIT) tablet Take 2,000 Units by mouth daily.    folic acid (FOLVITE) 1 MG tablet Take 1 tablet (1 mg total) by mouth daily.   lidocaine (LMX) 4 % cream Apply 1 application topically as needed.   mometasone (ELOCON) 0.1 % cream APPLY  CREAM TOPICALLY  ONCE DAILY   vitamin C (ASCORBIC ACID) 500 MG tablet Take 500 mg by mouth daily.   No current facility-administered medications on file prior to visit.    Allergies:  Allergies  Allergen Reactions   Dapagliflozin Nausea And Vomiting    Made him really sick   Morphine Nausea And Vomiting   Trulicity [Dulaglutide] Nausea And Vomiting    Social History:  Social History   Socioeconomic History   Marital status: Married    Spouse name: Stage manager   Number of children: 2   Years of education: 3 years college    Highest education level: Associate degree: occupational, Scientist, product/process development, or vocational program  Occupational History   Not on file  Tobacco Use   Smoking status: Never   Smokeless tobacco: Never  Vaping Use   Vaping status: Never Used  Substance and Sexual Activity   Alcohol use: No   Drug use: No   Sexual activity: Not Currently  Other Topics Concern   Not on file  Social History Narrative   Working part time [employer] store   Social Drivers of Health   Financial Resource Strain: Low Risk  (06/24/2023)   Overall Financial Resource Strain (CARDIA)    Difficulty of Paying Living Expenses: Not hard at all  Food Insecurity: No Food Insecurity (06/24/2023)   Hunger Vital Sign    Worried About Running Out of Food in the Last Year: Never true    Ran Out of Food in the Last Year: Never true  Transportation Needs: No Transportation Needs (06/24/2023)   PRAPARE - Administrator, Civil Service (Medical): No    Lack of  Transportation (Non-Medical): No  Physical Activity: Insufficiently Active (06/24/2023)   Exercise Vital Sign    Days of Exercise per Week: 2 days    Minutes of Exercise per Session: 20 min  Stress: No Stress Concern Present (06/24/2023)   [employer] of Occupational Health - Occupational Stress Questionnaire    Feeling of Stress : Not at all  Social Connections: Moderately Integrated (06/24/2023)   Social Connection and Isolation Panel [NHANES]    Frequency of Communication with Friends and Family: More than three times a week    Frequency of Social Gatherings with Friends and Family: Once a week    Attends Religious Services: More than 4 times per year    Active Member of [employer] or Organizations: No    Attends Banker Meetings: Never    Marital Status: Married  Catering manager Violence: Not At Risk (03/04/2023)   Humiliation, Afraid, Rape, and Kick questionnaire    Fear of Current or Ex-Partner: No    Emotionally Abused: No    Physically Abused: No    Sexually Abused: No   Social History   Tobacco Use  Smoking Status Never  Smokeless Tobacco Never   Social History   Substance and Sexual Activity  Alcohol Use No    Family History:  Family History  Problem Relation Age of Onset   Bone cancer Mother    Cancer Mother    Cancer Father    Stroke Brother    Kidney disease Neg Hx     Past medical history, surgical history, medications, allergies, family history and social history reviewed with patient today and changes made to appropriate areas of the chart.   Review of Systems - negative All other ROS negative except what is listed above and in the HPI.      Objective:    BP  136/70 (BP Location: Left Arm, Patient Position: Sitting)   Pulse 65   Temp (!) 97.5 F (36.4 C) (Oral)   Ht 5' 8.4" (1.737 m)   Wt 205 lb 6.4 oz (93.2 kg)   SpO2 98%   BMI 30.87 kg/m   Wt Readings from Last 3 Encounters:  06/24/23 205 lb 6.4 oz (93.2 kg)  03/24/23 205 lb  (93 kg)  03/04/23 202 lb (91.6 kg)    Physical Exam Vitals and nursing note reviewed.  Constitutional:      General: He is awake. He is not in acute distress.    Appearance: He is well-developed and overweight. He is not ill-appearing.  HENT:     Head: Normocephalic and atraumatic.     Right Ear: Hearing, tympanic membrane, ear canal and external ear normal. No drainage.     Left Ear: Hearing, tympanic membrane, ear canal and external ear normal. No drainage.     Nose: Nose normal.     Mouth/Throat:     Pharynx: Uvula midline.  Eyes:     General: Lids are normal.        Right eye: No discharge.        Left eye: No discharge.     Extraocular Movements: Extraocular movements intact.     Conjunctiva/sclera: Conjunctivae normal.     Pupils: Pupils are equal, round, and reactive to light.     Visual Fields: Right eye visual fields normal and left eye visual fields normal.  Neck:     Thyroid: No thyromegaly.     Vascular: No carotid bruit or JVD.     Trachea: Trachea normal.  Cardiovascular:     Rate and Rhythm: Normal rate and regular rhythm.     Heart sounds: Normal heart sounds, S1 normal and S2 normal. No murmur heard.    No gallop.  Pulmonary:     Effort: Pulmonary effort is normal. No accessory muscle usage or respiratory distress.     Breath sounds: Normal breath sounds.  Abdominal:     General: Bowel sounds are normal.     Palpations: Abdomen is soft. There is no hepatomegaly or splenomegaly.     Tenderness: There is no abdominal tenderness.  Genitourinary:    Comments: Deferred per patient request. Musculoskeletal:        General: Normal range of motion.     Cervical back: Normal range of motion and neck supple.     Right lower leg: No edema.     Left lower leg: No edema.  Lymphadenopathy:     Head:     Right side of head: No submental, submandibular, tonsillar, preauricular or posterior auricular adenopathy.     Left side of head: No submental, submandibular,  tonsillar, preauricular or posterior auricular adenopathy.     Cervical: No cervical adenopathy.  Skin:    General: Skin is warm and dry.     Capillary Refill: Capillary refill takes less than 2 seconds.     Findings: No rash.     Comments: A few scattered pale purple bruises to bilateral upper extremity.  Neurological:     Mental Status: He is alert and oriented to person, place, and time.     Gait: Gait is intact.     Deep Tendon Reflexes: Reflexes are normal and symmetric.     Reflex Scores:      Brachioradialis reflexes are 2+ on the right side and 2+ on the left side.      Patellar reflexes  are 2+ on the right side and 2+ on the left side. Psychiatric:        Attention and Perception: Attention normal.        Mood and Affect: Mood normal.        Speech: Speech normal.        Behavior: Behavior normal. Behavior is cooperative.        Thought Content: Thought content normal.        Cognition and Memory: Cognition normal.        Judgment: Judgment normal.     Diabetic Foot Exam - Simple   Simple Foot Form Visual Inspection See comments: Yes Sensation Testing See comments: Yes Pulse Check Posterior Tibialis and Dorsalis pulse intact bilaterally: Yes Comments Dry skin and thickened toenails.  Sensation left 7/10 and right 9/10.    Results for orders placed or performed in visit on 06/24/23  Bayer DCA Hb A1c Waived   Collection Time: 06/24/23  2:11 PM  Result Value Ref Range   HB A1C (BAYER DCA - WAIVED) 6.5 (H) 4.8 - 5.6 %  Microalbumin, Urine Waived   Collection Time: 06/24/23  2:11 PM  Result Value Ref Range   Microalb, Ur Waived 10 0 - 19 mg/L   Creatinine, Urine Waived 50 10 - 300 mg/dL   Microalb/Creat Ratio 30-300 (H) <30 mg/g      Assessment & Plan:   Problem List Items Addressed This Visit       Cardiovascular and Mediastinum   Aortic atherosclerosis (HCC)   Ongoing.  Noted on CXR 07/20/19.  Continue statin and ASA daily for prevention.  Monitor  closely.  Educated patient on this.      Relevant Medications   atorvastatin (LIPITOR) 20 MG tablet   carvedilol (COREG) 6.25 MG tablet   Other Relevant Orders   Comprehensive metabolic panel   Lipid Panel w/o Chol/HDL Ratio   Hypertension associated with diabetes (HCC)   Chronic, stable.  BP at goal for age in office today on recheck, avoid hypotension due to age.  Recommend he monitor BP at home at least three mornings a week + focus on DASH diet.  Continue current medication regimen and adjust as needed.  Labs: A1c, CMP, TSH, CBC.  Urine ALB 16 June 2023.  May benefit addition of ACE or ARB in future and transition off BB.      Relevant Medications   atorvastatin (LIPITOR) 20 MG tablet   carvedilol (COREG) 6.25 MG tablet   glimepiride (AMARYL) 1 MG tablet   metFORMIN (GLUCOPHAGE) 500 MG tablet   sitaGLIPtin (JANUVIA) 100 MG tablet   Other Relevant Orders   Bayer DCA Hb A1c Waived (Completed)   CBC with Differential/Platelet   Comprehensive metabolic panel   TSH   Senile purpura (HCC)   Chronic, stable, noted on exams.  Recommend continue to perform gentle skin care and monitor for skin breakdown, if present notify provider.      Relevant Medications   atorvastatin (LIPITOR) 20 MG tablet   carvedilol (COREG) 6.25 MG tablet   Other Relevant Orders   CBC with Differential/Platelet     Endocrine   Diabetes mellitus treated with oral medication (HCC)   Refer to diabetes with neuropathy plan of care.      Relevant Medications   atorvastatin (LIPITOR) 20 MG tablet   glimepiride (AMARYL) 1 MG tablet   metFORMIN (GLUCOPHAGE) 500 MG tablet   sitaGLIPtin (JANUVIA) 100 MG tablet   Hyperlipidemia associated with type 2 diabetes  mellitus (HCC)   Chronic, ongoing.  Continue current medication regimen and adjust as needed.  Lipid panel today.      Relevant Medications   atorvastatin (LIPITOR) 20 MG tablet   carvedilol (COREG) 6.25 MG tablet   glimepiride (AMARYL) 1 MG tablet    metFORMIN (GLUCOPHAGE) 500 MG tablet   sitaGLIPtin (JANUVIA) 100 MG tablet   Other Relevant Orders   Bayer DCA Hb A1c Waived (Completed)   Comprehensive metabolic panel   Lipid Panel w/o Chol/HDL Ratio   Type 2 diabetes, controlled, with neuropathy (HCC) - Primary   Chronic, ongoing in patient with history of renal cell CA.  A1c today 6.5%, remains at goal.  Did not tolerate Trulicity, GI issues.  Marcelline Deist tried with GI issues presenting.  - Continue Metformin 1000 MG BID, Januvia 100 MG daily ,and low dose Amaryl, which he has taken in past -- 0.5 MG and ensure no frequent BS <70, he is aware to notify provider if this presents.  Goal to avoid insulin if possible. - Monitor BS daily in morning at home.  - Eye exam up to date.  Foot exam up to date - Consider change from BB to ACE/ARB in future.  Statin on board. - Return in 3 months, sooner if needed dependent on labs.      Relevant Medications   atorvastatin (LIPITOR) 20 MG tablet   glimepiride (AMARYL) 1 MG tablet   metFORMIN (GLUCOPHAGE) 500 MG tablet   sitaGLIPtin (JANUVIA) 100 MG tablet   Other Relevant Orders   Bayer DCA Hb A1c Waived (Completed)   Microalbumin, Urine Waived (Completed)     Musculoskeletal and Integument   Rheumatoid arthritis of multiple sites with negative rheumatoid factor (HCC)   Chronic, ongoing.  Followed by rheumatology with improved functional status.  Continue this collaboration and current regimen as prescribed by them.  Recent note reviewed.  Discussed that he can take occasional Tylenol as needed for back pain, max dosing 3000 MG daily.      Relevant Medications   methotrexate (RHEUMATREX) 2.5 MG tablet   Other Relevant Orders   Comprehensive metabolic panel     Other   B12 deficiency   Ongoing and improving with supplement.  Recheck today and consider reducing or discontinuing supplement as needed.      Relevant Orders   Vitamin B12   Elevated PSA   Continue collaboration with urology.   Recent note reviewed.  PSA monitored by them.        Personal history of renal cancer   Followed by urology, continue this collaboration.  Recent notes reviewed.      Vitamin D deficiency   Ongoing.  Continue daily supplement and recheck level today, recent was stable.      Relevant Orders   VITAMIN D 25 Hydroxy (Vit-D Deficiency, Fractures)   Other Visit Diagnoses       Encounter for annual physical exam       Annual physical today with labs and health maintenance reviewed, discussed with patient.        Discussed aspirin prophylaxis for myocardial infarction prevention and decision was  refuses  LABORATORY TESTING:  Health maintenance labs ordered today as discussed above.   The natural history of prostate cancer and ongoing controversy regarding screening and potential treatment outcomes of prostate cancer has been discussed with the patient. The meaning of a false positive PSA and a false negative PSA has been discussed. He indicates understanding of the limitations of this screening test  and wishes  to proceed with screening PSA testing -- followed by urology.   IMMUNIZATIONS:   - Tdap: Tetanus vaccination status reviewed: last tetanus within 10 years - Influenza: Up to date - Pneumovax: Up to date - Prevnar: Refused - Zostavax vaccine: Up To Date  SCREENING: - Colonoscopy: Not applicable  Discussed with patient purpose of the colonoscopy is to detect colon cancer at curable precancerous or early stages   - AAA Screening: Not applicable  -Hearing Test: Not applicable  -Spirometry: Not applicable   PATIENT COUNSELING:    Sexuality: Discussed sexually transmitted diseases, partner selection, use of condoms, avoidance of unintended pregnancy  and contraceptive alternatives.   Advised to avoid cigarette smoking.  I discussed with the patient that most people either abstain from alcohol or drink within safe limits (<=14/week and <=4 drinks/occasion for males,  <=7/weeks and <= 3 drinks/occasion for females) and that the risk for alcohol disorders and other health effects rises proportionally with the number of drinks per week and how often a drinker exceeds daily limits.  Discussed cessation/primary prevention of drug use and availability of treatment for abuse.   Diet: Encouraged to adjust caloric intake to maintain  or achieve ideal body weight, to reduce intake of dietary saturated fat and total fat, to limit sodium intake by avoiding high sodium foods and not adding table salt, and to maintain adequate dietary potassium and calcium preferably from fresh fruits, vegetables, and low-fat dairy products.    Stressed the importance of regular exercise  Injury prevention: Discussed safety belts, safety helmets, smoke detector, smoking near bedding or upholstery.   Dental health: Discussed importance of regular tooth brushing, flossing, and dental visits.   Follow up plan: NEXT PREVENTATIVE PHYSICAL DUE IN 1 YEAR. Return in about 3 months (around 09/21/2023) for T2DM, HTN/HLD, RA.

## 2023-06-24 NOTE — Assessment & Plan Note (Signed)
Followed by urology, continue this collaboration.  Recent notes reviewed.

## 2023-06-24 NOTE — Assessment & Plan Note (Signed)
Chronic, ongoing.  Followed by rheumatology with improved functional status.  Continue this collaboration and current regimen as prescribed by them.  Recent note reviewed.  Discussed that he can take occasional Tylenol as needed for back pain, max dosing 3000 MG daily.

## 2023-06-24 NOTE — Assessment & Plan Note (Signed)
Chronic, stable.  BP at goal for age in office today on recheck, avoid hypotension due to age.  Recommend he monitor BP at home at least three mornings a week + focus on DASH diet.  Continue current medication regimen and adjust as needed.  Labs: A1c, CMP, TSH, CBC.  Urine ALB 16 June 2023.  May benefit addition of ACE or ARB in future and transition off BB.

## 2023-06-24 NOTE — Patient Instructions (Signed)

## 2023-06-24 NOTE — Assessment & Plan Note (Signed)
Chronic, stable, noted on exams.  Recommend continue to perform gentle skin care and monitor for skin breakdown, if present notify provider.

## 2023-06-25 ENCOUNTER — Encounter: Payer: Self-pay | Admitting: Nurse Practitioner

## 2023-06-25 LAB — TSH: TSH: 2.55 u[IU]/mL (ref 0.450–4.500)

## 2023-06-25 LAB — CBC WITH DIFFERENTIAL/PLATELET
Basophils Absolute: 0 10*3/uL (ref 0.0–0.2)
Basos: 1 %
EOS (ABSOLUTE): 0.1 10*3/uL (ref 0.0–0.4)
Eos: 2 %
Hematocrit: 40.8 % (ref 37.5–51.0)
Hemoglobin: 13.8 g/dL (ref 13.0–17.7)
Immature Grans (Abs): 0 10*3/uL (ref 0.0–0.1)
Immature Granulocytes: 0 %
Lymphocytes Absolute: 1.3 10*3/uL (ref 0.7–3.1)
Lymphs: 23 %
MCH: 32.6 pg (ref 26.6–33.0)
MCHC: 33.8 g/dL (ref 31.5–35.7)
MCV: 97 fL (ref 79–97)
Monocytes Absolute: 0.4 10*3/uL (ref 0.1–0.9)
Monocytes: 7 %
Neutrophils Absolute: 3.9 10*3/uL (ref 1.4–7.0)
Neutrophils: 67 %
Platelets: 267 10*3/uL (ref 150–450)
RBC: 4.23 x10E6/uL (ref 4.14–5.80)
RDW: 13.5 % (ref 11.6–15.4)
WBC: 5.8 10*3/uL (ref 3.4–10.8)

## 2023-06-25 LAB — COMPREHENSIVE METABOLIC PANEL
ALT: 17 [IU]/L (ref 0–44)
AST: 15 [IU]/L (ref 0–40)
Albumin: 4.2 g/dL (ref 3.8–4.8)
Alkaline Phosphatase: 73 [IU]/L (ref 44–121)
BUN/Creatinine Ratio: 14 (ref 10–24)
BUN: 11 mg/dL (ref 8–27)
Bilirubin Total: 0.8 mg/dL (ref 0.0–1.2)
CO2: 27 mmol/L (ref 20–29)
Calcium: 9.3 mg/dL (ref 8.6–10.2)
Chloride: 102 mmol/L (ref 96–106)
Creatinine, Ser: 0.76 mg/dL (ref 0.76–1.27)
Globulin, Total: 1.8 g/dL (ref 1.5–4.5)
Glucose: 144 mg/dL — ABNORMAL HIGH (ref 70–99)
Potassium: 4.4 mmol/L (ref 3.5–5.2)
Sodium: 142 mmol/L (ref 134–144)
Total Protein: 6 g/dL (ref 6.0–8.5)
eGFR: 91 mL/min/{1.73_m2} (ref >59)

## 2023-06-25 LAB — LIPID PANEL W/O CHOL/HDL RATIO
Cholesterol, Total: 102 mg/dL (ref 100–199)
HDL: 45 mg/dL (ref >39)
LDL Chol Calc (NIH): 36 mg/dL (ref 0–99)
Triglycerides: 114 mg/dL (ref 0–149)
VLDL Cholesterol Cal: 21 mg/dL (ref 5–40)

## 2023-06-25 LAB — VITAMIN D 25 HYDROXY (VIT D DEFICIENCY, FRACTURES): Vit D, 25-Hydroxy: 33.9 ng/mL (ref 30.0–100.0)

## 2023-06-25 LAB — VITAMIN B12: Vitamin B-12: 610 pg/mL (ref 232–1245)

## 2023-06-25 NOTE — Progress Notes (Signed)
Contacted via MyChart   Good morning Jason Contreras, your labs have returned and overall continue to look great.  Kidney function, creatinine and eGFR, remains normal, as is liver function, AST and ALT. Everything else looks great.  No medication changes needed at this time.  Any questions? Keep being amazing!!  Thank you for allowing me to participate in your care.  I appreciate you. Kindest regards, Meganne Rita

## 2023-09-03 ENCOUNTER — Other Ambulatory Visit: Payer: Self-pay

## 2023-09-03 DIAGNOSIS — R972 Elevated prostate specific antigen [PSA]: Secondary | ICD-10-CM

## 2023-09-04 ENCOUNTER — Ambulatory Visit
Admission: RE | Admit: 2023-09-04 | Discharge: 2023-09-04 | Disposition: A | Discharge status: Home / Self Care | Source: Ambulatory Visit | Attending: Urology | Admitting: Urology

## 2023-09-04 DIAGNOSIS — Z85528 Personal history of other malignant neoplasm of kidney: Secondary | ICD-10-CM | POA: Insufficient documentation

## 2023-09-04 DIAGNOSIS — K5730 Diverticulosis of large intestine without perforation or abscess without bleeding: Secondary | ICD-10-CM | POA: Diagnosis not present

## 2023-09-04 DIAGNOSIS — K8689 Other specified diseases of pancreas: Secondary | ICD-10-CM | POA: Diagnosis not present

## 2023-09-04 DIAGNOSIS — Z905 Acquired absence of kidney: Secondary | ICD-10-CM | POA: Diagnosis not present

## 2023-09-04 LAB — POCT I-STAT, CHEM 8
BUN: 14 mg/dL (ref 8–23)
Calcium, Ion: 1.16 mmol/L (ref 1.15–1.40)
Chloride: 102 mmol/L (ref 98–111)
Creatinine, Ser: 0.9 mg/dL (ref 0.61–1.24)
Glucose, Bld: 181 mg/dL — ABNORMAL HIGH (ref 70–99)
HCT: 41 % (ref 39.0–52.0)
Hemoglobin: 13.9 g/dL (ref 13.0–17.0)
Potassium: 4.4 mmol/L (ref 3.5–5.1)
Sodium: 139 mmol/L (ref 135–145)
TCO2: 27 mmol/L (ref 22–32)

## 2023-09-04 MED ORDER — IOHEXOL 300 MG/ML  SOLN
100.0000 mL | Freq: Once | INTRAMUSCULAR | Status: AC | PRN
  Administered 2023-09-04: 100 mL via INTRAVENOUS

## 2023-09-12 ENCOUNTER — Other Ambulatory Visit: Payer: Self-pay

## 2023-09-12 DIAGNOSIS — R972 Elevated prostate specific antigen [PSA]: Secondary | ICD-10-CM

## 2023-09-13 LAB — PSA: Prostate Specific Ag, Serum: 4.5 ng/mL — ABNORMAL HIGH (ref 0.0–4.0)

## 2023-09-17 ENCOUNTER — Ambulatory Visit: Payer: Self-pay | Admitting: Urology

## 2023-09-17 VITALS — BP 128/80 | HR 102 | Ht 70.0 in | Wt 200.8 lb

## 2023-09-17 DIAGNOSIS — N401 Enlarged prostate with lower urinary tract symptoms: Secondary | ICD-10-CM | POA: Diagnosis not present

## 2023-09-17 DIAGNOSIS — R972 Elevated prostate specific antigen [PSA]: Secondary | ICD-10-CM

## 2023-09-17 DIAGNOSIS — N138 Other obstructive and reflux uropathy: Secondary | ICD-10-CM

## 2023-09-17 DIAGNOSIS — Z85528 Personal history of other malignant neoplasm of kidney: Secondary | ICD-10-CM | POA: Diagnosis not present

## 2023-09-17 LAB — BLADDER SCAN AMB NON-IMAGING: Scan Result: 154

## 2023-09-17 MED ORDER — FINASTERIDE 5 MG PO TABS: 5.0000 mg | ORAL_TABLET | Freq: Every day | ORAL | 3 refills | Status: AC

## 2023-09-17 MED ORDER — TAMSULOSIN HCL 0.4 MG PO CAPS: 0.4000 mg | ORAL_CAPSULE | Freq: Every day | ORAL | 3 refills | Status: AC

## 2023-09-17 NOTE — Progress Notes (Signed)
 Jason Contreras,acting as a scribe for Jason Gimenez, MD.,have documented all relevant documentation on the behalf of Jason Reuther, MD,as directed by  Jason Gimenez, MD while in the presence of Jason Gimenez, MD.  09/17/23 2:08 PM   Jason Contreras 07-08-1940 [id number]  Referring provider: Lemar Pyles, NP [address] [place],  [place] [address]  No chief complaint on file.   HPI: 83 year old male with a personal history of BPH, elevated PSA, and renal cell carcinoma presents today for follow-up. He has been on chronic Finasteride  and Flomax  for BPH management.   He is status post negative prostate biopsy in 2017. He had a prostate MRI in 2021 with a PI-RADS 5 lesion. Repeat prostate biopsy was negative and his PSA stabilized.    He's status post partial nephrectomy in 2017 for a 1.8 centimeter papillary renal cell carcinoma followed by cryoablation of a clear cell carcinoma in 2019 for secondary lesion.  His PSA is currently 4.5, which is slightly increased from last year but generally stable.   He has a history of partial nephrectomy for renal cell carcinoma.   A recent CT scan of the pelvis, performed on 09/04/2023, was reviewed by the provider and showed a defect in the right midpole consistent with his history of partial nephrectomy, with no new renal lesions appreciated.   He reports incomplete bladder emptying, which is stable and not bothersome to him.   He admits to not drinking enough water, which the provider advised him to increase.   He had a tooth extraction recently and is scheduled for further dental procedures.   He has multiple upcoming medical appointments, including with a rheumatologist.  Results for orders placed or performed in visit on 09/17/23  BLADDER SCAN AMB NON-IMAGING  Result Value Ref Range   Scan Result 154 ml     IPSS     Row Name 09/17/23 1000         International Prostate Symptom Score   How often have you had the sensation  of not emptying your bladder? Not at All     How often have you had to urinate less than every two hours? Less than 1 in 5 times     How often have you found you stopped and started again several times when you urinated? Not at All     How often have you found it difficult to postpone urination? Not at All     How often have you had a weak urinary stream? Not at All     How often have you had to strain to start urination? Not at All     How many times did you typically get up at night to urinate? 1 Time     Total IPSS Score 2       Quality of Life due to urinary symptoms   If you were to spend the rest of your life with your urinary condition just the way it is now how would you feel about that? Pleased              Score:  1-7 Mild 8-19 Moderate 20-35 Severe    PMH: Past Medical History:  Diagnosis Date   Adenocarcinoma, renal cell (HCC)    Arthritis    Collagen vascular disease (HCC)    Diabetes mellitus without complication (HCC)    Elevated PSA    Headache    Hematuria    Hyperlipidemia    Hypertension  Neuropathy    Obesity    PONV (postoperative nausea and vomiting)    Renal insufficiency    Right renal mass     Surgical History: Past Surgical History:  Procedure Laterality Date   APPENDECTOMY     CRYOABLATION  10/17/2017   IR RADIOLOGIST EVAL & MGMT  07/17/2017   IR RADIOLOGIST EVAL & MGMT  08/13/2017   IR RADIOLOGIST EVAL & MGMT  11/12/2017   IR RADIOLOGIST EVAL & MGMT  03/05/2018   RADIOLOGY WITH ANESTHESIA Right 10/17/2017   Procedure: CT WITH ANESTHESIA RENAL CRYOABLATION;  Surgeon: Jason Saber, MD;  Location: [facility];  Service: Anesthesiology;  Laterality: Right;   ROBOTIC ASSITED PARTIAL NEPHRECTOMY Right 12/12/2015   Procedure: ROBOTIC ASSITED PARTIAL NEPHRECTOMY;  Surgeon: Jason Gimenez, MD;  Location: [facility];  Service: Urology;  Laterality: Right;    Home Medications:  Allergies as of 09/17/2023       Reactions   Dapagliflozin  Nausea And  Vomiting   Made him really sick   Morphine  Nausea And Vomiting   Trulicity  [dulaglutide ] Nausea And Vomiting        Medication List        Accurate as of Sep 17, 2023  2:08 PM. If you have any questions, ask your nurse or doctor.          acetaminophen  325 MG tablet Commonly known as: TYLENOL  Take 650 mg by mouth every 6 (six) hours as needed. Takes at bedtime and occasionally in am for back/hip pain   amoxicillin  500 MG capsule Commonly known as: AMOXIL  Take 500 mg by mouth every 6 (six) hours.   ascorbic acid 500 MG tablet Commonly known as: VITAMIN C Take 500 mg by mouth daily.   atorvastatin  20 MG tablet Commonly known as: LIPITOR TAKE 1 TABLET BY MOUTH  DAILY AT 6 PM.   carvedilol  6.25 MG tablet Commonly known as: COREG  Take 1 tablet (6.25 mg total) by mouth 2 (two) times daily.   cholecalciferol  25 MCG (1000 UNIT) tablet Commonly known as: VITAMIN D3 Take 2,000 Units by mouth daily.   DULoxetine  60 MG capsule Commonly known as: CYMBALTA  Take 1 capsule (60 mg total) by mouth daily.   finasteride  5 MG tablet Commonly known as: PROSCAR  Take 1 tablet (5 mg total) by mouth daily.   folic acid  1 MG tablet Commonly known as: FOLVITE  Take 1 tablet (1 mg total) by mouth daily.   gabapentin  600 MG tablet Commonly known as: NEURONTIN  Take 600 MG (1 tablet) by mouth at 0800 and 1300 hours, then before bedtime take 900 MG (1 1/2 tablets) by mouth.   glimepiride  1 MG tablet Commonly known as: AMARYL  Take 0.5 tablets (0.5 mg total) by mouth daily with breakfast.   lidocaine  4 % cream Commonly known as: LMX Apply 1 application topically as needed.   metFORMIN  500 MG tablet Commonly known as: GLUCOPHAGE  TAKE 2 TABLETS BY MOUTH  TWICE DAILY   methotrexate  2.5 MG tablet Commonly known as: RHEUMATREX Take 6 of the 2.5 MG tablets by mouth once a week per rheumatology instruction.  Caution:Chemotherapy. Protect from light.   mometasone  0.1 % cream Commonly  known as: ELOCON  APPLY  CREAM TOPICALLY ONCE DAILY   sitaGLIPtin  100 MG tablet Commonly known as: Januvia  Take 1 tablet (100 mg total) by mouth daily.   SUPER B COMPLEX/FA/VIT C PO Take 1 tablet by mouth daily. Contains 400 mcg folic acid    tamsulosin  0.4 MG Caps capsule Commonly known as: FLOMAX  Take 1  capsule (0.4 mg total) by mouth daily.        Allergies:  Allergies  Allergen Reactions   Dapagliflozin  Nausea And Vomiting    Made him really sick   Morphine  Nausea And Vomiting   Trulicity  [Dulaglutide ] Nausea And Vomiting    Family History: Family History  Problem Relation Age of Onset   Bone cancer Mother    Cancer Mother    Cancer Father    Stroke Brother    Kidney disease Neg Hx     Social History:  reports that he has never smoked. He has never used smokeless tobacco. He reports that he does not drink alcohol and does not use drugs.   Physical Exam: BP 128/80   Pulse (!) 102   Ht 5\' 10"  (1.778 m)   Wt 200 lb 12.8 oz (91.1 kg)   BMI 28.81 kg/m   Constitutional:  Alert and oriented, No acute distress. HEENT: [place] AT, moist mucus membranes.  Trachea midline, no masses. Neurologic: Grossly intact, no focal deficits, moving all 4 extremities. Psychiatric: Normal mood and affect.  Pertinent Imaging: EXAM: CT ABDOMEN AND PELVIS WITHOUT AND WITH CONTRAST   TECHNIQUE: Multidetector CT imaging of the abdomen and pelvis was performed following the standard protocol before and following the bolus administration of intravenous contrast.   RADIATION DOSE REDUCTION: This exam was performed according to the departmental dose-optimization program which includes automated exposure control, adjustment of the mA and/or kV according to patient size and/or use of iterative reconstruction technique.   CONTRAST:  OMNIPAQUE  IOHEXOL  300 MG/ML  SOLN   COMPARISON:  09/07/2021   FINDINGS: Lower chest: Coronary and descending thoracic aortic atherosclerosis.    Hepatobiliary: Unremarkable   Pancreas: Dilated dorsal pancreatic duct especially distally in the pancreatic head where there is fusiform dilatation, current diameter up to 0.9 cm on image 84 series 15 and previously 0.6 cm. I do not see a definite abnormal enhancing lesion or specific cause. The pancreas is moderately atrophic.   Spleen: Unremarkable   Adrenals/Urinary Tract: Adrenal glands unremarkable. The left kidney appears normal.   Stable scarring with high density precontrast material indicating partial nephrectomy posterior scarring/ablation site unchanged. Overall no findings of active malignancy. In the right kidney anteriorly, no morphologic change to constitute evidence of recurrence.   Stomach/Bowel: Sigmoid colon diverticulosis with scattered diverticula elsewhere in the colon. Prominent stool throughout the colon favors constipation.   Vascular/Lymphatic: Atherosclerosis is present, including aortoiliac atherosclerotic disease. No pathologic adenopathy observed.   Reproductive: Unremarkable   Other: No supplemental non-categorized findings.   Musculoskeletal: Bone island in the right iliac bone. Lumbar spondylosis and degenerative disc disease contributing to multilevel impingement.   IMPRESSION: 1. Stable appearance of the right kidney, without findings of active malignancy. 2. Fusiform dilation of the distal dorsal pancreatic duct, current diameter up to 0.9 cm and previously 0.6 cm. I do not see a definite abnormal enhancing lesion or specific cause. No definite common bile duct dilatation. This could be further assessed with endoscopic ultrasound if clinically indicated; otherwise careful surveillance on follow up cross-sectional imaging would be recommended. 3. Prominent stool throughout the colon favors constipation. 4. Lumbar spondylosis and degenerative disc disease contributing to multilevel impingement. 5. Coronary and descending thoracic  aortic atherosclerosis. Aortic Atherosclerosis (ICD10-I70.0).     Electronically Signed   By: Freida Jes M.D.   On: 09/17/2023 13:20  This was personally reviewed and I agree with the radiologic interpretation.   Assessment & Plan:  1. BPH with LUTS - He is currently on Finasteride  and Flomax , which are effectively managing his symptoms. His PVR is stable at 154, and his IPSS is 11.  - Continue current medications.  - Encourage increased water intake to improve bladder emptying.  2. Elevated PSA - His PSA is 4.5, slightly increased from last year but stable overall.  - Continue monitoring PSA levels.  - No immediate intervention required.  3. History of Renal Cell Carcinoma with Partial Nephrectomy - Recent CT scan of the pelvis shows no new renal lesions. Awaiting final radiology report.  - No immediate concerns noted.  - Follow up on radiology report and inform him of any additional findings.  Return in about 1 year (around 09/16/2024) for PA visit with IPSS, PVR, PSA.  I have reviewed the above documentation for accuracy and completeness, and I agree with the above.   Jason Gimenez, MD     [facility] [address], [address] [place], [place] [address] [phone]

## 2023-09-21 NOTE — Patient Instructions (Signed)
Be Involved in Caring For Your Health:  Taking Medications When medications are taken as directed, they can greatly improve your health. But if they are not taken as prescribed, they may not work. In some cases, not taking them correctly can be harmful. To help ensure your treatment remains effective and safe, understand your medications and how to take them. Bring your medications to each visit for review by your provider.  Your lab results, notes, and after visit summary will be available on My Chart. We strongly encourage you to use this feature. If lab results are abnormal the clinic will contact you with the appropriate steps. If the clinic does not contact you assume the results are satisfactory. You can always view your results on My Chart. If you have questions regarding your health or results, please contact the clinic during office hours. You can also ask questions on My Chart.  We at [facility] are grateful that you chose [place] to provide your care. We strive to provide evidence-based and compassionate care and are always looking for feedback. If you get a survey from the clinic please complete this so we can hear your opinions.  Diabetes Mellitus and Exercise Regular exercise is important for your health, especially if you have diabetes mellitus. Exercise is not just about losing weight. It can also help you increase muscle strength and bone density and reduce body fat and stress. This can help your level of endurance and make you more fit and flexible. Why should I exercise if I have diabetes? Exercise has many benefits for people with diabetes. It can: Help lower and control your blood sugar (glucose). Help your body respond better and become more sensitive to the hormone insulin. Reduce how much insulin your body needs. Lower your risk for heart disease by: Lowering how much "bad" cholesterol and triglycerides you have in your body. Increasing how much "good" cholesterol  you have in your body. Lowering your blood pressure. Lowering your blood glucose levels. What is my activity plan? Your health care provider or an expert trained in diabetes care (certified diabetes educator) can help you make an activity plan. This plan can help you find the type of exercise that works for you. It may also tell you how often to exercise and for how long. Be sure to: Get at least 150 minutes of medium-intensity or high-intensity exercise each week. This may involve brisk walking, biking, or water aerobics. Do stretching and strengthening exercises at least 2 times a week. This may involve yoga or weight lifting. Spread out your activity over at least 3 days of the week. Get some form of physical activity each day. Do not go more than 2 days in a row without some kind of activity. Avoid being inactive for more than 30 minutes at a time. Take frequent breaks to walk or stretch. Choose activities that you enjoy. Set goals that you know you can accomplish. Start slowly and increase the intensity of your exercise over time. How do I manage my diabetes during exercise?  Monitor your blood glucose Check your blood glucose before and after you exercise. If your blood glucose is 240 mg/dL (40.9 mmol/L) or higher before you exercise, check your urine for ketones. These are chemicals created by the liver. If you have ketones in your urine, do not exercise until your blood glucose returns to normal. If your blood glucose is 100 mg/dL (5.6 mmol/L) or lower, eat a snack that has 15-20 grams of carbohydrate in  it. Check your blood glucose 15 minutes after the snack to make sure that your level is above 100 mg/dL (5.6 mmol/L) before you start to exercise. Your risk for low blood glucose (hypoglycemia) goes up during and after exercise. Know the symptoms of this condition and how to treat it. Follow these instructions at home: Keep a carbohydrate snack on hand for use before, during, and after  exercise. This can help prevent or treat hypoglycemia. Avoid injecting insulin into parts of your body that are going to be used during exercise. This may include: Your arms, when you are going to play tennis. Your legs, when you are about to go jogging. Keep track of your exercise habits. This can help you and your health care provider watch and adjust your activity plan. Write down: What you eat before and after you exercise. Blood glucose levels before and after you exercise. The type and amount of exercise you do. Talk to your health care provider before you start a new activity. They may need to: Make sure that the activity is safe for you. Adjust your insulin, other medicines, and food that you eat. Drink water while you exercise. This can stop you from losing too much water (dehydration). It can also prevent problems caused by having a lot of heat in your body (heat stroke). Where to find more information American Diabetes Association: diabetes.org Association of Diabetes Care & Education Specialists: diabeteseducator.org This information is not intended to replace advice given to you by your health care provider. Make sure you discuss any questions you have with your health care provider. Document Revised: 10/10/2021 Document Reviewed: 10/10/2021 Elsevier Patient Education  2024 [employer].

## 2023-09-24 ENCOUNTER — Encounter: Payer: Self-pay | Admitting: Nurse Practitioner

## 2023-09-24 ENCOUNTER — Ambulatory Visit (INDEPENDENT_AMBULATORY_CARE_PROVIDER_SITE_OTHER): Payer: Medicare Other | Admitting: Nurse Practitioner

## 2023-09-24 VITALS — BP 116/64 | HR 83 | Temp 98.0°F | Ht 69.0 in | Wt 200.0 lb

## 2023-09-24 DIAGNOSIS — E1169 Type 2 diabetes mellitus with other specified complication: Secondary | ICD-10-CM | POA: Diagnosis not present

## 2023-09-24 DIAGNOSIS — E1140 Type 2 diabetes mellitus with diabetic neuropathy, unspecified: Secondary | ICD-10-CM | POA: Diagnosis not present

## 2023-09-24 DIAGNOSIS — I700 Atherosclerosis of aorta: Secondary | ICD-10-CM | POA: Diagnosis not present

## 2023-09-24 DIAGNOSIS — I152 Hypertension secondary to endocrine disorders: Secondary | ICD-10-CM

## 2023-09-24 DIAGNOSIS — Z7984 Long term (current) use of oral hypoglycemic drugs: Secondary | ICD-10-CM | POA: Diagnosis not present

## 2023-09-24 DIAGNOSIS — D692 Other nonthrombocytopenic purpura: Secondary | ICD-10-CM | POA: Diagnosis not present

## 2023-09-24 DIAGNOSIS — M0609 Rheumatoid arthritis without rheumatoid factor, multiple sites: Secondary | ICD-10-CM

## 2023-09-24 DIAGNOSIS — E1159 Type 2 diabetes mellitus with other circulatory complications: Secondary | ICD-10-CM | POA: Diagnosis not present

## 2023-09-24 DIAGNOSIS — E785 Hyperlipidemia, unspecified: Secondary | ICD-10-CM

## 2023-09-24 DIAGNOSIS — R972 Elevated prostate specific antigen [PSA]: Secondary | ICD-10-CM

## 2023-09-24 DIAGNOSIS — E119 Type 2 diabetes mellitus without complications: Secondary | ICD-10-CM

## 2023-09-24 LAB — BAYER DCA HB A1C WAIVED: HB A1C (BAYER DCA - WAIVED): 6.4 % — ABNORMAL HIGH (ref 4.8–5.6)

## 2023-09-24 MED ORDER — TRIAMCINOLONE ACETONIDE 0.1 % EX CREA: 1.0000 | TOPICAL_CREAM | Freq: Two times a day (BID) | CUTANEOUS | 3 refills | Status: DC

## 2023-09-24 NOTE — Assessment & Plan Note (Signed)
Chronic, stable, noted on exams.  Recommend continue to perform gentle skin care and monitor for skin breakdown, if present notify provider.

## 2023-09-24 NOTE — Progress Notes (Signed)
 triam  BP 116/64 (BP Location: Left Arm, Patient Position: Sitting, Cuff Size: Normal)   Pulse 83   Temp 98 F (36.7 C) (Oral)   Ht 5\' 9"  (1.753 m)   Wt 200 lb (90.7 kg)   SpO2 95%   BMI 29.53 kg/m    Subjective:    Patient ID: Jason Contreras, male    DOB: [birth year], 83 y.o.   MRN: [id number]  HPI: Jason Contreras is a 83 y.o. male  Chief Complaint  Patient presents with   Diabetes   Hyperlipidemia   Hypertension   Rheumatoid Arthritis   DIABETES February A1c 6.5%. Taking Metformin  1000 MG BID, Glimepiride  0.5 MG daily, and Januvia  100 MG daily.  Takes Gabapentin  600 MG BID and 900 MG at night, Duloxetine , and B12 for neuropathy. Occasional issues at night. Hypoglycemic episodes:no Polydipsia/polyuria: no Visual disturbance: no Chest pain: no Paresthesias: no Glucose Monitoring: no             Accucheck frequency: not checking             Fasting glucose:              Post prandial:             Evening:             Before meals: Taking Insulin ?: no             Long acting insulin :             Short acting insulin : Blood Pressure Monitoring: a few times a week Retinal Examination: Up To Date Foot Exam: Up To Date Pneumovax: Up To Date Influenza: Up to Date Aspirin: no    HYPERTENSION / HYPERLIPIDEMIA Taking Atorvastatin  20 MG and Coreg  6.25 MG BID. Satisfied with current treatment? yes Duration of hypertension: chronic BP monitoring frequency: a few times a week BP range: varies BP medication side effects: no Duration of hyperlipidemia: chronic Cholesterol medication side effects: no Cholesterol supplements: none Medication compliance: good compliance Aspirin: no Recent stressors: no Recurrent headaches: no Visual changes: no Palpitations: no Dyspnea: no Chest pain: no Lower extremity edema: no Dizzy/lightheaded: no    RHEUMATOID ARTHRITIS Has visits with rheumatology for RA of multiple sites with negative Rf, last visit 05/29/23.  Symptoms improved  Methotrexate .   Pain control status: stable Duration: chronic Location: multiple areas What Activities task can be accomplished with current medication? ADL's daily Previous pain specialty evaluation: yes Non-narcotic analgesic meds: yes   ELEVATED PSA: Last visit with urology on 09/17/22 -- returns in one year for CT scan and testing.  Had negative biopsy.  Has history of kidney cancer to right side, had two operations on this.  Continues on Tamsulosin  and Finasteride .   Relevant past medical, surgical, family and social history reviewed and updated as indicated. Interim medical history since our last visit reviewed. Allergies and medications reviewed and updated.  Review of Systems  Constitutional:  Negative for activity change, diaphoresis, fatigue and fever.  Respiratory:  Negative for cough, chest tightness, shortness of breath and wheezing.   Cardiovascular:  Negative for chest pain, palpitations and leg swelling.  Gastrointestinal: Negative.   Endocrine: Negative for polydipsia, polyphagia and polyuria.  Neurological:  Negative for dizziness, syncope, weakness and numbness.  Psychiatric/Behavioral: Negative.      Per HPI unless specifically indicated above     Objective:    BP 116/64 (BP Location: Left Arm, Patient Position: Sitting, Cuff Size: Normal)   Pulse 83  Temp 98 F (36.7 C) (Oral)   Ht 5\' 9"  (1.753 m)   Wt 200 lb (90.7 kg)   SpO2 95%   BMI 29.53 kg/m   Wt Readings from Last 3 Encounters:  09/24/23 200 lb (90.7 kg)  09/17/23 200 lb 12.8 oz (91.1 kg)  06/24/23 205 lb 6.4 oz (93.2 kg)    Physical Exam Vitals and nursing note reviewed.  Constitutional:      General: He is awake. He is not in acute distress.    Appearance: He is well-developed and well-groomed. He is obese. He is not ill-appearing or toxic-appearing.  HENT:     Head: Normocephalic and atraumatic.     Right Ear: Hearing normal. No drainage.     Left Ear: Hearing normal. No drainage.   Eyes:     General: Lids are normal.        Right eye: No discharge.        Left eye: No discharge.     Conjunctiva/sclera: Conjunctivae normal.     Pupils: Pupils are equal, round, and reactive to light.  Neck:     Thyroid : No thyromegaly.     Vascular: No carotid bruit or JVD.     Trachea: Trachea normal.  Cardiovascular:     Rate and Rhythm: Normal rate and regular rhythm.     Heart sounds: Normal heart sounds, S1 normal and S2 normal. No murmur heard.    No gallop.  Pulmonary:     Effort: Pulmonary effort is normal. No accessory muscle usage or respiratory distress.     Breath sounds: Normal breath sounds.  Abdominal:     General: Bowel sounds are normal. There is no distension.     Palpations: Abdomen is soft. There is no hepatomegaly.     Tenderness: There is no abdominal tenderness. There is no right CVA tenderness or left CVA tenderness.     Hernia: No hernia is present.  Musculoskeletal:        General: Normal range of motion.     Cervical back: Normal range of motion and neck supple.     Right lower leg: No edema.     Left lower leg: No edema.  Skin:    General: Skin is warm and dry.     Capillary Refill: Capillary refill takes less than 2 seconds.     Comments: Scattered pale purple/yellow bruises noted to BUE.  Neurological:     Mental Status: He is alert and oriented to person, place, and time.     Coordination: Coordination is intact.     Gait: Gait is intact.     Deep Tendon Reflexes: Reflexes are normal and symmetric.     Reflex Scores:      Brachioradialis reflexes are 2+ on the right side and 2+ on the left side.      Patellar reflexes are 2+ on the right side and 2+ on the left side. Psychiatric:        Mood and Affect: Mood normal.        Behavior: Behavior normal. Behavior is cooperative.        Thought Content: Thought content normal.        Judgment: Judgment normal.    Results for orders placed or performed in visit on 09/17/23  BLADDER SCAN AMB  NON-IMAGING   Collection Time: 09/17/23 10:08 AM  Result Value Ref Range   Scan Result 154 ml       Assessment & Plan:   Problem  List Items Addressed This Visit       Cardiovascular and Mediastinum   Senile purpura (HCC)   Chronic, stable, noted on exams.  Recommend continue to perform gentle skin care and monitor for skin breakdown, if present notify provider.      Hypertension associated with diabetes (HCC)   Chronic, stable.  BP at goal for age in office today on recheck, avoid hypotension due to age.  Recommend he monitor BP at home at least three mornings a week + focus on DASH diet.  Continue current medication regimen and adjust as needed.  Labs: A1c, BMP.  Urine ALB 16 June 2023.  May benefit addition of ACE or ARB in future and transition off BB.      Relevant Orders   Bayer DCA Hb A1c Waived   Basic metabolic panel with GFR   Aortic atherosclerosis (HCC)   Ongoing.  Noted on CXR 07/20/19.  Continue statin and ASA daily for prevention.  Monitor closely.  Educated patient on this.        Endocrine   Type 2 diabetes, controlled, with neuropathy (HCC) - Primary   Chronic, ongoing in patient with history of renal cell CA.  A1c today 6.4%, remains at goal.  Did not tolerate Trulicity , GI issues.  Farxiga  tried with GI issues presenting.  - Continue Metformin  1000 MG BID, Januvia  100 MG daily, and low dose Amaryl , which he has taken in past -- 0.5 MG and ensure no frequent BS <70, he is aware to notify provider if this presents.  Goal to avoid insulin  if possible. - Monitor BS daily in morning at home.  - Eye exam up to date.  Foot exam up to date - Consider change from BB to ACE/ARB in future.  Statin on board. - Return in 3 months, sooner if needed dependent on labs.      Relevant Orders   Bayer DCA Hb A1c Waived   Hyperlipidemia associated with type 2 diabetes mellitus (HCC)   Chronic, ongoing.  Continue current medication regimen and adjust as needed.  Lipid panel  today.      Relevant Orders   Bayer DCA Hb A1c Waived   Lipid Panel w/o Chol/HDL Ratio   Diabetes mellitus treated with oral medication (HCC)   Refer to diabetes with neuropathy plan of care.      Relevant Orders   Bayer DCA Hb A1c Waived     Musculoskeletal and Integument   Rheumatoid arthritis of multiple sites with negative rheumatoid factor (HCC)   Chronic, ongoing.  Followed by rheumatology with improved functional status.  Continue this collaboration and current regimen as prescribed by them.  Recent note reviewed.  Discussed that he can take occasional Tylenol  as needed for back pain, max dosing 4000 MG daily.        Other   Elevated PSA   Continue collaboration with urology.  Recent note reviewed.  PSA monitored by them.          Follow up plan: Return in about 3 months (around 12/25/2023) for T2DM, HTN/HLD, RA.

## 2023-09-24 NOTE — Assessment & Plan Note (Signed)
 Chronic, ongoing in patient with history of renal cell CA.  A1c today 6.4%, remains at goal.  Did not tolerate Trulicity , GI issues.  Farxiga  tried with GI issues presenting.  - Continue Metformin  1000 MG BID, Januvia  100 MG daily, and low dose Amaryl , which he has taken in past -- 0.5 MG and ensure no frequent BS <70, he is aware to notify provider if this presents.  Goal to avoid insulin  if possible. - Monitor BS daily in morning at home.  - Eye exam up to date.  Foot exam up to date - Consider change from BB to ACE/ARB in future.  Statin on board. - Return in 3 months, sooner if needed dependent on labs.

## 2023-09-24 NOTE — Assessment & Plan Note (Addendum)
 Chronic, ongoing.  Followed by rheumatology with improved functional status.  Continue this collaboration and current regimen as prescribed by them.  Recent note reviewed.  Discussed that he can take occasional Tylenol  as needed for back pain, max dosing 4000 MG daily.

## 2023-09-24 NOTE — Assessment & Plan Note (Signed)
 Chronic, ongoing.  Continue current medication regimen and adjust as needed. Lipid panel today.

## 2023-09-24 NOTE — Assessment & Plan Note (Signed)
Ongoing.  Noted on CXR 07/20/19.  Continue statin and ASA daily for prevention.  Monitor closely.  Educated patient on this. 

## 2023-09-24 NOTE — Assessment & Plan Note (Signed)
Continue collaboration with urology.  Recent note reviewed.  PSA monitored by them.

## 2023-09-24 NOTE — Assessment & Plan Note (Signed)
 Refer to diabetes with neuropathy plan of care

## 2023-09-24 NOTE — Assessment & Plan Note (Signed)
 Chronic, stable.  BP at goal for age in office today on recheck, avoid hypotension due to age.  Recommend he monitor BP at home at least three mornings a week + focus on DASH diet.  Continue current medication regimen and adjust as needed.  Labs: A1c, BMP.  Urine ALB 16 June 2023.  May benefit addition of ACE or ARB in future and transition off BB.

## 2023-09-25 ENCOUNTER — Ambulatory Visit: Payer: Self-pay | Admitting: Nurse Practitioner

## 2023-09-25 LAB — BASIC METABOLIC PANEL WITH GFR
BUN/Creatinine Ratio: 16 (ref 10–24)
BUN: 13 mg/dL (ref 8–27)
CO2: 23 mmol/L (ref 20–29)
Calcium: 9.2 mg/dL (ref 8.6–10.2)
Chloride: 103 mmol/L (ref 96–106)
Creatinine, Ser: 0.82 mg/dL (ref 0.76–1.27)
Glucose: 84 mg/dL (ref 70–99)
Potassium: 4.4 mmol/L (ref 3.5–5.2)
Sodium: 141 mmol/L (ref 134–144)
eGFR: 89 mL/min/{1.73_m2} (ref >59)

## 2023-09-25 LAB — LIPID PANEL W/O CHOL/HDL RATIO
Cholesterol, Total: 101 mg/dL (ref 100–199)
HDL: 40 mg/dL (ref >39)
LDL Chol Calc (NIH): 43 mg/dL (ref 0–99)
Triglycerides: 90 mg/dL (ref 0–149)
VLDL Cholesterol Cal: 18 mg/dL (ref 5–40)

## 2023-09-25 NOTE — Progress Notes (Signed)
 Contacted via MyChart   Good day Jason Contreras, your labs have returned and remain stable.  No changes needed.:) Keep being stellar!!  Thank you for allowing me to participate in your care.  I appreciate you. Kindest regards, Hobson Lax

## 2023-09-26 ENCOUNTER — Other Ambulatory Visit: Payer: Self-pay | Admitting: Nurse Practitioner

## 2023-09-26 MED ORDER — TRIAMCINOLONE ACETONIDE 0.1 % EX CREA: 1.0000 | TOPICAL_CREAM | Freq: Two times a day (BID) | CUTANEOUS | 3 refills | Status: AC

## 2023-09-26 NOTE — Telephone Encounter (Signed)
 RX was sent to mail order. Can we resend to [facility] for the patient?

## 2023-09-26 NOTE — Telephone Encounter (Signed)
 Copied from CRM [id number]. Topic: Clinical - Medication Question >> Sep 26, 2023  9:36 AM Rosamond Comes wrote: Reason for CRM: patient calling stating Jolene [name] NP was going to send in a steroid cream for arm rash. Send prescription to:  [facility]  [address] [place], [place] [address] [phone]   Patient phone [phone]

## 2023-09-26 NOTE — Addendum Note (Signed)
 Addended by: Delphia Ficks on: 09/26/2023 10:11 AM   Modules accepted: Orders

## 2023-09-30 DIAGNOSIS — M150 Primary generalized (osteo)arthritis: Secondary | ICD-10-CM | POA: Diagnosis not present

## 2023-09-30 DIAGNOSIS — Z79899 Other long term (current) drug therapy: Secondary | ICD-10-CM | POA: Diagnosis not present

## 2023-09-30 DIAGNOSIS — M0609 Rheumatoid arthritis without rheumatoid factor, multiple sites: Secondary | ICD-10-CM | POA: Diagnosis not present

## 2023-12-27 NOTE — Patient Instructions (Signed)

## 2023-12-29 ENCOUNTER — Ambulatory Visit (INDEPENDENT_AMBULATORY_CARE_PROVIDER_SITE_OTHER): Admitting: Nurse Practitioner

## 2023-12-29 ENCOUNTER — Encounter: Payer: Self-pay | Admitting: Nurse Practitioner

## 2023-12-29 VITALS — BP 134/82 | HR 89 | Temp 98.7°F | Ht 69.0 in | Wt 206.0 lb

## 2023-12-29 DIAGNOSIS — E119 Type 2 diabetes mellitus without complications: Secondary | ICD-10-CM | POA: Diagnosis not present

## 2023-12-29 DIAGNOSIS — E1159 Type 2 diabetes mellitus with other circulatory complications: Secondary | ICD-10-CM | POA: Diagnosis not present

## 2023-12-29 DIAGNOSIS — E1169 Type 2 diabetes mellitus with other specified complication: Secondary | ICD-10-CM

## 2023-12-29 DIAGNOSIS — I700 Atherosclerosis of aorta: Secondary | ICD-10-CM

## 2023-12-29 DIAGNOSIS — D692 Other nonthrombocytopenic purpura: Secondary | ICD-10-CM

## 2023-12-29 DIAGNOSIS — M0609 Rheumatoid arthritis without rheumatoid factor, multiple sites: Secondary | ICD-10-CM | POA: Diagnosis not present

## 2023-12-29 DIAGNOSIS — E785 Hyperlipidemia, unspecified: Secondary | ICD-10-CM | POA: Diagnosis not present

## 2023-12-29 DIAGNOSIS — Z7984 Long term (current) use of oral hypoglycemic drugs: Secondary | ICD-10-CM

## 2023-12-29 DIAGNOSIS — I152 Hypertension secondary to endocrine disorders: Secondary | ICD-10-CM

## 2023-12-29 DIAGNOSIS — E1140 Type 2 diabetes mellitus with diabetic neuropathy, unspecified: Secondary | ICD-10-CM

## 2023-12-29 LAB — BAYER DCA HB A1C WAIVED: HB A1C (BAYER DCA - WAIVED): 6.4 % — ABNORMAL HIGH (ref 4.8–5.6)

## 2023-12-29 NOTE — Assessment & Plan Note (Signed)
 Chronic, ongoing.  Followed by rheumatology with improved functional status.  Continue this collaboration and current regimen as prescribed by them.  Recent note reviewed.  Discussed that he can take occasional Tylenol  as needed for back pain, max dosing 4000 MG daily.

## 2023-12-29 NOTE — Assessment & Plan Note (Signed)
 Refer to diabetes with neuropathy plan of care

## 2023-12-29 NOTE — Assessment & Plan Note (Signed)
Chronic, stable, noted on exams.  Recommend continue to perform gentle skin care and monitor for skin breakdown, if present notify provider.

## 2023-12-29 NOTE — Assessment & Plan Note (Signed)
 Chronic, ongoing.  Continue current medication regimen and adjust as needed. Lipid panel today.

## 2023-12-29 NOTE — Assessment & Plan Note (Signed)
 Chronic, stable.  BP at goal for age in office today on recheck, avoid hypotension due to age.  Recommend he monitor BP at home at least three mornings a week + focus on DASH diet.  Continue current medication regimen and adjust as needed.  Labs: A1c, CMP.  Urine ALB 16 June 2023.  May benefit addition of ACE or ARB in future and transition off BB.

## 2023-12-29 NOTE — Progress Notes (Signed)
 BP 134/82 (BP Location: Left Arm)   Pulse 89   Temp 98.7 F (37.1 C) (Oral)   Ht 5' 9 (1.753 m)   Wt 206 lb (93.4 kg)   SpO2 98%   BMI 30.42 kg/m    Subjective:    Patient ID: Jason Contreras, male    DOB: 05/16/1940, 83 y.o.   MRN: [id number]  HPI: Jason Contreras is a 83 y.o. male  Chief Complaint  Patient presents with   Diabetes   Hyperlipidemia   Hypertension   DIABETES May A1c 6.4%.  Continues to take Metformin  1000 MG BID, Glimepiride  0.5 MG daily, and Januvia  100 MG daily + takes Gabapentin  600 MG TID and B12 for neuropathy + Vitamin D . Hypoglycemic episodes:no Polydipsia/polyuria: no Visual disturbance: no Chest pain: no Paresthesias: no Glucose Monitoring: no  Accucheck frequency: occasional - 136 the other night, one episode of 228 after eating  Fasting glucose:   Post prandial:  Evening:  Before meals: Taking Insulin ?: no  Long acting insulin :  Short acting insulin : Blood Pressure Monitoring: a few times a week Retinal Examination: Up to Date Foot Exam: Up to Date Pneumovax: Not up to Date Influenza: Up to Date Aspirin: no   HYPERTENSION / HYPERLIPIDEMIA Continues to take Atorvastatin  20 MG and Coreg  6.25 MG BID.  Aortic atherosclerosis noted on CXR 07/20/19. Satisfied with current treatment? yes Duration of hypertension: chronic BP monitoring frequency: a few times a week BP range:  BP medication side effects: no Duration of hyperlipidemia: chronic Cholesterol medication side effects: no Cholesterol supplements: none Medication compliance: good compliance Aspirin: no Recent stressors: no Recurrent headaches: no Visual changes: no Palpitations: no Dyspnea: no Chest pain: no Lower extremity edema: no Dizzy/lightheaded: has occasional dizzy spells when looking upwards  RHEUMATOID ARTHRITIS Rheumatology for RA of multiple sites with negative Rf, last visit on 09/30/23.  Continues on Methotrexate  which offers benefit. Pain control status:  improved Duration: chronic Location: multiple areas What Activities task can be accomplished with current medication? ADL's daily Previous pain specialty evaluation: yes Non-narcotic analgesic meds: yes   Relevant past medical, surgical, family and social history reviewed and updated as indicated. Interim medical history since our last visit reviewed. Allergies and medications reviewed and updated.  Review of Systems  Constitutional:  Negative for activity change, diaphoresis, fatigue and fever.  Respiratory:  Negative for cough, chest tightness, shortness of breath and wheezing.   Cardiovascular:  Negative for chest pain, palpitations and leg swelling.  Gastrointestinal: Negative.   Endocrine: Negative for polydipsia, polyphagia and polyuria.  Neurological:  Negative for dizziness, syncope, weakness and numbness.  Psychiatric/Behavioral: Negative.      Per HPI unless specifically indicated above     Objective:    BP 134/82 (BP Location: Left Arm)   Pulse 89   Temp 98.7 F (37.1 C) (Oral)   Ht 5' 9 (1.753 m)   Wt 206 lb (93.4 kg)   SpO2 98%   BMI 30.42 kg/m   Wt Readings from Last 3 Encounters:  12/29/23 206 lb (93.4 kg)  09/24/23 200 lb (90.7 kg)  09/17/23 200 lb 12.8 oz (91.1 kg)    Physical Exam Vitals and nursing note reviewed.  Constitutional:      General: He is awake. He is not in acute distress.    Appearance: He is well-developed and well-groomed. He is obese. He is not ill-appearing or toxic-appearing.  HENT:     Head: Normocephalic and atraumatic.     Right Ear:  Hearing normal. No drainage.     Left Ear: Hearing normal. No drainage.  Eyes:     General: Lids are normal.        Right eye: No discharge.        Left eye: No discharge.     Conjunctiva/sclera: Conjunctivae normal.     Pupils: Pupils are equal, round, and reactive to light.  Neck:     Thyroid : No thyromegaly.     Vascular: No carotid bruit or JVD.     Trachea: Trachea normal.   Cardiovascular:     Rate and Rhythm: Normal rate and regular rhythm.     Heart sounds: Normal heart sounds, S1 normal and S2 normal. No murmur heard.    No gallop.  Pulmonary:     Effort: Pulmonary effort is normal. No accessory muscle usage or respiratory distress.     Breath sounds: Normal breath sounds.  Abdominal:     General: Bowel sounds are normal. There is no distension.     Palpations: Abdomen is soft. There is no hepatomegaly.     Tenderness: There is no abdominal tenderness. There is no right CVA tenderness or left CVA tenderness.     Hernia: No hernia is present.  Musculoskeletal:        General: Normal range of motion.     Cervical back: Normal range of motion and neck supple.     Right lower leg: No edema.     Left lower leg: No edema.  Skin:    General: Skin is warm and dry.     Capillary Refill: Capillary refill takes less than 2 seconds.     Comments: Scattered pale purple/yellow bruises noted to BUE.  Neurological:     Mental Status: He is alert and oriented to person, place, and time.     Coordination: Coordination is intact.     Gait: Gait is intact.     Deep Tendon Reflexes: Reflexes are normal and symmetric.     Reflex Scores:      Brachioradialis reflexes are 2+ on the right side and 2+ on the left side.      Patellar reflexes are 2+ on the right side and 2+ on the left side. Psychiatric:        Mood and Affect: Mood normal.        Behavior: Behavior normal. Behavior is cooperative.        Thought Content: Thought content normal.        Judgment: Judgment normal.     Results for orders placed or performed in visit on 12/29/23  Bayer DCA Hb A1c Waived   Collection Time: 12/29/23  3:31 PM  Result Value Ref Range   HB A1C (BAYER DCA - WAIVED) 6.4 (H) 4.8 - 5.6 %      Assessment & Plan:   Problem List Items Addressed This Visit       Cardiovascular and Mediastinum   Senile purpura (HCC)   Chronic, stable, noted on exams.  Recommend continue to  perform gentle skin care and monitor for skin breakdown, if present notify provider.      Hypertension associated with diabetes (HCC)   Chronic, stable.  BP at goal for age in office today on recheck, avoid hypotension due to age.  Recommend he monitor BP at home at least three mornings a week + focus on DASH diet.  Continue current medication regimen and adjust as needed.  Labs: A1c, CMP.  Urine ALB 16 June 2023.  May benefit addition of ACE or ARB in future and transition off BB.      Relevant Orders   Bayer DCA Hb A1c Waived (Completed)   Aortic atherosclerosis (HCC)   Ongoing.  Noted on CXR 07/20/19.  Continue statin and ASA daily for prevention.  Monitor closely.  Educated patient on this.        Endocrine   Type 2 diabetes, controlled, with neuropathy (HCC) - Primary   Chronic, ongoing in patient with history of renal cell CA.  A1c today 6.4%, remains at goal.  Did not tolerate Trulicity , GI issues.  Farxiga  tried with GI issues presenting.  - Continue Metformin  1000 MG BID, Januvia  100 MG daily, and low dose Amaryl , which he has taken in past -- 0.5 MG and ensure no frequent BS <70, he is aware to notify provider if this presents.  Goal to avoid insulin  if possible. - Monitor BS daily in morning at home.  - Eye exam up to date.  Foot exam up to date - Consider change from BB to ACE/ARB in future.  Statin on board. - Return in 3 months, sooner if needed dependent on labs.      Relevant Orders   Bayer DCA Hb A1c Waived (Completed)   Hyperlipidemia associated with type 2 diabetes mellitus (HCC)   Chronic, ongoing.  Continue current medication regimen and adjust as needed.  Lipid panel today.      Relevant Orders   Bayer DCA Hb A1c Waived (Completed)   Comprehensive metabolic panel with GFR   Lipid Panel w/o Chol/HDL Ratio   Diabetes mellitus treated with oral medication (HCC)   Refer to diabetes with neuropathy plan of care.      Relevant Orders   Bayer DCA Hb A1c Waived  (Completed)     Musculoskeletal and Integument   Rheumatoid arthritis of multiple sites with negative rheumatoid factor (HCC)   Chronic, ongoing.  Followed by rheumatology with improved functional status.  Continue this collaboration and current regimen as prescribed by them.  Recent note reviewed.  Discussed that he can take occasional Tylenol  as needed for back pain, max dosing 4000 MG daily.        Follow up plan: Return in about 3 months (around 03/30/2024) for T2DM, HTN/HLD, RA, ELEVATED PSA.

## 2023-12-29 NOTE — Assessment & Plan Note (Addendum)
 Chronic, ongoing in patient with history of renal cell CA.  A1c today 6.4%, remains at goal.  Did not tolerate Trulicity , GI issues.  Farxiga  tried with GI issues presenting.  - Continue Metformin  1000 MG BID, Januvia  100 MG daily, and low dose Amaryl , which he has taken in past -- 0.5 MG and ensure no frequent BS <70, he is aware to notify provider if this presents.  Goal to avoid insulin  if possible. - Monitor BS daily in morning at home.  - Eye exam up to date.  Foot exam up to date - Consider change from BB to ACE/ARB in future.  Statin on board. - Return in 3 months, sooner if needed dependent on labs.

## 2023-12-29 NOTE — Assessment & Plan Note (Signed)
Ongoing.  Noted on CXR 07/20/19.  Continue statin and ASA daily for prevention.  Monitor closely.  Educated patient on this. 

## 2023-12-30 ENCOUNTER — Ambulatory Visit: Payer: Self-pay | Admitting: Nurse Practitioner

## 2023-12-30 LAB — COMPREHENSIVE METABOLIC PANEL WITH GFR
ALT: 18 IU/L (ref 0–44)
AST: 21 IU/L (ref 0–40)
Albumin: 4.1 g/dL (ref 3.8–4.8)
Alkaline Phosphatase: 74 IU/L (ref 44–121)
BUN/Creatinine Ratio: 16 (ref 10–24)
BUN: 14 mg/dL (ref 8–27)
Bilirubin Total: 0.5 mg/dL (ref 0.0–1.2)
CO2: 24 mmol/L (ref 20–29)
Calcium: 9 mg/dL (ref 8.6–10.2)
Chloride: 101 mmol/L (ref 96–106)
Creatinine, Ser: 0.9 mg/dL (ref 0.76–1.27)
Globulin, Total: 1.7 g/dL (ref 1.5–4.5)
Glucose: 150 mg/dL — ABNORMAL HIGH (ref 70–99)
Potassium: 4.5 mmol/L (ref 3.5–5.2)
Sodium: 140 mmol/L (ref 134–144)
Total Protein: 5.8 g/dL — ABNORMAL LOW (ref 6.0–8.5)
eGFR: 86 mL/min/1.73 (ref >59)

## 2023-12-30 LAB — LIPID PANEL W/O CHOL/HDL RATIO
Cholesterol, Total: 119 mg/dL (ref 100–199)
HDL: 43 mg/dL (ref >39)
LDL Chol Calc (NIH): 49 mg/dL (ref 0–99)
Triglycerides: 158 mg/dL — ABNORMAL HIGH (ref 0–149)
VLDL Cholesterol Cal: 27 mg/dL (ref 5–40)

## 2023-12-30 NOTE — Progress Notes (Signed)
 Contacted via MyChart  Good afternoon Kalev, your labs have returned: - Kidney function, creatinine and eGFR, remains normal, as is liver function, AST and ALT.  - Lipid panel shows at goal LDL, triglycerides a little elevated. Continue all medications and focus on healthy diet choices.  Any questions? Keep being stellar!!  Thank you for allowing me to participate in your care.  I appreciate you. Kindest regards, Phat Dalton

## 2024-02-02 DIAGNOSIS — M150 Primary generalized (osteo)arthritis: Secondary | ICD-10-CM | POA: Diagnosis not present

## 2024-02-02 DIAGNOSIS — Z79899 Other long term (current) drug therapy: Secondary | ICD-10-CM | POA: Diagnosis not present

## 2024-02-02 DIAGNOSIS — M0609 Rheumatoid arthritis without rheumatoid factor, multiple sites: Secondary | ICD-10-CM | POA: Diagnosis not present

## 2024-02-26 ENCOUNTER — Other Ambulatory Visit: Payer: Self-pay | Admitting: Nurse Practitioner

## 2024-02-28 NOTE — Telephone Encounter (Signed)
 Too soon for refill, LRF 06/24/23 for 90 and 3 refills.  Requested Prescriptions  Pending Prescriptions Disp Refills   glimepiride  (AMARYL ) 1 MG tablet [Pharmacy Med Name: Glimepiride  1 MG Oral Tablet] 50 tablet 2    Sig: TAKE ONE-HALF TABLET BY MOUTH  DAILY WITH BREAKFAST     Endocrinology:  Diabetes - Sulfonylureas Passed - 02/28/2024  8:24 AM      Passed - HBA1C is between 0 and 7.9 and within 180 days    Hemoglobin A1C  Date Value Ref Range Status  12/27/2015 6.3  Final   HB A1C (BAYER DCA - WAIVED)  Date Value Ref Range Status  12/29/2023 6.4 (H) 4.8 - 5.6 % Final    Comment:             Prediabetes: 5.7 - 6.4          Diabetes: >6.4          Glycemic control for adults with diabetes: <7.0          Passed - Cr in normal range and within 360 days    Creatinine, Ser  Date Value Ref Range Status  12/29/2023 0.90 0.76 - 1.27 mg/dL Final         Passed - Valid encounter within last 6 months    Recent Outpatient Visits           2 months ago Type 2 diabetes, controlled, with neuropathy (HCC)   [place] [facility] [place], Jolene T, NP   5 months ago Type 2 diabetes, controlled, with neuropathy (HCC)   [place] [facility] [place], Jolene T, NP   8 months ago Type 2 diabetes, controlled, with neuropathy (HCC)   [place] [facility] [place], Melanie T, NP       Future Appointments             In 6 months McGowan, Clotilda LABOR, PA-C [facility] [place]             gabapentin  (NEURONTIN ) 600 MG tablet [Pharmacy Med Name: Gabapentin  600 MG Oral Tablet] 350 tablet 2    Sig: TAKE 1 TABLET BY MOUTH AT 0800  AND 1300 HOURS, THEN TAKE 1 AND  1/2 TABLETS BY MOUTH BEFORE  BEDTIME     Neurology: Anticonvulsants - gabapentin  Passed - 02/28/2024  8:24 AM      Passed - Cr in normal range and within 360 days    Creatinine, Ser  Date Value Ref Range Status  12/29/2023 0.90 0.76 - 1.27 mg/dL Final         Passed -  Completed PHQ-2 or PHQ-9 in the last 360 days      Passed - Valid encounter within last 12 months    Recent Outpatient Visits           2 months ago Type 2 diabetes, controlled, with neuropathy (HCC)   [place] [facility] [place], Humboldt T, NP   5 months ago Type 2 diabetes, controlled, with neuropathy (HCC)   [place] [facility] [place], Jolene T, NP   8 months ago Type 2 diabetes, controlled, with neuropathy (HCC)   [place] [facility] [place], Melanie DASEN, NP       Future Appointments             In 6 months McGowan, Clotilda LABOR RIGGERS [facility] [place]

## 2024-03-30 ENCOUNTER — Ambulatory Visit: Admitting: Nurse Practitioner

## 2024-04-13 ENCOUNTER — Other Ambulatory Visit: Payer: Self-pay | Admitting: Nurse Practitioner

## 2024-04-13 DIAGNOSIS — E1159 Type 2 diabetes mellitus with other circulatory complications: Secondary | ICD-10-CM

## 2024-04-15 NOTE — Telephone Encounter (Signed)
 All requested Rx  06/24/23 for 1 year supply Requested Prescriptions  Pending Prescriptions Disp Refills   carvedilol  (COREG ) 6.25 MG tablet [Pharmacy Med Name: Carvedilol  6.25 MG Oral Tablet] 200 tablet 2    Sig: TAKE 1 TABLET BY MOUTH TWICE  DAILY     Cardiovascular: Beta Blockers 3 Passed - 04/15/2024  2:42 PM      Passed - Cr in normal range and within 360 days    Creatinine, Ser  Date Value Ref Range Status  12/29/2023 0.90 0.76 - 1.27 mg/dL Final         Passed - AST in normal range and within 360 days    AST  Date Value Ref Range Status  12/29/2023 21 0 - 40 IU/L Final   AST (SGOT) Piccolo, Waived  Date Value Ref Range Status  10/13/2018 16 11 - 38 U/L Final         Passed - ALT in normal range and within 360 days    ALT  Date Value Ref Range Status  12/29/2023 18 0 - 44 IU/L Final   ALT (SGPT) Piccolo, Waived  Date Value Ref Range Status  10/13/2018 19 10 - 47 U/L Final         Passed - Last BP in normal range    BP Readings from Last 1 Encounters:  12/29/23 134/82         Passed - Last Heart Rate in normal range    Pulse Readings from Last 1 Encounters:  12/29/23 89         Passed - Valid encounter within last 6 months    Recent Outpatient Visits           3 months ago Type 2 diabetes, controlled, with neuropathy (HCC)   [place] [facility] [place], Conestee T, NP   6 months ago Type 2 diabetes, controlled, with neuropathy (HCC)   [place] [facility] [place], Jolene T, NP   9 months ago Type 2 diabetes, controlled, with neuropathy (HCC)   [place] [facility] [place], Melanie T, NP       Future Appointments             In 5 months McGowan, Clotilda LABOR, PA-C [facility] [place]             glimepiride  (AMARYL ) 1 MG tablet [Pharmacy Med Name: Glimepiride  1 MG Oral Tablet] 30 tablet 5    Sig: TAKE ONE-HALF TABLET BY MOUTH  DAILY WITH BREAKFAST     Endocrinology:  Diabetes - Sulfonylureas  Passed - 04/15/2024  2:42 PM      Passed - HBA1C is between 0 and 7.9 and within 180 days    Hemoglobin A1C  Date Value Ref Range Status  12/27/2015 6.3  Final   HB A1C (BAYER DCA - WAIVED)  Date Value Ref Range Status  12/29/2023 6.4 (H) 4.8 - 5.6 % Final    Comment:             Prediabetes: 5.7 - 6.4          Diabetes: >6.4          Glycemic control for adults with diabetes: <7.0          Passed - Cr in normal range and within 360 days    Creatinine, Ser  Date Value Ref Range Status  12/29/2023 0.90 0.76 - 1.27 mg/dL Final         Passed - Valid  encounter within last 6 months    Recent Outpatient Visits           3 months ago Type 2 diabetes, controlled, with neuropathy (HCC)   [place] [facility] [place], [place] T, NP   6 months ago Type 2 diabetes, controlled, with neuropathy (HCC)   [place] [facility] [place], Melanie T, NP   9 months ago Type 2 diabetes, controlled, with neuropathy (HCC)   [place] [facility] [place], Melanie T, NP       Future Appointments             In 5 months McGowan, Clotilda DELENA RIGGERS [facility] [place]             JANUVIA  100 MG tablet [Pharmacy Med Name: Januvia  100 MG Oral Tablet] 100 tablet 2    Sig: TAKE 1 TABLET BY MOUTH DAILY     Endocrinology:  Diabetes - DPP-4 Inhibitors Passed - 04/15/2024  2:42 PM      Passed - HBA1C is between 0 and 7.9 and within 180 days    Hemoglobin A1C  Date Value Ref Range Status  12/27/2015 6.3  Final   HB A1C (BAYER DCA - WAIVED)  Date Value Ref Range Status  12/29/2023 6.4 (H) 4.8 - 5.6 % Final    Comment:             Prediabetes: 5.7 - 6.4          Diabetes: >6.4          Glycemic control for adults with diabetes: <7.0          Passed - Cr in normal range and within 360 days    Creatinine, Ser  Date Value Ref Range Status  12/29/2023 0.90 0.76 - 1.27 mg/dL Final         Passed - Valid encounter within last 6 months     Recent Outpatient Visits           3 months ago Type 2 diabetes, controlled, with neuropathy (HCC)   [place] [facility] [place], Melanie T, NP   6 months ago Type 2 diabetes, controlled, with neuropathy (HCC)   [place] [facility] [place], Jolene T, NP   9 months ago Type 2 diabetes, controlled, with neuropathy (HCC)   [place] [facility] [place], Melanie T, NP       Future Appointments             In 5 months McGowan, Clotilda DELENA, PA-C [facility] [place]             atorvastatin  (LIPITOR) 20 MG tablet [Pharmacy Med Name: Atorvastatin  Calcium  20 MG Oral Tablet] 100 tablet 2    Sig: TAKE 1 TABLET BY MOUTH DAILY AT  6 PM.     Cardiovascular:  Antilipid - Statins Failed - 04/15/2024  2:42 PM      Failed - Lipid Panel in normal range within the last 12 months    Cholesterol, Total  Date Value Ref Range Status  12/29/2023 119 100 - 199 mg/dL Final   Cholesterol Piccolo, Waived  Date Value Ref Range Status  10/13/2018 101 <200 mg/dL Final    Comment:                            Desirable                <  200                         Borderline High      200- 239                         High                     >239    LDL Chol Calc (NIH)  Date Value Ref Range Status  12/29/2023 49 0 - 99 mg/dL Final   HDL  Date Value Ref Range Status  12/29/2023 43 >39 mg/dL Final   Triglycerides  Date Value Ref Range Status  12/29/2023 158 (H) 0 - 149 mg/dL Final   Triglycerides Piccolo,Waived  Date Value Ref Range Status  10/13/2018 91 <150 mg/dL Final    Comment:                            Normal                   <150                         Borderline High     150 - 199                         High                200 - 499                         Very High                >499          Passed - Patient is not pregnant      Passed - Valid encounter within last 12 months    Recent Outpatient Visits           3  months ago Type 2 diabetes, controlled, with neuropathy (HCC)   [place] [facility] [place], [place]ovin T, NP   6 months ago Type 2 diabetes, controlled, with neuropathy (HCC)   [place] [facility] [place], Jolene T, NP   9 months ago Type 2 diabetes, controlled, with neuropathy (HCC)   [place] [facility] [place], Melanie DASEN, NP       Future Appointments             In 5 months McGowan, Clotilda DELENA RIGGERS [facility] [place]

## 2024-04-23 ENCOUNTER — Other Ambulatory Visit: Payer: Self-pay | Admitting: Nurse Practitioner

## 2024-04-27 NOTE — Telephone Encounter (Signed)
 Rx 06/24/23 #315 3RF- 1 year supply- too soon Requested Prescriptions  Pending Prescriptions Disp Refills   gabapentin  (NEURONTIN ) 600 MG tablet [Pharmacy Med Name: Gabapentin  600 MG Oral Tablet] 210 tablet 5    Sig: TAKE 1 TABLET BY MOUTH AT 0800  AND 1300 HOURS, THEN TAKE 1 AND  1/2 TABLETS BY MOUTH BEFORE  BEDTIME     Neurology: Anticonvulsants - gabapentin  Passed - 04/27/2024  4:00 PM      Passed - Cr in normal range and within 360 days    Creatinine, Ser  Date Value Ref Range Status  12/29/2023 0.90 0.76 - 1.27 mg/dL Final         Passed - Completed PHQ-2 or PHQ-9 in the last 360 days      Passed - Valid encounter within last 12 months    Recent Outpatient Visits           4 months ago Type 2 diabetes, controlled, with neuropathy (HCC)   [place] [facility] [place], Ragsdale T, NP   7 months ago Type 2 diabetes, controlled, with neuropathy (HCC)   [place] [facility] [place], Jolene T, NP   10 months ago Type 2 diabetes, controlled, with neuropathy (HCC)    [facility] [place], Melanie DASEN, NP       Future Appointments             In 4 months McGowan, Clotilda DELENA RIGGERS [facility] [place]

## 2024-05-04 ENCOUNTER — Ambulatory Visit

## 2024-05-08 NOTE — Patient Instructions (Signed)

## 2024-05-10 ENCOUNTER — Ambulatory Visit: Admitting: Nurse Practitioner

## 2024-05-10 ENCOUNTER — Encounter: Payer: Self-pay | Admitting: Nurse Practitioner

## 2024-05-10 VITALS — BP 127/75 | HR 81 | Temp 98.1°F | Resp 17 | Ht 69.02 in | Wt 205.0 lb

## 2024-05-10 DIAGNOSIS — R972 Elevated prostate specific antigen [PSA]: Secondary | ICD-10-CM

## 2024-05-10 DIAGNOSIS — Z23 Encounter for immunization: Secondary | ICD-10-CM

## 2024-05-10 DIAGNOSIS — Z85528 Personal history of other malignant neoplasm of kidney: Secondary | ICD-10-CM

## 2024-05-10 DIAGNOSIS — M0609 Rheumatoid arthritis without rheumatoid factor, multiple sites: Secondary | ICD-10-CM | POA: Diagnosis not present

## 2024-05-10 DIAGNOSIS — E1169 Type 2 diabetes mellitus with other specified complication: Secondary | ICD-10-CM | POA: Diagnosis not present

## 2024-05-10 DIAGNOSIS — J0110 Acute frontal sinusitis, unspecified: Secondary | ICD-10-CM | POA: Diagnosis not present

## 2024-05-10 DIAGNOSIS — E785 Hyperlipidemia, unspecified: Secondary | ICD-10-CM

## 2024-05-10 DIAGNOSIS — E1159 Type 2 diabetes mellitus with other circulatory complications: Secondary | ICD-10-CM

## 2024-05-10 DIAGNOSIS — I152 Hypertension secondary to endocrine disorders: Secondary | ICD-10-CM | POA: Diagnosis not present

## 2024-05-10 DIAGNOSIS — Z7984 Long term (current) use of oral hypoglycemic drugs: Secondary | ICD-10-CM | POA: Diagnosis not present

## 2024-05-10 DIAGNOSIS — E1140 Type 2 diabetes mellitus with diabetic neuropathy, unspecified: Secondary | ICD-10-CM

## 2024-05-10 DIAGNOSIS — I700 Atherosclerosis of aorta: Secondary | ICD-10-CM

## 2024-05-10 DIAGNOSIS — E119 Type 2 diabetes mellitus without complications: Secondary | ICD-10-CM

## 2024-05-10 MED ORDER — CARVEDILOL 6.25 MG PO TABS: 6.2500 mg | ORAL_TABLET | Freq: Two times a day (BID) | ORAL | 3 refills | Status: AC

## 2024-05-10 MED ORDER — METFORMIN HCL 500 MG PO TABS: ORAL_TABLET | ORAL | 4 refills | Status: AC

## 2024-05-10 MED ORDER — AMOXICILLIN-POT CLAVULANATE 875-125 MG PO TABS: 1.0000 | ORAL_TABLET | Freq: Two times a day (BID) | ORAL | 0 refills | Status: AC

## 2024-05-10 MED ORDER — DULOXETINE HCL 60 MG PO CPEP: 60.0000 mg | ORAL_CAPSULE | Freq: Every day | ORAL | 4 refills | Status: AC

## 2024-05-10 MED ORDER — ATORVASTATIN CALCIUM 20 MG PO TABS: ORAL_TABLET | ORAL | 3 refills | Status: AC

## 2024-05-10 MED ORDER — GABAPENTIN 600 MG PO TABS: ORAL_TABLET | ORAL | 3 refills | Status: AC

## 2024-05-10 MED ORDER — GLIMEPIRIDE 1 MG PO TABS: 0.5000 mg | ORAL_TABLET | Freq: Every day | ORAL | 3 refills | Status: AC

## 2024-05-10 MED ORDER — SITAGLIPTIN PHOSPHATE 100 MG PO TABS: 100.0000 mg | ORAL_TABLET | Freq: Every day | ORAL | 3 refills | Status: AC

## 2024-05-10 NOTE — Assessment & Plan Note (Signed)
Followed by urology, continue this collaboration.  Recent notes reviewed.

## 2024-05-10 NOTE — Assessment & Plan Note (Signed)
 Acute for over one week. Will start Augmentin  BID for 7 days. Recommend: - Increased rest - Increasing Fluids - Acetaminophen   as needed for fever/pain.  - Salt water gargling, chloraseptic spray and throat lozenges - Mucinex.  - Saline sinus flushes or a neti pot.  - Humidifying the air.

## 2024-05-10 NOTE — Assessment & Plan Note (Signed)
 Chronic, ongoing.  Continue current medication regimen and adjust as needed. Lipid panel today.

## 2024-05-10 NOTE — Assessment & Plan Note (Signed)
 Chronic, stable.  BP at goal for age in office today on recheck, avoid hypotension due to age.  Recommend he monitor BP at home at least three mornings a week + focus on DASH diet.  Continue current medication regimen and adjust as needed.  Labs: A1c, CMP.  Urine ALB 80 January 2026.  May benefit addition of ACE or ARB in future and transition off BB.

## 2024-05-10 NOTE — Assessment & Plan Note (Signed)
 Refer to diabetes with neuropathy plan of care

## 2024-05-10 NOTE — Assessment & Plan Note (Signed)
Continue collaboration with urology.  Recent note reviewed.  PSA monitored by them.

## 2024-05-10 NOTE — Assessment & Plan Note (Signed)
 Chronic, ongoing.  Followed by rheumatology with improved functional status.  Continue this collaboration and current regimen as prescribed by them.  Recent note reviewed.  Discussed that he can take occasional Tylenol  as needed for back pain, max dosing 4000 MG daily.

## 2024-05-10 NOTE — Assessment & Plan Note (Signed)
 Chronic, ongoing in patient with history of renal cell CA.  A1c today 6.5%, remains at goal.  Did not tolerate Trulicity , GI issues.  Farxiga  tried with GI issues presenting. Urine ALB 80 January 2026. - Continue Metformin  1000 MG BID, Januvia  100 MG daily, and low dose Amaryl , which he has taken in past -- 0.5 MG and ensure no frequent BS <70, he is aware to notify provider if this presents.  Goal to avoid insulin  if possible. - Monitor BS daily in morning at home.  - Eye exam up to date.  Foot exam up to date - Consider change from BB to ACE/ARB in future.  Statin on board. - Return in 3 months, sooner if needed dependent on labs.

## 2024-05-10 NOTE — Assessment & Plan Note (Signed)
Ongoing.  Noted on CXR 07/20/19.  Continue statin and ASA daily for prevention.  Monitor closely.  Educated patient on this. 

## 2024-05-10 NOTE — Progress Notes (Signed)
 "  BP 127/75 (BP Location: Left Arm, Patient Position: Sitting, Cuff Size: Normal)   Pulse 81   Temp 98.1 F (36.7 C) (Oral)   Resp 17   Ht 5' 9.02 (1.753 m)   Wt 205 lb (93 kg)   SpO2 100%   BMI 30.26 kg/m    Subjective:    Patient ID: Jason Contreras, male    DOB: [birth year], 84 y.o.   MRN: [id number]  HPI: DIMITRIS SHANAHAN is a 84 y.o. male  Chief Complaint  Patient presents with   Hypertension   Hyperlipidemia   Diabetes   DIABETES August 6.4% A1c.  Takes Metformin  1000 MG BID, Glimepiride  0.5 MG daily, and Januvia  100 MG daily. Continues on Gabapentin  600 MG TID and B12 for neuropathy + Vitamin D . Has had poor diet over the holidays.  Has been feeling bad with upper respiratory symptoms for over one week.  Hypoglycemic episodes:no Polydipsia/polyuria: no Visual disturbance: no Chest pain: no Paresthesias: no Glucose Monitoring: not checking  Accucheck frequency:   Fasting glucose:   Post prandial:  Evening:  Before meals: Taking Insulin ?: no  Long acting insulin :  Short acting insulin : Blood Pressure Monitoring: a few times a week Retinal Examination: Up to Date Foot Exam: Up to Date Pneumovax: Not up to Date Influenza: Up to Date Aspirin: no   HYPERTENSION / HYPERLIPIDEMIA Taking Atorvastatin  20 MG and Coreg  6.25 MG BID.  Aortic atherosclerosis noted on CXR 07/20/19. Satisfied with current treatment? yes Duration of hypertension: chronic BP monitoring frequency: a few times a week BP range:  BP medication side effects: no Duration of hyperlipidemia: chronic Cholesterol medication side effects: no Cholesterol supplements: none Medication compliance: good compliance Aspirin: no Recent stressors: no Recurrent headaches: no Visual changes: no Palpitations: no Dyspnea: no Chest pain: no Lower extremity edema: no Dizzy/lightheaded: has occasional dizzy spells when looking upwards  RHEUMATOID ARTHRITIS Rheumatology for RA of multiple sites with  negative Rf. Last visit with them was on 02/02/24.  Continues on Methotrexate  which offers benefit. Pain control status: improved Duration: chronic Location: multiple areas What Activities task can be accomplished with current medication? ADL's daily Previous pain specialty evaluation: yes Non-narcotic analgesic meds: yes  HISTORY OF RENAL CANCER & ELEVATED PSA Follows with urology, last visit 09/17/23. Nocturia: no Urinary frequency:no Incomplete voiding: no Urgency: no Weak urinary stream: no Straining to start stream: no Dysuria: no Onset: gradual Severity: mild    Relevant past medical, surgical, family and social history reviewed and updated as indicated. Interim medical history since our last visit reviewed. Allergies and medications reviewed and updated.  Review of Systems  Constitutional:  Positive for chills and fatigue. Negative for activity change, diaphoresis and fever (maybe one day).  HENT:  Positive for congestion, postnasal drip, rhinorrhea, sinus pressure and sore throat. Negative for ear discharge, sinus pain and sneezing.   Respiratory:  Positive for cough. Negative for chest tightness, shortness of breath and wheezing.   Cardiovascular:  Negative for chest pain, palpitations and leg swelling.  Gastrointestinal: Negative.   Endocrine: Negative for polydipsia, polyphagia and polyuria.  Neurological:  Positive for headaches. Negative for dizziness, syncope, weakness and numbness.  Psychiatric/Behavioral: Negative.      Per HPI unless specifically indicated above     Objective:    BP 127/75 (BP Location: Left Arm, Patient Position: Sitting, Cuff Size: Normal)   Pulse 81   Temp 98.1 F (36.7 C) (Oral)   Resp 17   Ht 5' 9.02 (1.753 m)  Wt 205 lb (93 kg)   SpO2 100%   BMI 30.26 kg/m   Wt Readings from Last 3 Encounters:  05/10/24 205 lb (93 kg)  12/29/23 206 lb (93.4 kg)  09/24/23 200 lb (90.7 kg)    Physical Exam Vitals and nursing note reviewed.   Constitutional:      General: He is awake. He is not in acute distress.    Appearance: He is well-developed and well-groomed. He is obese. He is not ill-appearing or toxic-appearing.  HENT:     Head: Normocephalic and atraumatic.     Right Ear: Hearing, ear canal and external ear normal. No drainage. A middle ear effusion is present. There is no impacted cerumen. Tympanic membrane is not injected.     Left Ear: Hearing, ear canal and external ear normal. No drainage. A middle ear effusion is present. There is no impacted cerumen. Tympanic membrane is not injected.     Nose: Rhinorrhea present. Rhinorrhea is clear.     Right Sinus: Frontal sinus tenderness present. No maxillary sinus tenderness.     Left Sinus: Frontal sinus tenderness present. No maxillary sinus tenderness.     Mouth/Throat:     Mouth: Mucous membranes are moist.     Pharynx: Posterior oropharyngeal erythema (mild) present. No pharyngeal swelling or oropharyngeal exudate.  Eyes:     General: Lids are normal.        Right eye: No discharge.        Left eye: No discharge.     Conjunctiva/sclera: Conjunctivae normal.     Pupils: Pupils are equal, round, and reactive to light.  Neck:     Thyroid : No thyromegaly.     Vascular: No carotid bruit or JVD.     Trachea: Trachea normal.  Cardiovascular:     Rate and Rhythm: Normal rate and regular rhythm.     Heart sounds: Normal heart sounds, S1 normal and S2 normal. No murmur heard.    No gallop.  Pulmonary:     Effort: Pulmonary effort is normal. No accessory muscle usage or respiratory distress.     Breath sounds: Normal breath sounds. No decreased breath sounds, wheezing or rales.  Abdominal:     General: Bowel sounds are normal. There is no distension.     Palpations: Abdomen is soft. There is no hepatomegaly.     Tenderness: There is no abdominal tenderness. There is no right CVA tenderness or left CVA tenderness.     Hernia: No hernia is present.  Musculoskeletal:         General: Normal range of motion.     Cervical back: Normal range of motion and neck supple.     Right lower leg: No edema.     Left lower leg: No edema.  Skin:    General: Skin is warm and dry.     Capillary Refill: Capillary refill takes less than 2 seconds.     Comments: Scattered pale purple/yellow bruises noted to BUE.  Neurological:     Mental Status: He is alert and oriented to person, place, and time.     Coordination: Coordination is intact.     Gait: Gait is intact.     Deep Tendon Reflexes: Reflexes are normal and symmetric.     Reflex Scores:      Brachioradialis reflexes are 2+ on the right side and 2+ on the left side.      Patellar reflexes are 2+ on the right side and 2+ on the  left side. Psychiatric:        Mood and Affect: Mood normal.        Behavior: Behavior normal. Behavior is cooperative.        Thought Content: Thought content normal.        Judgment: Judgment normal.     Results for orders placed or performed in visit on 12/29/23  Bayer DCA Hb A1c Waived   Collection Time: 12/29/23  3:31 PM  Result Value Ref Range   HB A1C (BAYER DCA - WAIVED) 6.4 (H) 4.8 - 5.6 %  Comprehensive metabolic panel with GFR   Collection Time: 12/29/23  3:31 PM  Result Value Ref Range   Glucose 150 (H) 70 - 99 mg/dL   BUN 14 8 - 27 mg/dL   Creatinine, Ser 9.09 0.76 - 1.27 mg/dL   eGFR 86 >40 fO/fpw/8.26   BUN/Creatinine Ratio 16 10 - 24   Sodium 140 134 - 144 mmol/L   Potassium 4.5 3.5 - 5.2 mmol/L   Chloride 101 96 - 106 mmol/L   CO2 24 20 - 29 mmol/L   Calcium  9.0 8.6 - 10.2 mg/dL   Total Protein 5.8 (L) 6.0 - 8.5 g/dL   Albumin 4.1 3.8 - 4.8 g/dL   Globulin, Total 1.7 1.5 - 4.5 g/dL   Bilirubin Total 0.5 0.0 - 1.2 mg/dL   Alkaline Phosphatase 74 44 - 121 IU/L   AST 21 0 - 40 IU/L   ALT 18 0 - 44 IU/L  Lipid Panel w/o Chol/HDL Ratio   Collection Time: 12/29/23  3:31 PM  Result Value Ref Range   Cholesterol, Total 119 100 - 199 mg/dL   Triglycerides 841 (H)  0 - 149 mg/dL   HDL 43 >60 mg/dL   VLDL Cholesterol Cal 27 5 - 40 mg/dL   LDL Chol Calc (NIH) 49 0 - 99 mg/dL      Assessment & Plan:   Problem List Items Addressed This Visit       Cardiovascular and Mediastinum   Hypertension associated with diabetes (HCC)   Chronic, stable.  BP at goal for age in office today on recheck, avoid hypotension due to age.  Recommend he monitor BP at home at least three mornings a week + focus on DASH diet.  Continue current medication regimen and adjust as needed.  Labs: A1c, CMP.  Urine ALB 80 January 2026.  May benefit addition of ACE or ARB in future and transition off BB.      Relevant Medications   atorvastatin  (LIPITOR) 20 MG tablet   carvedilol  (COREG ) 6.25 MG tablet   glimepiride  (AMARYL ) 1 MG tablet   metFORMIN  (GLUCOPHAGE ) 500 MG tablet   sitaGLIPtin  (JANUVIA ) 100 MG tablet   Other Relevant Orders   Bayer DCA Hb A1c Waived   Aortic atherosclerosis   Ongoing.  Noted on CXR 07/20/19.  Continue statin and ASA daily for prevention.  Monitor closely.  Educated patient on this.      Relevant Medications   atorvastatin  (LIPITOR) 20 MG tablet   carvedilol  (COREG ) 6.25 MG tablet     Respiratory   Acute frontal sinusitis   Acute for over one week. Will start Augmentin  BID for 7 days. Recommend: - Increased rest - Increasing Fluids - Acetaminophen   as needed for fever/pain.  - Salt water gargling, chloraseptic spray and throat lozenges - Mucinex.  - Saline sinus flushes or a neti pot.  - Humidifying the air.       Relevant Medications  amoxicillin -clavulanate (AUGMENTIN ) 875-125 MG tablet     Endocrine   Type 2 diabetes, controlled, with neuropathy (HCC) - Primary   Chronic, ongoing in patient with history of renal cell CA.  A1c today 6.5%, remains at goal.  Did not tolerate Trulicity , GI issues.  Farxiga  tried with GI issues presenting. Urine ALB 80 January 2026. - Continue Metformin  1000 MG BID, Januvia  100 MG daily, and low dose  Amaryl , which he has taken in past -- 0.5 MG and ensure no frequent BS <70, he is aware to notify provider if this presents.  Goal to avoid insulin  if possible. - Monitor BS daily in morning at home.  - Eye exam up to date.  Foot exam up to date - Consider change from BB to ACE/ARB in future.  Statin on board. - Return in 3 months, sooner if needed dependent on labs.      Relevant Medications   atorvastatin  (LIPITOR) 20 MG tablet   glimepiride  (AMARYL ) 1 MG tablet   metFORMIN  (GLUCOPHAGE ) 500 MG tablet   sitaGLIPtin  (JANUVIA ) 100 MG tablet   Other Relevant Orders   Bayer DCA Hb A1c Waived   Microalbumin, Urine Waived   Hyperlipidemia associated with type 2 diabetes mellitus (HCC)   Chronic, ongoing.  Continue current medication regimen and adjust as needed.  Lipid panel today.      Relevant Medications   atorvastatin  (LIPITOR) 20 MG tablet   carvedilol  (COREG ) 6.25 MG tablet   glimepiride  (AMARYL ) 1 MG tablet   metFORMIN  (GLUCOPHAGE ) 500 MG tablet   sitaGLIPtin  (JANUVIA ) 100 MG tablet   Other Relevant Orders   Comprehensive metabolic panel with GFR   Lipid Panel w/o Chol/HDL Ratio   Diabetes mellitus treated with oral medication (HCC)   Refer to diabetes with neuropathy plan of care.      Relevant Medications   atorvastatin  (LIPITOR) 20 MG tablet   glimepiride  (AMARYL ) 1 MG tablet   metFORMIN  (GLUCOPHAGE ) 500 MG tablet   sitaGLIPtin  (JANUVIA ) 100 MG tablet   Other Relevant Orders   Bayer DCA Hb A1c Waived     Musculoskeletal and Integument   Rheumatoid arthritis of multiple sites with negative rheumatoid factor (HCC)   Chronic, ongoing.  Followed by rheumatology with improved functional status.  Continue this collaboration and current regimen as prescribed by them.  Recent note reviewed.  Discussed that he can take occasional Tylenol  as needed for back pain, max dosing 4000 MG daily.        Other   History of renal cell cancer   Followed by urology, continue this  collaboration.  Recent notes reviewed.      Elevated PSA   Continue collaboration with urology.  Recent note reviewed.  PSA monitored by them.          Follow up plan: Return in about 3 months (around 08/08/2024) for Annual Physical.      "

## 2024-05-11 ENCOUNTER — Ambulatory Visit: Payer: Self-pay | Admitting: Nurse Practitioner

## 2024-05-11 LAB — COMPREHENSIVE METABOLIC PANEL WITH GFR
ALT: 14 IU/L (ref 0–44)
AST: 15 IU/L (ref 0–40)
Albumin: 4 g/dL (ref 3.7–4.7)
Alkaline Phosphatase: 87 IU/L (ref 48–129)
BUN/Creatinine Ratio: 13 (ref 10–24)
BUN: 9 mg/dL (ref 8–27)
Bilirubin Total: 0.6 mg/dL (ref 0.0–1.2)
CO2: 26 mmol/L (ref 20–29)
Calcium: 9.2 mg/dL (ref 8.6–10.2)
Chloride: 98 mmol/L (ref 96–106)
Creatinine, Ser: 0.71 mg/dL — ABNORMAL LOW (ref 0.76–1.27)
Globulin, Total: 2 g/dL (ref 1.5–4.5)
Glucose: 188 mg/dL — ABNORMAL HIGH (ref 70–99)
Potassium: 4.8 mmol/L (ref 3.5–5.2)
Sodium: 137 mmol/L (ref 134–144)
Total Protein: 6 g/dL (ref 6.0–8.5)
eGFR: 92 mL/min/1.73

## 2024-05-11 LAB — BAYER DCA HB A1C WAIVED: HB A1C (BAYER DCA - WAIVED): 6.5 % — ABNORMAL HIGH (ref 4.8–5.6)

## 2024-05-11 LAB — MICROALBUMIN, URINE WAIVED
Creatinine, Urine Waived: 50 mg/dL (ref 10–300)
Microalb, Ur Waived: 30 mg/L — ABNORMAL HIGH (ref 0–19)

## 2024-05-11 LAB — LIPID PANEL W/O CHOL/HDL RATIO
Cholesterol, Total: 113 mg/dL (ref 100–199)
HDL: 38 mg/dL — ABNORMAL LOW
LDL Chol Calc (NIH): 53 mg/dL (ref 0–99)
Triglycerides: 122 mg/dL (ref 0–149)
VLDL Cholesterol Cal: 22 mg/dL (ref 5–40)

## 2024-05-11 NOTE — Progress Notes (Signed)
 Contacted via MyChart  Good morning Jason Contreras, your labs have returned and overall remain stable. Kidney function, creatinine and eGFR, remains normal, as is liver function, AST and ALT. No medication changes needed. Any questions? Keep being amazing!!  Thank you for allowing me to participate in your care.  I appreciate you. Kindest regards, Veronnica Hennings

## 2024-05-20 ENCOUNTER — Ambulatory Visit

## 2024-06-10 ENCOUNTER — Ambulatory Visit: Admitting: Emergency Medicine

## 2024-06-10 VITALS — BP 138/88 | Ht 69.5 in | Wt 201.4 lb

## 2024-06-10 DIAGNOSIS — Z23 Encounter for immunization: Secondary | ICD-10-CM

## 2024-06-10 DIAGNOSIS — Z0000 Encounter for general adult medical examination without abnormal findings: Secondary | ICD-10-CM

## 2024-06-10 NOTE — Progress Notes (Signed)
 "  Chief Complaint  Patient presents with   Medicare Wellness     Subjective:   Jason Contreras is a 84 y.o. male who presents for a Medicare Annual Wellness Visit.  Visit info / Clinical Intake: Medicare Wellness Visit Type:: Subsequent Annual Wellness Visit Persons participating in visit and providing information:: patient Medicare Wellness Visit Mode:: In-person (required for WTM) Interpreter Needed?: No Pre-visit prep was completed: yes AWV questionnaire completed by patient prior to visit?: yes Date:: 06/09/24 Living arrangements:: lives with spouse/significant other Patient's Overall Health Status Rating: very good Typical amount of pain: some Does pain affect daily life?: no Are you currently prescribed opioids?: no  Dietary Habits and Nutritional Risks How many meals a day?: 3 Eats fruit and vegetables daily?: yes Most meals are obtained by: preparing own meals; eating out; having others provide food In the last 2 weeks, have you had any of the following?: none Diabetic:: (!) yes Any non-healing wounds?: no How often do you check your BS?: 0 Would you like to be referred to a Nutritionist or for Diabetic Management? : no  Functional Status Activities of Daily Living (to include ambulation/medication): Independent Ambulation: Independent with device- listed below Home Assistive Devices/Equipment: Eyeglasses Medication Administration: Independent Home Management (perform basic housework or laundry): Independent Manage your own finances?: yes Primary transportation is: driving Concerns about vision?: no *vision screening is required for WTM* Concerns about hearing?: no  Fall Screening Falls in the past year?: 0 Number of falls in past year: 0 Was there an injury with Fall?: 0 Fall Risk Category Calculator: 0 Patient Fall Risk Level: Low Fall Risk  Fall Risk Patient at Risk for Falls Due to: No Fall Risks Fall risk Follow up: Falls evaluation completed  Home  and Transportation Safety: All rugs have non-skid backing?: yes All stairs or steps have railings?: yes Grab bars in the bathtub or shower?: yes Have non-skid surface in bathtub or shower?: yes Good home lighting?: yes Regular seat belt use?: yes Hospital stays in the last year:: no  Cognitive Assessment Difficulty concentrating, remembering, or making decisions? : no Will 6CIT or Mini Cog be Completed: yes What year is it?: 0 points What month is it?: 0 points Give patient an address phrase to remember (5 components): [address] [place] About what time is it?: 3 points (3:00) Count backwards from 20 to 1: 0 points Say the months of the year in reverse: 0 points Repeat the address phrase from earlier: 0 points 6 CIT Score: 3 points  Advance Directives (For Healthcare) Does Patient Have a Medical Advance Directive?: Yes Does patient want to make changes to medical advance directive?: No - Patient declined Type of Advance Directive: Healthcare Power of [place]; Living will Copy of Healthcare Power of Attorney in Chart?: No - copy requested Copy of Living Will in Chart?: No - copy requested  Reviewed/Updated  Reviewed/Updated: Reviewed All (Medical, Surgical, Family, Medications, Allergies, Care Teams, Patient Goals)    Allergies (verified) Dapagliflozin , Morphine , and Trulicity  [dulaglutide ]   Current Medications (verified) Outpatient Encounter Medications as of 06/10/2024  Medication Sig   acetaminophen  (TYLENOL ) 325 MG tablet Take 650 mg by mouth every 6 (six) hours as needed. Takes at bedtime and occasionally in am for back/hip pain   atorvastatin  (LIPITOR) 20 MG tablet TAKE 1 TABLET BY MOUTH  DAILY AT 6 PM.   B Complex-C-Folic Acid  (SUPER B COMPLEX/FA/VIT C PO) Take 1 tablet by mouth daily. Contains 400 mcg folic acid   carvedilol  (COREG ) 6.25 MG tablet Take 1 tablet (6.25 mg total) by mouth 2 (two) times daily.   cholecalciferol  (VITAMIN D3) 25 MCG (1000 UNIT) tablet  Take 2,000 Units by mouth daily.    DULoxetine  (CYMBALTA ) 60 MG capsule Take 1 capsule (60 mg total) by mouth daily.   finasteride  (PROSCAR ) 5 MG tablet Take 1 tablet (5 mg total) by mouth daily.   folic acid  (FOLVITE ) 1 MG tablet Take 1 tablet (1 mg total) by mouth daily.   gabapentin  (NEURONTIN ) 600 MG tablet Take 600 MG (1 tablet) by mouth at 0800 and 1300 hours, then before bedtime take 900 MG (1 1/2 tablets) by mouth. (Patient taking differently: Take 600 MG (1 tablet) by mouth at 0800 and 1300 hours, then before bedtime take 900 MG (1 1/2 tablets) by mouth.  Taking 1 tablet in the morning and 1 tablet at supper)   glimepiride  (AMARYL ) 1 MG tablet Take 0.5 tablets (0.5 mg total) by mouth daily with breakfast.   lidocaine  (LMX) 4 % cream Apply 1 application topically as needed.   metFORMIN  (GLUCOPHAGE ) 500 MG tablet TAKE 2 TABLETS BY MOUTH  TWICE DAILY   methotrexate  (RHEUMATREX) 2.5 MG tablet Take 6 of the 2.5 MG tablets by mouth once a week per rheumatology instruction.  Caution:Chemotherapy. Protect from light.   sitaGLIPtin  (JANUVIA ) 100 MG tablet Take 1 tablet (100 mg total) by mouth daily.   tamsulosin  (FLOMAX ) 0.4 MG CAPS capsule Take 1 capsule (0.4 mg total) by mouth daily.   vitamin C (ASCORBIC ACID) 500 MG tablet Take 500 mg by mouth daily.   vitamin E 200 UNIT capsule Take 200 Units by mouth daily.   mometasone  (ELOCON ) 0.1 % cream APPLY  CREAM TOPICALLY ONCE DAILY (Patient not taking: Reported on 06/10/2024)   triamcinolone  cream (KENALOG ) 0.1 % Apply 1 Application topically 2 (two) times daily. (Patient not taking: Reported on 06/10/2024)   No facility-administered encounter medications on file as of 06/10/2024.    History: Past Medical History:  Diagnosis Date   Adenocarcinoma, renal cell (HCC)    Arthritis    Collagen vascular disease    Diabetes mellitus without complication (HCC)    Elevated PSA    Headache    Hematuria    Hyperlipidemia    Hypertension    Neuropathy     Obesity    PONV (postoperative nausea and vomiting)    Renal insufficiency    Right renal mass    Past Surgical History:  Procedure Laterality Date   APPENDECTOMY     CRYOABLATION  10/17/2017   IR RADIOLOGIST EVAL & MGMT  07/17/2017   IR RADIOLOGIST EVAL & MGMT  08/13/2017   IR RADIOLOGIST EVAL & MGMT  11/12/2017   IR RADIOLOGIST EVAL & MGMT  03/05/2018   RADIOLOGY WITH ANESTHESIA Right 10/17/2017   Procedure: CT WITH ANESTHESIA RENAL CRYOABLATION;  Surgeon: Vanice Sharper, MD;  Location: [facility];  Service: Anesthesiology;  Laterality: Right;   ROBOTIC ASSITED PARTIAL NEPHRECTOMY Right 12/12/2015   Procedure: ROBOTIC ASSITED PARTIAL NEPHRECTOMY;  Surgeon: Rosina Riis, MD;  Location: [facility];  Service: Urology;  Laterality: Right;   Family History  Problem Relation Age of Onset   Bone cancer Mother    Cancer Mother    Cancer Father    Stroke Brother    Kidney disease Neg Hx    Social History   Occupational History   Not on file  Tobacco Use   Smoking status: Never   Smokeless tobacco: Never  Vaping Use   Vaping status: Never Used  Substance and Sexual Activity   Alcohol use: Never   Drug use: Never   Sexual activity: Not Currently   Tobacco Counseling Counseling given: Not Answered  SDOH Screenings   Food Insecurity: No Food Insecurity (06/10/2024)  Housing: Low Risk (06/10/2024)  Transportation Needs: No Transportation Needs (06/10/2024)  Utilities: Not At Risk (06/10/2024)  Alcohol Screen: Low Risk (03/04/2023)  Depression (PHQ2-9): Low Risk (06/10/2024)  Financial Resource Strain: Low Risk (05/09/2024)  Physical Activity: Inactive (06/10/2024)  Social Connections: Moderately Integrated (06/10/2024)  Recent Concern: Social Connections - Moderately Isolated (05/09/2024)  Stress: No Stress Concern Present (06/10/2024)  Tobacco Use: Low Risk (06/10/2024)  Health Literacy: Adequate Health Literacy (06/10/2024)   See flowsheets for full screening details  Depression Screen PHQ 2 & 9  Depression Scale- Over the past 2 weeks, how often have you been bothered by any of the following problems? Little interest or pleasure in doing things: 0 Feeling down, depressed, or hopeless (PHQ Adolescent also includes...irritable): 0 PHQ-2 Total Score: 0 Trouble falling or staying asleep, or sleeping too much: 0 Feeling tired or having little energy: 0 Poor appetite or overeating (PHQ Adolescent also includes...weight loss): 0 Feeling bad about yourself - or that you are a failure or have let yourself or your family down: 0 Trouble concentrating on things, such as reading the newspaper or watching television (PHQ Adolescent also includes...like school work): 0 Moving or speaking so slowly that other people could have noticed. Or the opposite - being so fidgety or restless that you have been moving around a lot more than usual: 0 Thoughts that you would be better off dead, or of hurting yourself in some way: 0 PHQ-9 Total Score: 0 If you checked off any problems, how difficult have these problems made it for you to do your work, take care of things at home, or get along with other people?: Not difficult at all  Depression Treatment Depression Interventions/Treatment : EYV7-0 Score <4 Follow-up Not Indicated     Goals Addressed               This Visit's Progress     Patient Stated (pt-stated)   Not on track     Continue to stay active and eat healthy             Objective:    Today's Vitals   06/10/24 1343  BP: 138/88  Weight: 201 lb 6.4 oz (91.4 kg)  Height: 5' 9.5 (1.765 m)   Body mass index is 29.32 kg/m.  Hearing/Vision screen Hearing Screening - Comments:: Denies hearing loss  Vision Screening - Comments:: Needs DM eye exam. Patient to call Dr. Adine Novak office to schedule appointments Immunizations and Health Maintenance Health Maintenance  Topic Date Due   COVID-19 Vaccine (5 - 2025-26 season) 01/05/2024   OPHTHALMOLOGY EXAM  02/12/2024   FOOT EXAM   06/23/2024   HEMOGLOBIN A1C  11/07/2024   Diabetic kidney evaluation - eGFR measurement  05/10/2025   Diabetic kidney evaluation - Urine ACR  05/10/2025   Medicare Annual Wellness (AWV)  06/10/2025   DTaP/Tdap/Td (3 - Tdap) 09/04/2031   Pneumococcal Vaccine: 50+ Years  Completed   Influenza Vaccine  Completed   Zoster Vaccines- Shingrix  Completed   Meningococcal B Vaccine  Aged Out   Colonoscopy  Discontinued   Hepatitis C Screening  Discontinued        Assessment/Plan:  This is a routine wellness examination for [employer].  Patient Care Team: Valerio Melanie DASEN, NP as PCP - General (Nurse Practitioner) Myrna Adine Anes, MD as Consulting Physician (Ophthalmology) Tobie Lady POUR, MD as Consulting Physician (Rheumatology) Helon Clotilda LABOR, PA-C as Physician Assistant (Urology)  I have personally reviewed and noted the following in the patients chart:   Medical and social history Use of alcohol, tobacco or illicit drugs  Current medications and supplements including opioid prescriptions. Functional ability and status Nutritional status Physical activity Advanced directives List of other physicians Hospitalizations, surgeries, and ER visits in previous 12 months Vitals Screenings to include cognitive, depression, and falls Referrals and appointments  Orders Placed This Encounter  Procedures   Flu vaccine HIGH DOSE PF(Fluzone Trivalent)   In addition, I have reviewed and discussed with patient certain preventive protocols, quality metrics, and best practice recommendations. A written personalized care plan for preventive services as well as general preventive health recommendations were provided to patient.   Vina Ned, CMA   06/10/2024   Return 06/17/15 @ 1:50pm, for In Person [employer]  Visit.  After Visit Summary: (In Person-Declined) Patient declined AVS at this time.  Nurse Notes:  6 CIT Score - 3 Needs DM eye exam. Patient to call and  schedule Gave the flu vaccine today Declined DM & Nutrition education referral Screening colonoscopy no longer recommended due to age.  "

## 2024-06-10 NOTE — Patient Instructions (Signed)
 Jason Contreras,  Thank you for taking the time for your Medicare Wellness Visit. I appreciate your continued commitment to your health goals. Please review the care plan we discussed, and feel free to reach out if I can assist you further.  Please note that Annual Wellness Visits do not include a physical exam. Some assessments may be limited, especially if the visit was conducted virtually. If needed, we may recommend an in-person follow-up with your provider.  Ongoing Care Seeing your primary care provider every 3 to 6 months helps us  monitor your health and provide consistent, personalized care.    Referrals If a referral was made during today's visit and you haven't received any updates within two weeks, please contact the referred provider directly to check on the status.  Recommended Screenings:  You need a diabetic eye exam every year. Please call and schedule at your earliest convenience.  I have give you the flu vaccine today.   Health Maintenance  Topic Date Due   COVID-19 Vaccine (5 - 2025-26 season) 01/05/2024   Eye exam for diabetics  02/12/2024   Medicare Annual Wellness Visit  03/03/2024   Complete foot exam   06/23/2024   Hemoglobin A1C  11/07/2024   Yearly kidney function blood test for diabetes  05/10/2025   Kidney health urinalysis for diabetes  05/10/2025   DTaP/Tdap/Td vaccine (3 - Tdap) 09/04/2031   Pneumococcal Vaccine for age over 40  Completed   Flu Shot  Completed   Zoster (Shingles) Vaccine  Completed   Meningitis B Vaccine  Aged Out   Colon Cancer Screening  Discontinued   Hepatitis C Screening  Discontinued       06/10/2024    1:51 PM  Advanced Directives  Does Patient Have a Medical Advance Directive? Yes  Type of Estate Agent of [place];Living will  Does patient want to make changes to medical advance directive? No - Patient declined  Copy of Healthcare Power of Attorney in Chart? No - copy requested    Vision: Annual  vision screenings are recommended for early detection of glaucoma, cataracts, and diabetic retinopathy. These exams can also reveal signs of chronic conditions such as diabetes and high blood pressure.  Dental: Annual dental screenings help detect early signs of oral cancer, gum disease, and other conditions linked to overall health, including heart disease and diabetes.  Please see the attached documents for additional preventive care recommendations.

## 2024-08-10 ENCOUNTER — Encounter: Admitting: Nurse Practitioner

## 2024-09-08 ENCOUNTER — Other Ambulatory Visit

## 2024-09-15 ENCOUNTER — Ambulatory Visit: Admitting: Urology

## 2025-06-16 ENCOUNTER — Ambulatory Visit
